# Patient Record
Sex: Male | Born: 1950
Health system: Southern US, Community
[De-identification: ages and names within clinical notes are randomized; demographics above are authoritative.]

## PROBLEM LIST (undated history)

## (undated) DIAGNOSIS — G4700 Insomnia, unspecified: Secondary | ICD-10-CM

## (undated) DIAGNOSIS — G4733 Obstructive sleep apnea (adult) (pediatric): Secondary | ICD-10-CM

## (undated) DIAGNOSIS — C61 Malignant neoplasm of prostate: Secondary | ICD-10-CM

## (undated) DIAGNOSIS — J418 Mixed simple and mucopurulent chronic bronchitis: Secondary | ICD-10-CM

## (undated) DIAGNOSIS — J449 Chronic obstructive pulmonary disease, unspecified: Secondary | ICD-10-CM

## (undated) DIAGNOSIS — G250 Essential tremor: Secondary | ICD-10-CM

## (undated) DIAGNOSIS — C801 Malignant (primary) neoplasm, unspecified: Secondary | ICD-10-CM

## (undated) DIAGNOSIS — N401 Enlarged prostate with lower urinary tract symptoms: Secondary | ICD-10-CM

## (undated) DIAGNOSIS — Z972 Presence of dental prosthetic device (complete) (partial): Secondary | ICD-10-CM

## (undated) DIAGNOSIS — J432 Centrilobular emphysema: Secondary | ICD-10-CM

## (undated) DIAGNOSIS — J189 Pneumonia, unspecified organism: Secondary | ICD-10-CM

## (undated) DIAGNOSIS — J3089 Other allergic rhinitis: Secondary | ICD-10-CM

## (undated) DIAGNOSIS — J4489 Other specified chronic obstructive pulmonary disease: Secondary | ICD-10-CM

## (undated) DIAGNOSIS — G5601 Carpal tunnel syndrome, right upper limb: Secondary | ICD-10-CM

## (undated) DIAGNOSIS — R0600 Dyspnea, unspecified: Secondary | ICD-10-CM

## (undated) DIAGNOSIS — I8390 Asymptomatic varicose veins of unspecified lower extremity: Secondary | ICD-10-CM

## (undated) DIAGNOSIS — K219 Gastro-esophageal reflux disease without esophagitis: Secondary | ICD-10-CM

## (undated) DIAGNOSIS — R0609 Other forms of dyspnea: Secondary | ICD-10-CM

## (undated) DIAGNOSIS — H269 Unspecified cataract: Secondary | ICD-10-CM

## (undated) DIAGNOSIS — J069 Acute upper respiratory infection, unspecified: Secondary | ICD-10-CM

## (undated) DIAGNOSIS — M1990 Unspecified osteoarthritis, unspecified site: Secondary | ICD-10-CM

## (undated) DIAGNOSIS — F419 Anxiety disorder, unspecified: Secondary | ICD-10-CM

## (undated) DIAGNOSIS — R918 Other nonspecific abnormal finding of lung field: Secondary | ICD-10-CM

## (undated) DIAGNOSIS — N138 Other obstructive and reflux uropathy: Secondary | ICD-10-CM

## (undated) DIAGNOSIS — N529 Male erectile dysfunction, unspecified: Secondary | ICD-10-CM

## (undated) DIAGNOSIS — F32A Depression, unspecified: Secondary | ICD-10-CM

## (undated) DIAGNOSIS — J45909 Unspecified asthma, uncomplicated: Secondary | ICD-10-CM

## (undated) HISTORY — DX: Acute upper respiratory infection, unspecified: J06.9

## (undated) HISTORY — DX: Carpal tunnel syndrome, right upper limb: G56.01

## (undated) HISTORY — DX: Gastro-esophageal reflux disease without esophagitis: K21.9

## (undated) HISTORY — PX: CARDIAC CATHETERIZATION: SHX172

## (undated) HISTORY — DX: Essential tremor: G25.0

## (undated) HISTORY — DX: Obstructive sleep apnea (adult) (pediatric): G47.33

## (undated) HISTORY — DX: Male erectile dysfunction, unspecified: N52.9

## (undated) HISTORY — DX: Insomnia, unspecified: G47.00

## (undated) HISTORY — PX: ANKLE SURGERY: SHX546

## (undated) HISTORY — DX: Unspecified asthma, uncomplicated: J45.909

## (undated) HISTORY — PX: ADENOIDECTOMY: SUR15

## (undated) HISTORY — DX: Chronic obstructive pulmonary disease, unspecified: J44.9

## (undated) HISTORY — PX: TONSILLECTOMY: SUR1361

## (undated) HISTORY — PX: COLONOSCOPY: SHX174

## (undated) HISTORY — PX: APPENDECTOMY: SHX54

## (undated) HISTORY — PX: TONSILLECTOMY AND ADENOIDECTOMY: SUR1326

## (undated) HISTORY — DX: Unspecified cataract: H26.9

---

## 2004-01-16 HISTORY — PX: VEIN SURGERY: SHX48

## 2005-01-15 HISTORY — PX: INGUINAL HERNIA REPAIR: SUR1180

## 2005-01-15 HISTORY — PX: HERNIA REPAIR: SHX51

## 2007-01-16 HISTORY — PX: VEIN SURGERY: SHX48

## 2012-01-16 HISTORY — PX: ROTATOR CUFF REPAIR: SHX139

## 2013-01-15 HISTORY — PX: APPENDECTOMY: SHX54

## 2014-01-15 HISTORY — PX: TOTAL SHOULDER REPLACEMENT: SUR1217

## 2014-01-15 HISTORY — PX: DEEP BRAIN STIMULATOR PLACEMENT: SHX608

## 2014-06-30 DIAGNOSIS — K5190 Ulcerative colitis, unspecified, without complications: Secondary | ICD-10-CM | POA: Insufficient documentation

## 2014-12-16 DIAGNOSIS — Z9689 Presence of other specified functional implants: Secondary | ICD-10-CM

## 2014-12-16 HISTORY — DX: Presence of other specified functional implants: Z96.89

## 2017-01-15 HISTORY — PX: CARPAL TUNNEL RELEASE: SHX101

## 2018-01-15 HISTORY — PX: CATARACT EXTRACTION: SUR2

## 2018-01-15 HISTORY — PX: CATARACT EXTRACTION W/ INTRAOCULAR LENS IMPLANT: SHX1309

## 2018-01-15 HISTORY — PX: ANKLE SURGERY: SHX546

## 2018-01-22 DIAGNOSIS — G4733 Obstructive sleep apnea (adult) (pediatric): Secondary | ICD-10-CM | POA: Diagnosis not present

## 2018-01-22 DIAGNOSIS — E669 Obesity, unspecified: Secondary | ICD-10-CM | POA: Diagnosis not present

## 2018-01-22 DIAGNOSIS — J45909 Unspecified asthma, uncomplicated: Secondary | ICD-10-CM | POA: Diagnosis not present

## 2018-01-22 DIAGNOSIS — J189 Pneumonia, unspecified organism: Secondary | ICD-10-CM | POA: Diagnosis not present

## 2018-01-28 DIAGNOSIS — S93491D Sprain of other ligament of right ankle, subsequent encounter: Secondary | ICD-10-CM | POA: Diagnosis not present

## 2018-02-07 DIAGNOSIS — G4733 Obstructive sleep apnea (adult) (pediatric): Secondary | ICD-10-CM | POA: Diagnosis not present

## 2018-02-07 DIAGNOSIS — G250 Essential tremor: Secondary | ICD-10-CM | POA: Diagnosis not present

## 2018-02-07 DIAGNOSIS — J45909 Unspecified asthma, uncomplicated: Secondary | ICD-10-CM | POA: Diagnosis not present

## 2018-02-07 DIAGNOSIS — N529 Male erectile dysfunction, unspecified: Secondary | ICD-10-CM | POA: Diagnosis not present

## 2018-02-07 DIAGNOSIS — J449 Chronic obstructive pulmonary disease, unspecified: Secondary | ICD-10-CM | POA: Diagnosis not present

## 2018-02-26 DIAGNOSIS — Z9889 Other specified postprocedural states: Secondary | ICD-10-CM | POA: Diagnosis not present

## 2018-02-26 DIAGNOSIS — M19011 Primary osteoarthritis, right shoulder: Secondary | ICD-10-CM | POA: Diagnosis not present

## 2018-03-04 DIAGNOSIS — G4733 Obstructive sleep apnea (adult) (pediatric): Secondary | ICD-10-CM | POA: Diagnosis not present

## 2018-03-10 DIAGNOSIS — G4733 Obstructive sleep apnea (adult) (pediatric): Secondary | ICD-10-CM | POA: Diagnosis not present

## 2018-03-11 DIAGNOSIS — Z9889 Other specified postprocedural states: Secondary | ICD-10-CM | POA: Diagnosis not present

## 2018-03-11 DIAGNOSIS — M19011 Primary osteoarthritis, right shoulder: Secondary | ICD-10-CM | POA: Diagnosis not present

## 2018-03-11 DIAGNOSIS — M75121 Complete rotator cuff tear or rupture of right shoulder, not specified as traumatic: Secondary | ICD-10-CM | POA: Diagnosis not present

## 2018-03-11 DIAGNOSIS — M25511 Pain in right shoulder: Secondary | ICD-10-CM | POA: Diagnosis not present

## 2018-03-14 DIAGNOSIS — H35363 Drusen (degenerative) of macula, bilateral: Secondary | ICD-10-CM | POA: Diagnosis not present

## 2018-03-14 DIAGNOSIS — H43393 Other vitreous opacities, bilateral: Secondary | ICD-10-CM | POA: Diagnosis not present

## 2018-03-14 DIAGNOSIS — H04123 Dry eye syndrome of bilateral lacrimal glands: Secondary | ICD-10-CM | POA: Diagnosis not present

## 2018-03-14 DIAGNOSIS — H2513 Age-related nuclear cataract, bilateral: Secondary | ICD-10-CM | POA: Diagnosis not present

## 2018-03-15 DIAGNOSIS — Z0100 Encounter for examination of eyes and vision without abnormal findings: Secondary | ICD-10-CM | POA: Diagnosis not present

## 2018-04-08 DIAGNOSIS — G4733 Obstructive sleep apnea (adult) (pediatric): Secondary | ICD-10-CM | POA: Diagnosis not present

## 2018-04-18 DIAGNOSIS — Z9689 Presence of other specified functional implants: Secondary | ICD-10-CM | POA: Diagnosis not present

## 2018-04-18 DIAGNOSIS — G250 Essential tremor: Secondary | ICD-10-CM | POA: Diagnosis not present

## 2018-05-08 DIAGNOSIS — G4733 Obstructive sleep apnea (adult) (pediatric): Secondary | ICD-10-CM | POA: Diagnosis not present

## 2018-05-09 DIAGNOSIS — G4733 Obstructive sleep apnea (adult) (pediatric): Secondary | ICD-10-CM | POA: Diagnosis not present

## 2018-05-13 DIAGNOSIS — G4733 Obstructive sleep apnea (adult) (pediatric): Secondary | ICD-10-CM | POA: Diagnosis not present

## 2018-05-13 DIAGNOSIS — J45909 Unspecified asthma, uncomplicated: Secondary | ICD-10-CM | POA: Diagnosis not present

## 2018-05-13 DIAGNOSIS — J189 Pneumonia, unspecified organism: Secondary | ICD-10-CM | POA: Diagnosis not present

## 2018-05-13 DIAGNOSIS — E669 Obesity, unspecified: Secondary | ICD-10-CM | POA: Diagnosis not present

## 2018-05-14 DIAGNOSIS — K648 Other hemorrhoids: Secondary | ICD-10-CM | POA: Diagnosis not present

## 2018-05-14 DIAGNOSIS — K625 Hemorrhage of anus and rectum: Secondary | ICD-10-CM | POA: Diagnosis not present

## 2018-05-14 DIAGNOSIS — Z8489 Family history of other specified conditions: Secondary | ICD-10-CM | POA: Diagnosis not present

## 2018-05-14 DIAGNOSIS — R69 Illness, unspecified: Secondary | ICD-10-CM | POA: Diagnosis not present

## 2018-05-14 DIAGNOSIS — K5100 Ulcerative (chronic) pancolitis without complications: Secondary | ICD-10-CM | POA: Diagnosis not present

## 2018-06-02 DIAGNOSIS — G4733 Obstructive sleep apnea (adult) (pediatric): Secondary | ICD-10-CM | POA: Diagnosis not present

## 2018-06-04 DIAGNOSIS — R490 Dysphonia: Secondary | ICD-10-CM | POA: Diagnosis not present

## 2018-06-04 DIAGNOSIS — K219 Gastro-esophageal reflux disease without esophagitis: Secondary | ICD-10-CM | POA: Diagnosis not present

## 2018-06-08 DIAGNOSIS — G4733 Obstructive sleep apnea (adult) (pediatric): Secondary | ICD-10-CM | POA: Diagnosis not present

## 2018-06-11 DIAGNOSIS — J310 Chronic rhinitis: Secondary | ICD-10-CM | POA: Diagnosis not present

## 2018-06-11 DIAGNOSIS — J45909 Unspecified asthma, uncomplicated: Secondary | ICD-10-CM | POA: Diagnosis not present

## 2018-06-11 DIAGNOSIS — R498 Other voice and resonance disorders: Secondary | ICD-10-CM | POA: Diagnosis not present

## 2018-06-11 DIAGNOSIS — J189 Pneumonia, unspecified organism: Secondary | ICD-10-CM | POA: Diagnosis not present

## 2018-06-11 DIAGNOSIS — G4733 Obstructive sleep apnea (adult) (pediatric): Secondary | ICD-10-CM | POA: Diagnosis not present

## 2018-06-11 DIAGNOSIS — R070 Pain in throat: Secondary | ICD-10-CM | POA: Diagnosis not present

## 2018-06-11 DIAGNOSIS — E669 Obesity, unspecified: Secondary | ICD-10-CM | POA: Diagnosis not present

## 2018-06-12 ENCOUNTER — Encounter: Payer: Self-pay | Admitting: Internal Medicine

## 2018-06-12 DIAGNOSIS — E785 Hyperlipidemia, unspecified: Secondary | ICD-10-CM | POA: Diagnosis not present

## 2018-06-12 DIAGNOSIS — Z01818 Encounter for other preprocedural examination: Secondary | ICD-10-CM | POA: Diagnosis not present

## 2018-06-12 DIAGNOSIS — R5381 Other malaise: Secondary | ICD-10-CM | POA: Diagnosis not present

## 2018-06-12 DIAGNOSIS — Z7901 Long term (current) use of anticoagulants: Secondary | ICD-10-CM | POA: Diagnosis not present

## 2018-06-12 DIAGNOSIS — R5382 Chronic fatigue, unspecified: Secondary | ICD-10-CM | POA: Diagnosis not present

## 2018-06-17 DIAGNOSIS — R490 Dysphonia: Secondary | ICD-10-CM | POA: Diagnosis not present

## 2018-06-17 DIAGNOSIS — M5030 Other cervical disc degeneration, unspecified cervical region: Secondary | ICD-10-CM | POA: Diagnosis not present

## 2018-06-19 DIAGNOSIS — R490 Dysphonia: Secondary | ICD-10-CM | POA: Diagnosis not present

## 2018-06-19 DIAGNOSIS — L922 Granuloma faciale [eosinophilic granuloma of skin]: Secondary | ICD-10-CM | POA: Diagnosis not present

## 2018-06-30 DIAGNOSIS — R490 Dysphonia: Secondary | ICD-10-CM | POA: Diagnosis not present

## 2018-06-30 DIAGNOSIS — J383 Other diseases of vocal cords: Secondary | ICD-10-CM | POA: Diagnosis not present

## 2018-06-30 DIAGNOSIS — K219 Gastro-esophageal reflux disease without esophagitis: Secondary | ICD-10-CM | POA: Diagnosis not present

## 2018-07-02 DIAGNOSIS — G4733 Obstructive sleep apnea (adult) (pediatric): Secondary | ICD-10-CM | POA: Diagnosis not present

## 2018-07-02 DIAGNOSIS — G4731 Primary central sleep apnea: Secondary | ICD-10-CM | POA: Diagnosis not present

## 2018-07-11 DIAGNOSIS — K219 Gastro-esophageal reflux disease without esophagitis: Secondary | ICD-10-CM | POA: Diagnosis not present

## 2018-07-11 DIAGNOSIS — J449 Chronic obstructive pulmonary disease, unspecified: Secondary | ICD-10-CM | POA: Diagnosis not present

## 2018-07-11 DIAGNOSIS — J45909 Unspecified asthma, uncomplicated: Secondary | ICD-10-CM | POA: Diagnosis not present

## 2018-07-11 DIAGNOSIS — R490 Dysphonia: Secondary | ICD-10-CM | POA: Diagnosis not present

## 2018-07-11 DIAGNOSIS — G250 Essential tremor: Secondary | ICD-10-CM | POA: Diagnosis not present

## 2018-07-11 DIAGNOSIS — G4733 Obstructive sleep apnea (adult) (pediatric): Secondary | ICD-10-CM | POA: Diagnosis not present

## 2018-07-22 DIAGNOSIS — Z9689 Presence of other specified functional implants: Secondary | ICD-10-CM | POA: Diagnosis not present

## 2018-07-22 DIAGNOSIS — G250 Essential tremor: Secondary | ICD-10-CM | POA: Diagnosis not present

## 2018-07-25 DIAGNOSIS — H9209 Otalgia, unspecified ear: Secondary | ICD-10-CM | POA: Diagnosis not present

## 2018-07-25 DIAGNOSIS — G250 Essential tremor: Secondary | ICD-10-CM | POA: Diagnosis not present

## 2018-07-28 DIAGNOSIS — J383 Other diseases of vocal cords: Secondary | ICD-10-CM | POA: Diagnosis not present

## 2018-07-28 DIAGNOSIS — K219 Gastro-esophageal reflux disease without esophagitis: Secondary | ICD-10-CM | POA: Diagnosis not present

## 2018-07-28 DIAGNOSIS — M26602 Left temporomandibular joint disorder, unspecified: Secondary | ICD-10-CM | POA: Diagnosis not present

## 2018-07-28 DIAGNOSIS — H9202 Otalgia, left ear: Secondary | ICD-10-CM | POA: Diagnosis not present

## 2018-07-28 DIAGNOSIS — R490 Dysphonia: Secondary | ICD-10-CM | POA: Diagnosis not present

## 2018-08-01 DIAGNOSIS — G4733 Obstructive sleep apnea (adult) (pediatric): Secondary | ICD-10-CM | POA: Diagnosis not present

## 2018-08-01 DIAGNOSIS — G4731 Primary central sleep apnea: Secondary | ICD-10-CM | POA: Diagnosis not present

## 2018-08-13 DIAGNOSIS — G4733 Obstructive sleep apnea (adult) (pediatric): Secondary | ICD-10-CM | POA: Diagnosis not present

## 2018-08-27 DIAGNOSIS — M65341 Trigger finger, right ring finger: Secondary | ICD-10-CM | POA: Diagnosis not present

## 2018-08-27 DIAGNOSIS — M1811 Unilateral primary osteoarthritis of first carpometacarpal joint, right hand: Secondary | ICD-10-CM | POA: Diagnosis not present

## 2018-08-27 DIAGNOSIS — M79641 Pain in right hand: Secondary | ICD-10-CM | POA: Diagnosis not present

## 2018-08-27 DIAGNOSIS — M65331 Trigger finger, right middle finger: Secondary | ICD-10-CM | POA: Diagnosis not present

## 2018-08-27 DIAGNOSIS — G5601 Carpal tunnel syndrome, right upper limb: Secondary | ICD-10-CM | POA: Diagnosis not present

## 2018-08-28 ENCOUNTER — Other Ambulatory Visit: Payer: Self-pay

## 2018-08-28 ENCOUNTER — Encounter: Payer: Self-pay | Admitting: Podiatry

## 2018-08-28 ENCOUNTER — Other Ambulatory Visit: Payer: Self-pay | Admitting: Podiatry

## 2018-08-28 ENCOUNTER — Ambulatory Visit: Payer: Medicare HMO | Admitting: Podiatry

## 2018-08-28 ENCOUNTER — Ambulatory Visit (INDEPENDENT_AMBULATORY_CARE_PROVIDER_SITE_OTHER): Payer: Medicare HMO

## 2018-08-28 VITALS — BP 112/56 | Temp 98.0°F

## 2018-08-28 DIAGNOSIS — M7671 Peroneal tendinitis, right leg: Secondary | ICD-10-CM | POA: Diagnosis not present

## 2018-08-28 DIAGNOSIS — B351 Tinea unguium: Secondary | ICD-10-CM | POA: Diagnosis not present

## 2018-08-28 DIAGNOSIS — M79675 Pain in left toe(s): Secondary | ICD-10-CM

## 2018-08-28 DIAGNOSIS — M25571 Pain in right ankle and joints of right foot: Secondary | ICD-10-CM

## 2018-08-28 DIAGNOSIS — M79674 Pain in right toe(s): Secondary | ICD-10-CM

## 2018-08-28 NOTE — Progress Notes (Signed)
Subjective:   Patient ID: Allen [name], male   DOB: 68 y.o.   MRN: [id number]   HPI Patient states he turned his right foot around 8 months ago and it is been bothering him since.  He did have an x-ray done at the time it was not fractured but it remains sore for the last 8 months and hard to walk and he also has thickened nailbeds 1-5 both feet that he cannot cut himself and they become difficult to take care of and manage.  Patient does not smoke likes to be active   Review of Systems  All other systems reviewed and are negative.       Objective:  Physical Exam Vitals signs and nursing note reviewed.  Constitutional:      Appearance: He is well-developed.  Pulmonary:     Effort: Pulmonary effort is normal.  Musculoskeletal: Normal range of motion.  Skin:    General: Skin is warm.  Neurological:     Mental Status: He is alert.     Neurovascular status found to be intact muscle strength was noted to be within normal limits with range of motion adequate.  Patient is noted to have inflammation pain of the peroneal tendon right at its insertion into the base of the fifth metatarsal with no indication of muscle dysfunction and has nail disease 1-5 both feet that are thick yellow and brittle     Assessment:  Peroneal tendinitis with slight possibility of a longitudinal split tear with mycotic nail infection 1-5 both feet     Plan:  H&P x-ray reviewed discussed both condition at today careful sheath injection administered right 3 mg Dexasone Kenalog 5 mg Xylocaine and went ahead and applied fascial brace to lift up the lateral side of the foot with instructions on usage and ice therapy and support therapy.  Debrided nailbeds 1-5 both feet and patient will be seen back by me 2 weeks or earlier and may require MRI immobilization if symptoms persist  X-rays were negative for signs of fracture of the fifth metatarsal right

## 2018-09-01 DIAGNOSIS — G4731 Primary central sleep apnea: Secondary | ICD-10-CM | POA: Diagnosis not present

## 2018-09-01 DIAGNOSIS — G4733 Obstructive sleep apnea (adult) (pediatric): Secondary | ICD-10-CM | POA: Diagnosis not present

## 2018-09-04 DIAGNOSIS — H2513 Age-related nuclear cataract, bilateral: Secondary | ICD-10-CM | POA: Diagnosis not present

## 2018-09-04 DIAGNOSIS — H04123 Dry eye syndrome of bilateral lacrimal glands: Secondary | ICD-10-CM | POA: Diagnosis not present

## 2018-09-04 DIAGNOSIS — H353132 Nonexudative age-related macular degeneration, bilateral, intermediate dry stage: Secondary | ICD-10-CM | POA: Diagnosis not present

## 2018-09-11 ENCOUNTER — Encounter: Payer: Self-pay | Admitting: Podiatry

## 2018-09-11 ENCOUNTER — Ambulatory Visit: Payer: Medicare HMO | Admitting: Podiatry

## 2018-09-11 ENCOUNTER — Other Ambulatory Visit: Payer: Self-pay

## 2018-09-11 DIAGNOSIS — M7671 Peroneal tendinitis, right leg: Secondary | ICD-10-CM | POA: Diagnosis not present

## 2018-09-11 DIAGNOSIS — M25571 Pain in right ankle and joints of right foot: Secondary | ICD-10-CM

## 2018-09-12 NOTE — Progress Notes (Signed)
Subjective:   Patient ID: Allen [name], male   DOB: 68 y.o.   MRN: [id number]   HPI Patient states that he still having discomfort and feels like the pain is radiating into his leg secondary to gait change.  States that he had mild relief for short period of time but is still quite sore making it difficult for him to be ambulatory   ROS      Objective:  Physical Exam  Neurovascular status intact with inflammation of the peroneal tendon in a more proximal fashion with the distal inflammation moderately improved but a lot of pain within the ankle and also into the medial crest of the tibia with no swelling associated with     Assessment:  What appears to be continued acute peroneal tendinitis right along with compensatory pain that is making ambulation difficult     Plan:  H&P reviewed condition and at this point I did recommend complete immobilization to allow for rest and I dispensed air fracture walker with all instructions on usage and he will begin wearing this ASAP.  I did do a careful injection of the lateral peroneal complex and advised on ice therapy and will be seen back to recheck

## 2018-09-13 DIAGNOSIS — G4733 Obstructive sleep apnea (adult) (pediatric): Secondary | ICD-10-CM | POA: Diagnosis not present

## 2018-09-15 DIAGNOSIS — H353132 Nonexudative age-related macular degeneration, bilateral, intermediate dry stage: Secondary | ICD-10-CM | POA: Diagnosis not present

## 2018-09-15 DIAGNOSIS — H2513 Age-related nuclear cataract, bilateral: Secondary | ICD-10-CM | POA: Diagnosis not present

## 2018-09-18 ENCOUNTER — Ambulatory Visit (INDEPENDENT_AMBULATORY_CARE_PROVIDER_SITE_OTHER): Payer: Medicare HMO | Admitting: Internal Medicine

## 2018-09-18 ENCOUNTER — Encounter: Payer: Self-pay | Admitting: Internal Medicine

## 2018-09-18 ENCOUNTER — Other Ambulatory Visit: Payer: Self-pay

## 2018-09-18 ENCOUNTER — Encounter: Payer: Self-pay | Admitting: Neurology

## 2018-09-18 DIAGNOSIS — G250 Essential tremor: Secondary | ICD-10-CM | POA: Diagnosis not present

## 2018-09-18 DIAGNOSIS — J449 Chronic obstructive pulmonary disease, unspecified: Secondary | ICD-10-CM

## 2018-09-18 DIAGNOSIS — Z23 Encounter for immunization: Secondary | ICD-10-CM

## 2018-09-18 DIAGNOSIS — G5601 Carpal tunnel syndrome, right upper limb: Secondary | ICD-10-CM | POA: Diagnosis not present

## 2018-09-18 DIAGNOSIS — G4733 Obstructive sleep apnea (adult) (pediatric): Secondary | ICD-10-CM

## 2018-09-18 DIAGNOSIS — N529 Male erectile dysfunction, unspecified: Secondary | ICD-10-CM

## 2018-09-18 DIAGNOSIS — K219 Gastro-esophageal reflux disease without esophagitis: Secondary | ICD-10-CM | POA: Diagnosis not present

## 2018-09-18 DIAGNOSIS — H269 Unspecified cataract: Secondary | ICD-10-CM

## 2018-09-18 DIAGNOSIS — G4700 Insomnia, unspecified: Secondary | ICD-10-CM | POA: Insufficient documentation

## 2018-09-18 MED ORDER — FAMOTIDINE 40 MG PO TABS: 40.0000 mg | ORAL_TABLET | Freq: Every day | ORAL | 1 refills | Status: DC

## 2018-09-18 MED ORDER — OMEPRAZOLE 40 MG PO CPDR: 40.0000 mg | DELAYED_RELEASE_CAPSULE | Freq: Every day | ORAL | 1 refills | Status: DC

## 2018-09-18 MED ORDER — ZOLPIDEM TARTRATE 1.75 MG SL SUBL: 1.7500 mg | SUBLINGUAL_TABLET | Freq: Every day | SUBLINGUAL | 1 refills | Status: DC

## 2018-09-18 MED ORDER — TRAZODONE HCL 100 MG PO TABS: 100.0000 mg | ORAL_TABLET | Freq: Every day | ORAL | 1 refills | Status: DC

## 2018-09-18 MED ORDER — TADALAFIL 10 MG PO TABS: 10.0000 mg | ORAL_TABLET | Freq: Every day | ORAL | 1 refills | Status: DC | PRN

## 2018-09-18 MED ORDER — MONTELUKAST SODIUM 10 MG PO TABS: 10.0000 mg | ORAL_TABLET | Freq: Every day | ORAL | 1 refills | Status: DC

## 2018-09-18 NOTE — Patient Instructions (Signed)
-  Nice seeing you today!!  -Schedule follow up appointment in 6 months.

## 2018-09-18 NOTE — Progress Notes (Signed)
New Patient Office Visit     CC/Reason for Visit: Establish care, discuss chronic conditions, medication refills Previous PCP: In [place] Last Visit: May 2020  HPI: Allen [name] is a 68 y.o. male who is coming in today for the above mentioned reasons.  He has just moved to [place] from [place] with his wife.  They need a new physician in the area.  His past medical history significant for:  1.  Essential tremor with a brain stimulator that he believes is not functioning.  He is requesting a neurology referral and mentions Dr. Doristine Devoid name as someone who was highly recommended to him.  2.  COPD/asthma on Singulair, no inhalers.  Requesting pulmonary referral.  3.  Obstructive sleep apnea on BiPAP, used to see a sleep physician who has him on trazodone and sublingual zolpidem.  4.  Right carpal tunnel syndrome has been seeing a local orthopedist and recently had several steroid injections in his hand which seems to help.  5.  Bilateral cataracts, is undergoing surgery by Dr. Katy Fitch the next week.  6.  GERD causing vocal cord dysfunction.  He has been taking famotidine in the morning and omeprazole in the evening.  He used to be a heavy smoker of 3 packs a day but quit in 1990 after an episode of double pneumonia at age 34.  He drinks 2 glasses of wine at night.  His family history significant for sister with rectal cancer and both parents with COPD.  He lost all of his medications in his mood and is requesting refills of everything today.   Past Medical/Surgical History: Past Medical History:  Diagnosis Date  . Bilateral cataracts   . COPD (chronic obstructive pulmonary disease) ([place])   . Essential tremor    with nerve stimulator  . GERD (gastroesophageal reflux disease)   . Insomnia   . OSA treated with BiPAP   . Right carpal tunnel syndrome     No past surgical history on file.  Social History:  reports that he has quit smoking. His smoking use  included cigarettes. He has a 60.00 pack-year smoking history. He has quit using smokeless tobacco. He reports current alcohol use of about 14.0 standard drinks of alcohol per week. No history on file for drug.  Allergies: No Known Allergies  Family History:  Family History  Problem Relation Age of Onset  . COPD Mother   . COPD Father   . Rectal cancer Sister      Current Outpatient Medications:  .  celecoxib (CELEBREX) 100 MG capsule, , Disp: , Rfl:  .  famotidine (PEPCID) 40 MG tablet, Take 1 tablet (40 mg total) by mouth daily., Disp: 90 tablet, Rfl: 1 .  fluticasone furoate-vilanterol (BREO ELLIPTA) 200-25 MCG/INH AEPB, Inhale 1 puff into the lungs daily., Disp: , Rfl:  .  montelukast (SINGULAIR) 10 MG tablet, Take 1 tablet (10 mg total) by mouth at bedtime., Disp: 90 tablet, Rfl: 1 .  nitroGLYCERIN (NITRO-DUR) 0.1 mg/hr patch, Place 0.1 mg onto the skin daily., Disp: , Rfl:  .  omeprazole (PRILOSEC) 40 MG capsule, Take 1 capsule (40 mg total) by mouth daily., Disp: 90 capsule, Rfl: 1 .  tadalafil (CIALIS) 10 MG tablet, Take 1 tablet (10 mg total) by mouth daily as needed for erectile dysfunction., Disp: 90 tablet, Rfl: 1 .  traZODone (DESYREL) 100 MG tablet, Take 1 tablet (100 mg total) by mouth at bedtime., Disp: 90 tablet, Rfl: 1 .  Zolpidem  Tartrate 1.75 MG SUBL, Place 1.75 mg under the tongue at bedtime., Disp: 90 tablet, Rfl: 1  Review of Systems:  Constitutional: Denies fever, chills, diaphoresis, appetite change and fatigue.  HEENT: Denies photophobia, eye pain, redness, hearing loss, ear pain, congestion, sore throat, rhinorrhea, sneezing, mouth sores, trouble swallowing, neck pain, neck stiffness and tinnitus.   Respiratory: Denies SOB, DOE, cough, chest tightness,  and wheezing.   Cardiovascular: Denies chest pain, palpitations and leg swelling.  Gastrointestinal: Denies nausea, vomiting, abdominal pain, diarrhea, constipation, blood in stool and abdominal distention.   Genitourinary: Denies dysuria, urgency, frequency, hematuria, flank pain and difficulty urinating.  Endocrine: Denies: hot or cold intolerance, sweats, changes in hair or nails, polyuria, polydipsia. Musculoskeletal: Denies myalgias, back pain, joint swelling, arthralgias and gait problem.  Skin: Denies pallor, rash and wound.  Neurological: Denies dizziness, seizures, syncope, weakness, light-headedness, numbness and headaches.  Hematological: Denies adenopathy. Easy bruising, personal or family bleeding history  Psychiatric/Behavioral: Denies suicidal ideation, mood changes, confusion, nervousness, sleep disturbance and agitation    Physical Exam: Vitals:   09/18/18 0948  BP: 120/78  Pulse: 83  Temp: 98.8 F (37.1 C)  TempSrc: Temporal  SpO2: 96%  Weight: 232 lb (105.2 kg)  Height: 6' (1.829 m)   Body mass index is 31.46 kg/m.  Constitutional: NAD, calm, comfortable Eyes: PERRL, lids and conjunctivae normal, wears corrective lenses ENMT: Mucous membranes are moist. Respiratory: clear to auscultation bilaterally, no wheezing, no crackles. Normal respiratory effort. No accessory muscle use.  Cardiovascular: Regular rate and rhythm, no murmurs / rubs / gallops. No extremity edema. 2+ pedal pulses. No carotid bruits.  Abdomen: no tenderness, no masses palpated. No hepatosplenomegaly. Bowel sounds positive.  Musculoskeletal: no clubbing / cyanosis. No joint deformity upper and lower extremities. Good ROM, no contractures. Normal muscle tone.  Neurologic: Grossly intact and nonfocal  psychiatric: Normal judgment and insight. Alert and oriented x 3. Normal mood.    Impression and Plan:  Gastroesophageal reflux disease without esophagitis -Will try taking omeprazole only and take off famotidine.  Chronic obstructive pulmonary disease, unspecified COPD type ([place])  - Plan: Ambulatory referral to Pulmonology -He does not appear to be on any inhalers.  OSA treated with BiPAP  -  Plan: Ambulatory referral to Pulmonology  Right carpal tunnel syndrome -Seems well controlled with steroid injections and wrist splinting at night.  Cataract of both eyes, unspecified cataract type -Undergoing surgery soon  Essential tremor  - Plan: Ambulatory referral to Neurology  Erectile dysfunction -Will refill Cialis.    Patient Instructions  -Nice seeing you today!!  -Schedule follow up appointment in 6 months.     Lelon Frohlich, MD [facility]

## 2018-09-26 DIAGNOSIS — H25811 Combined forms of age-related cataract, right eye: Secondary | ICD-10-CM | POA: Diagnosis not present

## 2018-09-30 ENCOUNTER — Encounter: Payer: Self-pay | Admitting: Internal Medicine

## 2018-10-02 ENCOUNTER — Encounter: Payer: Self-pay | Admitting: Podiatry

## 2018-10-02 ENCOUNTER — Ambulatory Visit: Payer: Medicare HMO | Admitting: Podiatry

## 2018-10-02 ENCOUNTER — Other Ambulatory Visit: Payer: Self-pay

## 2018-10-02 DIAGNOSIS — G4733 Obstructive sleep apnea (adult) (pediatric): Secondary | ICD-10-CM | POA: Diagnosis not present

## 2018-10-02 DIAGNOSIS — M7671 Peroneal tendinitis, right leg: Secondary | ICD-10-CM

## 2018-10-02 DIAGNOSIS — G4731 Primary central sleep apnea: Secondary | ICD-10-CM | POA: Diagnosis not present

## 2018-10-02 DIAGNOSIS — T148XXA Other injury of unspecified body region, initial encounter: Secondary | ICD-10-CM

## 2018-10-06 NOTE — Progress Notes (Signed)
Subjective:   Patient ID: Allen [name], male   DOB: 68 y.o.   MRN: [id number]   HPI Patient states that the boot made the foot hurt more and it only seemed to help for short period of time the brace seems to be making some difference and is also due to go to a neurologist and get a possible MRI if we can get permission as he does have a implant from the history of Parkinson's   ROS      Objective:  Physical Exam  Neurovascular status intact with patient having continued discomfort in the lateral side of the right foot around the peroneal tendon complex as it inserts into the base of the fifth metatarsal with continued inflammation moderate fluid buildup     Assessment:  Tendinitis right with possibility for torn tendon or other pathology     Plan:  Patient is going to see the neurologist first and we may be able to get an MRI depending on what they are recommendations are [place] to start physical therapy first to see if we can make a difference for this patient

## 2018-10-07 DIAGNOSIS — R69 Illness, unspecified: Secondary | ICD-10-CM | POA: Diagnosis not present

## 2018-10-08 DIAGNOSIS — G5601 Carpal tunnel syndrome, right upper limb: Secondary | ICD-10-CM | POA: Diagnosis not present

## 2018-10-08 DIAGNOSIS — M65341 Trigger finger, right ring finger: Secondary | ICD-10-CM | POA: Diagnosis not present

## 2018-10-08 DIAGNOSIS — M65331 Trigger finger, right middle finger: Secondary | ICD-10-CM | POA: Diagnosis not present

## 2018-10-08 DIAGNOSIS — M1811 Unilateral primary osteoarthritis of first carpometacarpal joint, right hand: Secondary | ICD-10-CM | POA: Diagnosis not present

## 2018-10-09 DIAGNOSIS — M79671 Pain in right foot: Secondary | ICD-10-CM | POA: Diagnosis not present

## 2018-10-09 DIAGNOSIS — M25571 Pain in right ankle and joints of right foot: Secondary | ICD-10-CM | POA: Diagnosis not present

## 2018-10-09 DIAGNOSIS — R262 Difficulty in walking, not elsewhere classified: Secondary | ICD-10-CM | POA: Diagnosis not present

## 2018-10-09 DIAGNOSIS — M25671 Stiffness of right ankle, not elsewhere classified: Secondary | ICD-10-CM | POA: Diagnosis not present

## 2018-10-09 DIAGNOSIS — M7671 Peroneal tendinitis, right leg: Secondary | ICD-10-CM | POA: Diagnosis not present

## 2018-10-09 DIAGNOSIS — M6281 Muscle weakness (generalized): Secondary | ICD-10-CM | POA: Diagnosis not present

## 2018-10-10 DIAGNOSIS — H25812 Combined forms of age-related cataract, left eye: Secondary | ICD-10-CM | POA: Diagnosis not present

## 2018-10-14 DIAGNOSIS — G4733 Obstructive sleep apnea (adult) (pediatric): Secondary | ICD-10-CM | POA: Diagnosis not present

## 2018-10-16 ENCOUNTER — Ambulatory Visit (INDEPENDENT_AMBULATORY_CARE_PROVIDER_SITE_OTHER): Payer: Medicare HMO | Admitting: Pulmonary Disease

## 2018-10-16 ENCOUNTER — Encounter: Payer: Self-pay | Admitting: Pulmonary Disease

## 2018-10-16 ENCOUNTER — Other Ambulatory Visit: Payer: Self-pay

## 2018-10-16 VITALS — BP 136/72 | HR 74 | Temp 97.6°F | Ht 70.0 in | Wt 233.2 lb

## 2018-10-16 DIAGNOSIS — G4733 Obstructive sleep apnea (adult) (pediatric): Secondary | ICD-10-CM

## 2018-10-16 DIAGNOSIS — J4489 Other specified chronic obstructive pulmonary disease: Secondary | ICD-10-CM

## 2018-10-16 DIAGNOSIS — J449 Chronic obstructive pulmonary disease, unspecified: Secondary | ICD-10-CM

## 2018-10-16 NOTE — Progress Notes (Signed)
[facility]  Chief Complaint  Patient presents with   Sleep Consult    Pt referred by Dr. Deniece Ree for COPD and OSA. Pt is currently on Bipap. Pt moved from [place]. Pt states the headgear is bothersome. Pt states he started out with a CPAP for 2 years and then changed to Bipap and has been on it for 4 months. Pt states his COPD causes DOE but denies cough. Pt states he occassional wheezing. Pt denies CP/tightness and f/c/s.     Constitutional:  BP 136/72 (BP Location: Right Arm, Cuff Size: Normal)    Pulse 74    Temp 97.6 F (36.4 C) (Skin)    Ht 5\' 10"  (1.778 m)    Wt 233 lb 3.2 oz (105.8 kg)    SpO2 98%    BMI 33.46 kg/m   Past Medical History:  Carpal tunnel, GERD, Tremor, ED, Cataracts  Brief Summary:  Allen [name] is a 68 y.o. male former smoker with COPD and obstructive sleep apnea.  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  After retirement he decided to move to [place], and did this about 2 months ago.  He was followed in [place] by pulmonary for 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and obstructive sleep apnea.  He was previously using anoro.  His pulmonologist felt he was still having wheezing and worried that his childhood asthma had returned.  He was changed to breo and eventually had does increased to 200/25.  He has also been using singulair.  He isn't sure how much this has helped, but hasn't been getting wheezing.  He isn't having cough, sputum or chest pain.  He does get winded with walking, but recovers after resting.  He had pneumonia in December 2019 after going on a cruise.  He quit smoking years ago.  He had a sleep study years ago.  He thinks his sleep apnea number was in the 60's.  He was tried on CPAP.  He needed very high pressure settings and tried different masks.  He wasn't able to be adequately controlled with CPAP.  He was switched to Bipap and has been doing better.  His main  issue with Bipap relates to his mask.  He has tremor and had deep brain stimulator inserted.  His mask straps ride over the top of the insertion site for the device and cause discomfort.  As a result he has to take the strap off after wearing for a few hours.  He was told that he had to wear a full face mask because he is a mouth breather.  He doesn't other have any trouble breathing through his nose.  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  Since he retired he tries going to bed at 10 pm.  He puts Bipap mask on, and watches TV.  He sleeps for about 4 to 5 hours, and then wakes up.  He then has trouble falling back to sleep.  He has low energy during the day, but doesn't need to take naps.  He has been using trazodone 100 to 150 mg nightly and this helps him fall asleep.  He has tried sublingual ambien if he has trouble falling back to sleep after waking up.   Physical Exam:   Appearance - well kempt   ENMT - clear nasal  mucosa, midline nasal  septum, no oral exudates, no LAN, trachea midline  Respiratory - normal chest wall, normal respiratory effort, no accessory muscle use, no wheeze/rales  CV - s1s2 regular rate and rhythm, no murmurs, no peripheral edema, radial pulses symmetric  GI - soft, non tender, no masses  Lymph - no adenopathy noted in neck and axillary areas  MSK - normal gait  Ext - no cyanosis, clubbing, or joint inflammation noted  Skin - no rashes, lesions, or ulcers  Neuro - normal strength, oriented x 3  Psych - normal mood and affect   Assessment/Plan:   COPD with asthma. - continue breo, singulair, and prn albuterol - will get copies of his imaging studies, lab tests and PFT from [place] and then determine if he should need adjustments to his regimen; specifically will need to determine if he should remain on ICS or switch to LABA/LAMA combination  Obstructive sleep apnea. - he reports compliance  with Bipap and benefit from therapy - he was previously tried on CPAP, but was not able to tolerate CPAP; he was tried on different pressure settings and mask types  Difficulty with full face CPAP mask. - he was told he had to use a full face mask because he is a "mouth breather"; I advised him that this is not accurate and he could try other mask types - main issue with current mask is that the straps put pressure on his deep brain stimulator insertion site - advised him to look up mask options on line - will arrange for DME company in [place] - will get copy of his sleep studies from [place]  Insomnia with sleep apnea. - continue trazodone with prn sublingual ambien for now - advised he could use trazodone 150 mg nightly if this higher dose works better and he isn't having side effects - discussed sleep hygiene and sleep restriction  Tremor. - he has appointment with Dr. Wells Guiles Tat with neurology scheduled   Patient Instructions  Will get copy of your medical records from [place] and arrange for home care company to set up Bipap supplies  Follow up in 3 weeks    Chesley Mires, MD [facility] Pager: [phone] 10/16/2018, 11:09 AM  Flow Sheet     Pulmonary tests:    Sleep tests:    Cardiac tests:     Review of Systems:  Constitutional: Negative for fever and unexpected weight change.  HENT: Negative for congestion, dental problem, ear pain, nosebleeds, postnasal drip, rhinorrhea, sinus pressure, sneezing, sore throat and trouble swallowing.   Eyes: Negative for redness and itching.  Respiratory: Positive for shortness of breath. Negative for cough, chest tightness and wheezing.   Cardiovascular: Negative for palpitations and leg swelling.  Gastrointestinal: Negative for nausea and vomiting.  Genitourinary: Negative for dysuria.  Musculoskeletal: Negative for joint swelling.  Skin: Negative for rash.  Neurological: Negative for  headaches.  Hematological: Does not bruise/bleed easily.  Psychiatric/Behavioral: Negative for dysphoric mood. The patient is not nervous/anxious.    Medications:   Allergies as of 10/16/2018   No Known Allergies     Medication List       Accurate as of October 16, 2018 11:09 AM. If you have any questions, ask your nurse or doctor.        Breo Ellipta 200-25 MCG/INH Aepb Generic drug: fluticasone furoate-vilanterol Inhale 1 puff into the lungs daily.   celecoxib 100 MG capsule Commonly known as: CELEBREX   montelukast 10 MG tablet  Commonly known as: SINGULAIR Take 1 tablet (10 mg total) by mouth at bedtime. What changed: when to take this   Nitro-Dur 0.1 mg/hr patch Generic drug: nitroGLYCERIN Place 0.1 mg onto the skin daily.   omeprazole 40 MG capsule Commonly known as: PRILOSEC Take 1 capsule (40 mg total) by mouth daily.   tadalafil 10 MG tablet Commonly known as: Cialis Take 1 tablet (10 mg total) by mouth daily as needed for erectile dysfunction.   traZODone 100 MG tablet Commonly known as: DESYREL Take 1 tablet (100 mg total) by mouth at bedtime.   Zolpidem Tartrate 1.75 MG Subl Place 1.75 mg under the tongue at bedtime.       Past Surgical History:  He  has a past surgical history that includes Hernia repair (Right, 2007); Cataract extraction (Bilateral, 2020); Vein Surgery (Bilateral, 2009); Rotator cuff repair (Right, 2014); Appendectomy (2015); Total shoulder replacement (Left, 2016); Deep brain stimulator placement (2016); and Carpal tunnel release (Left, 2019).  Family History:  His family history includes COPD in his father and mother; Rectal cancer in his sister.  Social History:  He  reports that he quit smoking about 29 years ago. His smoking use included cigarettes. He has a 60.00 pack-year smoking history. He has quit using smokeless tobacco. He reports current alcohol use of about 14.0 standard drinks of alcohol per week.

## 2018-10-16 NOTE — Patient Instructions (Signed)
Will get copy of your medical records from [place] and arrange for home care company to set up Bipap supplies  Follow up in 3 weeks

## 2018-10-16 NOTE — Progress Notes (Signed)
   Subjective:    Patient ID: Allen [name], male    DOB: Oct 13, 1950, 68 y.o.   MRN: [id number]  HPI    Review of Systems  Constitutional: Negative for fever and unexpected weight change.  HENT: Negative for congestion, dental problem, ear pain, nosebleeds, postnasal drip, rhinorrhea, sinus pressure, sneezing, sore throat and trouble swallowing.   Eyes: Negative for redness and itching.  Respiratory: Positive for shortness of breath. Negative for cough, chest tightness and wheezing.   Cardiovascular: Negative for palpitations and leg swelling.  Gastrointestinal: Negative for nausea and vomiting.  Genitourinary: Negative for dysuria.  Musculoskeletal: Negative for joint swelling.  Skin: Negative for rash.  Neurological: Negative for headaches.  Hematological: Does not bruise/bleed easily.  Psychiatric/Behavioral: Negative for dysphoric mood. The patient is not nervous/anxious.        Objective:   Physical Exam        Assessment & Plan:

## 2018-10-17 DIAGNOSIS — M6281 Muscle weakness (generalized): Secondary | ICD-10-CM | POA: Diagnosis not present

## 2018-10-17 DIAGNOSIS — M7671 Peroneal tendinitis, right leg: Secondary | ICD-10-CM | POA: Diagnosis not present

## 2018-10-17 DIAGNOSIS — M79671 Pain in right foot: Secondary | ICD-10-CM | POA: Diagnosis not present

## 2018-10-17 DIAGNOSIS — M25571 Pain in right ankle and joints of right foot: Secondary | ICD-10-CM | POA: Diagnosis not present

## 2018-10-17 DIAGNOSIS — M25671 Stiffness of right ankle, not elsewhere classified: Secondary | ICD-10-CM | POA: Diagnosis not present

## 2018-10-17 DIAGNOSIS — R262 Difficulty in walking, not elsewhere classified: Secondary | ICD-10-CM | POA: Diagnosis not present

## 2018-10-22 ENCOUNTER — Encounter: Payer: Self-pay | Admitting: General Surgery

## 2018-10-27 ENCOUNTER — Other Ambulatory Visit: Payer: Self-pay

## 2018-10-27 ENCOUNTER — Encounter: Payer: Self-pay | Admitting: Podiatry

## 2018-10-27 ENCOUNTER — Telehealth: Payer: Self-pay | Admitting: Neurology

## 2018-10-27 ENCOUNTER — Ambulatory Visit: Payer: Medicare HMO | Admitting: Podiatry

## 2018-10-27 ENCOUNTER — Encounter: Payer: Self-pay | Admitting: Internal Medicine

## 2018-10-27 DIAGNOSIS — R972 Elevated prostate specific antigen [PSA]: Secondary | ICD-10-CM

## 2018-10-27 DIAGNOSIS — M7671 Peroneal tendinitis, right leg: Secondary | ICD-10-CM | POA: Diagnosis not present

## 2018-10-27 DIAGNOSIS — L989 Disorder of the skin and subcutaneous tissue, unspecified: Secondary | ICD-10-CM

## 2018-10-27 DIAGNOSIS — N529 Male erectile dysfunction, unspecified: Secondary | ICD-10-CM

## 2018-10-27 DIAGNOSIS — G5601 Carpal tunnel syndrome, right upper limb: Secondary | ICD-10-CM | POA: Diagnosis not present

## 2018-10-27 DIAGNOSIS — T148XXA Other injury of unspecified body region, initial encounter: Secondary | ICD-10-CM

## 2018-10-27 NOTE — Progress Notes (Signed)
Subjective:   Patient ID: Allen [name], male   DOB: 68 y.o.   MRN: [id number]   HPI Patient presents stating the pain in my foot is getting worse and physical therapy has not been helpful   ROS      Objective:  Physical Exam  Neurovascular status intact with swelling along the peroneal tendon which is extending more proximal now after patient had head injury 10 months ago     Assessment:  Strong possibility for interstitial tear of the peroneal tendon right     Plan:  Reviewed his condition and my concerns for the possibility for interstitial tear or other pathology and we are either can to get CT scan or MRI depending on what his neurologist says on Thursday with his implant.  At this point boot usage ice therapy and I educated him on the possibility for surgery  X-rays were negative for signs of any bone changes appears to be all soft tissue

## 2018-10-27 NOTE — Telephone Encounter (Signed)
Pt has a NP appt with me later this week.  Looks like he has a DBS in place.  We have no records on him regarding tremor/DBS.  Could you call patient and find out the manufacturer of his DBS ([employer] vs [employer] vs [employer]).  Also, year implanted and if he has a rechargeable device?    Really need records before appt

## 2018-10-27 NOTE — Progress Notes (Addendum)
Subjective:   [name] was seen in consultation in the movement disorder clinic at the request of Allen [name], Est[place]*.  The evaluation is for tremor, s/p DBS implantation in [place] in 12/2014.  He has never had the battery changed on the device but states that the device is not working well.  It worked well for the first 2 years but seemed to lose efficacy.  He leaves the device off most of the time because he actually thinks tremor is worse with it on.  He only has a L DBS implantation.      Tremor started in childhood.    Tremor is most noticeable when eating soup, grabbing things.   There is a  family hx of tremor in his grandmother.    Affected by caffeine:  No. Affected by alcohol:  No. Affected by stress:  Yes.   Affected by fatigue:  No. Spills soup if on spoon:  Yes.   Spills glass of liquid if full:  No. Affects ADL's (tying shoes, brushing teeth, etc):  No.  Current/Previously tried tremor medications: sounds like he was previously on beta blocker but told not to take due to copd; ? primidone.  Asks me about zonisamide today.  Current medications that may exacerbate tremor:  n/a  Outside reports reviewed: historical medical records, lab reports, office notes and referral letter/letters.  No Known Allergies  Current Outpatient Medications  Medication Instructions  . celecoxib (CELEBREX) 100 MG capsule No dose, route, or frequency recorded.  . fluticasone furoate-vilanterol (BREO ELLIPTA) 200-25 MCG/INH AEPB 1 puff, Inhalation, Daily  . montelukast (SINGULAIR) 10 mg, Oral, Daily at bedtime  . nitroGLYCERIN (NITRO-DUR) 0.1 mg, Transdermal, Daily  . omeprazole (PRILOSEC) 40 mg, Oral, Daily  . tadalafil (CIALIS) 10 mg, Oral, Daily PRN  . traZODone (DESYREL) 100 mg, Oral, Daily at bedtime  . Zolpidem Tartrate 1.75 mg, Sublingual, Daily at bedtime    Past Medical History:  Diagnosis Date  . Bilateral cataracts   . COPD (chronic obstructive pulmonary disease) ([place])    . ED (erectile dysfunction)   . Essential tremor    with nerve stimulator  . GERD (gastroesophageal reflux disease)   . Insomnia   . OSA treated with BiPAP   . Right carpal tunnel syndrome     Past Surgical History:  Procedure Laterality Date  . APPENDECTOMY  2015  . CARPAL TUNNEL RELEASE Left 2019  . CATARACT EXTRACTION Bilateral 2020  . DEEP BRAIN STIMULATOR PLACEMENT  2016  . HERNIA REPAIR Right 2007  . ROTATOR CUFF REPAIR Right 2014  . TOTAL SHOULDER REPLACEMENT Left 2016  . VEIN SURGERY Bilateral 2009    Social History   Socioeconomic History  . Marital status: Married    Spouse name: Not on file  . Number of children: 3  . Years of education: Not on file  . Highest education level: Associate degree: occupational, Hotel manager, or vocational program  Occupational History  . Not on file  Social Needs  . Financial resource strain: Not on file  . Food insecurity    Worry: Not on file    Inability: Not on file  . Transportation needs    Medical: Not on file    Non-medical: Not on file  Tobacco Use  . Smoking status: Former Smoker    Packs/day: 3.00    Years: 20.00    Pack years: 60.00    Types: Cigarettes    Quit date: 01/15/1989    Years since quitting: 29.8  .  Smokeless tobacco: Former Network engineer and Sexual Activity  . Alcohol use: Yes    Alcohol/week: 14.0 standard drinks    Types: 14 Glasses of wine per week  . Drug use: Not on file  . Sexual activity: Yes  Lifestyle  . Physical activity    Days per week: Not on file    Minutes per session: Not on file  . Stress: Not on file  Relationships  . Social Herbalist on phone: Not on file    Gets together: Not on file    Attends religious service: Not on file    Active member of club or organization: Not on file    Attends meetings of clubs or organizations: Not on file    Relationship status: Not on file  . Intimate partner violence    Fear of current or ex partner: Not on file     Emotionally abused: Not on file    Physically abused: Not on file    Forced sexual activity: Not on file  Other Topics Concern  . Not on file  Social History Narrative  . Not on file    Family Status  Relation Name Status  . Mother  (Not Specified)  . Father  (Not Specified)  . Sister  (Not Specified)  . Child x3 Alive    Review of Systems Review of Systems  Constitutional: Negative.   HENT: Negative.   Eyes: Negative.   Respiratory: Positive for shortness of breath (with exertion).   Cardiovascular: Negative.   Gastrointestinal: Negative.   Genitourinary: Negative.   Musculoskeletal: Positive for back pain.  Skin: Negative.   Endo/Heme/Allergies: Negative.      Objective:   VITALS:   Vitals:   10/30/18 0818  BP: 131/86  Pulse: 78  SpO2: 99%  Weight: 244 lb 6.4 oz (110.9 kg)  Height: 5\' 8"  (1.727 m)   Gen:  Appears stated age and in NAD. HEENT:  Normocephalic, atraumatic. The mucous membranes are moist. The superficial temporal arteries are without ropiness or tenderness. Cardiovascular: Regular rate and rhythm. Lungs: Clear to auscultation bilaterally. Neck: There are no carotid bruits noted bilaterally.  NEUROLOGICAL:  Orientation:  The patient is alert and oriented x 3.  Recent and remote memory are intact.  Attention span and concentration are normal.  Able to name objects and repeat without trouble.  Fund of knowledge is appropriate Cranial nerves: There is good facial symmetry.  Extraocular muscles are intact and visual fields are full to confrontational testing. Speech is fluent and clear. Soft palate rises symmetrically and there is no tongue deviation. Hearing is intact to conversational tone. Tone: Tone is good throughout. Sensation: Sensation is intact to light touch and pinprick throughout (facial, trunk, extremities). Vibration is intact at the bilateral big toe. There is no extinction with double simultaneous stimulation. There is no sensory  dermatomal level identified. Coordination:  The patient has no dysdiadichokinesia or dysmetria. Motor: Strength is 5/5 in the bilateral upper and lower extremities.  Shoulder shrug is equal bilaterally.  There is no pronator drift.  There are no fasciculations noted. Gait and Station: The patient is wide-based with ambulation.  MOVEMENT EXAM: Tremor: Prior to programming today, the patient had at least moderate tremor in the right upper extremity.  It is worse when given a weight.  He brings in a spoon today, and has very much difficulty prior to programming with holding the spoon.  Following programming, the patient had no postural tremor.  He had no tremor with the weight.  He had very minimal to no tremor when he was holding his spoon.     Assessment/Plan:   1.  Essential Tremor  -Patient is status post DBS surgery on [place], [place] in 2016.  Patient thought he had good efficacy for 2 years, and then thought the device did not work anymore, so he basically has had the device off.  His device was reprogrammed today and he felt he did much better.  He did not have much tremor when he left today, but I did leave his old setting in the device, in case he wanted to go back to that.  -I have tried a few times to get records from his old physician, without success.  I will continue to try.  Addendum: I have reviewed notes from patient's prior physician.  Very few notes were received.  Last note was received and was dated July 22, 2018.  It appears that the patient has only been tried in monopolar setting, but the patient did tell me that this was not the first physician that he saw, nor was it the physician who implanted him.  Patient had previously tried primidone, propranolol, gabapentin.  Medical records indicate that he was told that he had worsening tremor because he was having "habituation as the patient does not turn stimulation off at night."  -It does sound like the patient was on a  beta-blocker and perhaps primidone.  He is not a beta-blocker candidate because of COPD.  -Patient asked me about Zonegran today.  I really do not think he would get much efficacy off Zonegran.  In addition, he really did seem to get efficacy off of reprogramming his device.  While he has some mild tremor in the left upper extremity (no DBS on the right), this really does not bother him.   2. Ankle pain  -Patient asked me if his device was MRI compatible.  Following his visit, I did call the DBS rep and they looked him up.  He is full body MRI compatible.  We will call him back and let him know.  He will need to go to [facility] for the MRI.  -I did tell the patient that he would need to turn off his DBS for the surgery if cautery was going to be used.  3.   I will plan on seeing the patient back in the next 6 months, sooner should new neurologic issues arise.  CC:  Allen [name], Rayford Halsted, MD

## 2018-10-27 NOTE — Telephone Encounter (Signed)
[facility] faxed request for medical records to Ronna Polio, MD of [place].  10/27/18  KLM

## 2018-10-28 ENCOUNTER — Other Ambulatory Visit: Payer: Self-pay | Admitting: Internal Medicine

## 2018-10-28 NOTE — Telephone Encounter (Signed)
Spoke with patient device manufacturer is [employer], devices placed back in 2016, devices is rechargeable. device place in chest. Pt was seen by Dr. Ronna Polio in [place], [place] phone fax# [phone] called to request records. Release is needed sent patient Medical record release to sign will fax to [place] office once received

## 2018-10-30 ENCOUNTER — Encounter: Payer: Self-pay | Admitting: Neurology

## 2018-10-30 ENCOUNTER — Telehealth: Payer: Self-pay | Admitting: Neurology

## 2018-10-30 ENCOUNTER — Ambulatory Visit (INDEPENDENT_AMBULATORY_CARE_PROVIDER_SITE_OTHER): Payer: Medicare HMO | Admitting: Neurology

## 2018-10-30 ENCOUNTER — Other Ambulatory Visit: Payer: Self-pay

## 2018-10-30 VITALS — BP 131/86 | HR 78 | Ht 68.0 in | Wt 244.4 lb

## 2018-10-30 DIAGNOSIS — M25571 Pain in right ankle and joints of right foot: Secondary | ICD-10-CM | POA: Diagnosis not present

## 2018-10-30 DIAGNOSIS — J449 Chronic obstructive pulmonary disease, unspecified: Secondary | ICD-10-CM

## 2018-10-30 DIAGNOSIS — G250 Essential tremor: Secondary | ICD-10-CM

## 2018-10-30 NOTE — Procedures (Signed)
DBS Programming was performed.    Manufacturer of DBS device: medtronic  Total time spent programming was 45 minutes.  Device was confirmed to be on.  Soft start was confirmed to be on.  Impedences were checked and were within normal limits.  Battery was checked and was determined to be functioning normally (2.89) and not near the end of life.  Final settings were as follows:  Group B (active when leaving)  Left brain electrode:     1-2+           ; Amplitude  3.3   V   ; Pulse width 90 microseconds;   Frequency   160   Hz.  Right brain electrode:     N/a  Group A:  Left brain electrode:     2-C+           ; Amplitude  2.1   V   ; Pulse width 60 microseconds;   Frequency   150   Hz.  Right brain electrode:    n/a

## 2018-10-30 NOTE — Telephone Encounter (Signed)
Let pt know that I talked with DBS rep.  His device is MRI compatible for the ankle but he would need to go to [facility] for the MRI where they have a special coil for it.  DBS will need to be off for the MRI and generally the DBS reps are present for those MRI's

## 2018-10-30 NOTE — Telephone Encounter (Signed)
Spoke with patient he was made aware of provider response.

## 2018-10-30 NOTE — Telephone Encounter (Signed)
[facility] received 6 pages of medical records from [place] faxed to [place] from [facility]. Sending to be scanned into Epic 10/30/18  [facility]

## 2018-10-31 ENCOUNTER — Telehealth: Payer: Self-pay | Admitting: *Deleted

## 2018-10-31 ENCOUNTER — Encounter: Payer: Self-pay | Admitting: Podiatry

## 2018-10-31 DIAGNOSIS — M25532 Pain in left wrist: Secondary | ICD-10-CM | POA: Diagnosis not present

## 2018-10-31 DIAGNOSIS — M65331 Trigger finger, right middle finger: Secondary | ICD-10-CM | POA: Diagnosis not present

## 2018-10-31 DIAGNOSIS — G5601 Carpal tunnel syndrome, right upper limb: Secondary | ICD-10-CM | POA: Diagnosis not present

## 2018-10-31 DIAGNOSIS — M79641 Pain in right hand: Secondary | ICD-10-CM | POA: Diagnosis not present

## 2018-10-31 DIAGNOSIS — M65341 Trigger finger, right ring finger: Secondary | ICD-10-CM | POA: Diagnosis not present

## 2018-10-31 DIAGNOSIS — M1811 Unilateral primary osteoarthritis of first carpometacarpal joint, right hand: Secondary | ICD-10-CM | POA: Diagnosis not present

## 2018-10-31 NOTE — Telephone Encounter (Signed)
Male caller states she is calling for pt, that Dr. Paulla Dolly had wanted an okay from pt's neurologist for MRI, and Dr. Ron Parker had okayed the MRI.

## 2018-10-31 NOTE — Telephone Encounter (Signed)
Left message informing pt that we had received a message from that phone number stating neurologist had okay the MRI, but for our records a written medical clearance and I would inform Dr. Paulla Dolly.

## 2018-11-01 DIAGNOSIS — G4733 Obstructive sleep apnea (adult) (pediatric): Secondary | ICD-10-CM | POA: Diagnosis not present

## 2018-11-01 DIAGNOSIS — G4731 Primary central sleep apnea: Secondary | ICD-10-CM | POA: Diagnosis not present

## 2018-11-05 ENCOUNTER — Telehealth: Payer: Self-pay | Admitting: General Surgery

## 2018-11-05 DIAGNOSIS — D485 Neoplasm of uncertain behavior of skin: Secondary | ICD-10-CM | POA: Diagnosis not present

## 2018-11-05 DIAGNOSIS — I781 Nevus, non-neoplastic: Secondary | ICD-10-CM | POA: Diagnosis not present

## 2018-11-05 NOTE — Telephone Encounter (Signed)
Called patient and asked if he can bring the SD card from his BIPAP machine based on my conversation with [facility], who did not have the sleep study in their records, when the referral stated confirmation was received they had it in hand in addition to fax confirmation as well. Patient will bring the machine with him to the appointment as he is not sure where the SD card is located in the machine itself.  Nothing further needed.

## 2018-11-06 ENCOUNTER — Telehealth: Payer: Self-pay | Admitting: Podiatry

## 2018-11-06 ENCOUNTER — Encounter: Payer: Self-pay | Admitting: Pulmonary Disease

## 2018-11-06 ENCOUNTER — Ambulatory Visit: Payer: Medicare HMO | Admitting: Pulmonary Disease

## 2018-11-06 ENCOUNTER — Telehealth: Payer: Self-pay | Admitting: Neurology

## 2018-11-06 ENCOUNTER — Other Ambulatory Visit: Payer: Self-pay

## 2018-11-06 VITALS — BP 128/74 | HR 77 | Temp 97.3°F | Ht 68.0 in | Wt 245.8 lb

## 2018-11-06 DIAGNOSIS — J4540 Moderate persistent asthma, uncomplicated: Secondary | ICD-10-CM | POA: Diagnosis not present

## 2018-11-06 DIAGNOSIS — G4733 Obstructive sleep apnea (adult) (pediatric): Secondary | ICD-10-CM | POA: Diagnosis not present

## 2018-11-06 DIAGNOSIS — D2339 Other benign neoplasm of skin of other parts of face: Secondary | ICD-10-CM | POA: Diagnosis not present

## 2018-11-06 MED ORDER — BREO ELLIPTA 200-25 MCG/INH IN AEPB: 1.0000 | INHALATION_SPRAY | Freq: Every day | RESPIRATORY_TRACT | 3 refills | Status: DC

## 2018-11-06 NOTE — Telephone Encounter (Signed)
I sent his doctor a staff message, but there is no such thing as "clearance" for surgery for essential tremor.  I've already told his doctor and the patient to turn off the dbs for the surgery.  Pt has a remote to do that.  I don't give medical clearance.  That will need to come from his medical doctor

## 2018-11-06 NOTE — Telephone Encounter (Signed)
Valerie from [facility] called and is needing to get Medical Clearance for this Patient to have an MRI. Her Direct Fax number is [phone]. Thank you

## 2018-11-06 NOTE — Telephone Encounter (Signed)
I told pt the fax number I gave was my direct fax and I had not received a medical clearance for him. I requested Dr. Doristine Devoid phone number. Dr. Doristine Devoid [phone].

## 2018-11-06 NOTE — Telephone Encounter (Signed)
Brandy can You call [facility] back and see note from Dr. Carles Collet

## 2018-11-06 NOTE — Progress Notes (Signed)
[facility]  Chief Complaint  Patient presents with  . OSA treated with BIPAP    Nasal mask is working better.    Constitutional:  BP 128/74 (BP Location: Left Arm, Patient Position: Sitting, Cuff Size: Normal)   Pulse 77   Temp (!) 97.3 F (36.3 C)   Ht 5\' 8"  (1.727 m)   Wt 245 lb 12.8 oz (111.5 kg)   SpO2 96% Comment: on room air  BMI 37.37 kg/m   Past Medical History:  Carpal tunnel, GERD, Tremor, ED, Cataracts  Brief Summary:  Allen [name] is a 68 y.o. male former smoker with asthma and obstructive sleep apnea.  Reviewed his medical records from [place].  PFT showed bronchodilator response, mild restriction and mild diffusion defect.  Not consistent with COPD.  Home sleep study showed severe OSA.  Breathing okay.  Gets winded when he walks up steps and hills.  Not having cough, wheeze, or sputum.  Feels like breo helps.  Activity limited due to joint issues.  Bipap helps.  Has been using nasal mask.  Has some leak.  Had to buy mask on his own until he was eligible for new mask from DME through insurance.  He is getting supplies from [employer].  Physical Exam:   Appearance - well kempt   ENMT - no sinus tenderness, no nasal discharge, no oral exudate  Neck - no masses, trachea midline, no thyromegaly, no elevation in JVP  Respiratory - normal appearance of chest wall, normal respiratory effort w/o accessory muscle use, no dullness on percussion, no wheezing or rales  CV - s1s2 regular rate and rhythm, no murmurs, no peripheral edema, radial pulses symmetric  GI - soft, non tender  Lymph - no adenopathy noted in neck and axillary areas  MSK - normal gait  Ext - no cyanosis, clubbing, or joint inflammation noted  Skin - no rashes, lesions, or ulcers  Neuro - normal strength, oriented x 3  Psych - normal mood and affect    Assessment/Plan:   Asthma. - his PFT was not consistent with COPD and in his medical records  from [place] it was mentioned he has asthma and no mention of COPD - continue breo, prn albuterol - will try to see how he does off of singulair  Obstructive sleep apnea. - he reports compliance with Bipap and benefit from therapy - he was previously tried on CPAP, but was not able to tolerate CPAP; he was tried on different pressure settings and mask types - continue auto Bipap  Insomnia with sleep apnea. - continue trazodone with prn sublingual ambien   Tremor. - followed by Dr. Carles Collet with neurology   Patient Instructions  Can try stopping singulair  Continue breo one puff daily  Call if you have trouble getting new CPAP mask from [facility]  Follow up in 6 months    Chesley Mires, MD [place] Pager: [phone] 11/06/2018, 9:28 AM  Flow Sheet     Pulmonary tests:  PFT 12/21/16 >> FEV1 2.40 (69%), FEV1% 74, TLC 4.95 (73%), DLCO 77%, +BD  Sleep tests:  HST 04/20/15 >> AHI 40 Auto Bipap 08/08/18 to 11/05/18 >> used on 59 of 90 nights with average 5 hrs 17 min.  Average AHI 3 with median Bipap 14/10 and 95 th Bipap 17/13 cm H2O  Medications:   Allergies as of 11/06/2018   No Known Allergies     Medication List       Accurate as  of November 06, 2018  9:28 AM. If you have any questions, ask your nurse or doctor.        STOP taking these medications   montelukast 10 MG tablet Commonly known as: SINGULAIR Stopped by: Chesley Mires, MD   Nitro-Dur 0.1 mg/hr patch Generic drug: nitroGLYCERIN Stopped by: Chesley Mires, MD     TAKE these medications   Breo Ellipta 200-25 MCG/INH Aepb Generic drug: fluticasone furoate-vilanterol Inhale 1 puff into the lungs daily. What changed: when to take this Changed by: Chesley Mires, MD   celecoxib 100 MG capsule Commonly known as: CELEBREX   omeprazole 40 MG capsule Commonly known as: PRILOSEC Take 1 capsule (40 mg total) by mouth daily.   tadalafil 10 MG tablet Commonly known as: Cialis Take 1  tablet (10 mg total) by mouth daily as needed for erectile dysfunction.   traZODone 100 MG tablet Commonly known as: DESYREL Take 1 tablet (100 mg total) by mouth at bedtime.   Zolpidem Tartrate 1.75 MG Subl Place 1.75 mg under the tongue at bedtime.       Past Surgical History:  He  has a past surgical history that includes Hernia repair (Right, 2007); Cataract extraction (Bilateral, 2020); Vein Surgery (Bilateral, 2009); Rotator cuff repair (Right, 2014); Appendectomy (2015); Total shoulder replacement (Left, 2016); Deep brain stimulator placement (Left, 2016); and Carpal tunnel release (Left, 2019).  Family History:  His family history includes Asthma in his father; COPD in his mother; Healthy in his child; Rectal cancer in his sister.  Social History:  He  reports that he quit smoking about 29 years ago. His smoking use included cigarettes. He has a 60.00 pack-year smoking history. He has quit using smokeless tobacco. He reports current alcohol use of about 14.0 standard drinks of alcohol per week.

## 2018-11-06 NOTE — Telephone Encounter (Signed)
I spoke with [facility] - Theadora Rama and informed that pt was expecting a medical clearance and gave my [phone] fax.

## 2018-11-06 NOTE — Patient Instructions (Signed)
Can try stopping singulair  Continue breo one puff daily  Call if you have trouble getting new CPAP mask from [employer]  Follow up in 6 months

## 2018-11-06 NOTE — Telephone Encounter (Signed)
Yes Ma'am I will call [facility]. Thank you

## 2018-11-06 NOTE — Telephone Encounter (Signed)
Dr. Carles Collet I seen you make a email, is there a note of clearance anywhere for this patient?

## 2018-11-06 NOTE — Telephone Encounter (Signed)
Pt was previously seen in the office and was supposed to be scheduled for an MRI but was waiting on clearance from his Neurologist.  Pt is stating that his neurologist sent the clearance over but he has not heard anything about the MRI being scheduled. Pt calling to follow up.

## 2018-11-07 ENCOUNTER — Telehealth: Payer: Self-pay | Admitting: *Deleted

## 2018-11-07 DIAGNOSIS — M25571 Pain in right ankle and joints of right foot: Secondary | ICD-10-CM

## 2018-11-07 DIAGNOSIS — Z01812 Encounter for preprocedural laboratory examination: Secondary | ICD-10-CM

## 2018-11-07 DIAGNOSIS — T148XXA Other injury of unspecified body region, initial encounter: Secondary | ICD-10-CM

## 2018-11-07 DIAGNOSIS — M7671 Peroneal tendinitis, right leg: Secondary | ICD-10-CM

## 2018-11-07 NOTE — Telephone Encounter (Signed)
[facility] - Brandy states Dr. Carles Collet had sent a message stating pt could turn off the Deep Brain Stimulator during surgical procedure, but she could not give medical clearance. I told Theadora Rama I did need instructions for MRI. Theadora Rama states she will call after discussing with the nurse or Dr. Carles Collet.

## 2018-11-07 NOTE — Telephone Encounter (Signed)
Sounds good. Thank you

## 2018-11-07 NOTE — Telephone Encounter (Signed)
Spoke with patient and he is going to call his PCP.Verbally understood

## 2018-11-07 NOTE — Telephone Encounter (Signed)
I have personally sent a staff message to their physician the day after his appt with me.  His DBS is MRI compatible but MRI has to be done at [facility] and DBS rep will need to be with him.  DBS rep is Mason Jim, phone [phone]

## 2018-11-07 NOTE — Telephone Encounter (Signed)
[facility] - Brandy states DBS is compatible for MRI but must be performed at [facility] with representative - Mason Jim [phone]. Brandy spoke with Dr. Carles Collet and CT with Contrast would be fine.

## 2018-11-07 NOTE — Telephone Encounter (Signed)
Could try ct with contrast 1st

## 2018-11-07 NOTE — Telephone Encounter (Signed)
I informed pt of Dr. Paulla Dolly orders for the CT with Contrast, and it would be pre-cert prior to [facility] scheduling. Pt states he is having hand surgery 11/13/2018 so would need to schedule after that. I told pt that would be fine. Orders to Gretta Arab, RN for pre-cert and faxed to [facility].

## 2018-11-07 NOTE — Telephone Encounter (Signed)
Pt's wife, Larita Fife they received a call from Dr. Doristine Devoid office and pt will have to have the MRI performed at [facility] with the [place] representative present.

## 2018-11-07 NOTE — Telephone Encounter (Signed)
Please advise 

## 2018-11-07 NOTE — Telephone Encounter (Signed)
I called [facility] this morning and spoke with Mateo Flow and I told her what Dr. Carles Collet said. She is still asking if he can have his MRI with his DBS?  Please advise. Thank you

## 2018-11-10 ENCOUNTER — Telehealth: Payer: Self-pay | Admitting: Neurology

## 2018-11-10 NOTE — Telephone Encounter (Signed)
Rec'd from [place] forwarded 6 pages to  [place]

## 2018-11-11 DIAGNOSIS — G4733 Obstructive sleep apnea (adult) (pediatric): Secondary | ICD-10-CM | POA: Diagnosis not present

## 2018-11-13 ENCOUNTER — Ambulatory Visit: Payer: Medicare HMO | Admitting: Podiatry

## 2018-11-13 DIAGNOSIS — M65341 Trigger finger, right ring finger: Secondary | ICD-10-CM | POA: Diagnosis not present

## 2018-11-13 DIAGNOSIS — G5601 Carpal tunnel syndrome, right upper limb: Secondary | ICD-10-CM | POA: Diagnosis not present

## 2018-11-13 DIAGNOSIS — M1811 Unilateral primary osteoarthritis of first carpometacarpal joint, right hand: Secondary | ICD-10-CM | POA: Diagnosis not present

## 2018-11-20 ENCOUNTER — Ambulatory Visit
Admission: RE | Admit: 2018-11-20 | Discharge: 2018-11-20 | Disposition: A | Discharge status: Home / Self Care | Payer: Medicare HMO | Source: Ambulatory Visit | Attending: Podiatry | Admitting: Podiatry

## 2018-11-20 DIAGNOSIS — M7671 Peroneal tendinitis, right leg: Secondary | ICD-10-CM

## 2018-11-20 DIAGNOSIS — M25571 Pain in right ankle and joints of right foot: Secondary | ICD-10-CM

## 2018-11-20 DIAGNOSIS — S86311A Strain of muscle(s) and tendon(s) of peroneal muscle group at lower leg level, right leg, initial encounter: Secondary | ICD-10-CM | POA: Diagnosis not present

## 2018-11-20 DIAGNOSIS — T148XXA Other injury of unspecified body region, initial encounter: Secondary | ICD-10-CM

## 2018-11-21 ENCOUNTER — Ambulatory Visit: Payer: Medicare HMO | Admitting: Podiatry

## 2018-11-25 ENCOUNTER — Other Ambulatory Visit: Payer: Medicare HMO

## 2018-11-25 DIAGNOSIS — M1811 Unilateral primary osteoarthritis of first carpometacarpal joint, right hand: Secondary | ICD-10-CM | POA: Diagnosis not present

## 2018-11-25 DIAGNOSIS — G5601 Carpal tunnel syndrome, right upper limb: Secondary | ICD-10-CM | POA: Diagnosis not present

## 2018-11-25 DIAGNOSIS — M65331 Trigger finger, right middle finger: Secondary | ICD-10-CM | POA: Diagnosis not present

## 2018-11-25 DIAGNOSIS — Z4789 Encounter for other orthopedic aftercare: Secondary | ICD-10-CM | POA: Diagnosis not present

## 2018-11-25 DIAGNOSIS — M79641 Pain in right hand: Secondary | ICD-10-CM | POA: Diagnosis not present

## 2018-11-25 DIAGNOSIS — M65341 Trigger finger, right ring finger: Secondary | ICD-10-CM | POA: Diagnosis not present

## 2018-11-25 DIAGNOSIS — M25532 Pain in left wrist: Secondary | ICD-10-CM | POA: Diagnosis not present

## 2018-11-26 DIAGNOSIS — L905 Scar conditions and fibrosis of skin: Secondary | ICD-10-CM | POA: Diagnosis not present

## 2018-11-26 DIAGNOSIS — D485 Neoplasm of uncertain behavior of skin: Secondary | ICD-10-CM | POA: Diagnosis not present

## 2018-11-27 ENCOUNTER — Encounter: Payer: Self-pay | Admitting: Podiatry

## 2018-11-27 ENCOUNTER — Ambulatory Visit: Payer: Medicare HMO | Admitting: Podiatry

## 2018-11-27 ENCOUNTER — Other Ambulatory Visit: Payer: Self-pay

## 2018-11-27 DIAGNOSIS — M7671 Peroneal tendinitis, right leg: Secondary | ICD-10-CM

## 2018-11-27 DIAGNOSIS — T148XXA Other injury of unspecified body region, initial encounter: Secondary | ICD-10-CM

## 2018-11-27 NOTE — Progress Notes (Signed)
Subjective:   Patient ID: Allen [name], male   DOB: 68 y.o.   MRN: [id number]   HPI Patient states he is here to review his CT scan and he states he still having a lot of pain in his ankle right and just had hand surgery.  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  I am referring him to Dr. March Rummage for surgical consideration and he wants surgery done the first week of December and will be scheduled for this procedure.  I educated him on it today and went over the fact there will be a period of nonweightbearing postoperatively

## 2018-12-01 DIAGNOSIS — M79641 Pain in right hand: Secondary | ICD-10-CM | POA: Diagnosis not present

## 2018-12-02 DIAGNOSIS — G4733 Obstructive sleep apnea (adult) (pediatric): Secondary | ICD-10-CM | POA: Diagnosis not present

## 2018-12-02 DIAGNOSIS — G4731 Primary central sleep apnea: Secondary | ICD-10-CM | POA: Diagnosis not present

## 2018-12-03 DIAGNOSIS — L0100 Impetigo, unspecified: Secondary | ICD-10-CM | POA: Diagnosis not present

## 2018-12-04 DIAGNOSIS — Z125 Encounter for screening for malignant neoplasm of prostate: Secondary | ICD-10-CM | POA: Diagnosis not present

## 2018-12-04 DIAGNOSIS — N5201 Erectile dysfunction due to arterial insufficiency: Secondary | ICD-10-CM | POA: Diagnosis not present

## 2018-12-05 ENCOUNTER — Ambulatory Visit: Payer: Medicare HMO | Admitting: Podiatry

## 2018-12-05 ENCOUNTER — Other Ambulatory Visit: Payer: Self-pay

## 2018-12-05 DIAGNOSIS — G8929 Other chronic pain: Secondary | ICD-10-CM | POA: Diagnosis not present

## 2018-12-05 DIAGNOSIS — M7671 Peroneal tendinitis, right leg: Secondary | ICD-10-CM

## 2018-12-05 DIAGNOSIS — T148XXA Other injury of unspecified body region, initial encounter: Secondary | ICD-10-CM

## 2018-12-05 DIAGNOSIS — M25571 Pain in right ankle and joints of right foot: Secondary | ICD-10-CM | POA: Diagnosis not present

## 2018-12-05 NOTE — Patient Instructions (Signed)
Pre-Operative Instructions  Congratulations, you have decided to take an important step towards improving your quality of life.  You can be assured that the doctors and staff at [facility] will be with you every step of the way.  Here are some important things you should know:  1. Plan to be at the surgery center/hospital at least 1 (one) hour prior to your scheduled time, unless otherwise directed by the surgical center/hospital staff.  You must have a responsible adult accompany you, remain during the surgery and drive you home.  Make sure you have directions to the surgical center/hospital to ensure you arrive on time. 2. If you are having surgery at [facility] or [facility], you will need a copy of your medical history and physical form from your family physician within one month prior to the date of surgery. We will give you a form for your primary physician to complete.  3. We make every effort to accommodate the date you request for surgery.  However, there are times where surgery dates or times have to be moved.  We will contact you as soon as possible if a change in schedule is required.   4. No aspirin/ibuprofen for one week before surgery.  If you are on aspirin, any non-steroidal anti-inflammatory medications (Mobic, Aleve, Ibuprofen) should not be taken seven (7) days prior to your surgery.  You make take Tylenol for pain prior to surgery.  5. Medications - If you are taking daily heart and blood pressure medications, seizure, reflux, allergy, asthma, anxiety, pain or diabetes medications, make sure you notify the surgery center/hospital before the day of surgery so they can tell you which medications you should take or avoid the day of surgery. 6. No food or drink after midnight the night before surgery unless directed otherwise by surgical center/hospital staff. 7. No alcoholic beverages 24-hours prior to surgery.  No smoking 24-hours prior or 24-hours after  surgery. 8. Wear loose pants or shorts. They should be loose enough to fit over bandages, boots, and casts. 9. Don't wear slip-on shoes. Sneakers are preferred. 10. Bring your boot with you to the surgery center/hospital.  Also bring crutches or a walker if your physician has prescribed it for you.  If you do not have this equipment, it will be provided for you after surgery. 11. If you have not been contacted by the surgery center/hospital by the day before your surgery, call to confirm the date and time of your surgery. 12. Leave-time from work may vary depending on the type of surgery you have.  Appropriate arrangements should be made prior to surgery with your employer. 13. Prescriptions will be provided immediately following surgery by your doctor.  Fill these as soon as possible after surgery and take the medication as directed. Pain medications will not be refilled on weekends and must be approved by the doctor. 14. Remove nail polish on the operative foot and avoid getting pedicures prior to surgery. 15. Wash the night before surgery.  The night before surgery wash the foot and leg well with water and the antibacterial soap provided. Be sure to pay special attention to beneath the toenails and in between the toes.  Wash for at least three (3) minutes. Rinse thoroughly with water and dry well with a towel.  Perform this wash unless told not to do so by your physician.  Enclosed: 1 Ice pack (please put in freezer the night before surgery)   1 Hibiclens skin cleaner     Pre-op instructions  If you have any questions regarding the instructions, please do not hesitate to call our office.  [place]: [address], [place], [place] [address] -- [phone]  [place]: [address] [place], [place] [address] -- [phone]  [place]: [address]  [place],  [address] -- [phone]   Website: [email or web address] 

## 2018-12-08 ENCOUNTER — Other Ambulatory Visit: Payer: Self-pay | Admitting: *Deleted

## 2018-12-08 DIAGNOSIS — S86311A Strain of muscle(s) and tendon(s) of peroneal muscle group at lower leg level, right leg, initial encounter: Secondary | ICD-10-CM

## 2018-12-08 DIAGNOSIS — S86311D Strain of muscle(s) and tendon(s) of peroneal muscle group at lower leg level, right leg, subsequent encounter: Secondary | ICD-10-CM

## 2018-12-08 DIAGNOSIS — T148XXA Other injury of unspecified body region, initial encounter: Secondary | ICD-10-CM

## 2018-12-08 NOTE — Progress Notes (Signed)
Per Dr. March Rummage, I put in orders for a manual wheel chair, bedside commode, and for home health care.  I contacted Jolee Ewing and Patsy [place] / Jiles Crocker for assistance in getting the patient these items and service.

## 2018-12-09 DIAGNOSIS — M79641 Pain in right hand: Secondary | ICD-10-CM | POA: Diagnosis not present

## 2018-12-10 ENCOUNTER — Telehealth: Payer: Self-pay | Admitting: *Deleted

## 2018-12-10 NOTE — Telephone Encounter (Signed)
DOS 12/24/2018 REPAIR OF PERONEAL TENDON RIGHT FOOT - 28086  [employer]: Eligibility Date -    Co-Payment - Surgical  In [place] INCLUDED IN OOP  $200.00 Visit   Co-Insurance - Surgical  In [place]  SPU Surgery  0 %   Deductible - Health Benefit Plan Coverage  Network Not Applicable Individual  Plan / Coverage Date Jan 15, 2018 - Jan 15, 2019  Benefit Date Jan 15, 2018 - Jan 15, 2019 $0.00 Calendar Year  - $0.00  Year to Date $0.00   Out of Pocket (Stop Loss) - Health Benefit Plan Coverage  In Network Individual  Plan / Coverage Date Jan 15, 2018 - Jan 15, 2019  Benefit Date Jan 15, 2018 - Jan 15, 2019 $4,200.00   In Network Individual  [phone]  Remaining

## 2018-12-12 DIAGNOSIS — G4733 Obstructive sleep apnea (adult) (pediatric): Secondary | ICD-10-CM | POA: Diagnosis not present

## 2018-12-17 DIAGNOSIS — D485 Neoplasm of uncertain behavior of skin: Secondary | ICD-10-CM | POA: Diagnosis not present

## 2018-12-19 ENCOUNTER — Other Ambulatory Visit: Payer: Self-pay | Admitting: Podiatry

## 2018-12-19 NOTE — Addendum Note (Signed)
Addended by: Lolita Rieger on: 12/19/2018 12:01 PM   Modules accepted: Orders

## 2018-12-22 DIAGNOSIS — M79641 Pain in right hand: Secondary | ICD-10-CM | POA: Diagnosis not present

## 2018-12-22 DIAGNOSIS — D485 Neoplasm of uncertain behavior of skin: Secondary | ICD-10-CM | POA: Diagnosis not present

## 2018-12-22 NOTE — Progress Notes (Signed)
Subjective:  Patient ID: Allen [name], male    DOB: [birth year],  MRN: [id number]  No chief complaint on file.   68 y.o. male presents with the above complaint. Here for surgical consult for tendon tear to the right ankle.   Review of Systems: Negative except as noted in the HPI. Denies N/V/F/Ch.  Past Medical History:  Diagnosis Date  . Asthma   . Bilateral cataracts   . ED (erectile dysfunction)   . Essential tremor    with nerve stimulator  . GERD (gastroesophageal reflux disease)   . Insomnia   . OSA treated with BiPAP   . Right carpal tunnel syndrome     Current Outpatient Medications:  .  fluticasone furoate-vilanterol (BREO ELLIPTA) 200-25 MCG/INH AEPB, Inhale 1 puff into the lungs daily., Disp: 90 each, Rfl: 3 .  tadalafil (CIALIS) 10 MG tablet, Take 1 tablet (10 mg total) by mouth daily as needed for erectile dysfunction., Disp: 90 tablet, Rfl: 1 .  traZODone (DESYREL) 100 MG tablet, Take 1 tablet (100 mg total) by mouth at bedtime., Disp: 90 tablet, Rfl: 1 .  Zolpidem Tartrate 1.75 MG SUBL, Place 1.75 mg under the tongue at bedtime., Disp: 90 tablet, Rfl: 1  Social History   Tobacco Use  Smoking Status Former Smoker  . Packs/day: 3.00  . Years: 20.00  . Pack years: 60.00  . Types: Cigarettes  . Quit date: 01/15/1989  . Years since quitting: 29.9  Smokeless Tobacco Former User    No Known Allergies Objective:  There were no vitals filed for this visit. There is no height or weight on file to calculate BMI. Constitutional Well developed. Well nourished.  Vascular Dorsalis pedis pulses palpable bilaterally. Posterior tibial pulses palpable bilaterally. Capillary refill normal to all digits.  No cyanosis or clubbing noted. Pedal hair growth normal.  Neurologic Normal speech. Oriented to person, place, and time. Epicritic sensation to light touch grossly present bilaterally.  Dermatologic Nails well groomed and normal in appearance. No open wounds. No  skin lesions.  Orthopedic: POP right peroneal tendons, POP lateral ankle at peroneal tubercle, pain with inv/ev.   Radiographs: none  CT Reviewed: Longitudinal split tear of the peroneus longus tendon as described with tenosynovitis of the peroneal tendons.  Prominent peroneal tubercle and prominent posterolateral aspect of the lateral malleolus, both of which could contribute to the peroneal tendon abnormalities. Assessment:   1. Peroneal tendinitis of right lower extremity   2. Torn tendon   3. Chronic pain of right ankle    Plan:  Patient was evaluated and treated and all questions answered.  Right Pearoneal Tendon Tear -Discussed surgical plan with patient -Patient has failed all conservative therapy and wishes to proceed with surgical intervention. All risks, benefits, and alternatives discussed with patient. No guarantees given. Consent reviewed and signed by patient. -Planned procedures: right peroneal tendon repair. -Order bedside commode. Patient will need hte beside commode to being non-weightbearing from foot surgery.  -Order wheelchair. Patient suffers from torn peroneal tendon which impairs their ability to perform daily activities like ADLs in the home. Patient has mobility limitations which cannot be resolved with a cane, crutch, or walker. A standard manual wheelchair will allow the patient to safely perform daily activities. The patient can safely propel the standard manual wheelchair in the home or has a caregiver who can assist at all times. Length of need four to six weeks. Accessories: elevating leg rests (ELRs), wheel locks, extensions and anti-tippers. -Order HHC for post-op care.  Return if symptoms worsen or fail to improve.

## 2018-12-23 DIAGNOSIS — M65331 Trigger finger, right middle finger: Secondary | ICD-10-CM | POA: Diagnosis not present

## 2018-12-23 DIAGNOSIS — G4733 Obstructive sleep apnea (adult) (pediatric): Secondary | ICD-10-CM | POA: Diagnosis not present

## 2018-12-23 DIAGNOSIS — M79641 Pain in right hand: Secondary | ICD-10-CM | POA: Diagnosis not present

## 2018-12-23 DIAGNOSIS — G5601 Carpal tunnel syndrome, right upper limb: Secondary | ICD-10-CM | POA: Diagnosis not present

## 2018-12-23 DIAGNOSIS — M25532 Pain in left wrist: Secondary | ICD-10-CM | POA: Diagnosis not present

## 2018-12-23 DIAGNOSIS — M1811 Unilateral primary osteoarthritis of first carpometacarpal joint, right hand: Secondary | ICD-10-CM | POA: Diagnosis not present

## 2018-12-23 DIAGNOSIS — T148XXA Other injury of unspecified body region, initial encounter: Secondary | ICD-10-CM | POA: Diagnosis not present

## 2018-12-23 DIAGNOSIS — S86311A Strain of muscle(s) and tendon(s) of peroneal muscle group at lower leg level, right leg, initial encounter: Secondary | ICD-10-CM | POA: Diagnosis not present

## 2018-12-23 DIAGNOSIS — M65341 Trigger finger, right ring finger: Secondary | ICD-10-CM | POA: Diagnosis not present

## 2018-12-23 DIAGNOSIS — Z4789 Encounter for other orthopedic aftercare: Secondary | ICD-10-CM | POA: Diagnosis not present

## 2018-12-23 DIAGNOSIS — S86311D Strain of muscle(s) and tendon(s) of peroneal muscle group at lower leg level, right leg, subsequent encounter: Secondary | ICD-10-CM | POA: Diagnosis not present

## 2018-12-24 ENCOUNTER — Encounter: Payer: Self-pay | Admitting: Podiatry

## 2018-12-24 ENCOUNTER — Other Ambulatory Visit: Payer: Self-pay | Admitting: Podiatry

## 2018-12-24 DIAGNOSIS — M7671 Peroneal tendinitis, right leg: Secondary | ICD-10-CM | POA: Diagnosis not present

## 2018-12-24 DIAGNOSIS — M66871 Spontaneous rupture of other tendons, right ankle and foot: Secondary | ICD-10-CM | POA: Diagnosis not present

## 2018-12-24 DIAGNOSIS — K219 Gastro-esophageal reflux disease without esophagitis: Secondary | ICD-10-CM | POA: Diagnosis not present

## 2018-12-24 DIAGNOSIS — M25571 Pain in right ankle and joints of right foot: Secondary | ICD-10-CM | POA: Diagnosis not present

## 2018-12-24 MED ORDER — OXYCODONE-ACETAMINOPHEN 10-325 MG PO TABS: 1.0000 | ORAL_TABLET | ORAL | 0 refills | Status: DC | PRN

## 2018-12-24 MED ORDER — CLINDAMYCIN HCL 150 MG PO CAPS: 150.0000 mg | ORAL_CAPSULE | Freq: Two times a day (BID) | ORAL | 0 refills | Status: DC

## 2018-12-24 MED ORDER — ONDANSETRON HCL 4 MG PO TABS: 4.0000 mg | ORAL_TABLET | Freq: Three times a day (TID) | ORAL | 0 refills | Status: DC | PRN

## 2018-12-24 NOTE — Progress Notes (Signed)
Rx sent to pharmacy for outpatient surgery. °

## 2018-12-25 ENCOUNTER — Telehealth: Payer: Self-pay

## 2018-12-25 NOTE — Telephone Encounter (Signed)
POST OP CALL-    1) General condition stated by the patient:   2) Is the pt having pain? Still numb  3) Pain score:   4) Has the pt taken Rx'd pain medication, regularly or PRN?   5) Is the pain medication giving relief?   6) Any fever, chills, nausea, or vomiting, shortness of breath or tightness in calf? None  7) Is the bandage clean, dry and intact? Yes  8) Is there excessive tightness, bleeding or drainage coming through the bandage? None  9) Did you understand all of the post op instruction sheet given?  10) Any questions or concerns regarding post op care/recovery? Pt states a nurse at the surgical center advised him that he could remove his boot at night so long as he had pillows around his foot. I advised the Pt not to do this but instead to keep his boot on at all times to ensure proper healing.    Confirmed POV appointment with patient

## 2018-12-26 DIAGNOSIS — K219 Gastro-esophageal reflux disease without esophagitis: Secondary | ICD-10-CM | POA: Diagnosis not present

## 2018-12-26 DIAGNOSIS — G4733 Obstructive sleep apnea (adult) (pediatric): Secondary | ICD-10-CM | POA: Diagnosis not present

## 2018-12-26 DIAGNOSIS — Z9682 Presence of neurostimulator: Secondary | ICD-10-CM | POA: Diagnosis not present

## 2018-12-26 DIAGNOSIS — S96811D Strain of other specified muscles and tendons at ankle and foot level, right foot, subsequent encounter: Secondary | ICD-10-CM | POA: Diagnosis not present

## 2018-12-26 DIAGNOSIS — G250 Essential tremor: Secondary | ICD-10-CM | POA: Diagnosis not present

## 2018-12-26 DIAGNOSIS — Z9181 History of falling: Secondary | ICD-10-CM | POA: Diagnosis not present

## 2018-12-26 DIAGNOSIS — G4700 Insomnia, unspecified: Secondary | ICD-10-CM | POA: Diagnosis not present

## 2018-12-26 DIAGNOSIS — J449 Chronic obstructive pulmonary disease, unspecified: Secondary | ICD-10-CM | POA: Diagnosis not present

## 2018-12-29 ENCOUNTER — Telehealth: Payer: Self-pay | Admitting: *Deleted

## 2018-12-29 DIAGNOSIS — Z9682 Presence of neurostimulator: Secondary | ICD-10-CM | POA: Diagnosis not present

## 2018-12-29 DIAGNOSIS — Z9181 History of falling: Secondary | ICD-10-CM | POA: Diagnosis not present

## 2018-12-29 DIAGNOSIS — G4700 Insomnia, unspecified: Secondary | ICD-10-CM | POA: Diagnosis not present

## 2018-12-29 DIAGNOSIS — G250 Essential tremor: Secondary | ICD-10-CM | POA: Diagnosis not present

## 2018-12-29 DIAGNOSIS — K219 Gastro-esophageal reflux disease without esophagitis: Secondary | ICD-10-CM | POA: Diagnosis not present

## 2018-12-29 DIAGNOSIS — G4733 Obstructive sleep apnea (adult) (pediatric): Secondary | ICD-10-CM | POA: Diagnosis not present

## 2018-12-29 DIAGNOSIS — J449 Chronic obstructive pulmonary disease, unspecified: Secondary | ICD-10-CM | POA: Diagnosis not present

## 2018-12-29 DIAGNOSIS — S96811D Strain of other specified muscles and tendons at ankle and foot level, right foot, subsequent encounter: Secondary | ICD-10-CM | POA: Diagnosis not present

## 2018-12-29 NOTE — Telephone Encounter (Signed)
[place] states she is needing verbal orders for continue care 1 time a week for 3 weeks and clarification of ankle wound care, pt is in a boot and they are covering with a dry gauze and would like to know how often to change the dressing and due to surgery they would like OT orders do to his hand surgery.

## 2018-12-30 DIAGNOSIS — K219 Gastro-esophageal reflux disease without esophagitis: Secondary | ICD-10-CM | POA: Diagnosis not present

## 2018-12-30 DIAGNOSIS — G4700 Insomnia, unspecified: Secondary | ICD-10-CM | POA: Diagnosis not present

## 2018-12-30 DIAGNOSIS — Z9181 History of falling: Secondary | ICD-10-CM | POA: Diagnosis not present

## 2018-12-30 DIAGNOSIS — Z9682 Presence of neurostimulator: Secondary | ICD-10-CM | POA: Diagnosis not present

## 2018-12-30 DIAGNOSIS — J449 Chronic obstructive pulmonary disease, unspecified: Secondary | ICD-10-CM | POA: Diagnosis not present

## 2018-12-30 DIAGNOSIS — G4733 Obstructive sleep apnea (adult) (pediatric): Secondary | ICD-10-CM | POA: Diagnosis not present

## 2018-12-30 DIAGNOSIS — S96811D Strain of other specified muscles and tendons at ankle and foot level, right foot, subsequent encounter: Secondary | ICD-10-CM | POA: Diagnosis not present

## 2018-12-30 DIAGNOSIS — G250 Essential tremor: Secondary | ICD-10-CM | POA: Diagnosis not present

## 2018-12-30 NOTE — Telephone Encounter (Signed)
Ok for verbal 

## 2018-12-31 NOTE — Telephone Encounter (Signed)
I informed [facility] Dr. March Rummage had okayed verbal orders and qod dry dressing change to surgical site.

## 2019-01-01 DIAGNOSIS — G250 Essential tremor: Secondary | ICD-10-CM | POA: Diagnosis not present

## 2019-01-01 DIAGNOSIS — K219 Gastro-esophageal reflux disease without esophagitis: Secondary | ICD-10-CM | POA: Diagnosis not present

## 2019-01-01 DIAGNOSIS — J449 Chronic obstructive pulmonary disease, unspecified: Secondary | ICD-10-CM | POA: Diagnosis not present

## 2019-01-01 DIAGNOSIS — Z9181 History of falling: Secondary | ICD-10-CM | POA: Diagnosis not present

## 2019-01-01 DIAGNOSIS — G4731 Primary central sleep apnea: Secondary | ICD-10-CM | POA: Diagnosis not present

## 2019-01-01 DIAGNOSIS — G4733 Obstructive sleep apnea (adult) (pediatric): Secondary | ICD-10-CM | POA: Diagnosis not present

## 2019-01-01 DIAGNOSIS — S96811D Strain of other specified muscles and tendons at ankle and foot level, right foot, subsequent encounter: Secondary | ICD-10-CM | POA: Diagnosis not present

## 2019-01-01 DIAGNOSIS — Z9682 Presence of neurostimulator: Secondary | ICD-10-CM | POA: Diagnosis not present

## 2019-01-01 DIAGNOSIS — G4700 Insomnia, unspecified: Secondary | ICD-10-CM | POA: Diagnosis not present

## 2019-01-02 ENCOUNTER — Ambulatory Visit (INDEPENDENT_AMBULATORY_CARE_PROVIDER_SITE_OTHER): Payer: Medicare HMO | Admitting: Podiatry

## 2019-01-02 ENCOUNTER — Other Ambulatory Visit: Payer: Self-pay

## 2019-01-02 ENCOUNTER — Telehealth: Payer: Self-pay | Admitting: *Deleted

## 2019-01-02 DIAGNOSIS — Z9889 Other specified postprocedural states: Secondary | ICD-10-CM

## 2019-01-02 DIAGNOSIS — S86311D Strain of muscle(s) and tendon(s) of peroneal muscle group at lower leg level, right leg, subsequent encounter: Secondary | ICD-10-CM | POA: Diagnosis not present

## 2019-01-02 NOTE — Telephone Encounter (Signed)
[place] request VO for 2x week for 3 weeks.

## 2019-01-02 NOTE — Progress Notes (Signed)
  Subjective:  Patient ID: Allen [name], male    DOB: 10/03/1950,  MRN: [id number]  Chief Complaint  Patient presents with  . Routine Post Op    POV#1 DOS 12/24/2018 REPAIR PERONEAL TENDON RT. Pt states healing well, denies fever/chills/nausea/vomiting. Pt states he is having painful occasional cramping at the surgical site. No other concerns.    DOS: 12/24/2018 Procedure: Repair of peroneal tendon x2  68 y.o. male returns for post-op check. Doing well hx as above.  Review of Systems: Negative except as noted in the HPI. Denies N/V/F/Ch.  Past Medical History:  Diagnosis Date  . Asthma   . Bilateral cataracts   . ED (erectile dysfunction)   . Essential tremor    with nerve stimulator  . GERD (gastroesophageal reflux disease)   . Insomnia   . OSA treated with BiPAP   . Right carpal tunnel syndrome     Current Outpatient Medications:  .  azithromycin (ZITHROMAX) 250 MG tablet, Take by mouth as directed., Disp: , Rfl:  .  clindamycin (CLEOCIN) 150 MG capsule, Take 1 capsule (150 mg total) by mouth 2 (two) times daily., Disp: 14 capsule, Rfl: 0 .  fluticasone furoate-vilanterol (BREO ELLIPTA) 200-25 MCG/INH AEPB, Inhale 1 puff into the lungs daily., Disp: 90 each, Rfl: 3 .  mupirocin ointment (BACTROBAN) 2 %, , Disp: , Rfl:  .  ondansetron (ZOFRAN) 4 MG tablet, Take 1 tablet (4 mg total) by mouth every 8 (eight) hours as needed for nausea or vomiting., Disp: 20 tablet, Rfl: 0 .  oxyCODONE-acetaminophen (PERCOCET) 10-325 MG tablet, Take 1 tablet by mouth every 4 (four) hours as needed for pain., Disp: 20 tablet, Rfl: 0 .  tadalafil (CIALIS) 10 MG tablet, Take 1 tablet (10 mg total) by mouth daily as needed for erectile dysfunction., Disp: 90 tablet, Rfl: 1 .  traZODone (DESYREL) 100 MG tablet, Take 1 tablet (100 mg total) by mouth at bedtime., Disp: 90 tablet, Rfl: 1 .  Zolpidem Tartrate 1.75 MG SUBL, Place 1.75 mg under the tongue at bedtime., Disp: 90 tablet, Rfl: 1  Social  History   Tobacco Use  Smoking Status Former Smoker  . Packs/day: 3.00  . Years: 20.00  . Pack years: 60.00  . Types: Cigarettes  . Quit date: 01/15/1989  . Years since quitting: 29.9  Smokeless Tobacco Former User    No Known Allergies Objective:  There were no vitals filed for this visit. There is no height or weight on file to calculate BMI. Constitutional Well developed. Well nourished.  Vascular Foot warm and well perfused. Capillary refill normal to all digits.   Neurologic Normal speech. Oriented to person, place, and time. Epicritic sensation to light touch grossly present bilaterally.  Dermatologic Skin healing well without signs of infection. Skin edges well coapted without signs of infection.  Orthopedic: Tenderness to palpation noted about the surgical site.   Radiographs: None Assessment:   1. Tear of peroneal tendon, right, subsequent encounter   2. Post-operative state    Plan:  Patient was evaluated and treated and all questions answered.  S/p foot surgery right -Progressing as expected post-operatively. -XR: None -WB Status: NWB in wheelchair -Sutures: staples intact. -Medications: none refilled. -Short leg cast applied with 4 rolls of fiberglass cast material. -Foot redressed.  No follow-ups on file.

## 2019-01-05 DIAGNOSIS — K219 Gastro-esophageal reflux disease without esophagitis: Secondary | ICD-10-CM | POA: Diagnosis not present

## 2019-01-05 DIAGNOSIS — S96811D Strain of other specified muscles and tendons at ankle and foot level, right foot, subsequent encounter: Secondary | ICD-10-CM | POA: Diagnosis not present

## 2019-01-05 DIAGNOSIS — G4733 Obstructive sleep apnea (adult) (pediatric): Secondary | ICD-10-CM | POA: Diagnosis not present

## 2019-01-05 DIAGNOSIS — G4700 Insomnia, unspecified: Secondary | ICD-10-CM | POA: Diagnosis not present

## 2019-01-05 DIAGNOSIS — Z9682 Presence of neurostimulator: Secondary | ICD-10-CM | POA: Diagnosis not present

## 2019-01-05 DIAGNOSIS — J449 Chronic obstructive pulmonary disease, unspecified: Secondary | ICD-10-CM | POA: Diagnosis not present

## 2019-01-05 DIAGNOSIS — Z9181 History of falling: Secondary | ICD-10-CM | POA: Diagnosis not present

## 2019-01-05 DIAGNOSIS — G250 Essential tremor: Secondary | ICD-10-CM | POA: Diagnosis not present

## 2019-01-06 NOTE — Telephone Encounter (Signed)
Ok to give verbal 

## 2019-01-06 NOTE — Telephone Encounter (Signed)
Left message [place], OT of Dr. Eleanora Neighbor okay to begin OT recommended 01/02/2019.

## 2019-01-07 ENCOUNTER — Encounter: Payer: Medicare HMO | Admitting: Podiatry

## 2019-01-11 DIAGNOSIS — G4733 Obstructive sleep apnea (adult) (pediatric): Secondary | ICD-10-CM | POA: Diagnosis not present

## 2019-01-15 ENCOUNTER — Other Ambulatory Visit: Payer: Self-pay

## 2019-01-15 ENCOUNTER — Ambulatory Visit (INDEPENDENT_AMBULATORY_CARE_PROVIDER_SITE_OTHER): Payer: Medicare HMO | Admitting: Podiatry

## 2019-01-15 DIAGNOSIS — S86311D Strain of muscle(s) and tendon(s) of peroneal muscle group at lower leg level, right leg, subsequent encounter: Secondary | ICD-10-CM

## 2019-01-15 DIAGNOSIS — Z9889 Other specified postprocedural states: Secondary | ICD-10-CM

## 2019-01-15 MED ORDER — HYDROXYZINE HCL 10 MG PO TABS: 10.0000 mg | ORAL_TABLET | Freq: Three times a day (TID) | ORAL | 0 refills | Status: DC | PRN

## 2019-01-15 NOTE — Progress Notes (Signed)
Subjective:  Patient ID: Allen [name], male    DOB: Dec 12, 1950,  MRN: [id number]  Chief Complaint  Patient presents with  . Routine Post Op    POV#1 DOS 12/24/2018 REPAIR PERONEAL TENDON RT. Pt states that the right foot is rubbing up against the foot as well    DOS: 12/24/2018 Procedure: Repair of peroneal tendon x2  68 y.o. male returns for post-op check. Doing well hx as above.   Review of Systems: Negative except as noted in the HPI. Denies N/V/F/Ch.  Past Medical History:  Diagnosis Date  . Asthma   . Bilateral cataracts   . ED (erectile dysfunction)   . Essential tremor    with nerve stimulator  . GERD (gastroesophageal reflux disease)   . Insomnia   . OSA treated with BiPAP   . Right carpal tunnel syndrome     Current Outpatient Medications:  .  azithromycin (ZITHROMAX) 250 MG tablet, Take by mouth as directed., Disp: , Rfl:  .  clindamycin (CLEOCIN) 150 MG capsule, Take 1 capsule (150 mg total) by mouth 2 (two) times daily., Disp: 14 capsule, Rfl: 0 .  fluticasone furoate-vilanterol (BREO ELLIPTA) 200-25 MCG/INH AEPB, Inhale 1 puff into the lungs daily., Disp: 90 each, Rfl: 3 .  hydrOXYzine (ATARAX/VISTARIL) 10 MG tablet, Take 1 tablet (10 mg total) by mouth 3 (three) times daily as needed., Disp: 30 tablet, Rfl: 0 .  mupirocin ointment (BACTROBAN) 2 %, , Disp: , Rfl:  .  ondansetron (ZOFRAN) 4 MG tablet, Take 1 tablet (4 mg total) by mouth every 8 (eight) hours as needed for nausea or vomiting., Disp: 20 tablet, Rfl: 0 .  oxyCODONE-acetaminophen (PERCOCET) 10-325 MG tablet, Take 1 tablet by mouth every 4 (four) hours as needed for pain., Disp: 20 tablet, Rfl: 0 .  tadalafil (CIALIS) 10 MG tablet, Take 1 tablet (10 mg total) by mouth daily as needed for erectile dysfunction., Disp: 90 tablet, Rfl: 1 .  traZODone (DESYREL) 100 MG tablet, Take 1 tablet (100 mg total) by mouth at bedtime., Disp: 90 tablet, Rfl: 1 .  Zolpidem Tartrate 1.75 MG SUBL, Place 1.75 mg under the  tongue at bedtime., Disp: 90 tablet, Rfl: 1  Social History   Tobacco Use  Smoking Status Former Smoker  . Packs/day: 3.00  . Years: 20.00  . Pack years: 60.00  . Types: Cigarettes  . Quit date: 01/15/1989  . Years since quitting: 30.0  Smokeless Tobacco Former User    No Known Allergies Objective:  There were no vitals filed for this visit. There is no height or weight on file to calculate BMI. Constitutional Well developed. Well nourished.  Vascular Foot warm and well perfused. Capillary refill normal to all digits.   Neurologic Normal speech. Oriented to person, place, and time. Epicritic sensation to light touch grossly present bilaterally.  Dermatologic Skin healing well without signs of infection. Skin edges well coapted without signs of infection.  Orthopedic: Tenderness to palpation noted about the surgical site.   Radiographs: None Assessment:   1. Tear of peroneal tendon, right, subsequent encounter   2. Post-operative state    Plan:  Patient was evaluated and treated and all questions answered.  S/p foot surgery right -Progressing as expected post-operatively. -XR: None -WB Status: NWB in wheelchair -Sutures: staples intact. Sutures removed. -At this point we can transition back to the cam boot -We will place order for home health PT  Return in about 2 weeks (around 01/29/2019) for Post-Op (No XRs).

## 2019-01-16 DIAGNOSIS — M7671 Peroneal tendinitis, right leg: Secondary | ICD-10-CM | POA: Diagnosis not present

## 2019-01-16 DIAGNOSIS — S86311D Strain of muscle(s) and tendon(s) of peroneal muscle group at lower leg level, right leg, subsequent encounter: Secondary | ICD-10-CM | POA: Diagnosis not present

## 2019-01-16 DIAGNOSIS — M25571 Pain in right ankle and joints of right foot: Secondary | ICD-10-CM | POA: Diagnosis not present

## 2019-01-19 ENCOUNTER — Telehealth: Payer: Self-pay | Admitting: *Deleted

## 2019-01-19 ENCOUNTER — Telehealth: Payer: Self-pay | Admitting: Podiatry

## 2019-01-19 DIAGNOSIS — S86311D Strain of muscle(s) and tendon(s) of peroneal muscle group at lower leg level, right leg, subsequent encounter: Secondary | ICD-10-CM

## 2019-01-19 DIAGNOSIS — Z9889 Other specified postprocedural states: Secondary | ICD-10-CM

## 2019-01-19 DIAGNOSIS — G8929 Other chronic pain: Secondary | ICD-10-CM

## 2019-01-19 DIAGNOSIS — T148XXA Other injury of unspecified body region, initial encounter: Secondary | ICD-10-CM

## 2019-01-19 DIAGNOSIS — M25571 Pain in right ankle and joints of right foot: Secondary | ICD-10-CM

## 2019-01-19 NOTE — Telephone Encounter (Signed)
-----   Message from Evelina Bucy, DPM sent at 01/15/2019  2:40 PM EST ----- Resume HHC services including home PT.PT Protocol-Work on PROM x1 week. No excessive ankle inversion only to 15 degrees inv, THEN -Work on AROM x1 week

## 2019-01-19 NOTE — Telephone Encounter (Signed)
[facility] called to say that the way the request is written it says passive range of motion does not require physical therapy please call Duwayne Heck [phone] ext [phone]

## 2019-01-19 NOTE — Telephone Encounter (Signed)
Faxed copy of Dr. Eleanora Neighbor 01/15/2019 2:40pm orders to [facility].

## 2019-01-20 NOTE — Addendum Note (Signed)
Addended by: Harriett Sine D on: 01/20/2019 11:34 AM   Modules accepted: Orders

## 2019-01-20 NOTE — Telephone Encounter (Signed)
I spoke with [place] states they need documentation of necessity for PT Passive ROM and Active ROM. Faxed PT referral orders to [facility] - Attn:  Duwayne Heck.

## 2019-01-22 ENCOUNTER — Encounter: Payer: Medicare HMO | Admitting: Podiatry

## 2019-01-22 DIAGNOSIS — S86311D Strain of muscle(s) and tendon(s) of peroneal muscle group at lower leg level, right leg, subsequent encounter: Secondary | ICD-10-CM | POA: Diagnosis not present

## 2019-01-22 DIAGNOSIS — N529 Male erectile dysfunction, unspecified: Secondary | ICD-10-CM | POA: Diagnosis not present

## 2019-01-22 DIAGNOSIS — G5601 Carpal tunnel syndrome, right upper limb: Secondary | ICD-10-CM | POA: Diagnosis not present

## 2019-01-22 DIAGNOSIS — Z87891 Personal history of nicotine dependence: Secondary | ICD-10-CM | POA: Diagnosis not present

## 2019-01-22 DIAGNOSIS — Z9682 Presence of neurostimulator: Secondary | ICD-10-CM | POA: Diagnosis not present

## 2019-01-22 DIAGNOSIS — H269 Unspecified cataract: Secondary | ICD-10-CM | POA: Diagnosis not present

## 2019-01-22 DIAGNOSIS — J45909 Unspecified asthma, uncomplicated: Secondary | ICD-10-CM | POA: Diagnosis not present

## 2019-01-22 DIAGNOSIS — G4700 Insomnia, unspecified: Secondary | ICD-10-CM | POA: Diagnosis not present

## 2019-01-22 DIAGNOSIS — K219 Gastro-esophageal reflux disease without esophagitis: Secondary | ICD-10-CM | POA: Diagnosis not present

## 2019-01-22 DIAGNOSIS — G250 Essential tremor: Secondary | ICD-10-CM | POA: Diagnosis not present

## 2019-01-23 DIAGNOSIS — G250 Essential tremor: Secondary | ICD-10-CM | POA: Diagnosis not present

## 2019-01-23 DIAGNOSIS — H269 Unspecified cataract: Secondary | ICD-10-CM | POA: Diagnosis not present

## 2019-01-23 DIAGNOSIS — S86311A Strain of muscle(s) and tendon(s) of peroneal muscle group at lower leg level, right leg, initial encounter: Secondary | ICD-10-CM | POA: Diagnosis not present

## 2019-01-23 DIAGNOSIS — G4733 Obstructive sleep apnea (adult) (pediatric): Secondary | ICD-10-CM | POA: Diagnosis not present

## 2019-01-23 DIAGNOSIS — K219 Gastro-esophageal reflux disease without esophagitis: Secondary | ICD-10-CM | POA: Diagnosis not present

## 2019-01-23 DIAGNOSIS — S86311D Strain of muscle(s) and tendon(s) of peroneal muscle group at lower leg level, right leg, subsequent encounter: Secondary | ICD-10-CM | POA: Diagnosis not present

## 2019-01-23 DIAGNOSIS — T148XXA Other injury of unspecified body region, initial encounter: Secondary | ICD-10-CM | POA: Diagnosis not present

## 2019-01-23 DIAGNOSIS — Z87891 Personal history of nicotine dependence: Secondary | ICD-10-CM | POA: Diagnosis not present

## 2019-01-23 DIAGNOSIS — N529 Male erectile dysfunction, unspecified: Secondary | ICD-10-CM | POA: Diagnosis not present

## 2019-01-23 DIAGNOSIS — J45909 Unspecified asthma, uncomplicated: Secondary | ICD-10-CM | POA: Diagnosis not present

## 2019-01-23 DIAGNOSIS — Z9682 Presence of neurostimulator: Secondary | ICD-10-CM | POA: Diagnosis not present

## 2019-01-23 DIAGNOSIS — G5601 Carpal tunnel syndrome, right upper limb: Secondary | ICD-10-CM | POA: Diagnosis not present

## 2019-01-23 DIAGNOSIS — G4700 Insomnia, unspecified: Secondary | ICD-10-CM | POA: Diagnosis not present

## 2019-01-28 DIAGNOSIS — Z87891 Personal history of nicotine dependence: Secondary | ICD-10-CM | POA: Diagnosis not present

## 2019-01-28 DIAGNOSIS — G250 Essential tremor: Secondary | ICD-10-CM | POA: Diagnosis not present

## 2019-01-28 DIAGNOSIS — Z9682 Presence of neurostimulator: Secondary | ICD-10-CM | POA: Diagnosis not present

## 2019-01-28 DIAGNOSIS — G5601 Carpal tunnel syndrome, right upper limb: Secondary | ICD-10-CM | POA: Diagnosis not present

## 2019-01-28 DIAGNOSIS — G4700 Insomnia, unspecified: Secondary | ICD-10-CM | POA: Diagnosis not present

## 2019-01-28 DIAGNOSIS — K219 Gastro-esophageal reflux disease without esophagitis: Secondary | ICD-10-CM | POA: Diagnosis not present

## 2019-01-28 DIAGNOSIS — H269 Unspecified cataract: Secondary | ICD-10-CM | POA: Diagnosis not present

## 2019-01-28 DIAGNOSIS — N529 Male erectile dysfunction, unspecified: Secondary | ICD-10-CM | POA: Diagnosis not present

## 2019-01-28 DIAGNOSIS — S86311D Strain of muscle(s) and tendon(s) of peroneal muscle group at lower leg level, right leg, subsequent encounter: Secondary | ICD-10-CM | POA: Diagnosis not present

## 2019-01-28 DIAGNOSIS — J45909 Unspecified asthma, uncomplicated: Secondary | ICD-10-CM | POA: Diagnosis not present

## 2019-01-29 DIAGNOSIS — G4700 Insomnia, unspecified: Secondary | ICD-10-CM | POA: Diagnosis not present

## 2019-01-29 DIAGNOSIS — G250 Essential tremor: Secondary | ICD-10-CM | POA: Diagnosis not present

## 2019-01-29 DIAGNOSIS — Z87891 Personal history of nicotine dependence: Secondary | ICD-10-CM | POA: Diagnosis not present

## 2019-01-29 DIAGNOSIS — J45909 Unspecified asthma, uncomplicated: Secondary | ICD-10-CM | POA: Diagnosis not present

## 2019-01-29 DIAGNOSIS — N529 Male erectile dysfunction, unspecified: Secondary | ICD-10-CM | POA: Diagnosis not present

## 2019-01-29 DIAGNOSIS — S86311D Strain of muscle(s) and tendon(s) of peroneal muscle group at lower leg level, right leg, subsequent encounter: Secondary | ICD-10-CM | POA: Diagnosis not present

## 2019-01-29 DIAGNOSIS — Z9682 Presence of neurostimulator: Secondary | ICD-10-CM | POA: Diagnosis not present

## 2019-01-29 DIAGNOSIS — K219 Gastro-esophageal reflux disease without esophagitis: Secondary | ICD-10-CM | POA: Diagnosis not present

## 2019-01-29 DIAGNOSIS — H269 Unspecified cataract: Secondary | ICD-10-CM | POA: Diagnosis not present

## 2019-01-29 DIAGNOSIS — G5601 Carpal tunnel syndrome, right upper limb: Secondary | ICD-10-CM | POA: Diagnosis not present

## 2019-01-30 ENCOUNTER — Ambulatory Visit (INDEPENDENT_AMBULATORY_CARE_PROVIDER_SITE_OTHER): Payer: Medicare HMO | Admitting: Podiatry

## 2019-01-30 ENCOUNTER — Telehealth: Payer: Self-pay | Admitting: *Deleted

## 2019-01-30 ENCOUNTER — Other Ambulatory Visit: Payer: Self-pay

## 2019-01-30 DIAGNOSIS — N529 Male erectile dysfunction, unspecified: Secondary | ICD-10-CM | POA: Diagnosis not present

## 2019-01-30 DIAGNOSIS — G4700 Insomnia, unspecified: Secondary | ICD-10-CM | POA: Diagnosis not present

## 2019-01-30 DIAGNOSIS — Z87891 Personal history of nicotine dependence: Secondary | ICD-10-CM | POA: Diagnosis not present

## 2019-01-30 DIAGNOSIS — Z9889 Other specified postprocedural states: Secondary | ICD-10-CM

## 2019-01-30 DIAGNOSIS — S86311D Strain of muscle(s) and tendon(s) of peroneal muscle group at lower leg level, right leg, subsequent encounter: Secondary | ICD-10-CM

## 2019-01-30 DIAGNOSIS — G250 Essential tremor: Secondary | ICD-10-CM | POA: Diagnosis not present

## 2019-01-30 DIAGNOSIS — G5601 Carpal tunnel syndrome, right upper limb: Secondary | ICD-10-CM | POA: Diagnosis not present

## 2019-01-30 DIAGNOSIS — Z9682 Presence of neurostimulator: Secondary | ICD-10-CM | POA: Diagnosis not present

## 2019-01-30 DIAGNOSIS — J45909 Unspecified asthma, uncomplicated: Secondary | ICD-10-CM | POA: Diagnosis not present

## 2019-01-30 DIAGNOSIS — K219 Gastro-esophageal reflux disease without esophagitis: Secondary | ICD-10-CM | POA: Diagnosis not present

## 2019-01-30 DIAGNOSIS — H269 Unspecified cataract: Secondary | ICD-10-CM | POA: Diagnosis not present

## 2019-01-30 NOTE — Progress Notes (Signed)
  Subjective:  Patient ID: Allen [name], male    DOB: [birth year],  MRN: [id number]  Chief Complaint  Patient presents with  . Routine Post Op    POV#3 DOS 12.09.2020 REPAIR PERONEAL TENDON RT. Pt states he has no pain and is healing well. Denies fever/chills/nausea/vomiting. Pt states no concerns.    DOS: 12/24/2018 Procedure: Right Peroneal tendon repair  69 y.o. male presents with the above complaint. History confirmed with patient. Doing very well, working with PT.  Objective:  Physical Exam: no tenderness at the surgical site, no edema noted and calf supple, nontender. Incision: healing well, no significant drainage, no dehiscence  Assessment:   1. Tear of peroneal tendon, right, subsequent encounter   2. Post-operative state     Plan:  Patient was evaluated and treated and all questions answered.  Post-operative State -Staples removed -Steri-strips applied to the incision -Ok to start showering at this time. Advised they cannot soak. -WBAT in CAM boot -Work with PT. New orders placed. -Transition to ankle brace next visit.  Return in about 2 weeks (around 02/13/2019) for Post-Op (No XRs).

## 2019-01-30 NOTE — Telephone Encounter (Signed)
FAXED [facility] - JENNIFER ANDERSON.

## 2019-01-30 NOTE — Telephone Encounter (Signed)
-----   Message from Evelina Bucy, DPM sent at 01/30/2019  9:05 AM EST ----- Can you send updated PT orders for [facility] to start WB in the CAM boot with walker assist.

## 2019-02-01 DIAGNOSIS — G4731 Primary central sleep apnea: Secondary | ICD-10-CM | POA: Diagnosis not present

## 2019-02-01 DIAGNOSIS — G4733 Obstructive sleep apnea (adult) (pediatric): Secondary | ICD-10-CM | POA: Diagnosis not present

## 2019-02-02 DIAGNOSIS — G5601 Carpal tunnel syndrome, right upper limb: Secondary | ICD-10-CM | POA: Diagnosis not present

## 2019-02-02 DIAGNOSIS — K219 Gastro-esophageal reflux disease without esophagitis: Secondary | ICD-10-CM | POA: Diagnosis not present

## 2019-02-02 DIAGNOSIS — Z87891 Personal history of nicotine dependence: Secondary | ICD-10-CM | POA: Diagnosis not present

## 2019-02-02 DIAGNOSIS — G4700 Insomnia, unspecified: Secondary | ICD-10-CM | POA: Diagnosis not present

## 2019-02-02 DIAGNOSIS — J45909 Unspecified asthma, uncomplicated: Secondary | ICD-10-CM | POA: Diagnosis not present

## 2019-02-02 DIAGNOSIS — S86311D Strain of muscle(s) and tendon(s) of peroneal muscle group at lower leg level, right leg, subsequent encounter: Secondary | ICD-10-CM | POA: Diagnosis not present

## 2019-02-02 DIAGNOSIS — H269 Unspecified cataract: Secondary | ICD-10-CM | POA: Diagnosis not present

## 2019-02-02 DIAGNOSIS — N529 Male erectile dysfunction, unspecified: Secondary | ICD-10-CM | POA: Diagnosis not present

## 2019-02-02 DIAGNOSIS — Z9682 Presence of neurostimulator: Secondary | ICD-10-CM | POA: Diagnosis not present

## 2019-02-02 DIAGNOSIS — G250 Essential tremor: Secondary | ICD-10-CM | POA: Diagnosis not present

## 2019-02-04 DIAGNOSIS — N529 Male erectile dysfunction, unspecified: Secondary | ICD-10-CM | POA: Diagnosis not present

## 2019-02-04 DIAGNOSIS — K219 Gastro-esophageal reflux disease without esophagitis: Secondary | ICD-10-CM | POA: Diagnosis not present

## 2019-02-04 DIAGNOSIS — Z87891 Personal history of nicotine dependence: Secondary | ICD-10-CM | POA: Diagnosis not present

## 2019-02-04 DIAGNOSIS — Z9682 Presence of neurostimulator: Secondary | ICD-10-CM | POA: Diagnosis not present

## 2019-02-04 DIAGNOSIS — S86311D Strain of muscle(s) and tendon(s) of peroneal muscle group at lower leg level, right leg, subsequent encounter: Secondary | ICD-10-CM | POA: Diagnosis not present

## 2019-02-04 DIAGNOSIS — J45909 Unspecified asthma, uncomplicated: Secondary | ICD-10-CM | POA: Diagnosis not present

## 2019-02-04 DIAGNOSIS — G5601 Carpal tunnel syndrome, right upper limb: Secondary | ICD-10-CM | POA: Diagnosis not present

## 2019-02-04 DIAGNOSIS — G250 Essential tremor: Secondary | ICD-10-CM | POA: Diagnosis not present

## 2019-02-04 DIAGNOSIS — H269 Unspecified cataract: Secondary | ICD-10-CM | POA: Diagnosis not present

## 2019-02-04 DIAGNOSIS — G4700 Insomnia, unspecified: Secondary | ICD-10-CM | POA: Diagnosis not present

## 2019-02-06 DIAGNOSIS — G5601 Carpal tunnel syndrome, right upper limb: Secondary | ICD-10-CM | POA: Diagnosis not present

## 2019-02-06 DIAGNOSIS — J45909 Unspecified asthma, uncomplicated: Secondary | ICD-10-CM | POA: Diagnosis not present

## 2019-02-06 DIAGNOSIS — G250 Essential tremor: Secondary | ICD-10-CM | POA: Diagnosis not present

## 2019-02-06 DIAGNOSIS — K219 Gastro-esophageal reflux disease without esophagitis: Secondary | ICD-10-CM | POA: Diagnosis not present

## 2019-02-06 DIAGNOSIS — Z9682 Presence of neurostimulator: Secondary | ICD-10-CM | POA: Diagnosis not present

## 2019-02-06 DIAGNOSIS — G4700 Insomnia, unspecified: Secondary | ICD-10-CM | POA: Diagnosis not present

## 2019-02-06 DIAGNOSIS — S86311D Strain of muscle(s) and tendon(s) of peroneal muscle group at lower leg level, right leg, subsequent encounter: Secondary | ICD-10-CM | POA: Diagnosis not present

## 2019-02-06 DIAGNOSIS — Z87891 Personal history of nicotine dependence: Secondary | ICD-10-CM | POA: Diagnosis not present

## 2019-02-06 DIAGNOSIS — H269 Unspecified cataract: Secondary | ICD-10-CM | POA: Diagnosis not present

## 2019-02-06 DIAGNOSIS — N529 Male erectile dysfunction, unspecified: Secondary | ICD-10-CM | POA: Diagnosis not present

## 2019-02-10 DIAGNOSIS — G4733 Obstructive sleep apnea (adult) (pediatric): Secondary | ICD-10-CM | POA: Diagnosis not present

## 2019-02-12 ENCOUNTER — Encounter: Payer: Self-pay | Admitting: Internal Medicine

## 2019-02-12 ENCOUNTER — Ambulatory Visit: Payer: Medicare HMO

## 2019-02-13 ENCOUNTER — Other Ambulatory Visit: Payer: Self-pay

## 2019-02-13 ENCOUNTER — Ambulatory Visit (INDEPENDENT_AMBULATORY_CARE_PROVIDER_SITE_OTHER): Payer: Medicare HMO | Admitting: Podiatry

## 2019-02-13 DIAGNOSIS — S86311D Strain of muscle(s) and tendon(s) of peroneal muscle group at lower leg level, right leg, subsequent encounter: Secondary | ICD-10-CM

## 2019-02-13 DIAGNOSIS — Z9889 Other specified postprocedural states: Secondary | ICD-10-CM

## 2019-02-13 NOTE — Progress Notes (Signed)
  Subjective:  Patient ID: Allen [name], male    DOB: [birth year],  MRN: [id number]  Chief Complaint  Patient presents with  . Routine Post Op    DOS 12.09.2020 REPAIR PERONEAL TENDON RT. Pt states healing well and has no pain or concerns.   DOS: 12/24/2018 Procedure: Right Peroneal tendon repair  69 y.o. male presents with the above complaint. History confirmed with patient. States that he is walking well with the boot. Has completed PT. No pain at all.  Objective:  Physical Exam: no tenderness at the surgical site, no edema noted and calf supple, nontender. Good peroneal strength Incision: healed.   Assessment:   1. Tear of peroneal tendon, right, subsequent encounter   2. Post-operative state     Plan:  Patient was evaluated and treated and all questions answered.  Post-operative State -Well healed. -WBAT in [place] brace and sneaker for 2 weeks. Transition out in 2 weeks. Discussed after several days he can weightbear in the sneaker only without the brace while at home only. -Can return to activity such as extended walking and treadmill only with brace at this time.   Return in about 1 month (around 03/15/2019) for Post-Op (No XRs).

## 2019-02-17 DIAGNOSIS — M65321 Trigger finger, right index finger: Secondary | ICD-10-CM | POA: Diagnosis not present

## 2019-02-17 DIAGNOSIS — M65331 Trigger finger, right middle finger: Secondary | ICD-10-CM | POA: Diagnosis not present

## 2019-02-17 DIAGNOSIS — G5601 Carpal tunnel syndrome, right upper limb: Secondary | ICD-10-CM | POA: Diagnosis not present

## 2019-02-17 DIAGNOSIS — M1811 Unilateral primary osteoarthritis of first carpometacarpal joint, right hand: Secondary | ICD-10-CM | POA: Diagnosis not present

## 2019-02-17 DIAGNOSIS — M65341 Trigger finger, right ring finger: Secondary | ICD-10-CM | POA: Diagnosis not present

## 2019-02-17 DIAGNOSIS — M25532 Pain in left wrist: Secondary | ICD-10-CM | POA: Diagnosis not present

## 2019-02-20 ENCOUNTER — Ambulatory Visit: Payer: Medicare HMO | Attending: Internal Medicine

## 2019-02-20 DIAGNOSIS — Z23 Encounter for immunization: Secondary | ICD-10-CM | POA: Insufficient documentation

## 2019-02-20 DIAGNOSIS — R69 Illness, unspecified: Secondary | ICD-10-CM | POA: Diagnosis not present

## 2019-02-20 NOTE — Progress Notes (Signed)
   Covid-19 Vaccination Clinic  Name:  Allen [name]    MRN: [id number] DOB: 20-Dec-1950  02/20/2019  [name] was observed post Covid-19 immunization for 15 minutes without incidence. He was provided with Vaccine Information Sheet and instruction to access the V-Safe system.   [name] was instructed to call 911 with any severe reactions post vaccine: [name] Kitchen Difficulty breathing  . Swelling of your face and throat  . A fast heartbeat  . A bad rash all over your body  . Dizziness and weakness    Immunizations Administered    Name Date Dose VIS Date Route   Pfizer COVID-19 Vaccine 02/20/2019  5:24 PM 0.3 mL 12/26/2018 Intramuscular   Manufacturer: [place]   Lot: [id number]   [place]: [id number]

## 2019-02-24 ENCOUNTER — Encounter: Payer: Self-pay | Admitting: Internal Medicine

## 2019-02-24 DIAGNOSIS — R972 Elevated prostate specific antigen [PSA]: Secondary | ICD-10-CM

## 2019-02-25 ENCOUNTER — Other Ambulatory Visit: Payer: Self-pay

## 2019-02-25 DIAGNOSIS — G4733 Obstructive sleep apnea (adult) (pediatric): Secondary | ICD-10-CM | POA: Diagnosis not present

## 2019-02-26 ENCOUNTER — Telehealth: Payer: Self-pay | Admitting: Internal Medicine

## 2019-02-26 ENCOUNTER — Other Ambulatory Visit (INDEPENDENT_AMBULATORY_CARE_PROVIDER_SITE_OTHER): Payer: Medicare HMO

## 2019-02-26 DIAGNOSIS — H04123 Dry eye syndrome of bilateral lacrimal glands: Secondary | ICD-10-CM | POA: Diagnosis not present

## 2019-02-26 DIAGNOSIS — R972 Elevated prostate specific antigen [PSA]: Secondary | ICD-10-CM | POA: Diagnosis not present

## 2019-02-26 DIAGNOSIS — H26491 Other secondary cataract, right eye: Secondary | ICD-10-CM | POA: Diagnosis not present

## 2019-02-26 LAB — PSA: PSA: 3.99 ng/mL (ref 0.10–4.00)

## 2019-02-26 NOTE — Telephone Encounter (Signed)
Pt needs his Blood results faxed to the Urology that he was referred to by [facility]. Pt would also like a copy faxed to him at [phone]  If needed pt can be reached at [phone]

## 2019-02-26 NOTE — Telephone Encounter (Signed)
Pt would like to have his lab results go to his Urologist and stated that the doctor and CMA has the information.

## 2019-02-27 NOTE — Telephone Encounter (Signed)
Result faxed and confirmed

## 2019-02-27 NOTE — Telephone Encounter (Signed)
Result faxed and confirmed.

## 2019-03-02 DIAGNOSIS — N5201 Erectile dysfunction due to arterial insufficiency: Secondary | ICD-10-CM | POA: Diagnosis not present

## 2019-03-02 DIAGNOSIS — R972 Elevated prostate specific antigen [PSA]: Secondary | ICD-10-CM | POA: Diagnosis not present

## 2019-03-04 DIAGNOSIS — R69 Illness, unspecified: Secondary | ICD-10-CM | POA: Diagnosis not present

## 2019-03-04 DIAGNOSIS — G4733 Obstructive sleep apnea (adult) (pediatric): Secondary | ICD-10-CM | POA: Diagnosis not present

## 2019-03-04 DIAGNOSIS — G4731 Primary central sleep apnea: Secondary | ICD-10-CM | POA: Diagnosis not present

## 2019-03-05 ENCOUNTER — Ambulatory Visit: Payer: Medicare HMO

## 2019-03-06 ENCOUNTER — Ambulatory Visit: Payer: Medicare HMO

## 2019-03-13 ENCOUNTER — Ambulatory Visit (INDEPENDENT_AMBULATORY_CARE_PROVIDER_SITE_OTHER): Payer: Medicare HMO | Admitting: Podiatry

## 2019-03-13 ENCOUNTER — Other Ambulatory Visit: Payer: Self-pay

## 2019-03-13 DIAGNOSIS — G8929 Other chronic pain: Secondary | ICD-10-CM

## 2019-03-13 DIAGNOSIS — T148XXA Other injury of unspecified body region, initial encounter: Secondary | ICD-10-CM

## 2019-03-13 DIAGNOSIS — S86311D Strain of muscle(s) and tendon(s) of peroneal muscle group at lower leg level, right leg, subsequent encounter: Secondary | ICD-10-CM

## 2019-03-13 DIAGNOSIS — G4733 Obstructive sleep apnea (adult) (pediatric): Secondary | ICD-10-CM | POA: Diagnosis not present

## 2019-03-13 DIAGNOSIS — M25571 Pain in right ankle and joints of right foot: Secondary | ICD-10-CM

## 2019-03-13 DIAGNOSIS — Z0100 Encounter for examination of eyes and vision without abnormal findings: Secondary | ICD-10-CM | POA: Diagnosis not present

## 2019-03-13 NOTE — Progress Notes (Signed)
  Subjective:  Patient ID: Allen [name], male    DOB: 1950/07/26,  MRN: [id number]  Chief Complaint  Patient presents with  . Routine Post Op    DOS 12.09.2020 Repair peroneal tendon rt. "I feel 100% and doing great" Pt states no concerns.   DOS: 12/24/2018 Procedure: Right Peroneal tendon repair  69 y.o. male presents with the above complaint. History confirmed with patient.  Objective:  Physical Exam: no tenderness at the surgical site, no edema noted and calf supple, nontender. Good peroneal strength Incision: healed.   Assessment:   1. Tear of peroneal tendon, right, subsequent encounter   2. Torn tendon   3. Chronic pain of right ankle    Plan:  Patient was evaluated and treated and all questions answered.  Post-operative State -Well healed no pain. -At this point we can d/c the ankle brace. Continue normal shoegear -F/u as needed should any issues persist.  Return if symptoms worsen or fail to improve.

## 2019-03-17 ENCOUNTER — Ambulatory Visit: Payer: Medicare HMO

## 2019-03-17 ENCOUNTER — Ambulatory Visit: Payer: Medicare HMO | Attending: Internal Medicine

## 2019-03-17 DIAGNOSIS — Z23 Encounter for immunization: Secondary | ICD-10-CM

## 2019-03-17 NOTE — Progress Notes (Signed)
   Covid-19 Vaccination Clinic  Name:  Allen [name]    MRN: [id number] DOB: April 29, 1950  03/17/2019  [name] was observed post Covid-19 immunization for 15 minutes without incident. He was provided with Vaccine Information Sheet and instruction to access the V-Safe system.   [name] was instructed to call 911 with any severe reactions post vaccine: [name] Kitchen Difficulty breathing  . Swelling of face and throat  . A fast heartbeat  . A bad rash all over body  . Dizziness and weakness   Immunizations Administered    Name Date Dose VIS Date Route   Pfizer COVID-19 Vaccine 03/17/2019 10:15 AM 0.3 mL 12/26/2018 Intramuscular   Manufacturer: [place]   Lot: [id number]   [place]: [id number]

## 2019-03-18 ENCOUNTER — Ambulatory Visit: Payer: Medicare HMO | Admitting: Internal Medicine

## 2019-03-25 DIAGNOSIS — G4733 Obstructive sleep apnea (adult) (pediatric): Secondary | ICD-10-CM | POA: Diagnosis not present

## 2019-03-27 ENCOUNTER — Ambulatory Visit: Payer: Medicare HMO | Admitting: Pulmonary Disease

## 2019-03-27 ENCOUNTER — Other Ambulatory Visit: Payer: Self-pay

## 2019-03-27 ENCOUNTER — Encounter: Payer: Self-pay | Admitting: Pulmonary Disease

## 2019-03-27 VITALS — BP 140/84 | HR 84 | Temp 98.1°F | Ht 68.0 in | Wt 243.8 lb

## 2019-03-27 DIAGNOSIS — G4733 Obstructive sleep apnea (adult) (pediatric): Secondary | ICD-10-CM

## 2019-03-27 DIAGNOSIS — Z789 Other specified health status: Secondary | ICD-10-CM | POA: Diagnosis not present

## 2019-03-27 DIAGNOSIS — J4540 Moderate persistent asthma, uncomplicated: Secondary | ICD-10-CM | POA: Diagnosis not present

## 2019-03-27 MED ORDER — MONTELUKAST SODIUM 10 MG PO TABS: 10.0000 mg | ORAL_TABLET | Freq: Every day | ORAL | 3 refills | Status: DC

## 2019-03-27 NOTE — Progress Notes (Signed)
[name]  Chief Complaint  Patient presents with  . Follow-up    OSA treated with BIPAP, trouble breathing feels like it he is trying to catch his breath.     Constitutional:  BP 140/84 (BP Location: Right Arm, Patient Position: Sitting, Cuff Size: Normal)   Pulse 84   Temp 98.1 F (36.7 C)   Ht 5\' 8"  (1.727 m)   Wt 243 lb 12.8 oz (110.6 kg)   SpO2 96% Comment: on room air  BMI 37.07 kg/m   Past Medical History:  Carpal tunnel, GERD, Tremor, ED, Cataracts  Brief Summary:  Allen [name] is a 69 y.o. male former smoker with asthma and obstructive sleep apnea.  He got his second [place] shot about 2 weeks ago.  He has noticed feeling more short of breath.  Mostly in the evening and at night.  Feels discomfort in chest also.  Hasn't been on singulair since last visit.  Albuterol might help some.  Not having fever, sputum, wheeze, leg swelling.  Did have problems with reflux previously, but hasn't been on PPI for a while.  Has trouble with mask leak.  Makes it difficult to use Bipap.  AHI in normal range when he can use Bipap.  Physical Exam:   Appearance - well kempt   ENMT - no sinus tenderness, no nasal discharge, no oral exudate  Neck - no masses, trachea midline, no thyromegaly, no elevation in JVP  Respiratory - normal appearance of chest wall, normal respiratory effort w/o accessory muscle use, no dullness on percussion, no wheezing or rales  CV - s1s2 regular rate and rhythm, no murmurs, no peripheral edema, varicose veins  GI - soft, non tender  Lymph - no adenopathy noted in neck and axillary areas  Ext - no cyanosis, clubbing, or joint inflammation noted  Skin - no rashes, lesions, or ulcers  Psych - normal mood and affect   Assessment/Plan:   Allergic asthma. - his current symptoms might be related to allergies triggering his asthma - don't think his current symptoms are side effect from [place] vaccine - restart singulair - continue breo and prn albuterol - if symptoms no better after a week, then needs further assessment  Obstructive sleep apnea. - has lots of trouble with mask leak - will send him to sleep lab to get mask refit - will change auto Bipap to minimal EPAP 5, maximal IPAP 12, and PS 4 cm H2O  Insomnia with sleep apnea. - continue trazodone with prn sublingual ambien   Tremor. - followed by Dr. Carles Collet with neurology   Patient Instructions  Montelukast (singulair) 10 mg pill nightly  Will change your auto Bipap setting to lower pressure range  Will arrange for referral to sleep lab to work on mask refitting  Send a MyChart message if you aren't improving after a week on singulair  Follow up in 6 months   Time spent 32 minutes  Chesley Mires, MD [place] Pager: [phone] 03/27/2019, 10:35 AM  Flow Sheet     Pulmonary tests:  PFT 12/21/16 >> FEV1 2.40 (69%), FEV1% 74, TLC 4.95 (73%), DLCO 77%, +BD  Sleep tests:  HST 04/20/15 >> AHI 40 Auto Bipap 08/08/18 to 11/05/18 >> used on 59 of 90 nights with average 5 hrs 17 min.  Average AHI 3 with median Bipap 14/10 and 95 th Bipap 17/13 cm H2O  Medications:   Allergies as of 03/27/2019   No Known  Allergies     Medication List       Accurate as of March 27, 2019 10:35 AM. If you have any questions, ask your nurse or doctor.        STOP taking these medications   azithromycin 250 MG tablet Commonly known as: ZITHROMAX Stopped by: Chesley Mires, MD   clindamycin 150 MG capsule Commonly known as: Cleocin Stopped by: Chesley Mires, MD   levofloxacin 750 MG tablet Commonly known as: LEVAQUIN Stopped by: Chesley Mires, MD     TAKE these medications   Breo Ellipta 200-25 MCG/INH Aepb Generic drug: fluticasone furoate-vilanterol Inhale 1 puff into the lungs daily.   hydrOXYzine 10 MG tablet Commonly known as: ATARAX/VISTARIL Take 1 tablet (10 mg total) by mouth 3  (three) times daily as needed.   montelukast 10 MG tablet Commonly known as: SINGULAIR Take 1 tablet (10 mg total) by mouth at bedtime. Started by: Chesley Mires, MD   mupirocin ointment 2 % Commonly known as: BACTROBAN   ondansetron 4 MG tablet Commonly known as: Zofran Take 1 tablet (4 mg total) by mouth every 8 (eight) hours as needed for nausea or vomiting.   oxyCODONE-acetaminophen 10-325 MG tablet Commonly known as: PERCOCET Take 1 tablet by mouth every 4 (four) hours as needed for pain.   tadalafil 10 MG tablet Commonly known as: Cialis Take 1 tablet (10 mg total) by mouth daily as needed for erectile dysfunction.   traZODone 100 MG tablet Commonly known as: DESYREL Take 1 tablet (100 mg total) by mouth at bedtime.   Zolpidem Tartrate 1.75 MG Subl Place 1.75 mg under the tongue at bedtime.       Past Surgical History:  He  has a past surgical history that includes Hernia repair (Right, 2007); Cataract extraction (Bilateral, 2020); Vein Surgery (Bilateral, 2009); Rotator cuff repair (Right, 2014); Appendectomy (2015); Total shoulder replacement (Left, 2016); Deep brain stimulator placement (Left, 2016); and Carpal tunnel release (Left, 2019).  Family History:  His family history includes Asthma in his father; COPD in his mother; Healthy in his child; Rectal cancer in his sister.  Social History:  He  reports that he quit smoking about 30 years ago. His smoking use included cigarettes. He has a 60.00 pack-year smoking history. He has quit using smokeless tobacco. He reports current alcohol use of about 14.0 standard drinks of alcohol per week.

## 2019-03-27 NOTE — Patient Instructions (Signed)
Montelukast (singulair) 10 mg pill nightly  Will change your auto Bipap setting to lower pressure range  Will arrange for referral to sleep lab to work on mask refitting  Send a MyChart message if you aren't improving after a week on singulair  Follow up in 6 months

## 2019-03-30 ENCOUNTER — Encounter (HOSPITAL_BASED_OUTPATIENT_CLINIC_OR_DEPARTMENT_OTHER): Payer: Self-pay

## 2019-03-30 DIAGNOSIS — R69 Illness, unspecified: Secondary | ICD-10-CM | POA: Diagnosis not present

## 2019-03-30 MED ORDER — ALBUTEROL SULFATE (2.5 MG/3ML) 0.083% IN NEBU: 2.5000 mg | INHALATION_SOLUTION | Freq: Four times a day (QID) | RESPIRATORY_TRACT | 3 refills | Status: DC

## 2019-04-01 DIAGNOSIS — G4731 Primary central sleep apnea: Secondary | ICD-10-CM | POA: Diagnosis not present

## 2019-04-01 DIAGNOSIS — G4733 Obstructive sleep apnea (adult) (pediatric): Secondary | ICD-10-CM | POA: Diagnosis not present

## 2019-04-02 DIAGNOSIS — R972 Elevated prostate specific antigen [PSA]: Secondary | ICD-10-CM | POA: Diagnosis not present

## 2019-04-02 DIAGNOSIS — N4232 Atypical small acinar proliferation of prostate: Secondary | ICD-10-CM | POA: Diagnosis not present

## 2019-04-04 ENCOUNTER — Other Ambulatory Visit (HOSPITAL_COMMUNITY)
Admission: RE | Admit: 2019-04-04 | Discharge: 2019-04-04 | Disposition: A | Discharge status: Home / Self Care | Payer: Medicare HMO | Source: Ambulatory Visit | Attending: Pulmonary Disease | Admitting: Pulmonary Disease

## 2019-04-04 DIAGNOSIS — Z20822 Contact with and (suspected) exposure to covid-19: Secondary | ICD-10-CM | POA: Diagnosis not present

## 2019-04-04 DIAGNOSIS — Z01812 Encounter for preprocedural laboratory examination: Secondary | ICD-10-CM | POA: Diagnosis not present

## 2019-04-04 LAB — SARS CORONAVIRUS 2 (TAT 6-24 HRS): SARS Coronavirus 2: NEGATIVE

## 2019-04-07 ENCOUNTER — Other Ambulatory Visit: Payer: Self-pay

## 2019-04-07 ENCOUNTER — Ambulatory Visit (HOSPITAL_BASED_OUTPATIENT_CLINIC_OR_DEPARTMENT_OTHER): Payer: Medicare HMO | Attending: Pulmonary Disease | Admitting: Pulmonary Disease

## 2019-04-07 DIAGNOSIS — Z789 Other specified health status: Secondary | ICD-10-CM

## 2019-04-09 DIAGNOSIS — N5201 Erectile dysfunction due to arterial insufficiency: Secondary | ICD-10-CM | POA: Diagnosis not present

## 2019-04-09 DIAGNOSIS — R972 Elevated prostate specific antigen [PSA]: Secondary | ICD-10-CM | POA: Diagnosis not present

## 2019-04-09 DIAGNOSIS — N401 Enlarged prostate with lower urinary tract symptoms: Secondary | ICD-10-CM | POA: Diagnosis not present

## 2019-04-09 DIAGNOSIS — R3912 Poor urinary stream: Secondary | ICD-10-CM | POA: Diagnosis not present

## 2019-04-09 NOTE — Progress Notes (Signed)
@Patient  ID: Allen [name], male    DOB: 03-01-1950, 69 y.o.   MRN: [id number]  Chief Complaint  Patient presents with  . Follow-up    2 wk for chest tightness/pressure. Was prescribed Singulair on 3/12, has not noticed a difference. Denied any SOB.     Referring provider: Isaac Bliss, [place]*  HPI:  69 year old male former smoker followed in our office for asthma and obstructive sleep apnea  PMH: GERD, tremor, insomnia Smoker/ Smoking History: Former smoker.  Quit 1991.  60-pack-year smoking history. Maintenance: Breo Ellipta 200 Pt of: Dr. Halford Chessman  04/10/2019  - Visit   69 year old male former smoker followed in our office for asthma and obstructive sleep apnea.  This is a 2-week follow-up.  He continues to have chest tightness and pressure.  Was prescribed Singulair on 3/12 with Dr. Halford Chessman.  He has not noticed a difference.  Patient and spouse recently relocated from [place].  This is the first spring season the patient has had in [place].  He reports that he had the symptoms for about 4 weeks.  It comes and goes but has definitely worsened over the last week.  Patient also reporting bandlike chest pressure.  We have not obtained recent chest x-ray imaging.  Last pulmonary function testing on file from when patient lived in [place] did show restriction.  Patient does report increased nasal congestion in the morning.  He is not currently taking a daily antihistamine.  He does not use nasal saline rinses.  Patient tolerated walk today in office fine without any oxygen desaturations.  Patient does have a BiPAP at home.  BiPAP compliance report shows improved compliance over the past couple of days.  We will have to further evaluate this at next office visit.  Questionaires / Pulmonary Flowsheets:   MMRC: mMRC Dyspnea Scale mMRC Score  04/10/2019 0    Epworth:  Results of the Epworth flowsheet 10/16/2018  Sitting and reading 1  Watching TV 1  Sitting, inactive in a  public place (e.g. a theatre or a meeting) 0  As a passenger in a car for an hour without a break 0  Lying down to rest in the afternoon when circumstances permit 1  Sitting and talking to someone 0  Sitting quietly after a lunch without alcohol 0  In a car, while stopped for a few minutes in traffic 0  Total score 3    Tests:   Pulmonary tests:  PFT 12/21/16 >> FEV1 2.40 (69%), FEV1% 74, TLC 4.95 (73%), DLCO 77%, +BD  Sleep tests:  HST 04/20/15 >> AHI 40 Auto Bipap 08/08/18 to 11/05/18 >> used on 59 of 90 nights with average 5 hrs 17 min.  Average AHI 3 with median Bipap 14/10 and 95 th Bipap 17/13 cm H2O  FENO:  No results found for: NITRICOXIDE  PFT: No flowsheet data found.  WALK:  SIX MIN WALK 04/10/2019  Supplimental Oxygen during Test? (L/min) No    Imaging: DG Chest 2 View  Result Date: 04/10/2019 CLINICAL DATA:  Shortness of breath. EXAM: CHEST - 2 VIEW COMPARISON:  None. FINDINGS: The heart size and mediastinal contours are within normal limits. Both lungs are clear. No pneumothorax or pleural effusion is noted. The visualized skeletal structures are unremarkable. IMPRESSION: No active cardiopulmonary disease. Electronically Signed   By: Marijo Conception M.D.   On: 04/10/2019 10:23    Lab Results:  CBC No results found for: WBC, RBC, HGB, HCT, PLT, MCV, MCH, MCHC, RDW, LYMPHSABS,  MONOABS, EOSABS, BASOSABS  BMET No results found for: NA, K, CL, CO2, GLUCOSE, BUN, CREATININE, CALCIUM, GFRNONAA, GFRAA  BNP No results found for: BNP  ProBNP No results found for: PROBNP  Specialty Problems      Pulmonary Problems   COPD (chronic obstructive pulmonary disease) (HCC)   OSA treated with BiPAP   Allergic rhinitis   Moderate persistent asthma without complication   Shortness of breath      No Known Allergies  Immunization History  Administered Date(s) Administered  . Fluad Quad(high Dose 65+) 09/18/2018  . PFIZER SARS-COV-2 Vaccination 02/20/2019,  03/17/2019  . Pneumococcal Polysaccharide-23 01/08/2015  . Pneumococcal-Unspecified 12/15/2017  . Tdap 09/17/2016    Past Medical History:  Diagnosis Date  . Asthma   . Bilateral cataracts   . ED (erectile dysfunction)   . Essential tremor    with nerve stimulator  . GERD (gastroesophageal reflux disease)   . Insomnia   . OSA treated with BiPAP   . Right carpal tunnel syndrome     Tobacco History: Social History   Tobacco Use  Smoking Status Former Smoker  . Packs/day: 3.00  . Years: 20.00  . Pack years: 60.00  . Types: Cigarettes  . Quit date: 01/15/1989  . 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 albuterol (PROVENTIL) (2.5 MG/3ML) 0.083% nebulizer solution Take 3 mLs (2.5 mg total) by nebulization every 6 (six) hours.  . fluticasone furoate-vilanterol (BREO ELLIPTA) 200-25 MCG/INH AEPB Inhale 1 puff into the lungs daily.  . hydrOXYzine (ATARAX/VISTARIL) 10 MG tablet Take 1 tablet (10 mg total) by mouth 3 (three) times daily as needed.  . montelukast (SINGULAIR) 10 MG tablet Take 1 tablet (10 mg total) by mouth at bedtime.  . mupirocin ointment (BACTROBAN) 2 %   . tadalafil (CIALIS) 10 MG tablet Take 1 tablet (10 mg total) by mouth daily as needed for erectile dysfunction.  . traZODone (DESYREL) 100 MG tablet Take 1 tablet (100 mg total) by mouth at bedtime.  . Zolpidem Tartrate 1.75 MG SUBL Place 1.75 mg under the tongue at bedtime.  . [DISCONTINUED] ondansetron (ZOFRAN) 4 MG tablet Take 1 tablet (4 mg total) by mouth every 8 (eight) hours as needed for nausea or vomiting.  . [DISCONTINUED] oxyCODONE-acetaminophen (PERCOCET) 10-325 MG tablet Take 1 tablet by mouth every 4 (four) hours as needed for pain.   No facility-administered encounter medications on file as of 04/10/2019.     Review of Systems  Review of Systems    Constitutional: Positive for fatigue. Negative for activity change, chills, fever and unexpected weight change.  HENT: Positive for congestion. Negative for postnasal drip, rhinorrhea, sinus pressure, sinus pain and sore throat.   Eyes: Negative.   Respiratory: Positive for cough (dry cough ) and shortness of breath. Negative for wheezing.   Cardiovascular: Positive for leg swelling. Negative for chest pain and palpitations.  Gastrointestinal: Negative for constipation, diarrhea, nausea and vomiting.  Endocrine: Negative.   Genitourinary: Negative.   Musculoskeletal: Negative.   Skin: Negative.   Neurological: Negative for dizziness and headaches.  Psychiatric/Behavioral: Negative.  Negative for dysphoric mood. The patient is not nervous/anxious.   All other systems reviewed and are negative.    Physical Exam  BP 118/72 (BP Location: Left Arm, Patient Position: Sitting, Cuff Size: Normal)   Pulse 79   Temp 98.4 F (36.9 C) (  Temporal)   Ht 5\' 8"  (1.727 m)   Wt 246 lb 3.2 oz (111.7 kg)   SpO2 96% Comment: on RA  BMI 37.43 kg/m   Wt Readings from Last 5 Encounters:  04/10/19 246 lb 3.2 oz (111.7 kg)  03/27/19 243 lb 12.8 oz (110.6 kg)  11/06/18 245 lb 12.8 oz (111.5 kg)  10/30/18 244 lb 6.4 oz (110.9 kg)  10/16/18 233 lb 3.2 oz (105.8 kg)    BMI Readings from Last 5 Encounters:  04/10/19 37.43 kg/m  03/27/19 37.07 kg/m  11/06/18 37.37 kg/m  10/30/18 37.16 kg/m  10/16/18 33.46 kg/m     Physical Exam Vitals and nursing note reviewed.  Constitutional:      General: He is not in acute distress.    Appearance: Normal appearance. He is obese.  HENT:     Head: Normocephalic and atraumatic.     Right Ear: Hearing, tympanic membrane, ear canal and external ear normal.     Left Ear: Hearing, tympanic membrane, ear canal and external ear normal.     Nose: Congestion and rhinorrhea present. No mucosal edema.     Right Turbinates: Not enlarged.     Left Turbinates: Not  enlarged.     Mouth/Throat:     Mouth: Mucous membranes are dry.     Pharynx: Oropharynx is clear. No oropharyngeal exudate.     Comments: PND Eyes:     Pupils: Pupils are equal, round, and reactive to light.  Cardiovascular:     Rate and Rhythm: Normal rate and regular rhythm.     Pulses: Normal pulses.     Heart sounds: Normal heart sounds. No murmur.  Pulmonary:     Effort: Pulmonary effort is normal.     Breath sounds: Normal breath sounds. No decreased breath sounds, wheezing or rales.  Abdominal:     General: Bowel sounds are normal. There is no distension.     Palpations: Abdomen is soft.     Tenderness: There is no abdominal tenderness.     Comments: Truncal obesity  Musculoskeletal:     Cervical back: Normal range of motion.     Right lower leg: Edema (Trace) present.     Left lower leg: No edema (Trace).  Lymphadenopathy:     Cervical: No cervical adenopathy.  Skin:    General: Skin is warm and dry.     Capillary Refill: Capillary refill takes less than 2 seconds.     Findings: No erythema or rash.  Neurological:     General: No focal deficit present.     Mental Status: He is alert and oriented to person, place, and time.     Motor: No weakness.     Coordination: Coordination normal.     Gait: Gait is intact. Gait normal.  Psychiatric:        Mood and Affect: Mood normal.        Behavior: Behavior normal. Behavior is cooperative.        Thought Content: Thought content normal.        Judgment: Judgment normal.       Assessment & Plan:   Allergic rhinitis Plan: Start daily and histamine Start nasal saline rinses Continue Singulair Lab work today  Moderate persistent asthma without complication Likely more asthma COPD overlap syndrome given patient's pack-year history  Plan: Continue Breo Ellipta 200 Continue Singulair Walk today in office stable without any oxygen desaturations Chest x-ray today Lab work today May need to consider repeat  pulmonary  function testing if patient symptoms persist.  OSA treated with BiPAP Somewhat improved compliance over the last couple of days Did not further discuss due to patient's acute shortness of breath  Plan: We will discuss that further office visits Continue BiPAP  Shortness of breath Suspect patient shortness of breath is likely multifactorial.  Patient has known restrictive lung disease, asthma, just had a stressful work over the past 4 weeks with a prostate biopsy, noncancerous, baseline anxiety.  Walk today in office was stable without any oxygen desaturations.  Vital signs are stable today.  Plan: Chest x-ray today Baseline lab work today May need to consider repeat pulmonary function testing We will see patient back in about 2 weeks    Return in about 2 weeks (around 04/24/2019), or if symptoms worsen or fail to improve, for Follow up with Wyn Quaker FNP-C.   [name], NP 04/10/2019   This appointment required 34 minutes of patient care (this includes precharting, chart review, review of results, face-to-face care, etc.).

## 2019-04-10 ENCOUNTER — Encounter: Payer: Self-pay | Admitting: Pulmonary Disease

## 2019-04-10 ENCOUNTER — Ambulatory Visit (INDEPENDENT_AMBULATORY_CARE_PROVIDER_SITE_OTHER): Payer: Medicare HMO

## 2019-04-10 ENCOUNTER — Other Ambulatory Visit: Payer: Self-pay

## 2019-04-10 ENCOUNTER — Ambulatory Visit: Payer: Medicare HMO | Admitting: Pulmonary Disease

## 2019-04-10 VITALS — BP 118/72 | HR 79 | Temp 98.4°F | Ht 68.0 in | Wt 246.2 lb

## 2019-04-10 DIAGNOSIS — J309 Allergic rhinitis, unspecified: Secondary | ICD-10-CM | POA: Diagnosis not present

## 2019-04-10 DIAGNOSIS — R0602 Shortness of breath: Secondary | ICD-10-CM

## 2019-04-10 DIAGNOSIS — J4540 Moderate persistent asthma, uncomplicated: Secondary | ICD-10-CM

## 2019-04-10 DIAGNOSIS — G4733 Obstructive sleep apnea (adult) (pediatric): Secondary | ICD-10-CM

## 2019-04-10 LAB — CBC WITH DIFFERENTIAL/PLATELET
Basophils Absolute: 0.1 10*3/uL (ref 0.0–0.1)
Basophils Relative: 0.8 % (ref 0.0–3.0)
Eosinophils Absolute: 0.2 10*3/uL (ref 0.0–0.7)
Eosinophils Relative: 2.4 % (ref 0.0–5.0)
HCT: 39.5 % (ref 39.0–52.0)
Hemoglobin: 13 g/dL (ref 13.0–17.0)
Lymphocytes Relative: 17.8 % (ref 12.0–46.0)
Lymphs Abs: 1.3 10*3/uL (ref 0.7–4.0)
MCHC: 32.8 g/dL (ref 30.0–36.0)
MCV: 95.8 fl (ref 78.0–100.0)
Monocytes Absolute: 0.6 10*3/uL (ref 0.1–1.0)
Monocytes Relative: 8.3 % (ref 3.0–12.0)
Neutro Abs: 5.1 10*3/uL (ref 1.4–7.7)
Neutrophils Relative %: 70.7 % (ref 43.0–77.0)
Platelets: 267 10*3/uL (ref 150.0–400.0)
RBC: 4.13 Mil/uL — ABNORMAL LOW (ref 4.22–5.81)
RDW: 15 % (ref 11.5–15.5)
WBC: 7.2 10*3/uL (ref 4.0–10.5)

## 2019-04-10 LAB — BRAIN NATRIURETIC PEPTIDE: Pro B Natriuretic peptide (BNP): 33 pg/mL (ref 0.0–100.0)

## 2019-04-10 LAB — COMPREHENSIVE METABOLIC PANEL
ALT: 22 U/L (ref 0–53)
AST: 18 U/L (ref 0–37)
Albumin: 4.2 g/dL (ref 3.5–5.2)
Alkaline Phosphatase: 55 U/L (ref 39–117)
BUN: 21 mg/dL (ref 6–23)
CO2: 26 mEq/L (ref 19–32)
Calcium: 9.2 mg/dL (ref 8.4–10.5)
Chloride: 106 mEq/L (ref 96–112)
Creatinine, Ser: 1.01 mg/dL (ref 0.40–1.50)
GFR: 73.3 mL/min (ref >60.00)
Glucose, Bld: 108 mg/dL — ABNORMAL HIGH (ref 70–99)
Potassium: 4.3 mEq/L (ref 3.5–5.1)
Sodium: 138 mEq/L (ref 135–145)
Total Bilirubin: 0.3 mg/dL (ref 0.2–1.2)
Total Protein: 6.6 g/dL (ref 6.0–8.3)

## 2019-04-10 NOTE — Assessment & Plan Note (Signed)
Plan: Start daily and histamine Start nasal saline rinses Continue Singulair Lab work today

## 2019-04-10 NOTE — Assessment & Plan Note (Signed)
Suspect patient shortness of breath is likely multifactorial.  Patient has known restrictive lung disease, asthma, just had a stressful work over the past 4 weeks with a prostate biopsy, noncancerous, baseline anxiety.  Walk today in office was stable without any oxygen desaturations.  Vital signs are stable today.  Plan: Chest x-ray today Baseline lab work today May need to consider repeat pulmonary function testing We will see patient back in about 2 weeks

## 2019-04-10 NOTE — Patient Instructions (Addendum)
You were seen today by [name], NP  for:   It was nice meeting you today in office.  Thank you for coming in.  We will get a chest x-ray and we will also do a walk.  To further evaluate this chest tightness/shortness of breath.  Continue your inhalers at this time.  We will also obtain baseline lab work today.  We may need to consider repeating pulmonary function test based off of your lab work/x-ray/symptoms.  We can see you back in 2 weeks to ensure symptoms are improving, or sooner if needed.  Take care and stay safe,  Allen [name]  1. Shortness of breath  Walk today in office Chest x-ray today in office  Labwork   We may need to consider repeating pulmonary function testing if your symptoms continue to persist  2. Moderate persistent asthma without complication  Breo Ellipta 200 >>> Take 1 puff daily in the morning right when you wake up >>>Rinse your mouth out after use >>>This is a daily maintenance inhaler, NOT a rescue inhaler >>>Contact our office if you are having difficulties affording or obtaining this medication >>>It is important for you to be able to take this daily and not miss any doses    Only use your albuterol as a rescue medication to be used if you can't catch your breath by resting or doing a relaxed purse lip breathing pattern.  - The less you use it, the better it will work when you need it. - Ok to use up to 2 puffs  every 4 hours if you must but call for immediate appointment if use goes up over your usual need - Don't leave home without it !!  (think of it like the spare tire for your car)    3. OSA treated with BiPAP  We recommend that you continue using your CPAP daily >>>Keep up the hard work using your device >>> Goal should be wearing this for the entire night that you are sleeping, at least 4 to 6 hours  Remember:  . Do not drive or operate heavy machinery if tired or drowsy.  . Please notify the supply company and office if you are unable to  use your device regularly due to missing supplies or machine being broken.  . Work on maintaining a healthy weight and following your recommended nutrition plan  . Maintain proper daily exercise and movement  . Maintaining proper use of your device can also help improve management of other chronic illnesses such as: Blood pressure, blood sugars, and weight management.   BiPAP/ CPAP Cleaning:  >>>Clean weekly, with Dawn soap, and bottle brush.  Set up to air dry. >>> Wipe mask out daily with wet wipe or towelette    4. Allergic rhinitis, unspecified seasonality, unspecified trigger  Continue Singulair  Please start taking a daily antihistamine:  >>>choose one of: zyrtec, claritin, allegra, or xyzal  >>>these are over the counter medications  >>>can choose generic option  >>>take daily  >>>this medication helps with allergies, post nasal drip, and cough   I would recommend that you start nasal saline rinses 1-2 times daily Use distilled water Get water lukewarm like a baby bottle Shake well Sample provided today   Follow Up:    Return in about 2 weeks (around 04/24/2019), or if symptoms worsen or fail to improve, for Follow up with Allen Quaker FNP-C.   Please do your part to reduce the spread of COVID-19:  Reduce your risk of any infection  and COVID19 by using the similar precautions used for avoiding the common cold or flu:  [name] Kitchen Wash your hands often with soap and warm water for at least 20 seconds.  If soap and water are not readily available, use an alcohol-based hand sanitizer with at least 60% alcohol.  . If coughing or sneezing, cover your mouth and nose by coughing or sneezing into the elbow areas of your shirt or coat, into a tissue or into your sleeve (not your hands). Langley Gauss A MASK when in public  . Avoid shaking hands with others and consider head nods or verbal greetings only. . Avoid touching your eyes, nose, or mouth with unwashed hands.  . Avoid close  contact with people who are sick. . Avoid places or events with large numbers of people in one location, like concerts or sporting events. . If you have some symptoms but not all symptoms, continue to monitor at home and seek medical attention if your symptoms worsen. . If you are having a medical emergency, call 911.   [place] / e-Visit: [email or web address]         [facility]: [facility]: [id number]                   [facility]: [id number]     It is flu season:   >>> Best ways to protect herself from the flu: Receive the yearly flu vaccine, practice good hand hygiene washing with soap and also using hand sanitizer when available, eat a nutritious meals, get adequate rest, hydrate appropriately   Please contact the office if your symptoms worsen or you have concerns that you are not improving.   Thank you for choosing [facility] for your healthcare, and for allowing [place] to partner with you on your healthcare journey. I am thankful to be able to provide care to you today.   Allen Quaker FNP-C

## 2019-04-10 NOTE — Assessment & Plan Note (Signed)
Somewhat improved compliance over the last couple of days Did not further discuss due to patient's acute shortness of breath  Plan: We will discuss that further office visits Continue BiPAP

## 2019-04-10 NOTE — Progress Notes (Signed)
Reviewed and agree with assessment/plan.   Lakenya Riendeau, MD [facility] 01/11/2016, 12:24 PM Pager:  [phone]  

## 2019-04-10 NOTE — Assessment & Plan Note (Signed)
Likely more asthma COPD overlap syndrome given patient's pack-year history  Plan: Continue Breo Ellipta 200 Continue Singulair Walk today in office stable without any oxygen desaturations Chest x-ray today Lab work today May need to consider repeat pulmonary function testing if patient symptoms persist.

## 2019-04-13 LAB — IGE: IgE (Immunoglobulin E), Serum: 66 kU/L (ref <=114)

## 2019-04-14 DIAGNOSIS — R0602 Shortness of breath: Secondary | ICD-10-CM

## 2019-04-14 NOTE — Telephone Encounter (Signed)
PFT has been scheduled for 4/5 at noon. With OV on 4/12.

## 2019-04-14 NOTE — Telephone Encounter (Signed)
Allen [name], Received a message from patient asking for his PFT to be scheduled asap. Looks like in the notes from his visit with you the other day it says May need to consider repeat pulmonary function testing.  Do you want him to have this done or wait?

## 2019-04-14 NOTE — Telephone Encounter (Signed)
Result note states to order the pulmonary function test and get him scheduled.  Yes please schedule him as soon as possible to evaluate his shortness of breath.Wyn Quaker, FNP

## 2019-04-15 DIAGNOSIS — R350 Frequency of micturition: Secondary | ICD-10-CM | POA: Diagnosis not present

## 2019-04-15 DIAGNOSIS — N401 Enlarged prostate with lower urinary tract symptoms: Secondary | ICD-10-CM | POA: Diagnosis not present

## 2019-04-15 DIAGNOSIS — R310 Gross hematuria: Secondary | ICD-10-CM | POA: Diagnosis not present

## 2019-04-17 ENCOUNTER — Other Ambulatory Visit (HOSPITAL_COMMUNITY)
Admission: RE | Admit: 2019-04-17 | Discharge: 2019-04-17 | Disposition: A | Discharge status: Home / Self Care | Payer: Medicare HMO | Source: Ambulatory Visit | Attending: Pulmonary Disease | Admitting: Pulmonary Disease

## 2019-04-17 DIAGNOSIS — Z20822 Contact with and (suspected) exposure to covid-19: Secondary | ICD-10-CM | POA: Diagnosis not present

## 2019-04-17 DIAGNOSIS — Z01812 Encounter for preprocedural laboratory examination: Secondary | ICD-10-CM | POA: Insufficient documentation

## 2019-04-17 LAB — SARS CORONAVIRUS 2 (TAT 6-24 HRS): SARS Coronavirus 2: NEGATIVE

## 2019-04-20 ENCOUNTER — Ambulatory Visit (INDEPENDENT_AMBULATORY_CARE_PROVIDER_SITE_OTHER): Payer: Medicare HMO | Admitting: Pulmonary Disease

## 2019-04-20 ENCOUNTER — Other Ambulatory Visit: Payer: Self-pay

## 2019-04-20 DIAGNOSIS — R0602 Shortness of breath: Secondary | ICD-10-CM | POA: Diagnosis not present

## 2019-04-20 LAB — PULMONARY FUNCTION TEST
DL/VA % pred: 144 %
DL/VA: 5.9 ml/min/mmHg/L
DLCO cor % pred: 98 %
DLCO cor: 25.89 ml/min/mmHg
DLCO unc % pred: 93 %
DLCO unc: 24.64 ml/min/mmHg
FEF 25-75 Post: 1.15 L/sec
FEF 25-75 Pre: 1.51 L/sec
FEF2575-%Change-Post: -23 %
FEF2575-%Pred-Post: 44 %
FEF2575-%Pred-Pre: 58 %
FEV1-%Change-Post: -3 %
FEV1-%Pred-Post: 60 %
FEV1-%Pred-Pre: 62 %
FEV1-Post: 2.01 L
FEV1-Pre: 2.09 L
FEV1FVC-%Change-Post: 2 %
FEV1FVC-%Pred-Pre: 100 %
FEV6-%Change-Post: -6 %
FEV6-%Pred-Post: 61 %
FEV6-%Pred-Pre: 65 %
FEV6-Post: 2.62 L
FEV6-Pre: 2.79 L
FEV6FVC-%Change-Post: 0 %
FEV6FVC-%Pred-Post: 104 %
FEV6FVC-%Pred-Pre: 104 %
FVC-%Change-Post: -5 %
FVC-%Pred-Post: 59 %
FVC-%Pred-Pre: 62 %
FVC-Post: 2.66 L
FVC-Pre: 2.82 L
Post FEV1/FVC ratio: 76 %
Post FEV6/FVC ratio: 99 %
Pre FEV1/FVC ratio: 74 %
Pre FEV6/FVC Ratio: 99 %
RV % pred: 94 %
RV: 2.28 L
TLC % pred: 75 %
TLC: 5.33 L

## 2019-04-20 NOTE — Progress Notes (Signed)
PFT done today. 

## 2019-04-25 DIAGNOSIS — G4733 Obstructive sleep apnea (adult) (pediatric): Secondary | ICD-10-CM | POA: Diagnosis not present

## 2019-04-27 ENCOUNTER — Telehealth: Payer: Self-pay | Admitting: Pulmonary Disease

## 2019-04-27 ENCOUNTER — Encounter: Payer: Self-pay | Admitting: Pulmonary Disease

## 2019-04-27 ENCOUNTER — Other Ambulatory Visit: Payer: Self-pay

## 2019-04-27 ENCOUNTER — Ambulatory Visit: Payer: Medicare HMO | Admitting: Pulmonary Disease

## 2019-04-27 VITALS — BP 130/80 | HR 80 | Temp 97.3°F | Ht 70.0 in | Wt 249.0 lb

## 2019-04-27 DIAGNOSIS — R0602 Shortness of breath: Secondary | ICD-10-CM | POA: Diagnosis not present

## 2019-04-27 DIAGNOSIS — K219 Gastro-esophageal reflux disease without esophagitis: Secondary | ICD-10-CM

## 2019-04-27 DIAGNOSIS — J449 Chronic obstructive pulmonary disease, unspecified: Secondary | ICD-10-CM | POA: Diagnosis not present

## 2019-04-27 DIAGNOSIS — G4733 Obstructive sleep apnea (adult) (pediatric): Secondary | ICD-10-CM

## 2019-04-27 MED ORDER — OMEPRAZOLE 20 MG PO CPDR: 20.0000 mg | DELAYED_RELEASE_CAPSULE | Freq: Every day | ORAL | 4 refills | Status: DC

## 2019-04-27 MED ORDER — TRELEGY ELLIPTA 100-62.5-25 MCG/INH IN AEPB: 1.0000 | INHALATION_SPRAY | Freq: Every day | RESPIRATORY_TRACT | 3 refills | Status: DC

## 2019-04-27 MED ORDER — TRELEGY ELLIPTA 100-62.5-25 MCG/INH IN AEPB: 1.0000 | INHALATION_SPRAY | Freq: Every day | RESPIRATORY_TRACT | 0 refills | Status: DC

## 2019-04-27 MED ORDER — PREDNISONE 10 MG PO TABS: ORAL_TABLET | ORAL | 0 refills | Status: DC

## 2019-04-27 MED ORDER — AZITHROMYCIN 250 MG PO TABS: ORAL_TABLET | ORAL | 0 refills | Status: DC

## 2019-04-27 NOTE — Telephone Encounter (Signed)
Spoke with pt and he states [facility] didn't get the rx for Trelegy. It looks like Mandi sent this into the pharmacy but pt states they are faxing something to [place]. Mandi please look out for this from the fax. Thanks

## 2019-04-27 NOTE — Progress Notes (Signed)
Reviewed and agree with assessment/plan.   Tiffannie Sloss, MD [facility] 01/11/2016, 12:24 PM Pager:  [phone]  

## 2019-04-27 NOTE — Assessment & Plan Note (Signed)
Poor compliance directly related to coughing at night  Plan: Continue BiPAP

## 2019-04-27 NOTE — Procedures (Signed)
    The patient was desensitized to BILEVEL of 17/13cm H20 with the use of a [employer] nasal/oral mask.  The patient tolerated the trial well with no noted complications. The trial mask was given to the patient for home use.

## 2019-04-27 NOTE — Progress Notes (Signed)
@Patient  ID: Allen [name], male    DOB: Jul 30, 1950, 69 y.o.   MRN: [id number]  Chief Complaint  Patient presents with  . Follow-up    Patient did PFT on 4/5 and states that it made his breathing much worse for the rest of the week and just feels like he has a heaviness in chest. Patient states that even when he was sitting and resting he had a lingering dry cough.     Referring provider: Isaac Bliss, [place]*  HPI:  69 year old male former smoker followed in our office for asthma and obstructive sleep apnea  PMH: GERD, tremor, insomnia Smoker/ Smoking History: Former smoker.  Quit 1991.  60-pack-year smoking history. Maintenance: Breo Ellipta 200 Pt of: Dr. Halford Chessman  04/27/2019  - Visit   65 -year-old male former smoker followed in our office for asthma, dyspnea on exertion and obstructive sleep apnea.  Patient completing a 2-week follow-up from his last office visit which was on 320 06/2019.  At that time patient was evaluated for shortness of breath on exertion.  And a pulmonary function test was scheduled.  Patient was suspected restrictive lung disease based off of previous documentation from PFTs done in [place].  An x-ray was also performed as well as baseline lab work.  Those results are listed below:  04/10/2019-CBC with differential-hemoglobin 13, eosinophils relative 2.4, eosinophils absolute 0.2 04/10/2019-IgE-66 04/10/2019-BNP-33 04/10/2019-chest x-ray-no active cardiopulmonary disease 04/20/2019-pulmonary function test-FVC 2.82 (62% predicted), postbronchodilator ratio 76, FEV1 2.0 (60% predicted), no bronchodilator response, DLCO 24.64 (93% predicted) Patient did use Breo Ellipta prior to exam. Spirometry is suggestive of combined obstructive and restrictive defect, mild restriction confirmed with lung volume testing, normal diffusion capacity.  Patient presenting to office today reporting that he continues to have significant shortness of breath with physical exertion.   He reports this with walking even just by going upstairs.  He reports that he occasionally has shortness of breath even just sitting and talking.  He continues to also have a dry cough.  This dry cough is worse at night.  He does have a history of esophageal reflux.  He is not currently on any sort of maintenance therapies.  He reports that this was stopped a year ago.  He does not feel that he is having breakthrough reflux symptoms.  Patient was also evaluated by cardiology in [place] for the patient.  He reports that he had a negative cath as well as a negative stress test for evaluation of his dyspnea.  Questionaires / Pulmonary Flowsheets:   MMRC: mMRC Dyspnea Scale mMRC Score  04/27/2019 3  04/10/2019 0    Epworth:  Results of the Epworth flowsheet 10/16/2018  Sitting and reading 1  Watching TV 1  Sitting, inactive in a public place (e.g. a theatre or a meeting) 0  As a passenger in a car for an hour without a break 0  Lying down to rest in the afternoon when circumstances permit 1  Sitting and talking to someone 0  Sitting quietly after a lunch without alcohol 0  In a car, while stopped for a few minutes in traffic 0  Total score 3    Tests:   Pulmonary tests:  PFT 12/21/16 >> FEV1 2.40 (69%), FEV1% 74, TLC 4.95 (73%), DLCO 77%, +BD  Sleep tests:  HST 04/20/15 >> AHI 40 Auto Bipap 08/08/18 to 11/05/18 >> used on 59 of 90 nights with average 5 hrs 17 min.  Average AHI 3 with median Bipap 14/10 and  95 th Bipap 17/13 cm H2O  04/10/2019-CBC with differential-hemoglobin 13, eosinophils relative 2.4, eosinophils absolute 0.2 04/10/2019-IgE-66 04/10/2019-BNP-33 04/10/2019-chest x-ray-no active cardiopulmonary disease 04/20/2019-pulmonary function test-FVC 2.82 (62% predicted), postbronchodilator ratio 76, FEV1 2.0 (60% predicted), no bronchodilator response, DLCO 24.64 (93% predicted) Patient did use Breo Ellipta prior to exam. Spirometry is suggestive of combined obstructive and  restrictive defect, mild restriction confirmed with lung volume testing, normal diffusion capacity.   FENO:  No results found for: NITRICOXIDE  PFT: PFT Results Latest Ref Rng & Units 04/20/2019  FVC-Pre L 2.82  FVC-Predicted Pre % 62  FVC-Post L 2.66  FVC-Predicted Post % 59  Pre FEV1/FVC % % 74  Post FEV1/FCV % % 76  FEV1-Pre L 2.09  FEV1-Predicted Pre % 62  FEV1-Post L 2.01  DLCO UNC% % 93  DLCO COR %Predicted % 144  TLC L 5.33  TLC % Predicted % 75  RV % Predicted % 94    WALK:  SIX MIN WALK 04/10/2019  Supplimental Oxygen during Test? (L/min) No    Imaging: DG Chest 2 View  Result Date: 04/10/2019 CLINICAL DATA:  Shortness of breath. EXAM: CHEST - 2 VIEW COMPARISON:  None. FINDINGS: The heart size and mediastinal contours are within normal limits. Both lungs are clear. No pneumothorax or pleural effusion is noted. The visualized skeletal structures are unremarkable. IMPRESSION: No active cardiopulmonary disease. Electronically Signed   By: Marijo Conception M.D.   On: 04/10/2019 10:23    Lab Results:  CBC    Component Value Date/Time   WBC 7.2 04/10/2019 0941   RBC 4.13 (L) 04/10/2019 0941   HGB 13.0 04/10/2019 0941   HCT 39.5 04/10/2019 0941   PLT 267.0 04/10/2019 0941   MCV 95.8 04/10/2019 0941   MCHC 32.8 04/10/2019 0941   RDW 15.0 04/10/2019 0941   LYMPHSABS 1.3 04/10/2019 0941   MONOABS 0.6 04/10/2019 0941   EOSABS 0.2 04/10/2019 0941   BASOSABS 0.1 04/10/2019 0941    BMET    Component Value Date/Time   NA 138 04/10/2019 0941   K 4.3 04/10/2019 0941   CL 106 04/10/2019 0941   CO2 26 04/10/2019 0941   GLUCOSE 108 (H) 04/10/2019 0941   BUN 21 04/10/2019 0941   CREATININE 1.01 04/10/2019 0941   CALCIUM 9.2 04/10/2019 0941    BNP No results found for: BNP  ProBNP    Component Value Date/Time   PROBNP 33.0 04/10/2019 0941    Specialty Problems      Pulmonary Problems   COPD (chronic obstructive pulmonary disease) (HCC)   OSA treated with  BiPAP   Allergic rhinitis   Moderate persistent asthma without complication   Shortness of breath      No Known Allergies  Immunization History  Administered Date(s) Administered  . Fluad Quad(high Dose 65+) 09/18/2018  . PFIZER SARS-COV-2 Vaccination 02/20/2019, 03/17/2019  . Pneumococcal Polysaccharide-23 01/08/2015  . Pneumococcal-Unspecified 12/15/2017  . Tdap 09/17/2016    Past Medical History:  Diagnosis Date  . Asthma   . Bilateral cataracts   . ED (erectile dysfunction)   . Essential tremor    with nerve stimulator  . GERD (gastroesophageal reflux disease)   . Insomnia   . OSA treated with BiPAP   . Right carpal tunnel syndrome     Tobacco History: Social History   Tobacco Use  Smoking Status Former Smoker  . Packs/day: 3.00  . Years: 20.00  . Pack years: 60.00  . Types: Cigarettes  . Quit date:  01/15/1989  . 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 albuterol (PROVENTIL) (2.5 MG/3ML) 0.083% nebulizer solution Take 3 mLs (2.5 mg total) by nebulization every 6 (six) hours.  . fluticasone furoate-vilanterol (BREO ELLIPTA) 200-25 MCG/INH AEPB Inhale 1 puff into the lungs daily.  . hydrOXYzine (ATARAX/VISTARIL) 10 MG tablet Take 1 tablet (10 mg total) by mouth 3 (three) times daily as needed.  . montelukast (SINGULAIR) 10 MG tablet Take 1 tablet (10 mg total) by mouth at bedtime.  . mupirocin ointment (BACTROBAN) 2 %   . tadalafil (CIALIS) 10 MG tablet Take 1 tablet (10 mg total) by mouth daily as needed for erectile dysfunction.  . traZODone (DESYREL) 100 MG tablet Take 1 tablet (100 mg total) by mouth at bedtime.  . Zolpidem Tartrate 1.75 MG SUBL Place 1.75 mg under the tongue at bedtime.  [name] Kitchen azithromycin (ZITHROMAX) 250 MG tablet 500mg  (two tablets) today, then 250mg  (1 tablet) for the next 4 days  .  Fluticasone-Umeclidin-Vilant (TRELEGY ELLIPTA) 100-62.5-25 MCG/INH AEPB Inhale 1 puff into the lungs daily.  . Fluticasone-Umeclidin-Vilant (TRELEGY ELLIPTA) 100-62.5-25 MCG/INH AEPB Inhale 1 puff into the lungs daily.  [name] Kitchen omeprazole (PRILOSEC) 20 MG capsule Take 1 capsule (20 mg total) by mouth daily.  . predniSONE (DELTASONE) 10 MG tablet 4 tabs for 2 days, then 3 tabs for 2 days, 2 tabs for 2 days, then 1 tab for 2 days, then stop   No facility-administered encounter medications on file as of 04/27/2019.     Review of Systems  Review of Systems  Constitutional: Positive for fatigue. Negative for activity change, chills, fever and unexpected weight change.  HENT: Negative for postnasal drip, rhinorrhea, sinus pressure, sinus pain and sore throat.   Eyes: Negative.   Respiratory: Positive for cough (dry cough - more at night ) and shortness of breath. Negative for wheezing.   Cardiovascular: Negative for chest pain and palpitations.  Gastrointestinal: Negative for constipation, diarrhea, nausea and vomiting.  Endocrine: Negative.   Genitourinary: Negative.   Musculoskeletal: Negative.   Skin: Negative.   Neurological: Negative for dizziness and headaches.  Psychiatric/Behavioral: Negative.  Negative for dysphoric mood. The patient is not nervous/anxious.   All other systems reviewed and are negative.    Physical Exam  BP 130/80 (BP Location: Left Arm, Patient Position: Sitting, Cuff Size: Large)   Pulse 80   Temp (!) 97.3 F (36.3 C) (Temporal)   Ht 5\' 10"  (1.778 m)   Wt 249 lb (112.9 kg)   SpO2 97%   BMI 35.73 kg/m   Wt Readings from Last 5 Encounters:  04/27/19 249 lb (112.9 kg)  04/10/19 246 lb 3.2 oz (111.7 kg)  03/27/19 243 lb 12.8 oz (110.6 kg)  11/06/18 245 lb 12.8 oz (111.5 kg)  10/30/18 244 lb 6.4 oz (110.9 kg)    BMI Readings from Last 5 Encounters:  04/27/19 35.73 kg/m  04/10/19 37.43 kg/m  03/27/19 37.07 kg/m  11/06/18 37.37 kg/m  10/30/18 37.16  kg/m     Physical Exam Vitals and nursing note reviewed.  Constitutional:      General: He is not in acute distress.    Appearance: Normal appearance. He is obese.  HENT:     Head: Normocephalic and atraumatic.     Right Ear: Hearing, tympanic membrane, ear canal and external ear normal.     Left Ear:  Hearing, tympanic membrane, ear canal and external ear normal.     Nose: Nose normal. No mucosal edema or rhinorrhea.     Right Turbinates: Not enlarged.     Left Turbinates: Not enlarged.     Mouth/Throat:     Mouth: Mucous membranes are dry.     Pharynx: Oropharynx is clear. No oropharyngeal exudate.  Eyes:     Pupils: Pupils are equal, round, and reactive to light.  Cardiovascular:     Rate and Rhythm: Normal rate and regular rhythm.     Pulses: Normal pulses.     Heart sounds: Normal heart sounds. No murmur.  Pulmonary:     Effort: Pulmonary effort is normal.     Breath sounds: Normal breath sounds. No decreased breath sounds, wheezing or rales.  Abdominal:     General: Bowel sounds are normal. There is no distension.     Palpations: Abdomen is soft.     Tenderness: There is no abdominal tenderness.     Comments: Obese  Musculoskeletal:     Cervical back: Normal range of motion.     Right lower leg: No edema.     Left lower leg: No edema.  Lymphadenopathy:     Cervical: No cervical adenopathy.  Skin:    General: Skin is warm and dry.     Capillary Refill: Capillary refill takes less than 2 seconds.     Findings: No erythema or rash.  Neurological:     General: No focal deficit present.     Mental Status: He is alert and oriented to person, place, and time.     Motor: No weakness.     Coordination: Coordination normal.     Gait: Gait is intact. Gait normal.  Psychiatric:        Mood and Affect: Mood normal.        Behavior: Behavior normal. Behavior is cooperative.        Thought Content: Thought content normal.        Judgment: Judgment normal.        Assessment & Plan:   COPD (chronic obstructive pulmonary disease) ([place]) Probably more asthma COPD overlap syndrome Mild eosinophilia as well as mildly elevated IgE on lab work 60-pack-year smoking history Pulmonary function testing shows mixed obstructive and restrictive lung disease, no diffusion defect  Plan: Stop [employer] Start Trelegy Ellipta Start omeprazole We will discuss your symptoms with Dr. Halford Chessman to see if we should proceed forward with CT imaging  OSA treated with BiPAP Poor compliance directly related to coughing at night  Plan: Continue BiPAP  GERD (gastroesophageal reflux disease) Worsened dry cough Dry cough worse at night History of GERD  Plan: Start omeprazole Follow GERD lifestyle recommendations   Shortness of breath Per patient he has had evaluation by cardiology in [place] with a negative stress test as well as negative cath BNP at last office visit was normal CBC with differential and IgE mildly elevated Pulmonary function testing shows mixed restrictive and obstructive lung disease History of smoking 60 pack years  Patient continues to have significant shortness of breath with physical exertion. No recent CT imaging on the patient, patient likely has emphysema, no diffusion defect on PFTs  Plan: Stop Breo Ellipta 200 Start Trelegy Ellipta May need to consider CT imaging, I will discuss this with Dr. Stood Increase physical activity the best you can to work to reduce BMI which is contributing to patient's known restriction on PFTs    Return in  about 2 months (around 06/27/2019), or if symptoms worsen or fail to improve, for Follow up with Dr. Halford Chessman, Follow up with Wyn Quaker FNP-C.   [name], NP 04/27/2019   This appointment required 32 minutes of patient care (this includes precharting, chart review, review of results, face-to-face care, etc.).

## 2019-04-27 NOTE — Assessment & Plan Note (Signed)
Probably more asthma COPD overlap syndrome Mild eosinophilia as well as mildly elevated IgE on lab work 60-pack-year smoking history Pulmonary function testing shows mixed obstructive and restrictive lung disease, no diffusion defect  Plan: Stop [employer] Start Trelegy Ellipta Start omeprazole We will discuss your symptoms with Dr. Halford Chessman to see if we should proceed forward with CT imaging

## 2019-04-27 NOTE — Progress Notes (Signed)
Assessment/Plan:    1.  Essential Tremor  -Status post DBS surgery in [place] in 2016.  Pt doesn't think that surgery helped  -DBS reprogrammed today.  It is clear that the DBS is helping some.  -Patient wants to retry medication.  We discussed various medications and ultimately decided to restart primidone, half tablet at bedtime for 1 week and then increase to 1 tablet nightly.  R/B/SE were discussed.  The opportunity to ask questions was given and they were answered to the best of my ability.  The patient expressed understanding and willingness to follow the outlined treatment protocols.  Subjective:   Allen [name] was seen today in follow up for essential tremor, s/p DBS in [place].  My previous records were reviewed prior to todays visit.  Last visit, I turned back on this patient's stimulator (he had it off as he felt the stimulator did not help) and reprogrammed his device.  He reports today that "it wasn't helpful.  Its just not working out for me."  Has trouble stirring coffee.  Pt denies falls.  Pt denies lightheadedness, near syncope.  No hallucinations.    Prior old records were reviewed.  Very few notes are received.  Last note was received that was dated July 22, 2018.  That was not the physician who did his DBS.  Since our last visit, the patient did have ankle surgery.  Those notes are reviewed.  I talked to his podiatrist as well prior to the surgery.  He had a repair of the tear of the peroneal tendon on the right.  He recovered well.  He follows with pulmonary for his sleep apnea and asthma.  Current prescribed movement disorder medications: n/a   PREVIOUS MEDICATIONS:  primidone, propranolol, gabapentin; not beta-blocker candidate now because of asthma  ALLERGIES:  No Known Allergies  CURRENT MEDICATIONS:  Outpatient Encounter Medications as of 04/30/2019  Medication Sig  . albuterol (PROVENTIL) (2.5 MG/3ML) 0.083% nebulizer solution Take 3 mLs (2.5 mg total) by  nebulization every 6 (six) hours.  [name] Kitchen azithromycin (ZITHROMAX) 250 MG tablet 500mg  (two tablets) today, then 250mg  (1 tablet) for the next 4 days  . fluticasone furoate-vilanterol (BREO ELLIPTA) 200-25 MCG/INH AEPB Inhale 1 puff into the lungs daily.  . Fluticasone-Umeclidin-Vilant (TRELEGY ELLIPTA) 100-62.5-25 MCG/INH AEPB Inhale 1 puff into the lungs daily.  . Fluticasone-Umeclidin-Vilant (TRELEGY ELLIPTA) 100-62.5-25 MCG/INH AEPB Inhale 1 puff into the lungs daily.  . hydrOXYzine (ATARAX/VISTARIL) 10 MG tablet Take 1 tablet (10 mg total) by mouth 3 (three) times daily as needed.  . montelukast (SINGULAIR) 10 MG tablet Take 1 tablet (10 mg total) by mouth at bedtime.  . mupirocin ointment (BACTROBAN) 2 %   . omeprazole (PRILOSEC) 20 MG capsule Take 1 capsule (20 mg total) by mouth daily.  . predniSONE (DELTASONE) 10 MG tablet 4 tabs for 2 days, then 3 tabs for 2 days, 2 tabs for 2 days, then 1 tab for 2 days, then stop  . tadalafil (CIALIS) 10 MG tablet Take 1 tablet (10 mg total) by mouth daily as needed for erectile dysfunction.  . traZODone (DESYREL) 100 MG tablet Take 1 tablet (100 mg total) by mouth at bedtime.  . Zolpidem Tartrate 1.75 MG SUBL Place 1.75 mg under the tongue at bedtime.   No facility-administered encounter medications on file as of 04/30/2019.     Objective:    PHYSICAL EXAMINATION:    VITALS:  There were no vitals filed for this visit.  GEN:  The patient appears stated age and is in NAD. HEENT:  Normocephalic, atraumatic.  The mucous membranes are moist. The superficial temporal arteries are without ropiness or tenderness. CV:  RRR Lungs:  CTAB Neck/HEME:  There are no carotid bruits bilaterally.  Neurological examination:  Orientation: The patient is alert and oriented x3. Cranial nerves: There is good facial symmetry. The speech is fluent and clear. Soft palate rises symmetrically and there is no tongue deviation. Hearing is intact to conversational  tone. Sensation: Sensation is intact to light touch throughout Motor: Strength is at least antigravity x4.  Movement examination: Tone: There is normal tone in the UE/LE Abnormal movements: Prior to programming, there is no rest tremor.  There is no postural tremor.  There really was no tremor when he had a light weight.  When he had a medium weight, I really did not see any tremor.  He did have moderate tremor when he was given a heavier weight.  I did spend a significant amount of time reprogramming his device today.  He felt better about it when we left, but still felt that he had some degree of tremor and wanted to try medication. Gait and Station: The patient has no difficulty arising out of a deep-seated chair without the use of the hands. The patient's stride length is good  I have reviewed and interpreted the following labs independently   Chemistry      Component Value Date/Time   NA 138 04/10/2019 0941   K 4.3 04/10/2019 0941   CL 106 04/10/2019 0941   CO2 26 04/10/2019 0941   BUN 21 04/10/2019 0941   CREATININE 1.01 04/10/2019 0941      Component Value Date/Time   CALCIUM 9.2 04/10/2019 0941   ALKPHOS 55 04/10/2019 0941   AST 18 04/10/2019 0941   ALT 22 04/10/2019 0941   BILITOT 0.3 04/10/2019 0941     Lab Results  Component Value Date   WBC 7.2 04/10/2019   HGB 13.0 04/10/2019   HCT 39.5 04/10/2019   MCV 95.8 04/10/2019   PLT 267.0 04/10/2019     Total time spent on today's visit was 20 minutes, including both face-to-face time and nonface-to-face time.  Time included that spent on review of records (prior notes available to me/labs/imaging if pertinent), discussing treatment and goals, answering patient's questions and coordinating care.  This did not include the amount of time spent in DBS programming, which is described in a separate programming visit.  Cc:  Isaac Bliss, Rayford Halsted, MD

## 2019-04-27 NOTE — Assessment & Plan Note (Signed)
Per patient he has had evaluation by cardiology in [place] with a negative stress test as well as negative cath BNP at last office visit was normal CBC with differential and IgE mildly elevated Pulmonary function testing shows mixed restrictive and obstructive lung disease History of smoking 60 pack years  Patient continues to have significant shortness of breath with physical exertion. No recent CT imaging on the patient, patient likely has emphysema, no diffusion defect on PFTs  Plan: Stop Breo Ellipta 200 Start Trelegy Ellipta May need to consider CT imaging, I will discuss this with Dr. Stood Increase physical activity the best you can to work to reduce BMI which is contributing to patient's known restriction on PFTs

## 2019-04-27 NOTE — Patient Instructions (Addendum)
You were seen today by [name], NP  for:   1. Shortness of breath  Unsure what to make of the shortness of breath.  I suspect it is likely a mix of both restriction which is difficulty taking deep breath in as well as sedentary lifestyle mixed with obstructive lung disease.  I believe your pulmonary function test does favor more COPD.  Especially with your 60-pack-year smoking history.  I will discuss this with Dr. Halford Chessman.  We may need to proceed forward with a CT of your chest to further evaluate given the severity of your symptoms as well as your reported negative work-up from cardiology  Consider pulmonary rehab when you return   2. Chronic obstructive pulmonary disease, unspecified COPD type (HCC)  Trial of Trelegy Ellipta  >>> 1 puff daily in the morning >>>rinse mouth out after use  >>> This inhaler contains 3 medications that help manage her respiratory status, contact our office if you cannot afford this medication or unable to remain on this medication  STOP BREO ELLIPTA   I have sent in a course of prednisone as well as a Z-Pak for when you travel to [place] later on this month.  If you have to use these please contact her office that way we can schedule follow-up with you.  Note your daily symptoms > remember "red flags" for COPD:   >>>Increase in cough >>>increase in sputum production >>>increase in shortness of breath or activity  intolerance.   If you notice these symptoms, please call the office to be seen.    3. Gastroesophageal reflux disease, unspecified whether esophagitis present  - omeprazole (PRILOSEC) 20 MG capsule; Take 1 capsule (20 mg total) by mouth daily.  Dispense: 30 capsule; Refill: 4  Omeprazole 20 mg tablet  >>>Please take 1 tablet daily 15 minutes to 30 minutes before your first meal of the day as well as before your other medications >>>Try to take at the same time each day >>>take this medication daily  GERD management: >>>Avoid laying  flat until 2 hours after meals >>>Elevate head of the bed including entire chest >>>Reduce size of meals and amount of fat, acid, spices, caffeine and sweets >>>If you are smoking, Please stop! >>>Decrease alcohol consumption >>>Work on maintaining a healthy weight with normal BMI    4. OSA treated with BiPAP  Resume CPAP Use   We recommend that you continue using your CPAP daily >>>Keep up the hard work using your device >>> Goal should be wearing this for the entire night that you are sleeping, at least 4 to 6 hours  Remember:  . Do not drive or operate heavy machinery if tired or drowsy.  . Please notify the supply company and office if you are unable to use your device regularly due to missing supplies or machine being broken.  . Work on maintaining a healthy weight and following your recommended nutrition plan  . Maintain proper daily exercise and movement  . Maintaining proper use of your device can also help improve management of other chronic illnesses such as: Blood pressure, blood sugars, and weight management.   BiPAP/ CPAP Cleaning:  >>>Clean weekly, with Dawn soap, and bottle brush.  Set up to air dry. >>> Wipe mask out daily with wet wipe or towelette    We recommend today:   Meds ordered this encounter  Medications  . omeprazole (PRILOSEC) 20 MG capsule    Sig: Take 1 capsule (20 mg total) by mouth daily.  Dispense:  30 capsule    Refill:  4  . azithromycin (ZITHROMAX) 250 MG tablet    Sig: 500mg  (two tablets) today, then 250mg  (1 tablet) for the next 4 days    Dispense:  6 tablet    Refill:  0  . predniSONE (DELTASONE) 10 MG tablet    Sig: 4 tabs for 2 days, then 3 tabs for 2 days, 2 tabs for 2 days, then 1 tab for 2 days, then stop    Dispense:  20 tablet    Refill:  0    Follow Up:    Return in about 2 months (around 06/27/2019), or if symptoms worsen or fail to improve, for Follow up with Dr. Halford Chessman, Follow up with Wyn Quaker FNP-C.   Please do  your part to reduce the spread of COVID-19:      Reduce your risk of any infection  and COVID19 by using the similar precautions used for avoiding the common cold or flu:  [name] Kitchen Wash your hands often with soap and warm water for at least 20 seconds.  If soap and water are not readily available, use an alcohol-based hand sanitizer with at least 60% alcohol.  . If coughing or sneezing, cover your mouth and nose by coughing or sneezing into the elbow areas of your shirt or coat, into a tissue or into your sleeve (not your hands). Langley Gauss A MASK when in public  . Avoid shaking hands with others and consider head nods or verbal greetings only. . Avoid touching your eyes, nose, or mouth with unwashed hands.  . Avoid close contact with people who are sick. . Avoid places or events with large numbers of people in one location, like concerts or sporting events. . If you have some symptoms but not all symptoms, continue to monitor at home and seek medical attention if your symptoms worsen. . If you are having a medical emergency, call 911.   [place] / e-Visit: [email or web address]         [facility]: [facility]: [id number]                   [facility]: [id number]     It is flu season:   >>> Best ways to protect herself from the flu: Receive the yearly flu vaccine, practice good hand hygiene washing with soap and also using hand sanitizer when available, eat a nutritious meals, get adequate rest, hydrate appropriately   Please contact the office if your symptoms worsen or you have concerns that you are not improving.   Thank you for choosing [facility] for your healthcare, and for allowing [place] to partner with you on your healthcare journey. I am thankful to be able to provide care to you today.   Wyn Quaker FNP-C

## 2019-04-27 NOTE — Assessment & Plan Note (Signed)
Worsened dry cough Dry cough worse at night History of GERD  Plan: Start omeprazole Follow GERD lifestyle recommendations

## 2019-04-28 ENCOUNTER — Telehealth: Payer: Self-pay | Admitting: Pulmonary Disease

## 2019-04-28 DIAGNOSIS — R0602 Shortness of breath: Secondary | ICD-10-CM

## 2019-04-28 DIAGNOSIS — R310 Gross hematuria: Secondary | ICD-10-CM | POA: Diagnosis not present

## 2019-04-28 DIAGNOSIS — R972 Elevated prostate specific antigen [PSA]: Secondary | ICD-10-CM | POA: Diagnosis not present

## 2019-04-28 NOTE — Telephone Encounter (Signed)
04/28/2019  Can we please follow-up with the patient and let him know I have spoken with Dr. Halford Chessman.  We would like to receive the records from his cardiologist evaluation in [place].  If this evaluation has been greater than 1 to 2 years ago.  It would be our recommendation that he also be evaluated by a cardiologist locally. Ok to refer. Please request old records as well.   Dr. Halford Chessman is also recommending a [facility] allergy panel just to further evaluate any known environmental allergens that could be contributing to some of the symptoms.  I have placed this lab order.   We believe we can hold off on a CT of his chest at this time.  Wyn Quaker FNP

## 2019-04-29 DIAGNOSIS — R05 Cough: Secondary | ICD-10-CM

## 2019-04-29 DIAGNOSIS — R059 Cough, unspecified: Secondary | ICD-10-CM

## 2019-04-29 DIAGNOSIS — R0602 Shortness of breath: Secondary | ICD-10-CM

## 2019-04-29 NOTE — Telephone Encounter (Signed)
04/29/2019  Please see message from 04/28/2019:  04/28/2019  Can we please follow-up with the patient and let him know I have spoken with Dr. Halford Chessman.  We would like to receive the records from his cardiologist evaluation in [place].  If this evaluation has been greater than 1 to 2 years ago.  It would be our recommendation that he also be evaluated by a cardiologist locally. Ok to refer. Please request old records as well.   Dr. Halford Chessman is also recommending a [facility] allergy panel just to further evaluate any known environmental allergens that could be contributing to some of the symptoms.  I have placed this lab order.   We believe we can hold off on a CT of his chest at this time.  Wyn Quaker FNP    Please have the patient come in to complete the lab work.  Can place an order for CT chest to further evaluate patient's ongoing symptoms.  Please further investigate when patient was last seen by cardiology team.  If it has been greater than 1 year then I would recommend referral to a [facility] cardiologist.  Wyn Quaker, FNP

## 2019-04-29 NOTE — Telephone Encounter (Signed)
Allen [name], please see pt's mychart message as he states he has been having more problems with his cough and wants to know if it is okay to move forward with the CT. I saw you stated in last visit to have Dr. Halford Chessman review pt's symptoms and then decide on timing for CT. I am also going to route this to Dr. Halford Chessman as well.

## 2019-04-30 ENCOUNTER — Other Ambulatory Visit: Payer: Self-pay

## 2019-04-30 ENCOUNTER — Encounter: Payer: Self-pay | Admitting: Neurology

## 2019-04-30 ENCOUNTER — Ambulatory Visit: Payer: Medicare HMO | Admitting: Neurology

## 2019-04-30 DIAGNOSIS — G250 Essential tremor: Secondary | ICD-10-CM | POA: Diagnosis not present

## 2019-04-30 MED ORDER — PRIMIDONE 50 MG PO TABS: 50.0000 mg | ORAL_TABLET | Freq: Every day | ORAL | 1 refills | Status: DC

## 2019-04-30 NOTE — Patient Instructions (Signed)
Start primidone 50 mg - 1/2 tablet at bedtime for 1 week and then increase to 1 tablet at bedtime thereafter.  Call me in a month if we need to increase it further

## 2019-04-30 NOTE — Telephone Encounter (Signed)
See MyChart encounter from 04/30/19.

## 2019-04-30 NOTE — Procedures (Signed)
DBS Programming was performed.    Manufacturer of DBS device: [employer]onic  Total time spent programming was 40 minutes.  Device was confirmed to be on.  Soft start was confirmed to be on.  Impedences were checked and were within normal limits.  Battery was checked and was determined to be functioning normally and not near the end of life.  Final settings were as follows:   Active Contacts Amplitude (V) PW (ms) Frequency (hz) Battery symptoms  Left Brain        Group A 2-C+ 2.1 60 150    Group B (active) 2-0+ 2.9 90 170 2.80   Others tried         2-1+ 5.0 90 170  Tremor w/heavy weight   1-2+ 4.5 90 160  Tremor w/heavy weight only                          Right Brain

## 2019-05-01 MED ORDER — TRELEGY ELLIPTA 100-62.5-25 MCG/INH IN AEPB: 1.0000 | INHALATION_SPRAY | Freq: Every day | RESPIRATORY_TRACT | 3 refills | Status: DC

## 2019-05-01 NOTE — Telephone Encounter (Signed)
I have not.   Wyn Quaker FNP

## 2019-05-01 NOTE — Telephone Encounter (Signed)
Can we not just send the prescription in again?Wyn Quaker, FNP

## 2019-05-01 NOTE — Telephone Encounter (Signed)
RX has been resent to [facility]. Pt is aware. Nothing further was needed.

## 2019-05-03 NOTE — Telephone Encounter (Signed)
[facility] encounter has not been read yet. Can we resend to pt if not read by 05/05/19?  Wyn Quaker FNP

## 2019-05-04 ENCOUNTER — Ambulatory Visit (INDEPENDENT_AMBULATORY_CARE_PROVIDER_SITE_OTHER)
Admission: RE | Admit: 2019-05-04 | Discharge: 2019-05-04 | Disposition: A | Discharge status: Home / Self Care | Payer: Medicare HMO | Source: Ambulatory Visit | Attending: Pulmonary Disease | Admitting: Pulmonary Disease

## 2019-05-04 ENCOUNTER — Other Ambulatory Visit: Payer: Self-pay

## 2019-05-04 DIAGNOSIS — R0602 Shortness of breath: Secondary | ICD-10-CM

## 2019-05-04 DIAGNOSIS — I517 Cardiomegaly: Secondary | ICD-10-CM

## 2019-05-05 ENCOUNTER — Other Ambulatory Visit: Payer: Self-pay

## 2019-05-06 ENCOUNTER — Encounter: Payer: Self-pay | Admitting: Internal Medicine

## 2019-05-06 ENCOUNTER — Ambulatory Visit (INDEPENDENT_AMBULATORY_CARE_PROVIDER_SITE_OTHER): Payer: Medicare HMO | Admitting: Internal Medicine

## 2019-05-06 VITALS — BP 130/90 | HR 76 | Temp 98.0°F | Ht 69.75 in | Wt 253.6 lb

## 2019-05-06 DIAGNOSIS — R0602 Shortness of breath: Secondary | ICD-10-CM

## 2019-05-06 DIAGNOSIS — Z0000 Encounter for general adult medical examination without abnormal findings: Secondary | ICD-10-CM | POA: Diagnosis not present

## 2019-05-06 DIAGNOSIS — K219 Gastro-esophageal reflux disease without esophagitis: Secondary | ICD-10-CM | POA: Diagnosis not present

## 2019-05-06 DIAGNOSIS — G250 Essential tremor: Secondary | ICD-10-CM

## 2019-05-06 DIAGNOSIS — J449 Chronic obstructive pulmonary disease, unspecified: Secondary | ICD-10-CM | POA: Diagnosis not present

## 2019-05-06 DIAGNOSIS — G4733 Obstructive sleep apnea (adult) (pediatric): Secondary | ICD-10-CM

## 2019-05-06 LAB — COMPREHENSIVE METABOLIC PANEL
ALT: 18 U/L (ref 0–53)
AST: 19 U/L (ref 0–37)
Albumin: 4.4 g/dL (ref 3.5–5.2)
Alkaline Phosphatase: 50 U/L (ref 39–117)
BUN: 20 mg/dL (ref 6–23)
CO2: 28 mEq/L (ref 19–32)
Calcium: 9.1 mg/dL (ref 8.4–10.5)
Chloride: 104 mEq/L (ref 96–112)
Creatinine, Ser: 0.98 mg/dL (ref 0.40–1.50)
GFR: 75.88 mL/min (ref >60.00)
Glucose, Bld: 100 mg/dL — ABNORMAL HIGH (ref 70–99)
Potassium: 4.5 mEq/L (ref 3.5–5.1)
Sodium: 138 mEq/L (ref 135–145)
Total Bilirubin: 0.5 mg/dL (ref 0.2–1.2)
Total Protein: 6.5 g/dL (ref 6.0–8.3)

## 2019-05-06 LAB — LIPID PANEL
Cholesterol: 171 mg/dL (ref 0–200)
HDL: 62.9 mg/dL (ref >39.00)
LDL Cholesterol: 86 mg/dL (ref 0–99)
NonHDL: 107.68
Total CHOL/HDL Ratio: 3
Triglycerides: 107 mg/dL (ref 0.0–149.0)
VLDL: 21.4 mg/dL (ref 0.0–40.0)

## 2019-05-06 LAB — VITAMIN B12: Vitamin B-12: 622 pg/mL (ref 211–911)

## 2019-05-06 LAB — HEMOGLOBIN A1C: Hgb A1c MFr Bld: 5.6 % (ref 4.6–6.5)

## 2019-05-06 LAB — TSH: TSH: 1.54 u[IU]/mL (ref 0.35–4.50)

## 2019-05-06 LAB — VITAMIN D 25 HYDROXY (VIT D DEFICIENCY, FRACTURES): VITD: 29.09 ng/mL — ABNORMAL LOW (ref 30.00–100.00)

## 2019-05-06 MED ORDER — TRAZODONE HCL 100 MG PO TABS: ORAL_TABLET | ORAL | 1 refills | Status: DC

## 2019-05-06 MED ORDER — MONTELUKAST SODIUM 10 MG PO TABS: 10.0000 mg | ORAL_TABLET | Freq: Every day | ORAL | 1 refills | Status: DC

## 2019-05-06 MED ORDER — TADALAFIL 20 MG PO TABS: 20.0000 mg | ORAL_TABLET | Freq: Every day | ORAL | 3 refills | Status: DC | PRN

## 2019-05-06 MED ORDER — BENZONATATE 100 MG PO CAPS: 100.0000 mg | ORAL_CAPSULE | Freq: Two times a day (BID) | ORAL | 0 refills | Status: DC | PRN

## 2019-05-06 MED ORDER — TAMSULOSIN HCL 0.4 MG PO CAPS: 0.4000 mg | ORAL_CAPSULE | Freq: Every day | ORAL | 1 refills | Status: DC

## 2019-05-06 NOTE — Progress Notes (Signed)
Established Patient Office Visit     This visit occurred during the SARS-CoV-2 public health emergency.  Safety protocols were in place, including screening questions prior to the visit, additional usage of staff PPE, and extensive cleaning of exam room while observing appropriate contact time as indicated for disinfecting solutions.    CC/Reason for Visit: Annual preventive exam and subsequent Medicare wellness visit, discuss some acute concerns  HPI: Allen [name] is a 69 y.o. male who is coming in today for the above mentioned reasons. Past Medical History is significant for:   1.  Essential tremor with a brain stimulator followed by Dr. Alda Lea.  2.  COPD/asthma on Singulair, Trelegy and as needed albuterol, followed by pulmonary.  3.  Obstructive sleep apnea on BiPAP, used to see a sleep physician who has him on trazodone and sublingual zolpidem.  4.  Right carpal tunnel syndrome has been seeing a local orthopedist and recently had several steroid injections in his hand which seems to help.  5.  Bilateral cataracts  6.  GERD causing vocal cord dysfunction.  He has been taking famotidine in the morning and omeprazole in the evening.  Since I last saw him he has had several important events: He had bilateral cataract surgery that went well, he also had surgery to his right wrist, and surgery to his right ankle as well.  He had been having issues walking after an injury and was wheelchair-bound for 5 months.  He states that this surgery has made him 100% better and his mobility is back to 100%.  He also was noted to have an elevated PSA and had a prostate biopsy recently that was negative however there were some atypical cells and he is to have a repeat biopsy in 6 months with a repeat PSA in 3 months.  He is being followed by urology.  He tells me that he had a pretty significant postprocedural UTI that required antibiotic therapy.  His main recent complaint has been coughing  and mild shortness of breath.  He saw pulmonary who started him on Singulair and switched him from [facility] to Trelegy, he has noted some improvement but cough is still significant.  This is his first year in [place], pulmonary is requesting referral to allergy.  Pulmonary also did a CT scan that showed "an enlarged heart".  He does not have chest pain only shortness of breath.  He tells me he had a cardiac catheterization in [place] 4 years ago without obstructive coronary artery disease.   Past Medical/Surgical History: Past Medical History:  Diagnosis Date  . Asthma   . Bilateral cataracts   . ED (erectile dysfunction)   . Essential tremor    with nerve stimulator  . GERD (gastroesophageal reflux disease)   . Insomnia   . OSA treated with BiPAP   . Right carpal tunnel syndrome     Past Surgical History:  Procedure Laterality Date  . APPENDECTOMY  2015  . CARPAL TUNNEL RELEASE Left 2019  . CATARACT EXTRACTION Bilateral 2020  . DEEP BRAIN STIMULATOR PLACEMENT Left 2016  . HERNIA REPAIR Right 2007  . ROTATOR CUFF REPAIR Right 2014  . TOTAL SHOULDER REPLACEMENT Left 2016  . VEIN SURGERY Bilateral 2009    Social History:  reports that he quit smoking about 30 years ago. His smoking use included cigarettes. He has a 60.00 pack-year smoking history. He has quit using smokeless tobacco. He reports current alcohol use of about 14.0 standard drinks of  alcohol per week. He reports that he does not use drugs.  Allergies: No Known Allergies  Family History:  Family History  Problem Relation Age of Onset  . COPD Mother   . Asthma Father   . Rectal cancer Sister   . Healthy Child      Current Outpatient Medications:  .  albuterol (PROVENTIL) (2.5 MG/3ML) 0.083% nebulizer solution, Take 3 mLs (2.5 mg total) by nebulization every 6 (six) hours., Disp: 150 mL, Rfl: 3 .  Fluticasone-Umeclidin-Vilant (TRELEGY ELLIPTA) 100-62.5-25 MCG/INH AEPB, Inhale 1 puff into the lungs daily.,  Disp: 120 each, Rfl: 3 .  montelukast (SINGULAIR) 10 MG tablet, Take 1 tablet (10 mg total) by mouth at bedtime., Disp: 90 tablet, Rfl: 3 .  primidone (MYSOLINE) 50 MG tablet, Take 1 tablet (50 mg total) by mouth at bedtime., Disp: 90 tablet, Rfl: 1 .  tadalafil (CIALIS) 20 MG tablet, Take 20 mg by mouth daily as needed for erectile dysfunction., Disp: , Rfl:  .  tamsulosin (FLOMAX) 0.4 MG CAPS capsule, Take 0.4 mg by mouth at bedtime., Disp: , Rfl:  .  traZODone (DESYREL) 100 MG tablet, 2 tabs at bedtime, Disp: 180 tablet, Rfl: 1  Review of Systems:  Constitutional: Denies fever, chills, diaphoresis, appetite change and fatigue.  HEENT: Denies photophobia, eye pain, redness, hearing loss, ear pain, congestion, sore throat, rhinorrhea, sneezing, mouth sores, trouble swallowing, neck pain, neck stiffness and tinnitus.   Respiratory: Denies SOB, DOE, cough, chest tightness,  and wheezing.   Cardiovascular: Denies chest pain, palpitations and leg swelling.  Gastrointestinal: Denies nausea, vomiting, abdominal pain, diarrhea, constipation, blood in stool and abdominal distention.  Genitourinary: Denies dysuria, urgency, frequency, hematuria, flank pain and difficulty urinating.  Endocrine: Denies: hot or cold intolerance, sweats, changes in hair or nails, polyuria, polydipsia. Musculoskeletal: Denies myalgias, back pain, joint swelling, arthralgias and gait problem.  Skin: Denies pallor, rash and wound.  Neurological: Denies dizziness, seizures, syncope, weakness, light-headedness, numbness and headaches.  Hematological: Denies adenopathy. Easy bruising, personal or family bleeding history  Psychiatric/Behavioral: Denies suicidal ideation, mood changes, confusion, nervousness, sleep disturbance and agitation    Physical Exam: Vitals:   05/06/19 0959  BP: 130/90  Pulse: 76  Temp: 98 F (36.7 C)  TempSrc: Temporal  SpO2: 97%  Weight: 253 lb 9.6 oz (115 kg)  Height: 5' 9.75" (1.772 m)     Body mass index is 36.65 kg/m.   Constitutional: NAD, calm, comfortable Eyes: PERRL, lids and conjunctivae normal, wears corrective lenses ENMT: Mucous membranes are moist.  Tympanic membrane is pearly white, no erythema or bulging. Neck: normal, supple, no masses, no thyromegaly Respiratory: clear to auscultation bilaterally, no wheezing, no crackles. Normal respiratory effort. No accessory muscle use.  Cardiovascular: Regular rate and rhythm, no murmurs / rubs / gallops. No extremity edema.  Abdomen: no tenderness, no masses palpated. No hepatosplenomegaly. Bowel sounds positive.  Musculoskeletal: no clubbing / cyanosis. No joint deformity upper and lower extremities. Good ROM, no contractures. Normal muscle tone.  Skin: no rashes, lesions, ulcers. No induration Neurologic: CN 2-12 grossly intact. Sensation intact, DTR normal. Strength 5/5 in all 4.  Psychiatric: Normal judgment and insight. Alert and oriented x 3. Normal mood.    Subsequent Medicare wellness visit   1. Risk factors, based on past  M,S,F -cardiovascular disease risk factors include age, gender   2.  Physical activities: He is quite physically active after his ankle repair   3.  Depression/mood:  Stable, not depressed   4.  Hearing:  No perceived issues   5.  ADL's: Independent in all ADLs   6.  Fall risk:  Low fall risk   7.  Home safety: No problems identified   8.  Height weight, and visual acuity: Height and weight as above, visual acuity is 20/20 both eyes with correction   9.  Counseling:  None today   10. Lab orders based on risk factors: Laboratory update will be reviewed   11. Referral :  Allergy   12. Care plan:  Follow-up with me in 6 months   13. Cognitive assessment:  No cognitive impairment   14. Screening: Patient provided with a written and personalized 5-10 year screening schedule in the AVS.   yes   15. Provider List Update:   PCP, urologist (Dr. Claudia Desanctis), ophthalmologist (Dr.  Katy Fitch), Podiatry, hand surgery  16. Advance Directives: Full code     Office Visit from 05/06/2019 in [place] at [facility]  PHQ-9 Total Score  6      Fall Risk  05/06/2019 04/30/2019 10/30/2018 09/18/2018  Falls in the past year? 0 0 0 0  Number falls in past yr: 0 - 0 0  Injury with Fall? 0 - 0 0     Impression and Plan:  Encounter for preventive health examination  -He has routine eye and dental care. -He has received both Covid vaccines, only vaccine that he is due for a shingles series but we will wait 6 weeks after his last Covid vaccine to administer. -Screening labs today. -Healthy lifestyle discussed in detail. -He is actively being followed by urology due to elevated PSA. -He had a colonoscopy in 2019 and is a 3-year callback due to history of polyps.  Shortness of breath -Currently being worked up by pulmonary with medication change. -I have referred him for an echocardiogram given the enlarged heart seen on CT scan, however believe coronary artery disease is unlikely with a completely clean cath per patient report 4 years ago.  He has signed a release to request records today. -I think it is likely that he has allergies to pollen which may be causing postnasal drip and coughing as this is his first spring season in [place]. -Have advised daily use of an antihistamine and allergist referral per recommendation of pulmonary. -I have prescribed Tessalon Perles for his cough.  Essential tremor -Followed by neurology, has a deep brain stimulator.  Gastroesophageal reflux disease without esophagitis  -Well-controlled, not currently on PPI therapy.  OSA treated with BiPAP -Typically uses his BiPAP but has not been able to the past few weeks due to his coughing.  Chronic obstructive pulmonary disease, unspecified COPD type (HCC) -Trelegy, Singulair, rescue inhaler. -Followed by pulmonology.  Morbid obesity ([place]) -Discussed healthy lifestyle, including  increased physical activity and better food choices to promote weight loss.     Lelon Frohlich, MD [facility]

## 2019-05-07 ENCOUNTER — Other Ambulatory Visit: Payer: Self-pay | Admitting: Internal Medicine

## 2019-05-07 ENCOUNTER — Encounter: Payer: Self-pay | Admitting: Internal Medicine

## 2019-05-07 ENCOUNTER — Encounter (HOSPITAL_COMMUNITY): Payer: Self-pay | Admitting: *Deleted

## 2019-05-07 DIAGNOSIS — H43392 Other vitreous opacities, left eye: Secondary | ICD-10-CM | POA: Diagnosis not present

## 2019-05-07 DIAGNOSIS — H0288B Meibomian gland dysfunction left eye, upper and lower eyelids: Secondary | ICD-10-CM | POA: Diagnosis not present

## 2019-05-07 DIAGNOSIS — Z961 Presence of intraocular lens: Secondary | ICD-10-CM | POA: Diagnosis not present

## 2019-05-07 DIAGNOSIS — H26492 Other secondary cataract, left eye: Secondary | ICD-10-CM | POA: Diagnosis not present

## 2019-05-07 DIAGNOSIS — H0288A Meibomian gland dysfunction right eye, upper and lower eyelids: Secondary | ICD-10-CM | POA: Diagnosis not present

## 2019-05-07 DIAGNOSIS — E559 Vitamin D deficiency, unspecified: Secondary | ICD-10-CM | POA: Insufficient documentation

## 2019-05-07 DIAGNOSIS — J449 Chronic obstructive pulmonary disease, unspecified: Secondary | ICD-10-CM

## 2019-05-07 DIAGNOSIS — H04123 Dry eye syndrome of bilateral lacrimal glands: Secondary | ICD-10-CM | POA: Diagnosis not present

## 2019-05-07 DIAGNOSIS — H353131 Nonexudative age-related macular degeneration, bilateral, early dry stage: Secondary | ICD-10-CM | POA: Diagnosis not present

## 2019-05-07 LAB — CBC WITH DIFFERENTIAL/PLATELET
Basophils Absolute: 0.1 10*3/uL (ref 0.0–0.1)
Basophils Relative: 0.9 % (ref 0.0–3.0)
Eosinophils Absolute: 0.2 10*3/uL (ref 0.0–0.7)
Eosinophils Relative: 2.8 % (ref 0.0–5.0)
HCT: 39.9 % (ref 39.0–52.0)
Hemoglobin: 13.2 g/dL (ref 13.0–17.0)
Lymphocytes Relative: 19.9 % (ref 12.0–46.0)
Lymphs Abs: 1.3 10*3/uL (ref 0.7–4.0)
MCHC: 33 g/dL (ref 30.0–36.0)
MCV: 95 fl (ref 78.0–100.0)
Monocytes Absolute: 0.5 10*3/uL (ref 0.1–1.0)
Monocytes Relative: 7 % (ref 3.0–12.0)
Neutro Abs: 4.5 10*3/uL (ref 1.4–7.7)
Neutrophils Relative %: 69.4 % (ref 43.0–77.0)
Platelets: 304 10*3/uL (ref 150.0–400.0)
RBC: 4.2 Mil/uL — ABNORMAL LOW (ref 4.22–5.81)
RDW: 15.1 % (ref 11.5–15.5)
WBC: 6.5 10*3/uL (ref 4.0–10.5)

## 2019-05-07 MED ORDER — VITAMIN D (ERGOCALCIFEROL) 1.25 MG (50000 UNIT) PO CAPS: 50000.0000 [IU] | ORAL_CAPSULE | ORAL | 0 refills | Status: DC

## 2019-05-07 NOTE — Telephone Encounter (Signed)
Sure. [facility] rehab referral under Dr. Halford Chessman - Emphysema / Resp services.   Wyn Quaker FNP

## 2019-05-07 NOTE — Telephone Encounter (Signed)
Received the following message from patient:   "Dear Allen [name], I saw my primary yesterday, and she is referring me for an echo before I go to Cardiology. She is also sending me to Allergy. I discussed Pulmonary Rehab with her and she felt that it would be a good referral for me. I am motivated to get started. Could you facilitate the referral for me?"  Allen [name], please advise. Thanks!

## 2019-05-07 NOTE — Progress Notes (Signed)
Received referral from Dr. Halford Chessman for this pt to participate in pulmonary rehab with the the diagnosis of Emphysema. Clinical review of pt follow up appt on 4/12 with Wyn Quaker NP with Dr. Halford Chessman Pulmonary and 4/21 with his PCP and 4/15 with neurology office note.  Pt with Covid Risk Score - 4. Pt appropriate for scheduling for Pulmonary rehab.  Will forward to support staff for verification of insurance eligibility/benefits and pulmonary rehab staff for scheduling with pt consent. Cherre Huger, BSN Cardiac and Training and development officer

## 2019-05-08 ENCOUNTER — Telehealth (HOSPITAL_COMMUNITY): Payer: Self-pay

## 2019-05-08 NOTE — Telephone Encounter (Signed)
Pt insurance is active and benefits verified through [employer] $30, DED 0/0 met, out of pocket $5,000/$659.78 met, co-insurance 0%. no pre-authorization required. [place]/[employer] 05/08/2019'@8'$ :40am, REF# [id number]

## 2019-05-11 ENCOUNTER — Encounter: Payer: Self-pay | Admitting: Internal Medicine

## 2019-05-11 DIAGNOSIS — G4733 Obstructive sleep apnea (adult) (pediatric): Secondary | ICD-10-CM | POA: Diagnosis not present

## 2019-05-13 ENCOUNTER — Telehealth (HOSPITAL_COMMUNITY): Payer: Self-pay | Admitting: *Deleted

## 2019-05-13 ENCOUNTER — Other Ambulatory Visit: Payer: Self-pay | Admitting: Internal Medicine

## 2019-05-13 DIAGNOSIS — E559 Vitamin D deficiency, unspecified: Secondary | ICD-10-CM

## 2019-05-25 ENCOUNTER — Telehealth (HOSPITAL_COMMUNITY): Payer: Self-pay | Admitting: Internal Medicine

## 2019-05-26 ENCOUNTER — Other Ambulatory Visit: Payer: Self-pay

## 2019-05-26 ENCOUNTER — Ambulatory Visit: Payer: Medicare HMO | Admitting: Cardiology

## 2019-05-26 ENCOUNTER — Encounter: Payer: Self-pay | Admitting: Cardiology

## 2019-05-26 VITALS — BP 126/76 | HR 78 | Ht 70.0 in | Wt 257.2 lb

## 2019-05-26 DIAGNOSIS — R0602 Shortness of breath: Secondary | ICD-10-CM | POA: Diagnosis not present

## 2019-05-26 DIAGNOSIS — R600 Localized edema: Secondary | ICD-10-CM | POA: Diagnosis not present

## 2019-05-26 DIAGNOSIS — I517 Cardiomegaly: Secondary | ICD-10-CM

## 2019-05-26 DIAGNOSIS — Z7189 Other specified counseling: Secondary | ICD-10-CM | POA: Diagnosis not present

## 2019-05-26 DIAGNOSIS — G4733 Obstructive sleep apnea (adult) (pediatric): Secondary | ICD-10-CM | POA: Diagnosis not present

## 2019-05-26 NOTE — Progress Notes (Signed)
Cardiology Office Note:    Date:  05/26/2019   ID:  Allen [name], DOB 06-19-1950, MRN [id number]  PCP:  Isaac Bliss, Rayford Halsted, MD  Cardiologist:  Buford Dresser, MD  Referring MD: [name], NP   CC: new patient evaluation for shortness of breath and enlarged heart on CT scan  History of Present Illness:    Allen [name] is a 69 y.o. male with a hx of shortness of breath, LE edema who is seen as a new consult at the request of [name], NP for the evaluation and management of shortness of breath, enlarged heart on CT scan.  Note from Dr. Isaac Bliss from 05/06/19 reviewed. Noted to have shortness of breath, has been followed by pulmonology with discussion of referral to allergy. Recent CT scan noted enlarged heart. Echo is ordered but has not yet been completed.  Cardiovascular risk factors: Prior clinical ASCVD: none Comorbid conditions: Denies hypertension, hyperlipidemia, diabetes, chronic kidney disease Metabolic syndrome/Obesity: BMI 36. Highest adult weight just above his current weight. Before the pandemic was 205 lbs. Slow weight gain. Chronic inflammatory conditions: none Tobacco use history: former, quit >30 years ago Family history: father died of myocarditis at age 38, heart was good prior. No other heart issues. Many with lung issues. Mother died of COPD. Father and grandfather had bad asthma.  Prior cardiac testing and/or incidental findings on other testing: had cath in [place] several years ago, told his vessels were clear. Cath done due to shortness of breath and leg swelling. Noted enlarged heart on recent CT. Exercise level: walks a lot, several miles at a time Current diet: trying to eat healthy, eats a lot of salad. Eggs for breakfast.  Recently drove to [place], bilateral calf and ankle swelling. Has happened in the past as well. Slowly starting to improve, but still somewhat swollen.  Has COPD and a chronic cough. Has OSA, uses  CPAP.  Has chronic bilateral LE edema. Has had three prior vein surgeries, the earliest of which was in his 54s.  Denies chest pain, PND, orthopnea, or unexpected weight gain. No syncope or palpitations.  Past Medical History:  Diagnosis Date  . Asthma   . Bilateral cataracts   . ED (erectile dysfunction)   . Essential tremor    with nerve stimulator  . GERD (gastroesophageal reflux disease)   . Insomnia   . OSA treated with BiPAP   . Right carpal tunnel syndrome     Past Surgical History:  Procedure Laterality Date  . APPENDECTOMY  2015  . CARPAL TUNNEL RELEASE Left 2019  . CATARACT EXTRACTION Bilateral 2020  . DEEP BRAIN STIMULATOR PLACEMENT Left 2016  . HERNIA REPAIR Right 2007  . ROTATOR CUFF REPAIR Right 2014  . TOTAL SHOULDER REPLACEMENT Left 2016  . VEIN SURGERY Bilateral 2009    Current Medications: Current Outpatient Medications on File Prior to Visit  Medication Sig  . albuterol (PROVENTIL) (2.5 MG/3ML) 0.083% nebulizer solution Take 3 mLs (2.5 mg total) by nebulization every 6 (six) hours.  . benzonatate (TESSALON) 100 MG capsule Take 1 capsule (100 mg total) by mouth 2 (two) times daily as needed for cough.  . Fluticasone-Umeclidin-Vilant (TRELEGY ELLIPTA) 100-62.5-25 MCG/INH AEPB Inhale 1 puff into the lungs daily.  . montelukast (SINGULAIR) 10 MG tablet Take 1 tablet (10 mg total) by mouth at bedtime.  . primidone (MYSOLINE) 50 MG tablet Take 1 tablet (50 mg total) by mouth at bedtime.  . tadalafil (CIALIS) 20 MG  tablet Take 1 tablet (20 mg total) by mouth daily as needed for erectile dysfunction.  . tamsulosin (FLOMAX) 0.4 MG CAPS capsule Take 1 capsule (0.4 mg total) by mouth at bedtime.  . traZODone (DESYREL) 100 MG tablet 2 tabs at bedtime  . Vitamin D, Ergocalciferol, (DRISDOL) 1.25 MG (50000 UNIT) CAPS capsule Take 1 capsule (50,000 Units total) by mouth every 7 (seven) days for 12 doses.   No current facility-administered medications on file prior to  visit.     Allergies:   Patient has no known allergies.   Social History   Tobacco Use  . Smoking status: Former Smoker    Packs/day: 3.00    Years: 20.00    Pack years: 60.00    Types: Cigarettes    Quit date: 01/15/1989    Years since quitting: 30.3  . Smokeless tobacco: Former Network engineer Use Topics  . Alcohol use: Yes    Alcohol/week: 14.0 standard drinks    Types: 14 Glasses of wine per week    Comment: 1 glass wine per day  . Drug use: Never    Family History: family history includes Asthma in his father; COPD in his mother; Healthy in his child; Rectal cancer in his sister.  ROS:   Please see the history of present illness.  Additional pertinent ROS: Constitutional: Negative for chills, fever, night sweats, unintentional weight loss  HENT: Negative for ear pain and hearing loss.   Eyes: Negative for loss of vision and eye pain.  Respiratory: Positive for chronic cough, denies sputum, occasional wheezing.   Cardiovascular: See HPI. Gastrointestinal: Negative for abdominal pain, melena, and hematochezia. Has had colitis in the past, under control currently. Genitourinary: Negative for dysuria and hematuria chronically, but did recently have prostate biopsy and hematuria after. Musculoskeletal: Negative for falls and myalgias.  Skin: Negative for itching and rash.  Neurological: Negative for focal weakness, focal sensory changes and loss of consciousness.  Endo/Heme/Allergies: Does not bruise/bleed easily.     EKGs/Labs/Other Studies Reviewed:    The following studies were reviewed today: CT chest 05/04/19 Cardiovascular: Heart is enlarged.  No pericardial effusion  EKG:  EKG is personally reviewed.  The ekg ordered today demonstrates NSR at 72 bpm with poor R wave progression  Recent Labs: 04/10/2019: Pro B Natriuretic peptide (BNP) 33.0 05/06/2019: ALT 18; BUN 20; Creatinine, Ser 0.98; Hemoglobin 13.2; Platelets 304.0; Potassium 4.5; Sodium 138; TSH 1.54    Recent Lipid Panel    Component Value Date/Time   CHOL 171 05/06/2019 1047   TRIG 107.0 05/06/2019 1047   HDL 62.90 05/06/2019 1047   CHOLHDL 3 05/06/2019 1047   VLDL 21.4 05/06/2019 1047   LDLCALC 86 05/06/2019 1047    Physical Exam:    VS:  BP 126/76   Pulse 78   Ht 5\' 10"  (1.778 m)   Wt 257 lb 3.2 oz (116.7 kg)   SpO2 98%   BMI 36.90 kg/m     Wt Readings from Last 3 Encounters:  05/26/19 257 lb 3.2 oz (116.7 kg)  05/06/19 253 lb 9.6 oz (115 kg)  04/30/19 246 lb (111.6 kg)    GEN: Well nourished, well developed in no acute distress HEENT: Normal, moist mucous membranes NECK: No JVD CARDIAC: regular rhythm, normal S1 and S2, no rubs or gallops. No murmurs. VASCULAR: Radial and DP pulses 2+ bilaterally. No carotid bruits RESPIRATORY:  Clear to auscultation without rales, wheezing or rhonchi  ABDOMEN: Soft, non-tender, non-distended MUSCULOSKELETAL:  Ambulates independently SKIN:  Warm and dry, bilateral trace LE edema NEUROLOGIC:  Alert and oriented x 3. No focal neuro deficits noted. PSYCHIATRIC:  Normal affect    ASSESSMENT:    1. Shortness of breath   2. Lower extremity edema   3. Enlarged heart   4. Cardiac risk counseling   5. Counseling on health promotion and disease prevention    PLAN:    Shortness of breath Lower extremity edema Enlarged heart size on CT -already has echo scheduled for later this week, will follow these results -if echo with normal EF and without significant diastolic dysfunction, would consider other multifactorial causes (OSA, COPD, venous insufficiency) -if echo abnormal, with ASCVD risk, would pursue ischemic evaluation  Cardiac risk counseling and prevention recommendations: -recommend heart healthy/Mediterranean diet, with whole grains, fruits, vegetable, fish, lean meats, nuts, and olive oil. Limit salt. -recommend moderate walking, 3-5 times/week for 30-50 minutes each session. Aim for at least 150 minutes.week. Goal should  be pace of 3 miles/hours, or walking 1.5 miles in 30 minutes -recommend avoidance of tobacco products. Avoid excess alcohol. -ASCVD risk score: The 10-year ASCVD risk score Mikey Bussing DC Brooke Bonito., et al., 2013) is: 12.6%   Values used to calculate the score:     Age: 63 years     Sex: Male     Is Non-Hispanic African American: No     Diabetic: No     Tobacco smoker: No     Systolic Blood Pressure: 123XX123 mmHg     Is BP treated: No     HDL Cholesterol: 62.9 mg/dL     Total Cholesterol: 171 mg/dL    Plan for follow up: If testing normal, follow up in 1 year or sooner as needed  Buford Dresser, MD, PhD [place]  [facility]    Medication Adjustments/Labs and Tests Ordered: Current medicines are reviewed at length with the patient today.  Concerns regarding medicines are outlined above.  Orders Placed This Encounter  Procedures  . EKG 12-Lead   No orders of the defined types were placed in this encounter.   Patient Instructions  Medication Instructions:  Your Physician recommend you continue on your current medication as directed.    *If you need a refill on your cardiac medications before your next appointment, please call your pharmacy*   Lab Work: None   Testing/Procedures: None   Follow-Up: At [facility], you and your health needs are our priority.  As part of our continuing mission to provide you with exceptional heart care, we have created designated Provider Care Teams.  These Care Teams include your primary Cardiologist (physician) and Advanced Practice Providers (APPs -  Physician Assistants and Nurse Practitioners) who all work together to provide you with the care you need, when you need it.  We recommend signing up for the patient portal called "MyChart".  Sign up information is provided on this After Visit Summary.  MyChart is used to connect with patients for Virtual Visits (Telemedicine).  Patients are able to view lab/test results, encounter notes,  upcoming appointments, etc.  Non-urgent messages can be sent to your provider as well.   To learn more about what you can do with MyChart, go to [email or web address].    Your next appointment:   1 year(s)  The format for your next appointment:   In Person  Provider:   Buford Dresser, MD      Signed, Buford Dresser, MD PhD 05/26/2019    [place]

## 2019-05-26 NOTE — Patient Instructions (Signed)

## 2019-05-27 ENCOUNTER — Telehealth (HOSPITAL_COMMUNITY): Payer: Self-pay | Admitting: *Deleted

## 2019-05-28 ENCOUNTER — Other Ambulatory Visit (HOSPITAL_COMMUNITY): Payer: Medicare HMO

## 2019-05-29 ENCOUNTER — Ambulatory Visit (HOSPITAL_COMMUNITY): Payer: Medicare HMO | Attending: Cardiovascular Disease

## 2019-05-29 ENCOUNTER — Other Ambulatory Visit: Payer: Self-pay

## 2019-05-29 DIAGNOSIS — R0602 Shortness of breath: Secondary | ICD-10-CM | POA: Diagnosis not present

## 2019-05-29 DIAGNOSIS — K219 Gastro-esophageal reflux disease without esophagitis: Secondary | ICD-10-CM | POA: Insufficient documentation

## 2019-05-29 DIAGNOSIS — J449 Chronic obstructive pulmonary disease, unspecified: Secondary | ICD-10-CM

## 2019-05-29 DIAGNOSIS — G4733 Obstructive sleep apnea (adult) (pediatric): Secondary | ICD-10-CM | POA: Diagnosis not present

## 2019-06-01 ENCOUNTER — Encounter: Payer: Self-pay | Admitting: Allergy and Immunology

## 2019-06-01 ENCOUNTER — Other Ambulatory Visit: Payer: Self-pay

## 2019-06-01 ENCOUNTER — Ambulatory Visit: Payer: Medicare HMO | Admitting: Allergy and Immunology

## 2019-06-01 VITALS — BP 132/80 | HR 74 | Temp 98.3°F | Resp 18 | Ht 69.5 in | Wt 256.0 lb

## 2019-06-01 DIAGNOSIS — R053 Chronic cough: Secondary | ICD-10-CM

## 2019-06-01 DIAGNOSIS — J3089 Other allergic rhinitis: Secondary | ICD-10-CM | POA: Diagnosis not present

## 2019-06-01 DIAGNOSIS — R05 Cough: Secondary | ICD-10-CM | POA: Diagnosis not present

## 2019-06-01 DIAGNOSIS — K219 Gastro-esophageal reflux disease without esophagitis: Secondary | ICD-10-CM

## 2019-06-01 DIAGNOSIS — J449 Chronic obstructive pulmonary disease, unspecified: Secondary | ICD-10-CM

## 2019-06-01 MED ORDER — AZELASTINE-FLUTICASONE 137-50 MCG/ACT NA SUSP: 1.0000 | Freq: Two times a day (BID) | NASAL | 5 refills | Status: DC | PRN

## 2019-06-01 MED ORDER — OMEPRAZOLE 20 MG PO CPDR
20.0000 mg | DELAYED_RELEASE_CAPSULE | Freq: Every day | ORAL | 5 refills | Status: DC
Start: 2019-06-01 — End: 2019-11-26

## 2019-06-01 MED ORDER — BREZTRI AEROSPHERE 160-9-4.8 MCG/ACT IN AERO: 2.0000 | INHALATION_SPRAY | Freq: Two times a day (BID) | RESPIRATORY_TRACT | 5 refills | Status: DC

## 2019-06-01 NOTE — Assessment & Plan Note (Signed)
   Appropriate reflux lifestyle modifications have been provided.  A prescription has been provided for omeprazole 20 mg daily, 30 minutes prior to breakfast.

## 2019-06-01 NOTE — Progress Notes (Signed)
New Patient Note  RE: Allen [name] MRN: [id number] DOB: [birth year] Date of Office Visit: 06/01/2019  Referring provider: Isaac Bliss, [place]* Primary care provider: Isaac Bliss, Rayford Halsted, MD  Chief Complaint: Shortness of Breath and Cough  History of present illness: Allen [name] is a 69 y.o. male seen today in consultation requested by Leotis Shames, MD.  He complains of a persistent cough which started in early March of this year.  That cough is accompanied by some shortness of breath and chest tightness.  The cough is described as nonproductive/dry, and seems to originate in the upper chest and base of the throat.  The cough is worse at nighttime and worse with exertion.  He reports that approximately 3 years ago he was diagnosed with GERD while being evaluated for recurrent hoarseness.  He was evaluated with laryngoscopy and EGD.  He was started on a proton pump inhibitor with benefit.  However, he has not taken proton pump inhibitor over the past couple years. He experiences nasal congestion, rhinorrhea, and watery eyes, and needing to blow his nose "constantly" during the morning time.  No significant seasonal symptom variation has been noted nor have specific environmental triggers been identified.  Assessment and plan: COPD (chronic obstructive pulmonary disease) (HCC) Given the predominance of the cough, we will switch from a dry powder inhaler to [facility].  A sample and prescription have been provided for BrezTri 160 mcg, 2 inhalations twice daily.  To maximize pulmonary deposition, a spacer has been provided along with instructions for its proper administration with an HFA inhaler.  Continue montelukast 10 mg daily at bedtime and albuterol HFA, 1 to 2 inhalations every 4-6 hours if needed.  Subjective and objective measures of pulmonary function will be followed and the treatment plan will be adjusted accordingly.  Perennial allergic rhinitis probable  nonallergic component  Aeroallergen avoidance measures have been discussed and provided in written form.  A prescription has been provided for azelastine/fluticasone nasal spray, 1 spray per nostril twice daily as needed. Proper nasal spray technique has been discussed and demonstrated.  Nasal saline lavage (NeilMed) has been recommended as needed and prior to medicated nasal sprays along with instructions for proper administration.  For thick post nasal drainage, add guaifenesin 1200 mg (Mucinex Maximum Strength)  twice daily as needed with adequate hydration as discussed.  GERD (gastroesophageal reflux disease)  Appropriate reflux lifestyle modifications have been provided.  A prescription has been provided for omeprazole 20 mg daily, 30 minutes prior to breakfast.  Cough, persistent The most common causes of chronic cough include the following: upper airway cough syndrome (UACS) from thick postnasal drainage which is caused by variety of rhinosinus conditions; asthma; and/or gastroesophageal reflux disease (GERD) which may present with or without heartburn. We will address these issues at this time.   Prednisone has been provided, 40 mg x3 days, 20 mg x1 day, 10 mg x1 day, then stop.  Treatment plan as outlined above.     Meds ordered this encounter  Medications  . Budeson-Glycopyrrol-Formoterol (BREZTRI AEROSPHERE) 160-9-4.8 MCG/ACT AERO    Sig: Inhale 2 puffs into the lungs in the morning and at bedtime.    Dispense:  10.7 g    Refill:  5  . Azelastine-Fluticasone 137-50 MCG/ACT SUSP    Sig: Place 1 spray into both nostrils 2 (two) times daily as needed.    Dispense:  23 g    Refill:  5  . omeprazole (PRILOSEC) 20 MG capsule  Sig: Take 1 capsule (20 mg total) by mouth daily.    Dispense:  30 capsule    Refill:  5    Diagnostics: Spirometry: FVC was 2.28 L (51% predicted) and FEV1 was 1.92 L (57% predicted) without postbronchodilator improvement.  Moderate restriction  without reversibility.  This study was performed while the patient was asymptomatic.  Please see scanned spirometry results for details. Epicutaneous testing: Negative despite a positive histamine control. Intradermal testing: Positive to molds and dog epithelia.   Physical examination: Blood pressure 132/80, pulse 74, temperature 98.3 F (36.8 C), temperature source Temporal, resp. rate 18, height 5' 9.5" (1.765 m), weight 256 lb (116.1 kg), SpO2 96 %.  General: Alert, interactive, in no acute distress. HEENT: TMs pearly gray, turbinates moderately edematous with thick discharge, post-pharynx erythematous. Neck: Supple without lymphadenopathy. Lungs: Mildly decreased breath sounds bilaterally without wheezing, rhonchi or rales. CV: Normal S1, S2 without murmurs. Abdomen: Nondistended, nontender. Skin: Warm and dry, without lesions or rashes. Extremities:  No clubbing, cyanosis or edema. Neuro:   Grossly intact.  Review of systems:  Review of systems negative except as noted in HPI / PMHx or noted below: Review of Systems  Constitutional: Negative.   HENT: Negative.   Eyes: Negative.   Respiratory: Negative.   Cardiovascular: Negative.   Gastrointestinal: Negative.   Genitourinary: Negative.   Musculoskeletal: Negative.   Skin: Negative.   Neurological: Negative.   Endo/Heme/Allergies: Negative.   Psychiatric/Behavioral: Negative.     Past medical history:  Past Medical History:  Diagnosis Date  . Asthma   . Bilateral cataracts   . COPD (chronic obstructive pulmonary disease) ([place])   . ED (erectile dysfunction)   . Essential tremor    with nerve stimulator  . GERD (gastroesophageal reflux disease)   . Insomnia   . OSA treated with BiPAP   . Recurrent upper respiratory infection (URI)   . Right carpal tunnel syndrome     Past surgical history:  Past Surgical History:  Procedure Laterality Date  . ADENOIDECTOMY    . APPENDECTOMY  2015  . CARPAL TUNNEL RELEASE  Left 2019  . CATARACT EXTRACTION Bilateral 2020  . DEEP BRAIN STIMULATOR PLACEMENT Left 2016  . HERNIA REPAIR Right 2007  . ROTATOR CUFF REPAIR Right 2014  . TONSILLECTOMY    . TOTAL SHOULDER REPLACEMENT Left 2016  . VEIN SURGERY Bilateral 2009    Family history: Family History  Problem Relation Age of Onset  . COPD Mother   . Asthma Father   . Rectal cancer Sister   . Healthy Child   . Urticaria Neg Hx   . Immunodeficiency Neg Hx   . Eczema Neg Hx   . Atopy Neg Hx   . Angioedema Neg Hx   . Allergic rhinitis Neg Hx     Social history: Social History   Socioeconomic History  . Marital status: Married    Spouse name: Not on file  . Number of children: 3  . Years of education: Not on file  . Highest education level: Associate degree: occupational, Hotel manager, or vocational program  Occupational History  . Not on file  Tobacco Use  . Smoking status: Former Smoker    Packs/day: 3.00    Years: 20.00    Pack years: 60.00    Types: Cigarettes    Quit date: 01/15/1989    Years since quitting: 30.3  . Smokeless tobacco: Former Network engineer and Sexual Activity  . Alcohol use: Yes    Alcohol/week:  14.0 standard drinks    Types: 14 Glasses of wine per week    Comment: 1 glass wine per day  . Drug use: Never  . Sexual activity: Yes  Other Topics Concern  . Not on file  Social History Narrative  . Not on file   Social Determinants of Health   Financial Resource Strain:   . Difficulty of Paying Living Expenses:   Food Insecurity:   . Worried About Charity fundraiser in the Last Year:   . Arboriculturist in the Last Year:   Transportation Needs:   . Film/video editor (Medical):   [name] Kitchen Lack of Transportation (Non-Medical):   Physical Activity:   . Days of Exercise per Week:   . Minutes of Exercise per Session:   Stress:   . Feeling of Stress :   Social Connections:   . Frequency of Communication with Friends and Family:   . Frequency of Social Gatherings  with Friends and Family:   . Attends Religious Services:   . Active Member of Clubs or Organizations:   . Attends Archivist Meetings:   [name] Kitchen Marital Status:   Intimate Partner Violence:   . Fear of Current or Ex-Partner:   . Emotionally Abused:   [name] Kitchen Physically Abused:   . Sexually Abused:     Environmental History: The patient lives in a 61-1/2-year-old apartment with concrete floors throughout and central air/heat.  There is no known mold/water damage in the home.  There is a goldendoodle in the house which has access to his bedroom.  He is currently a non-smoker but has a 60 pack yr history.   Current Outpatient Medications  Medication Sig Dispense Refill  . albuterol (PROVENTIL) (2.5 MG/3ML) 0.083% nebulizer solution Take 3 mLs (2.5 mg total) by nebulization every 6 (six) hours. 150 mL 3  . benzonatate (TESSALON) 100 MG capsule Take 1 capsule (100 mg total) by mouth 2 (two) times daily as needed for cough. 30 capsule 0  . Fluticasone-Umeclidin-Vilant (TRELEGY ELLIPTA) 100-62.5-25 MCG/INH AEPB Inhale 1 puff into the lungs daily. 120 each 3  . montelukast (SINGULAIR) 10 MG tablet Take 1 tablet (10 mg total) by mouth at bedtime. 90 tablet 1  . traZODone (DESYREL) 100 MG tablet Take 200 mg by mouth at bedtime.    . Azelastine-Fluticasone 137-50 MCG/ACT SUSP Place 1 spray into both nostrils 2 (two) times daily as needed. 23 g 5  . Budeson-Glycopyrrol-Formoterol (BREZTRI AEROSPHERE) 160-9-4.8 MCG/ACT AERO Inhale 2 puffs into the lungs in the morning and at bedtime. 10.7 g 5  . omeprazole (PRILOSEC) 20 MG capsule Take 1 capsule (20 mg total) by mouth daily. 30 capsule 5   No current facility-administered medications for this visit.    Known medication allergies: No Known Allergies  I appreciate the opportunity to take part in Pepper's care. Please do not hesitate to contact me with questions.  Sincerely,   R. Edgar Frisk, MD

## 2019-06-01 NOTE — Patient Instructions (Addendum)
COPD (chronic obstructive pulmonary disease) (HCC) Given the predominance of the cough, we will switch from a dry powder inhaler to [facility].  A sample and prescription have been provided for BrezTri 160 mcg, 2 inhalations twice daily.  To maximize pulmonary deposition, a spacer has been provided along with instructions for its proper administration with an HFA inhaler.  Continue montelukast 10 mg daily at bedtime and albuterol HFA, 1 to 2 inhalations every 4-6 hours if needed.  Subjective and objective measures of pulmonary function will be followed and the treatment plan will be adjusted accordingly.  Perennial allergic rhinitis probable nonallergic component  Aeroallergen avoidance measures have been discussed and provided in written form.  A prescription has been provided for azelastine/fluticasone nasal spray, 1 spray per nostril twice daily as needed. Proper nasal spray technique has been discussed and demonstrated.  Nasal saline lavage (NeilMed) has been recommended as needed and prior to medicated nasal sprays along with instructions for proper administration.  For thick post nasal drainage, add guaifenesin 1200 mg (Mucinex Maximum Strength)  twice daily as needed with adequate hydration as discussed.  GERD (gastroesophageal reflux disease)  Appropriate reflux lifestyle modifications have been provided.  A prescription has been provided for omeprazole 20 mg daily, 30 minutes prior to breakfast.  Cough, persistent The most common causes of chronic cough include the following: upper airway cough syndrome (UACS) from thick postnasal drainage which is caused by variety of rhinosinus conditions; asthma; and/or gastroesophageal reflux disease (GERD) which may present with or without heartburn. We will address these issues at this time.   Prednisone has been provided, 40 mg x3 days, 20 mg x1 day, 10 mg x1 day, then stop.  Treatment plan as outlined above.     Return in about 2 months  (around 08/01/2019), or if symptoms worsen or fail to improve.  Control of Dog or Cat Allergen  Avoidance is the best way to manage a dog or cat allergy. If you have a dog or cat and are allergic to dog or cats, consider removing the dog or cat from the home. If you have a dog or cat but don't want to find it a new home, or if your family wants a pet even though someone in the household is allergic, here are some strategies that may help keep symptoms at bay:  1. Keep the pet out of your bedroom and restrict it to only a few rooms. Be advised that keeping the dog or cat in only one room will not limit the allergens to that room. 2. Don't pet, hug or kiss the dog or cat; if you do, wash your hands with soap and water. 3. High-efficiency particulate air (HEPA) cleaners run continuously in a bedroom or living room can reduce allergen levels over time. 4. Place electrostatic material sheet in the air inlet vent in the bedroom. 5. Regular use of a high-efficiency vacuum cleaner or a central vacuum can reduce allergen levels. 6. Giving your dog or cat a bath at least once a week can reduce airborne allergen.  Control of Mold Allergen  Mold and fungi can grow on a variety of surfaces provided certain temperature and moisture conditions exist.  Outdoor molds grow on plants, decaying vegetation and soil.  The major outdoor mold, Alternaria and Cladosporium, are found in very high numbers during hot and dry conditions.  Generally, a late Summer - Fall peak is seen for common outdoor fungal spores.  Rain will temporarily lower outdoor mold spore count, but  counts rise rapidly when the rainy period ends.  The most important indoor molds are Aspergillus and Penicillium.  Dark, humid and poorly ventilated basements are ideal sites for mold growth.  The next most common sites of mold growth are the bathroom and the kitchen.  Outdoor [employer] 1. Use air conditioning and keep windows closed 2. Avoid exposure  to decaying vegetation. 3. Avoid leaf raking. 4. Avoid grain handling. 5. Consider wearing a face mask if working in moldy areas.  Indoor Mold Control 1. Maintain humidity below 50%. 2. Clean washable surfaces with 5% bleach solution. 3. Remove sources e.g. Contaminated carpets.  Lifestyle Changes for Controlling GERD  When you have GERD, stomach acid feels as if it's backing up toward your mouth. Whether or not you take medication to control your GERD, your symptoms can often be improved with lifestyle changes.   Raise Your Head  Reflux is more likely to strike when you're lying down flat, because stomach fluid can  flow backward more easily. Raising the head of your bed 4-6 inches can help. To do this:  Slide blocks or books under the legs at the head of your bed. Or, place a wedge under  the mattress. Many foam stores can make a suitable wedge for you. The wedge  should run from your waist to the top of your head.  Don't just prop your head on several pillows. This increases pressure on your  stomach. It can make GERD worse.  Watch Your Eating Habits Certain foods may increase the acid in your stomach or relax the lower esophageal sphincter, making GERD more likely. It's best to avoid the following:  Coffee, tea, and carbonated drinks (with and without caffeine)  Fatty, fried, or spicy food  Mint, chocolate, onions, and tomatoes  Any other foods that seem to irritate your stomach or cause you pain  Relieve the Pressure  Eat smaller meals, even if you have to eat more often.  Don't lie down right after you eat. Wait a few hours for your stomach to empty.  Avoid tight belts and tight-fitting clothes.  Lose excess weight.  Tobacco and Alcohol  Avoid smoking tobacco and drinking alcohol. They can make GERD symptoms worse.

## 2019-06-01 NOTE — Assessment & Plan Note (Signed)
   Aeroallergen avoidance measures have been discussed and provided in written form.  A prescription has been provided for azelastine/fluticasone nasal spray, 1 spray per nostril twice daily as needed. Proper nasal spray technique has been discussed and demonstrated.  Nasal saline lavage (NeilMed) has been recommended as needed and prior to medicated nasal sprays along with instructions for proper administration.  For thick post nasal drainage, add guaifenesin 1200 mg (Mucinex Maximum Strength)  twice daily as needed with adequate hydration as discussed.

## 2019-06-01 NOTE — Assessment & Plan Note (Signed)
Given the predominance of the cough, we will switch from a dry powder inhaler to [facility].  A sample and prescription have been provided for BrezTri 160 mcg, 2 inhalations twice daily.  To maximize pulmonary deposition, a spacer has been provided along with instructions for its proper administration with an HFA inhaler.  Continue montelukast 10 mg daily at bedtime and albuterol HFA, 1 to 2 inhalations every 4-6 hours if needed.  Subjective and objective measures of pulmonary function will be followed and the treatment plan will be adjusted accordingly.

## 2019-06-01 NOTE — Assessment & Plan Note (Signed)
The most common causes of chronic cough include the following: upper airway cough syndrome (UACS) from thick postnasal drainage which is caused by variety of rhinosinus conditions; asthma; and/or gastroesophageal reflux disease (GERD) which may present with or without heartburn. We will address these issues at this time.   Prednisone has been provided, 40 mg x3 days, 20 mg x1 day, 10 mg x1 day, then stop.  Treatment plan as outlined above.

## 2019-06-02 ENCOUNTER — Telehealth: Payer: Self-pay | Admitting: *Deleted

## 2019-06-02 ENCOUNTER — Encounter: Payer: Self-pay | Admitting: Internal Medicine

## 2019-06-02 DIAGNOSIS — G4733 Obstructive sleep apnea (adult) (pediatric): Secondary | ICD-10-CM

## 2019-06-02 DIAGNOSIS — I5032 Chronic diastolic (congestive) heart failure: Secondary | ICD-10-CM | POA: Insufficient documentation

## 2019-06-02 MED ORDER — AZELASTINE HCL 0.1 % NA SOLN: 1.0000 | Freq: Two times a day (BID) | NASAL | 5 refills | Status: DC | PRN

## 2019-06-02 MED ORDER — FLUTICASONE PROPIONATE 50 MCG/ACT NA SUSP
2.0000 | Freq: Every day | NASAL | 5 refills | Status: DC | PRN
Start: 2019-06-02 — End: 2019-08-27

## 2019-06-02 NOTE — Telephone Encounter (Signed)
Per Aaron Edelman - send message to Dr. Halford Chessman.  Download has been given to Dr. Halford Chessman.

## 2019-06-02 NOTE — Telephone Encounter (Signed)
Azelastine 1-2 sp per nostril bid prn Fluticasone 2 sp per nostril daily prn Thanks.

## 2019-06-02 NOTE — Addendum Note (Signed)
Addended by: Chip Boer R on: 06/02/2019 12:29 PM   Modules accepted: Orders

## 2019-06-02 NOTE — Telephone Encounter (Signed)
New prescriptions have been sent in. Called the patient and advised of change in medication. Patient verbalized understanding.

## 2019-06-02 NOTE — Telephone Encounter (Signed)
Received a fax from pharmacy stated that there needed to be a [place] for [place]. I do not see any documentation for any other failed nasal sprays. If this is the case please advise on direction for splitting the 2 medications. Thank You.

## 2019-06-02 NOTE — Telephone Encounter (Signed)
Auto Bipap 05/21/19 to 06/01/19 >> used on 11 of 12 nights with average 6 hrs 50 min.  Average AHI 9.1 with median Bipap 11/7 and 95 th percentile Bipap 12/8 cm H2O.  Please send order to change auto Bipap to min EPAP 7 cm H2O, maximal IPAP 14 cm H2O, pressure support 4 cm H2O.

## 2019-06-03 ENCOUNTER — Telehealth (HOSPITAL_COMMUNITY): Payer: Self-pay | Admitting: *Deleted

## 2019-06-05 ENCOUNTER — Telehealth (INDEPENDENT_AMBULATORY_CARE_PROVIDER_SITE_OTHER): Payer: Medicare HMO | Admitting: Internal Medicine

## 2019-06-05 DIAGNOSIS — R05 Cough: Secondary | ICD-10-CM | POA: Diagnosis not present

## 2019-06-05 DIAGNOSIS — R059 Cough, unspecified: Secondary | ICD-10-CM

## 2019-06-05 MED ORDER — HYDROCOD POLST-CPM POLST ER 10-8 MG/5ML PO SUER: 5.0000 mL | Freq: Two times a day (BID) | ORAL | 0 refills | Status: DC | PRN

## 2019-06-05 NOTE — Progress Notes (Signed)
Virtual Visit via Video Note  I connected with Allen [name] on 06/05/19 at  3:30 PM EDT by a video enabled telemedicine application and verified that I am speaking with the correct person using two identifiers.  Location patient: home Location provider: work office Persons participating in the virtual visit: patient, provider  I discussed the limitations of evaluation and management by telemedicine and the availability of in person appointments. The patient expressed understanding and agreed to proceed.   HPI: Continues to have a significant cough that is much worse at nighttime.  He saw the allergist this week who put him on 2 nasal sprays, and inhaler, Singulair, omeprazole and gave him a 4-day course of prednisone which she completes today.  He does not feel any significant improvement.  His sleep is affected because of this.  At last visit we gave him a prescription for [facility] that was not effective. He is frustrated as he feels he has been getting conflicting information from the allergist and the pulmonologist.   ROS: Constitutional: Denies fever, chills, diaphoresis, appetite change and fatigue.  HEENT: Denies photophobia, eye pain, redness, hearing loss, ear pain, congestion, sore throat, rhinorrhea, sneezing, mouth sores, trouble swallowing, neck pain, neck stiffness and tinnitus.   Respiratory: Denies SOB, DOE,  chest tightness,  and wheezing.   Cardiovascular: Denies chest pain, palpitations and leg swelling.  Gastrointestinal: Denies nausea, vomiting, abdominal pain, diarrhea, constipation, blood in stool and abdominal distention.  Genitourinary: Denies dysuria, urgency, frequency, hematuria, flank pain and difficulty urinating.  Endocrine: Denies: hot or cold intolerance, sweats, changes in hair or nails, polyuria, polydipsia. Musculoskeletal: Denies myalgias, back pain, joint swelling, arthralgias and gait problem.  Skin: Denies pallor, rash and wound.    Neurological: Denies dizziness, seizures, syncope, weakness, light-headedness, numbness and headaches.  Hematological: Denies adenopathy. Easy bruising, personal or family bleeding history  Psychiatric/Behavioral: Denies suicidal ideation, mood changes, confusion, nervousness, sleep disturbance and agitation   Past Medical History:  Diagnosis Date  . Asthma   . Bilateral cataracts   . COPD (chronic obstructive pulmonary disease) ([place])   . ED (erectile dysfunction)   . Essential tremor    with nerve stimulator  . GERD (gastroesophageal reflux disease)   . Insomnia   . OSA treated with BiPAP   . Recurrent upper respiratory infection (URI)   . Right carpal tunnel syndrome     Past Surgical History:  Procedure Laterality Date  . ADENOIDECTOMY    . APPENDECTOMY  2015  . CARPAL TUNNEL RELEASE Left 2019  . CATARACT EXTRACTION Bilateral 2020  . DEEP BRAIN STIMULATOR PLACEMENT Left 2016  . HERNIA REPAIR Right 2007  . ROTATOR CUFF REPAIR Right 2014  . TONSILLECTOMY    . TOTAL SHOULDER REPLACEMENT Left 2016  . VEIN SURGERY Bilateral 2009    Family History  Problem Relation Age of Onset  . COPD Mother   . Asthma Father   . Rectal cancer Sister   . Healthy Child   . Urticaria Neg Hx   . Immunodeficiency Neg Hx   . Eczema Neg Hx   . Atopy Neg Hx   . Angioedema Neg Hx   . Allergic rhinitis Neg Hx     SOCIAL HX:   reports that he quit smoking about 30 years ago. His smoking use included cigarettes. He has a 60.00 pack-year smoking history. He has quit using smokeless tobacco. He reports current alcohol use of about 14.0 standard drinks of alcohol per week. He reports that  he does not use drugs.   Current Outpatient Medications:  .  albuterol (PROVENTIL) (2.5 MG/3ML) 0.083% nebulizer solution, Take 3 mLs (2.5 mg total) by nebulization every 6 (six) hours., Disp: 150 mL, Rfl: 3 .  azelastine (ASTELIN) 0.1 % nasal spray, Place 1-2 sprays into both nostrils 2 (two) times daily as  needed for rhinitis., Disp: 30 mL, Rfl: 5 .  Azelastine-Fluticasone 137-50 MCG/ACT SUSP, Place 1 spray into both nostrils 2 (two) times daily as needed., Disp: 23 g, Rfl: 5 .  benzonatate (TESSALON) 100 MG capsule, Take 1 capsule (100 mg total) by mouth 2 (two) times daily as needed for cough., Disp: 30 capsule, Rfl: 0 .  Budeson-Glycopyrrol-Formoterol (BREZTRI AEROSPHERE) 160-9-4.8 MCG/ACT AERO, Inhale 2 puffs into the lungs in the morning and at bedtime., Disp: 10.7 g, Rfl: 5 .  fluticasone (FLONASE) 50 MCG/ACT nasal spray, Place 2 sprays into both nostrils daily as needed for allergies or rhinitis., Disp: 16 g, Rfl: 5 .  Fluticasone-Umeclidin-Vilant (TRELEGY ELLIPTA) 100-62.5-25 MCG/INH AEPB, Inhale 1 puff into the lungs daily., Disp: 120 each, Rfl: 3 .  montelukast (SINGULAIR) 10 MG tablet, Take 1 tablet (10 mg total) by mouth at bedtime., Disp: 90 tablet, Rfl: 1 .  omeprazole (PRILOSEC) 20 MG capsule, Take 1 capsule (20 mg total) by mouth daily., Disp: 30 capsule, Rfl: 5 .  predniSONE (DELTASONE) 1 MG tablet, Take 1 mg by mouth daily with breakfast., Disp: , Rfl:  .  traZODone (DESYREL) 100 MG tablet, Take 200 mg by mouth at bedtime., Disp: , Rfl:  .  chlorpheniramine-HYDROcodone (TUSSIONEX PENNKINETIC ER) 10-8 MG/5ML SUER, Take 5 mLs by mouth every 12 (twelve) hours as needed for cough., Disp: 140 mL, Rfl: 0  EXAM:   VITALS per patient if applicable: none reported  GENERAL: alert, oriented, appears well and in no acute distress  HEENT: atraumatic, conjunttiva clear, no obvious abnormalities on inspection of external nose and ears, wears corrective lenses.  NECK: normal movements of the head and neck  LUNGS: on inspection no signs of respiratory distress, breathing rate appears normal, no obvious gross increased work of breathing, gasping or wheezing  CV: no obvious cyanosis  MS: moves all visible extremities without noticeable abnormality  PSYCH/NEURO: pleasant and cooperative, no  obvious depression or anxiety, speech and thought processing grossly intact  ASSESSMENT AND PLAN:   Cough  -Think it is too early to see maximum effect from meds just started this week. Advised he continue to take as prescribed. -Will try tussionex cough syrup at nighttime.    I discussed the assessment and treatment plan with the patient. The patient was provided an opportunity to ask questions and all were answered. The patient agreed with the plan and demonstrated an understanding of the instructions.   The patient was advised to call back or seek an in-person evaluation if the symptoms worsen or if the condition fails to improve as anticipated.    Lelon Frohlich, MD   Primary Care at [facility]

## 2019-06-08 DIAGNOSIS — R69 Illness, unspecified: Secondary | ICD-10-CM | POA: Diagnosis not present

## 2019-06-17 ENCOUNTER — Encounter: Payer: Self-pay | Admitting: Internal Medicine

## 2019-06-17 DIAGNOSIS — M542 Cervicalgia: Secondary | ICD-10-CM

## 2019-06-17 DIAGNOSIS — M549 Dorsalgia, unspecified: Secondary | ICD-10-CM

## 2019-06-19 ENCOUNTER — Encounter: Payer: Self-pay | Admitting: Internal Medicine

## 2019-06-19 DIAGNOSIS — M79641 Pain in right hand: Secondary | ICD-10-CM | POA: Diagnosis not present

## 2019-06-23 ENCOUNTER — Encounter: Payer: Self-pay | Admitting: Family Medicine

## 2019-06-23 ENCOUNTER — Other Ambulatory Visit: Payer: Self-pay

## 2019-06-23 ENCOUNTER — Telehealth: Payer: Self-pay | Admitting: Allergy and Immunology

## 2019-06-23 ENCOUNTER — Ambulatory Visit (INDEPENDENT_AMBULATORY_CARE_PROVIDER_SITE_OTHER): Payer: Medicare HMO | Admitting: Family Medicine

## 2019-06-23 VITALS — BP 130/60 | HR 82 | Temp 98.4°F | Wt 254.4 lb

## 2019-06-23 DIAGNOSIS — I8312 Varicose veins of left lower extremity with inflammation: Secondary | ICD-10-CM

## 2019-06-23 DIAGNOSIS — I8311 Varicose veins of right lower extremity with inflammation: Secondary | ICD-10-CM | POA: Diagnosis not present

## 2019-06-23 DIAGNOSIS — I8393 Asymptomatic varicose veins of bilateral lower extremities: Secondary | ICD-10-CM | POA: Insufficient documentation

## 2019-06-23 MED ORDER — FUROSEMIDE 20 MG PO TABS
20.0000 mg | ORAL_TABLET | Freq: Every day | ORAL | 0 refills | Status: DC | PRN
Start: 2019-06-23 — End: 2019-07-16

## 2019-06-23 NOTE — Progress Notes (Signed)
° °  Subjective:    Patient ID: Allen [name], male    DOB: 07-14-1950, 69 y.o.   MRN: [id number]  HPI Here for 3 months of  Intermittent swelling in both feet, the right being much worse than the left. He has a long hx of varicose veins, and in 2009 he had bilateral vein surgery while he was living in [place]. No SOB. He had labs in April showing normal renal function. He had a recent ECHO showing normal systolic function. The right foot sometimes swells to the point it is painful. It never gets red or warm.    Review of Systems  Constitutional: Negative.   Respiratory: Negative.   Cardiovascular: Positive for leg swelling. Negative for chest pain and palpitations.       Objective:   Physical Exam Constitutional:      Appearance: Normal appearance. He is not ill-appearing.  Cardiovascular:     Rate and Rhythm: Normal rate and regular rhythm.     Pulses: Normal pulses.     Heart sounds: Normal heart sounds.  Pulmonary:     Effort: Pulmonary effort is normal.     Breath sounds: Normal breath sounds.  Musculoskeletal:     Comments: Both legs have varicose veins, the right worse than the left. The right forefoot and ankle are mildly swollen and tender. No erythema or warmth. The right calf is slightly tender.   Neurological:     Mental Status: He is alert.           Assessment & Plan:  He has varicose veins and some venous insufficiency. He will try Lasix 20 mg daily as needed. He recently purchased some knee high compression stockings, and I encouraged him to wear these every day. Recheck as needed.  Alysia Penna, MD

## 2019-06-23 NOTE — Telephone Encounter (Signed)
Patient called and states that insurance does not cover Breztri and needs an alternative. Patient states he needs a replacement for his COPD and Asthma. He is about to run out of the sample that was given to him and needs to make sure he has an alternative before he runs out.   Please advise.

## 2019-06-23 NOTE — Telephone Encounter (Signed)
This is a Dr. [name] patient.  I will defer to him.  Allen Marvel, MD [facility] [employer] [facility] [place]

## 2019-06-23 NOTE — Telephone Encounter (Signed)
Spoke with the pt and informed him of the message below.  Appt scheduled for today with Dr Sarajane Jews at 3:30pm due to no openings available for PCP.

## 2019-06-23 NOTE — Telephone Encounter (Signed)
Patient called and states that insurance does not cover Breztri and needs an alternative. Patient states he needs a replacement for his COPD and Asthma. He is about to run out of the sample that was given to him and needs to make sure he has an alternative before he runs out.  Please advise.

## 2019-06-23 NOTE — Telephone Encounter (Signed)
Please send in a prescription for Symbicort (budesonide/formoterol) 160/4.5 g, 2 inhalations via spacer device twice a day. Thanks.

## 2019-06-25 ENCOUNTER — Telehealth: Payer: Self-pay

## 2019-06-25 MED ORDER — BUDESONIDE-FORMOTEROL FUMARATE 160-4.5 MCG/ACT IN AERO
INHALATION_SPRAY | RESPIRATORY_TRACT | 5 refills | Status: DC
Start: 2019-06-25 — End: 2019-11-10

## 2019-06-25 NOTE — Telephone Encounter (Signed)
Sent in Symbicort 160 2 puffs via spacer twice daily

## 2019-06-25 NOTE — Telephone Encounter (Signed)
Symbicort sent in.

## 2019-06-26 DIAGNOSIS — G4733 Obstructive sleep apnea (adult) (pediatric): Secondary | ICD-10-CM | POA: Diagnosis not present

## 2019-07-01 ENCOUNTER — Other Ambulatory Visit: Payer: Self-pay

## 2019-07-01 ENCOUNTER — Encounter: Payer: Self-pay | Admitting: Family Medicine

## 2019-07-01 ENCOUNTER — Ambulatory Visit: Payer: Medicare HMO | Admitting: Family Medicine

## 2019-07-01 ENCOUNTER — Ambulatory Visit (INDEPENDENT_AMBULATORY_CARE_PROVIDER_SITE_OTHER): Payer: Medicare HMO

## 2019-07-01 VITALS — BP 122/86 | HR 68 | Ht 69.5 in | Wt 253.0 lb

## 2019-07-01 DIAGNOSIS — M545 Low back pain, unspecified: Secondary | ICD-10-CM

## 2019-07-01 DIAGNOSIS — M999 Biomechanical lesion, unspecified: Secondary | ICD-10-CM | POA: Diagnosis not present

## 2019-07-01 DIAGNOSIS — M542 Cervicalgia: Secondary | ICD-10-CM | POA: Diagnosis not present

## 2019-07-01 DIAGNOSIS — M5136 Other intervertebral disc degeneration, lumbar region: Secondary | ICD-10-CM | POA: Diagnosis not present

## 2019-07-01 DIAGNOSIS — M5030 Other cervical disc degeneration, unspecified cervical region: Secondary | ICD-10-CM

## 2019-07-01 DIAGNOSIS — G8929 Other chronic pain: Secondary | ICD-10-CM | POA: Diagnosis not present

## 2019-07-01 DIAGNOSIS — M51369 Other intervertebral disc degeneration, lumbar region without mention of lumbar back pain or lower extremity pain: Secondary | ICD-10-CM

## 2019-07-01 MED ORDER — GABAPENTIN 100 MG PO CAPS
200.0000 mg | ORAL_CAPSULE | Freq: Every day | ORAL | 0 refills | Status: DC
Start: 2019-07-01 — End: 2019-09-22

## 2019-07-01 NOTE — Assessment & Plan Note (Signed)
Chronic with what appears to be more of a degenerative scoliosis as well.  Responded decently to osteopathic manipulation.  Once again discussed the gabapentin.  X-rays pending.  Discussed over-the-counter medications.  Follow-up again in 4 to 8 weeks

## 2019-07-01 NOTE — Assessment & Plan Note (Addendum)
   Decision today to treat with OMT was based on Physical Exam  After verbal consent patient was treated with ME, FPR techniques in cervical, thoracic,  lumbar and sacral areas, all areas are chronic held on HVLA secondary to not knowing until we have baseline x-rays  Patient tolerated the procedure well with improvement in symptoms  Patient given exercises, stretches and lifestyle modifications  See medications in patient instructions if given  Patient will follow up in 4-8 weeks

## 2019-07-01 NOTE — Assessment & Plan Note (Signed)
Severe arthritic changes as expected.  X-rays ordered today.  Attempted some osteopathic manipulation with mostly muscle energy.  Discussed which activities to do which wants to avoid.  Patient is to increase slowly some exercises.  Gabapentin prescribed at night that I think will be beneficial.  Follow-up again in 4 to 8 weeks

## 2019-07-01 NOTE — Progress Notes (Signed)
[place] [address] [place] Phone: [phone] Subjective:   I Allen [name] am serving as a Education administrator for Dr. Hulan Saas.  This visit occurred during the SARS-CoV-2 public health emergency.  Safety protocols were in place, including screening questions prior to the visit, additional usage of staff PPE, and extensive cleaning of exam room while observing appropriate contact time as indicated for disinfecting solutions.   I'm seeing this patient by the request  of:  Allen Hau, MD  CC: Low back pain neck pain  [id number]  Allen [name] is a 69 y.o. male coming in with complaint of neck and back pain. Patient states he has arthritis. States he has lower back weakness. Patient wakes up in pain. Neck ROM is limited. Patient states he used to have numbness and tingling in his right hand. Patient states that he feels stiff and would like some type of relief. 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  Onset- 30 years  Location - lower back and neck  Duration-  Character- sharp and achy  Aggravating factors- sitting, standing, stairs "everything" Reliving factors-  Therapies tried- Ice, heat, topical, oral, chiropractor  Severity-  8-9/10 at its worse      Past Medical History:  Diagnosis Date  . Asthma   . Bilateral cataracts   . COPD (chronic obstructive pulmonary disease) ([place])   . ED (erectile dysfunction)   . Essential tremor    with nerve stimulator  . GERD (gastroesophageal reflux disease)   . Insomnia   . OSA treated with BiPAP   . Recurrent upper respiratory infection (URI)   . Right carpal tunnel syndrome    Past Surgical History:  Procedure Laterality Date  . ADENOIDECTOMY    . APPENDECTOMY  2015  . CARPAL TUNNEL RELEASE Left 2019  . CATARACT EXTRACTION Bilateral 2020  . DEEP BRAIN STIMULATOR PLACEMENT Left 2016  . HERNIA REPAIR Right 2007  . ROTATOR  CUFF REPAIR Right 2014  . TONSILLECTOMY    . TOTAL SHOULDER REPLACEMENT Left 2016  . VEIN SURGERY Bilateral 2009   Social History   Socioeconomic History  . Marital status: Married    Spouse name: Not on file  . Number of children: 3  . Years of education: Not on file  . Highest education level: Associate degree: occupational, Hotel manager, or vocational program  Occupational History  . Not on file  Tobacco Use  . Smoking status: Former Smoker    Packs/day: 3.00    Years: 20.00    Pack years: 60.00    Types: Cigarettes    Quit date: 01/15/1989    Years since quitting: 30.4  . Smokeless tobacco: Former Network engineer  . Vaping Use: Never used  Substance and Sexual Activity  . Alcohol use: Yes    Alcohol/week: 14.0 standard drinks    Types: 14 Glasses of wine per week    Comment: 1 glass wine per day  . Drug use: Never  . Sexual activity: Yes  Other Topics Concern  . Not on file  Social History Narrative  . Not on file   Social Determinants of Health   Financial Resource Strain:   . Difficulty of Paying Living Expenses:   Food Insecurity:   . Worried About Charity fundraiser in the Last Year:   . Arboriculturist in the Last Year:   Transportation Needs:   .  Lack of Transportation (Medical):   [name] Kitchen Lack of Transportation (Non-Medical):   Physical Activity:   . Days of Exercise per Week:   . Minutes of Exercise per Session:   Stress:   . Feeling of Stress :   Social Connections:   . Frequency of Communication with Friends and Family:   . Frequency of Social Gatherings with Friends and Family:   . Attends Religious Services:   . Active Member of Clubs or Organizations:   . Attends Archivist Meetings:   [name] Kitchen Marital Status:    No Known Allergies Family History  Problem Relation Age of Onset  . COPD Mother   . Asthma Father   . Rectal cancer Sister   . Healthy Child   . Urticaria Neg Hx   . Immunodeficiency Neg Hx   . Eczema Neg Hx   . Atopy Neg Hx     . Angioedema Neg Hx   . Allergic rhinitis Neg Hx     Current Outpatient Medications (Endocrine & Metabolic):  .  predniSONE (DELTASONE) 1 MG tablet, Take 1 mg by mouth daily with breakfast.  Current Outpatient Medications (Cardiovascular):  .  furosemide (LASIX) 20 MG tablet, Take 1 tablet (20 mg total) by mouth daily as needed for fluid.  Current Outpatient Medications (Respiratory):  .  albuterol (PROVENTIL) (2.5 MG/3ML) 0.083% nebulizer solution, Take 3 mLs (2.5 mg total) by nebulization every 6 (six) hours. [name] Kitchen  azelastine (ASTELIN) 0.1 % nasal spray, Place 1-2 sprays into both nostrils 2 (two) times daily as needed for rhinitis. .  Azelastine-Fluticasone 137-50 MCG/ACT SUSP, Place 1 spray into both nostrils 2 (two) times daily as needed. .  benzonatate (TESSALON) 100 MG capsule, Take 1 capsule (100 mg total) by mouth 2 (two) times daily as needed for cough. .  Budeson-Glycopyrrol-Formoterol (BREZTRI AEROSPHERE) 160-9-4.8 MCG/ACT AERO, Inhale 2 puffs into the lungs in the morning and at bedtime. .  budesonide-formoterol (SYMBICORT) 160-4.5 MCG/ACT inhaler, 2 puffs via space twice daily .  chlorpheniramine-HYDROcodone (TUSSIONEX PENNKINETIC ER) 10-8 MG/5ML SUER, Take 5 mLs by mouth every 12 (twelve) hours as needed for cough. .  fluticasone (FLONASE) 50 MCG/ACT nasal spray, Place 2 sprays into both nostrils daily as needed for allergies or rhinitis. [name] Kitchen  montelukast (SINGULAIR) 10 MG tablet, Take 1 tablet (10 mg total) by mouth at bedtime.    Current Outpatient Medications (Other):  .  omeprazole (PRILOSEC) 20 MG capsule, Take 1 capsule (20 mg total) by mouth daily. .  traZODone (DESYREL) 100 MG tablet, Take 200 mg by mouth at bedtime. .  gabapentin (NEURONTIN) 100 MG capsule, Take 2 capsules (200 mg total) by mouth at bedtime.   Reviewed prior external information including notes and imaging from  primary care provider As well as notes that were available from care everywhere and  other healthcare systems.  Past medical history, social, surgical and family history all reviewed in electronic medical record.  No pertanent information unless stated regarding to the chief complaint.   Review of Systems:  No headache, visual changes, nausea, vomiting, diarrhea, constipation, dizziness, abdominal pain, skin rash, fevers, chills, night sweats, weight loss, swollen lymph nodes, body aches, joint swelling, chest pain, shortness of breath, mood changes. POSITIVE muscle aches  Objective  Blood pressure 122/86, pulse 68, height 5' 9.5" (1.765 m), weight 253 lb (114.8 kg), SpO2 96 %.   General: No apparent distress alert and oriented x3 mood and affect normal, dressed appropriately.  HEENT: Pupils equal, extraocular movements intact  Respiratory: Patient's speak in full sentences and does not appear short of breath  Cardiovascular: Trace lower extremity edema, non tender, no erythema  Neuro: Cranial nerves II through XII are intact, neurovascularly intact in all extremities with 2+ DTRs and 2+ pulses.  Gait normal with good balance and coordination.  MSK: Arthritic changes of multiple joints. Low back exam severe loss of lordosis.  Degenerative scoliosis noted.  Tenderness to palpation in the paraspinal musculature at L1-L2 on the right side and sacroiliac joint on the right side.  No spinous process tenderness.  Significant tightness with straight leg test bilaterally.  No true radicular symptoms.  Neck exam also has only about 5 to 10 degrees of extension.  Patient does have crepitus noted.  Negative straight leg test but patient does seem to have more discomfort with side bending and rotation to the right.  Osteopathic findings  C2 flexed rotated and side bent right T6 extended rotated and side bent left L1 flexed rotated and side bent right Sacrum right on right  [id number]; 15 additional minutes spent for Therapeutic exercises as stated in above notes.  This included exercises  focusing on stretching, strengthening, with significant focus on eccentric aspects.   Long term goals include an improvement in range of motion, strength, endurance as well as avoiding reinjury. Patient's frequency would include in 1-2 times a day, 3-5 times a week for a duration of 6-12 weeks. Low back exercises that included:  Pelvic tilt/bracing instruction to focus on control of the pelvic girdle and lower abdominal muscles  Glute strengthening exercises, focusing on proper firing of the glutes without engaging the low back muscles Proper stretching techniques for maximum relief for the hamstrings, hip flexors, low back and some rotation where tolerated    Proper technique shown and discussed handout in great detail with ATC.  All questions were discussed and answered.     Impression and Recommendations:     The above documentation has been reviewed and is accurate and complete Lyndal Pulley, DO       Note: This dictation was prepared with Dragon dictation along with smaller phrase technology. Any transcriptional errors that result from this process are unintentional.

## 2019-07-01 NOTE — Patient Instructions (Addendum)
Good to see you Xray today PT [address] Tart cherry 1200mg  at night Gabapentin 200mg  at bedtime Follow up in 4 weeks

## 2019-07-09 ENCOUNTER — Other Ambulatory Visit: Payer: Self-pay | Admitting: Internal Medicine

## 2019-07-09 ENCOUNTER — Other Ambulatory Visit: Payer: Self-pay | Admitting: Pulmonary Disease

## 2019-07-09 ENCOUNTER — Other Ambulatory Visit: Payer: Self-pay | Admitting: Podiatry

## 2019-07-09 DIAGNOSIS — J449 Chronic obstructive pulmonary disease, unspecified: Secondary | ICD-10-CM

## 2019-07-14 ENCOUNTER — Encounter: Payer: Self-pay | Admitting: Cardiology

## 2019-07-16 ENCOUNTER — Other Ambulatory Visit: Payer: Self-pay | Admitting: Family Medicine

## 2019-07-23 ENCOUNTER — Other Ambulatory Visit: Payer: Self-pay | Admitting: Internal Medicine

## 2019-07-23 DIAGNOSIS — E559 Vitamin D deficiency, unspecified: Secondary | ICD-10-CM

## 2019-07-26 DIAGNOSIS — G4733 Obstructive sleep apnea (adult) (pediatric): Secondary | ICD-10-CM | POA: Diagnosis not present

## 2019-07-27 ENCOUNTER — Ambulatory Visit: Payer: Medicare HMO | Admitting: Pulmonary Disease

## 2019-07-27 NOTE — Progress Notes (Deleted)
@Patient  ID: Allen [name], male    DOB: 09-Aug-1950, 69 y.o.   MRN: [id number]  No chief complaint on file.   Referring provider: Isaac Bliss, [place]*  HPI:  69 year old male former smoker followed in our office for asthma and obstructive sleep apnea  PMH: GERD, tremor, insomnia Smoker/ Smoking History: Former smoker.  Quit 1991.  60-pack-year smoking history. Maintenance: Breo Ellipta 200 Pt of: Dr. Halford Chessman  07/27/2019  - Visit     Questionaires / Pulmonary Flowsheets:   ACT:  No flowsheet data found.  MMRC: mMRC Dyspnea Scale mMRC Score  04/27/2019 3  04/10/2019 0    Epworth:  Results of the Epworth flowsheet 10/16/2018  Sitting and reading 1  Watching TV 1  Sitting, inactive in a public place (e.g. a theatre or a meeting) 0  As a passenger in a car for an hour without a break 0  Lying down to rest in the afternoon when circumstances permit 1  Sitting and talking to someone 0  Sitting quietly after a lunch without alcohol 0  In a car, while stopped for a few minutes in traffic 0  Total score 3    Tests:    Pulmonary tests:  PFT 12/21/16 >> FEV1 2.40 (69%), FEV1% 74, TLC 4.95 (73%), DLCO 77%, +BD  Sleep tests:  HST 04/20/15 >> AHI 40 Auto Bipap 08/08/18 to 11/05/18 >> used on 59 of 90 nights with average 5 hrs 17 min.  Average AHI 3 with median Bipap 14/10 and 95 th Bipap 17/13 cm H2O  04/10/2019-CBC with differential-hemoglobin 13, eosinophils relative 2.4, eosinophils absolute 0.2 04/10/2019-IgE-66 04/10/2019-BNP-33 04/10/2019-chest x-ray-no active cardiopulmonary disease 04/20/2019-pulmonary function test-FVC 2.82 (62% predicted), postbronchodilator ratio 76, FEV1 2.0 (60% predicted), no bronchodilator response, DLCO 24.64 (93% predicted) Patient did use Breo Ellipta prior to exam. Spirometry is suggestive of combined obstructive and restrictive defect, mild restriction confirmed with lung volume testing, normal diffusion capacity.     FENO:  No results  found for: NITRICOXIDE  PFT: PFT Results Latest Ref Rng & Units 04/20/2019  FVC-Pre L 2.82  FVC-Predicted Pre % 62  FVC-Post L 2.66  FVC-Predicted Post % 59  Pre FEV1/FVC % % 74  Post FEV1/FCV % % 76  FEV1-Pre L 2.09  FEV1-Predicted Pre % 62  FEV1-Post L 2.01  DLCO UNC% % 93  DLCO COR %Predicted % 144  TLC L 5.33  TLC % Predicted % 75  RV % Predicted % 94    WALK:  SIX MIN WALK 04/10/2019  Supplimental Oxygen during Test? (L/min) No    Imaging: DG Cervical Spine Complete  Result Date: 07/01/2019 CLINICAL DATA:  Chronic neck pain. EXAM: CERVICAL SPINE - COMPLETE 4+ VIEW COMPARISON:  None. FINDINGS: The lateral view is diagnostic to the C7-T1 level. There is no acute fracture or subluxation. Vertebral body heights are preserved. Straightening of the normal cervical lordosis. Trace retrolisthesis at C3-C4. Severe disc height loss at C3-C4, C5-C6, and C6-C7. Moderate disc height loss at C7-T1. Mild-to-moderate facet uncovertebral hypertrophy throughout the cervical spine. Severe right neuroforaminal stenosis at C3-C4. Normal prevertebral soft tissues. Partially visualized deep brain stimulator leads and generator pack. IMPRESSION: 1. Advanced multilevel cervical spondylosis as described above. 2. Severe right neuroforaminal stenosis at C3-C4. Electronically Signed   By: Titus Dubin M.D.   On: 07/01/2019 15:26   DG Lumbar Spine Complete  Result Date: 07/01/2019 CLINICAL DATA:  Chronic back pain. EXAM: LUMBAR SPINE - COMPLETE 4+ VIEW COMPARISON:  None. FINDINGS: Five lumbar type  vertebral bodies. No acute fracture or subluxation. Vertebral body heights are preserved. Trace stepwise retrolisthesis from L1-L2 through L3-L4. Moderate disc height loss throughout the lumbar spine. Moderate lower lumbar facet arthropathy. The sacroiliac joints are unremarkable. IMPRESSION: 1. Moderate multilevel degenerative changes throughout the lumbar spine. Electronically Signed   By: Titus Dubin M.D.    On: 07/01/2019 15:23    Lab Results:  CBC    Component Value Date/Time   WBC 6.5 05/06/2019 1047   RBC 4.20 (L) 05/06/2019 1047   HGB 13.2 05/06/2019 1047   HCT 39.9 05/06/2019 1047   PLT 304.0 05/06/2019 1047   MCV 95.0 05/06/2019 1047   MCHC 33.0 05/06/2019 1047   RDW 15.1 05/06/2019 1047   LYMPHSABS 1.3 05/06/2019 1047   MONOABS 0.5 05/06/2019 1047   EOSABS 0.2 05/06/2019 1047   BASOSABS 0.1 05/06/2019 1047    BMET    Component Value Date/Time   NA 138 05/06/2019 1047   K 4.5 05/06/2019 1047   CL 104 05/06/2019 1047   CO2 28 05/06/2019 1047   GLUCOSE 100 (H) 05/06/2019 1047   BUN 20 05/06/2019 1047   CREATININE 0.98 05/06/2019 1047   CALCIUM 9.1 05/06/2019 1047    BNP No results found for: BNP  ProBNP    Component Value Date/Time   PROBNP 33.0 04/10/2019 0941    Specialty Problems      Pulmonary Problems   COPD (chronic obstructive pulmonary disease) ([facility])   OSA treated with BiPAP   Allergic rhinitis   Moderate persistent asthma without complication   Shortness of breath   Cough, persistent   Perennial allergic rhinitis probable nonallergic component      No Known Allergies  Immunization History  Administered Date(s) Administered  . Fluad Quad(high Dose 65+) 09/18/2018  . PFIZER SARS-COV-2 Vaccination 02/20/2019, 03/17/2019  . Pneumococcal Polysaccharide-23 01/08/2015  . Pneumococcal-Unspecified 12/15/2017  . Tdap 09/17/2016  . Zoster Recombinat (Shingrix) 06/06/2019    Past Medical History:  Diagnosis Date  . Asthma   . Bilateral cataracts   . COPD (chronic obstructive pulmonary disease) ([place])   . ED (erectile dysfunction)   . Essential tremor    with nerve stimulator  . GERD (gastroesophageal reflux disease)   . Insomnia   . OSA treated with BiPAP   . Recurrent upper respiratory infection (URI)   . Right carpal tunnel syndrome     Tobacco History: Social History   Tobacco Use  Smoking Status Former Smoker  . Packs/day: 3.00   . Years: 20.00  . Pack years: 60.00  . Types: Cigarettes  . Quit date: 01/15/1989  . Years since quitting: 30.5  Smokeless Tobacco Former Air traffic controller given: Not Answered   Continue to not smoke  Outpatient Encounter Medications as of 07/27/2019  Medication Sig  . albuterol (PROVENTIL) (2.5 MG/3ML) 0.083% nebulizer solution Take 3 mLs (2.5 mg total) by nebulization every 6 (six) hours.  [name] Kitchen azelastine (ASTELIN) 0.1 % nasal spray Place 1-2 sprays into both nostrils 2 (two) times daily as needed for rhinitis.  . Azelastine-Fluticasone 137-50 MCG/ACT SUSP Place 1 spray into both nostrils 2 (two) times daily as needed.  . benzonatate (TESSALON) 100 MG capsule TAKE 1 CAPSULE (100 MG TOTAL) BY MOUTH 2 (TWO) TIMES DAILY AS NEEDED FOR COUGH.  . Budeson-Glycopyrrol-Formoterol (BREZTRI AEROSPHERE) 160-9-4.8 MCG/ACT AERO Inhale 2 puffs into the lungs in the morning and at bedtime.  . budesonide-formoterol (SYMBICORT) 160-4.5 MCG/ACT inhaler 2 puffs via space twice daily  . chlorpheniramine-HYDROcodone (  TUSSIONEX PENNKINETIC ER) 10-8 MG/5ML SUER Take 5 mLs by mouth every 12 (twelve) hours as needed for cough.  . fluticasone (FLONASE) 50 MCG/ACT nasal spray Place 2 sprays into both nostrils daily as needed for allergies or rhinitis.  . furosemide (LASIX) 20 MG tablet TAKE 1 TABLET (20 MG TOTAL) BY MOUTH DAILY AS NEEDED FOR FLUID.  [name] Kitchen gabapentin (NEURONTIN) 100 MG capsule Take 2 capsules (200 mg total) by mouth at bedtime.  . montelukast (SINGULAIR) 10 MG tablet Take 1 tablet (10 mg total) by mouth at bedtime.  [name] Kitchen omeprazole (PRILOSEC) 20 MG capsule Take 1 capsule (20 mg total) by mouth daily.  . predniSONE (DELTASONE) 1 MG tablet Take 1 mg by mouth daily with breakfast.  . traZODone (DESYREL) 100 MG tablet Take 200 mg by mouth at bedtime.   No facility-administered encounter medications on file as of 07/27/2019.     Review of Systems  Review of Systems   Physical Exam  There were no vitals  taken for this visit.  Wt Readings from Last 5 Encounters:  07/01/19 253 lb (114.8 kg)  06/23/19 254 lb 6.4 oz (115.4 kg)  06/01/19 256 lb (116.1 kg)  05/26/19 257 lb 3.2 oz (116.7 kg)  05/06/19 253 lb 9.6 oz (115 kg)    BMI Readings from Last 5 Encounters:  07/01/19 36.83 kg/m  06/23/19 37.03 kg/m  06/01/19 37.26 kg/m  05/26/19 36.90 kg/m  05/06/19 36.65 kg/m     Physical Exam    Assessment & Plan:   No problem-specific Assessment & Plan notes found for this encounter.    No follow-ups on file.   [name], NP 07/27/2019   This appointment required *** minutes of patient care (this includes precharting, chart review, review of results, face-to-face care, etc.).

## 2019-07-31 ENCOUNTER — Telehealth: Payer: Self-pay | Admitting: Internal Medicine

## 2019-07-31 DIAGNOSIS — R972 Elevated prostate specific antigen [PSA]: Secondary | ICD-10-CM

## 2019-07-31 NOTE — Telephone Encounter (Signed)
Left message on machine for patient to schedule a lab appointment. Order placed.

## 2019-07-31 NOTE — Telephone Encounter (Signed)
Pt is requesting a lab order for a psa. Pt says, he needs to come in for labs and get this done in our office.  Thanks

## 2019-07-31 NOTE — Addendum Note (Signed)
Addended by: Westley Hummer B on: 07/31/2019 02:22 PM   Modules accepted: Orders

## 2019-07-31 NOTE — Telephone Encounter (Signed)
Ok to order 

## 2019-08-03 ENCOUNTER — Other Ambulatory Visit: Payer: Self-pay

## 2019-08-03 NOTE — Addendum Note (Signed)
Addended by: Marrion Coy on: 08/03/2019 12:01 PM   Modules accepted: Orders

## 2019-08-04 ENCOUNTER — Other Ambulatory Visit: Payer: Medicare HMO

## 2019-08-04 DIAGNOSIS — E559 Vitamin D deficiency, unspecified: Secondary | ICD-10-CM | POA: Diagnosis not present

## 2019-08-04 DIAGNOSIS — R972 Elevated prostate specific antigen [PSA]: Secondary | ICD-10-CM | POA: Diagnosis not present

## 2019-08-05 LAB — VITAMIN D 25 HYDROXY (VIT D DEFICIENCY, FRACTURES): Vit D, 25-Hydroxy: 41 ng/mL (ref 30–100)

## 2019-08-05 LAB — PSA: PSA: 6.4 ng/mL — ABNORMAL HIGH (ref <=4.0)

## 2019-08-06 ENCOUNTER — Ambulatory Visit: Payer: Medicare HMO | Admitting: Physical Therapy

## 2019-08-10 ENCOUNTER — Ambulatory Visit: Payer: Medicare HMO | Admitting: Family Medicine

## 2019-08-10 NOTE — Progress Notes (Deleted)
[facility] [address] [place] Phone: [phone] Subjective:    I'm seeing this patient by the request  of:  Isaac Bliss, Rayford Halsted, MD  CC:   [id number]  Allen [name] is a 70 y.o. male coming in with complaint of ***  Onset-  Location Duration-  Character- Aggravating factors- Reliving factors-  Therapies tried-  Severity-     Past Medical History:  Diagnosis Date  . Asthma   . Bilateral cataracts   . COPD (chronic obstructive pulmonary disease) ([place])   . ED (erectile dysfunction)   . Essential tremor    with nerve stimulator  . GERD (gastroesophageal reflux disease)   . Insomnia   . OSA treated with BiPAP   . Recurrent upper respiratory infection (URI)   . Right carpal tunnel syndrome    Past Surgical History:  Procedure Laterality Date  . ADENOIDECTOMY    . APPENDECTOMY  2015  . CARPAL TUNNEL RELEASE Left 2019  . CATARACT EXTRACTION Bilateral 2020  . DEEP BRAIN STIMULATOR PLACEMENT Left 2016  . HERNIA REPAIR Right 2007  . ROTATOR CUFF REPAIR Right 2014  . TONSILLECTOMY    . TOTAL SHOULDER REPLACEMENT Left 2016  . VEIN SURGERY Bilateral 2009   Social History   Socioeconomic History  . Marital status: Married    Spouse name: Not on file  . Number of children: 3  . Years of education: Not on file  . Highest education level: Associate degree: occupational, Hotel manager, or vocational program  Occupational History  . Not on file  Tobacco Use  . Smoking status: Former Smoker    Packs/day: 3.00    Years: 20.00    Pack years: 60.00    Types: Cigarettes    Quit date: 01/15/1989    Years since quitting: 30.5  . Smokeless tobacco: Former Network engineer  . Vaping Use: Never used  Substance and Sexual Activity  . Alcohol use: Yes    Alcohol/week: 14.0 standard drinks    Types: 14 Glasses of wine per week    Comment: 1 glass wine per day  . Drug use: Never  . Sexual activity: Yes  Other  Topics Concern  . Not on file  Social History Narrative  . Not on file   Social Determinants of Health   Financial Resource Strain:   . Difficulty of Paying Living Expenses:   Food Insecurity:   . Worried About Charity fundraiser in the Last Year:   . Arboriculturist in the Last Year:   Transportation Needs:   . Film/video editor (Medical):   [name] Kitchen Lack of Transportation (Non-Medical):   Physical Activity:   . Days of Exercise per Week:   . Minutes of Exercise per Session:   Stress:   . Feeling of Stress :   Social Connections:   . Frequency of Communication with Friends and Family:   . Frequency of Social Gatherings with Friends and Family:   . Attends Religious Services:   . Active Member of Clubs or Organizations:   . Attends Archivist Meetings:   [name] Kitchen Marital Status:    No Known Allergies Family History  Problem Relation Age of Onset  . COPD Mother   . Asthma Father   . Rectal cancer Sister   . Healthy Child   . Urticaria Neg Hx   . Immunodeficiency Neg Hx   . Eczema Neg Hx   . Atopy Neg  Hx   . Angioedema Neg Hx   . Allergic rhinitis Neg Hx     Current Outpatient Medications (Endocrine & Metabolic):  .  predniSONE (DELTASONE) 1 MG tablet, Take 1 mg by mouth daily with breakfast.  Current Outpatient Medications (Cardiovascular):  .  furosemide (LASIX) 20 MG tablet, TAKE 1 TABLET (20 MG TOTAL) BY MOUTH DAILY AS NEEDED FOR FLUID.  Current Outpatient Medications (Respiratory):  .  albuterol (PROVENTIL) (2.5 MG/3ML) 0.083% nebulizer solution, Take 3 mLs (2.5 mg total) by nebulization every 6 (six) hours. [name] Kitchen  azelastine (ASTELIN) 0.1 % nasal spray, Place 1-2 sprays into both nostrils 2 (two) times daily as needed for rhinitis. .  Azelastine-Fluticasone 137-50 MCG/ACT SUSP, Place 1 spray into both nostrils 2 (two) times daily as needed. .  benzonatate (TESSALON) 100 MG capsule, TAKE 1 CAPSULE (100 MG TOTAL) BY MOUTH 2 (TWO) TIMES DAILY AS NEEDED FOR  COUGH. .  Budeson-Glycopyrrol-Formoterol (BREZTRI AEROSPHERE) 160-9-4.8 MCG/ACT AERO, Inhale 2 puffs into the lungs in the morning and at bedtime. .  budesonide-formoterol (SYMBICORT) 160-4.5 MCG/ACT inhaler, 2 puffs via space twice daily .  chlorpheniramine-HYDROcodone (TUSSIONEX PENNKINETIC ER) 10-8 MG/5ML SUER, Take 5 mLs by mouth every 12 (twelve) hours as needed for cough. .  fluticasone (FLONASE) 50 MCG/ACT nasal spray, Place 2 sprays into both nostrils daily as needed for allergies or rhinitis. [name] Kitchen  montelukast (SINGULAIR) 10 MG tablet, Take 1 tablet (10 mg total) by mouth at bedtime.    Current Outpatient Medications (Other):  .  gabapentin (NEURONTIN) 100 MG capsule, Take 2 capsules (200 mg total) by mouth at bedtime. [name] Kitchen  omeprazole (PRILOSEC) 20 MG capsule, Take 1 capsule (20 mg total) by mouth daily. .  traZODone (DESYREL) 100 MG tablet, Take 200 mg by mouth at bedtime.   Reviewed prior external information including notes and imaging from  primary care provider As well as notes that were available from care everywhere and other healthcare systems.  Past medical history, social, surgical and family history all reviewed in electronic medical record.  No pertanent information unless stated regarding to the chief complaint.   Review of Systems:  No headache, visual changes, nausea, vomiting, diarrhea, constipation, dizziness, abdominal pain, skin rash, fevers, chills, night sweats, weight loss, swollen lymph nodes, body aches, joint swelling, chest pain, shortness of breath, mood changes. POSITIVE muscle aches  Objective  There were no vitals taken for this visit.   General: No apparent distress alert and oriented x3 mood and affect normal, dressed appropriately.  HEENT: Pupils equal, extraocular movements intact  Respiratory: Patient's speak in full sentences and does not appear short of breath  Cardiovascular: No lower extremity edema, non tender, no erythema  Neuro: Cranial  nerves II through XII are intact, neurovascularly intact in all extremities with 2+ DTRs and 2+ pulses.  Gait normal with good balance and coordination.  MSK:  Non tender with full range of motion and good stability and symmetric strength and tone of shoulders, elbows, wrist, hip, knee and ankles bilaterally.     Impression and Recommendations:     The above documentation has been reviewed and is accurate and complete Lyndal Pulley, DO       Note: This dictation was prepared with Dragon dictation along with smaller phrase technology. Any transcriptional errors that result from this process are unintentional.

## 2019-08-11 ENCOUNTER — Ambulatory Visit: Payer: Medicare HMO | Admitting: Allergy & Immunology

## 2019-08-13 ENCOUNTER — Telehealth (HOSPITAL_COMMUNITY): Payer: Self-pay | Admitting: *Deleted

## 2019-08-20 DIAGNOSIS — R351 Nocturia: Secondary | ICD-10-CM | POA: Diagnosis not present

## 2019-08-20 DIAGNOSIS — R972 Elevated prostate specific antigen [PSA]: Secondary | ICD-10-CM | POA: Diagnosis not present

## 2019-08-20 DIAGNOSIS — N5201 Erectile dysfunction due to arterial insufficiency: Secondary | ICD-10-CM | POA: Diagnosis not present

## 2019-08-20 DIAGNOSIS — N401 Enlarged prostate with lower urinary tract symptoms: Secondary | ICD-10-CM | POA: Diagnosis not present

## 2019-08-20 DIAGNOSIS — R350 Frequency of micturition: Secondary | ICD-10-CM | POA: Diagnosis not present

## 2019-08-20 LAB — PSA: PSA: 5.96

## 2019-08-25 ENCOUNTER — Encounter: Payer: Self-pay | Admitting: Family Medicine

## 2019-08-25 ENCOUNTER — Other Ambulatory Visit: Payer: Self-pay

## 2019-08-25 ENCOUNTER — Ambulatory Visit: Payer: Medicare HMO | Admitting: Family Medicine

## 2019-08-25 VITALS — BP 146/80 | HR 82 | Ht 69.5 in | Wt 261.0 lb

## 2019-08-25 DIAGNOSIS — M5030 Other cervical disc degeneration, unspecified cervical region: Secondary | ICD-10-CM | POA: Diagnosis not present

## 2019-08-25 DIAGNOSIS — M5136 Other intervertebral disc degeneration, lumbar region: Secondary | ICD-10-CM

## 2019-08-25 DIAGNOSIS — M999 Biomechanical lesion, unspecified: Secondary | ICD-10-CM | POA: Diagnosis not present

## 2019-08-25 NOTE — Assessment & Plan Note (Signed)
Stable at the moment.  Patient is quite motivated at the moment and I am hoping that he will continue to make good improvement.  Discussed which activities to do which wants to avoid.  Increase activity slowly.  Follow-up again in 4 to 8 weeks.  Continue gabapentin for now

## 2019-08-25 NOTE — Progress Notes (Signed)
[facility] [address] [place] Phone: [phone] Subjective:   I [name] am serving as a Education administrator for Dr. Hulan Saas.  This visit occurred during the SARS-CoV-2 public health emergency.  Safety protocols were in place, including screening questions prior to the visit, additional usage of staff PPE, and extensive cleaning of exam room while observing appropriate contact time as indicated for disinfecting solutions.   I'm seeing this patient by the request  of:  Isaac Bliss, Rayford Halsted, MD  CC: Low back pain follow-up  [id number]  Allen [name] is a 69 y.o. male coming in with complaint of back pain. OMT 07/01/2019. Patient states he is doing well. Stated working with a Physiological scientist. Started a new diet as well.  Patient is doing some of the over-the-counter medications, trying to stay active.  Is responding well to the manipulation previously.     Past Medical History:  Diagnosis Date  . Asthma   . Bilateral cataracts   . COPD (chronic obstructive pulmonary disease) ([place])   . ED (erectile dysfunction)   . Essential tremor    with nerve stimulator  . GERD (gastroesophageal reflux disease)   . Insomnia   . OSA treated with BiPAP   . Recurrent upper respiratory infection (URI)   . Right carpal tunnel syndrome    Past Surgical History:  Procedure Laterality Date  . ADENOIDECTOMY    . APPENDECTOMY  2015  . CARPAL TUNNEL RELEASE Left 2019  . CATARACT EXTRACTION Bilateral 2020  . DEEP BRAIN STIMULATOR PLACEMENT Left 2016  . HERNIA REPAIR Right 2007  . ROTATOR CUFF REPAIR Right 2014  . TONSILLECTOMY    . TOTAL SHOULDER REPLACEMENT Left 2016  . VEIN SURGERY Bilateral 2009   Social History   Socioeconomic History  . Marital status: Married    Spouse name: Not on file  . Number of children: 3  . Years of education: Not on file  . Highest education level: Associate degree: occupational, Hotel manager, or  vocational program  Occupational History  . Not on file  Tobacco Use  . Smoking status: Former Smoker    Packs/day: 3.00    Years: 20.00    Pack years: 60.00    Types: Cigarettes    Quit date: 01/15/1989    Years since quitting: 30.6  . Smokeless tobacco: Former Network engineer  . Vaping Use: Never used  Substance and Sexual Activity  . Alcohol use: Yes    Alcohol/week: 14.0 standard drinks    Types: 14 Glasses of wine per week    Comment: 1 glass wine per day  . Drug use: Never  . Sexual activity: Yes  Other Topics Concern  . Not on file  Social History Narrative  . Not on file   Social Determinants of Health   Financial Resource Strain:   . Difficulty of Paying Living Expenses:   Food Insecurity:   . Worried About Charity fundraiser in the Last Year:   . Arboriculturist in the Last Year:   Transportation Needs:   . Film/video editor (Medical):   [name] Kitchen Lack of Transportation (Non-Medical):   Physical Activity:   . Days of Exercise per Week:   . Minutes of Exercise per Session:   Stress:   . Feeling of Stress :   Social Connections:   . Frequency of Communication with Friends and Family:   . Frequency of Social Gatherings with Friends and  Family:   . Attends Religious Services:   . Active Member of Clubs or Organizations:   . Attends Archivist Meetings:   [name] Kitchen Marital Status:    No Known Allergies Family History  Problem Relation Age of Onset  . COPD Mother   . Asthma Father   . Rectal cancer Sister   . Healthy Child   . Urticaria Neg Hx   . Immunodeficiency Neg Hx   . Eczema Neg Hx   . Atopy Neg Hx   . Angioedema Neg Hx   . Allergic rhinitis Neg Hx     Current Outpatient Medications (Endocrine & Metabolic):  .  predniSONE (DELTASONE) 1 MG tablet, Take 1 mg by mouth daily with breakfast.  Current Outpatient Medications (Cardiovascular):  .  furosemide (LASIX) 20 MG tablet, TAKE 1 TABLET (20 MG TOTAL) BY MOUTH DAILY AS NEEDED FOR  FLUID.  Current Outpatient Medications (Respiratory):  .  albuterol (PROVENTIL) (2.5 MG/3ML) 0.083% nebulizer solution, Take 3 mLs (2.5 mg total) by nebulization every 6 (six) hours. [name] Kitchen  azelastine (ASTELIN) 0.1 % nasal spray, Place 1-2 sprays into both nostrils 2 (two) times daily as needed for rhinitis. .  Azelastine-Fluticasone 137-50 MCG/ACT SUSP, Place 1 spray into both nostrils 2 (two) times daily as needed. .  benzonatate (TESSALON) 100 MG capsule, TAKE 1 CAPSULE (100 MG TOTAL) BY MOUTH 2 (TWO) TIMES DAILY AS NEEDED FOR COUGH. .  Budeson-Glycopyrrol-Formoterol (BREZTRI AEROSPHERE) 160-9-4.8 MCG/ACT AERO, Inhale 2 puffs into the lungs in the morning and at bedtime. .  budesonide-formoterol (SYMBICORT) 160-4.5 MCG/ACT inhaler, 2 puffs via space twice daily .  chlorpheniramine-HYDROcodone (TUSSIONEX PENNKINETIC ER) 10-8 MG/5ML SUER, Take 5 mLs by mouth every 12 (twelve) hours as needed for cough. .  fluticasone (FLONASE) 50 MCG/ACT nasal spray, Place 2 sprays into both nostrils daily as needed for allergies or rhinitis. [name] Kitchen  montelukast (SINGULAIR) 10 MG tablet, Take 1 tablet (10 mg total) by mouth at bedtime.    Current Outpatient Medications (Other):  .  gabapentin (NEURONTIN) 100 MG capsule, Take 2 capsules (200 mg total) by mouth at bedtime. [name] Kitchen  omeprazole (PRILOSEC) 20 MG capsule, Take 1 capsule (20 mg total) by mouth daily. .  traZODone (DESYREL) 100 MG tablet, Take 200 mg by mouth at bedtime.   Reviewed prior external information including notes and imaging from  primary care provider As well as notes that were available from care everywhere and other healthcare systems.  Past medical history, social, surgical and family history all reviewed in electronic medical record.  No pertanent information unless stated regarding to the chief complaint.   Review of Systems:  No headache, visual changes, nausea, vomiting, diarrhea, constipation, dizziness, abdominal pain, skin rash, fevers,  chills, night sweats, weight loss, swollen lymph nodes, body aches, joint swelling, chest pain, shortness of breath, mood changes. POSITIVE muscle aches  Objective  Blood pressure (!) 146/80, pulse 82, height 5' 9.5" (1.765 m), weight 261 lb (118.4 kg), SpO2 95 %.   General: No apparent distress alert and oriented x3 mood and affect normal, dressed appropriately.  HEENT: Pupils equal, extraocular movements intact  Respiratory: Patient's speak in full sentences and does not appear short of breath  Cardiovascular: No lower extremity edema, non tender, no erythema  Neuro: Cranial nerves II through XII are intact, neurovascularly intact in all extremities with 2+ DTRs and 2+ pulses.  Gait mild antalgic MSK: Arthritic changes of multiple joints Back exam shows loss of lordosis with some mild degenerative scoliosis.  Tightness noted with straight leg test.  Mild tightness with FABER test secondary to body habitus Neck exam does have some mild loss of lordosis but negative Spurling's.  Osteopathic findings C2 flexed rotated and side bent right C6 flexed rotated and side bent left T9 extended rotated and side bent left L2 flexed rotated and side bent left Sacrum right on right     Impression and Recommendations:     The above documentation has been reviewed and is accurate and complete Lyndal Pulley, DO       Note: This dictation was prepared with Dragon dictation along with smaller phrase technology. Any transcriptional errors that result from this process are unintentional.

## 2019-08-25 NOTE — Patient Instructions (Addendum)
Good to see you Doing great Keep working with trainer I like the diet See me again in 6 weeks

## 2019-08-25 NOTE — Assessment & Plan Note (Signed)
   Decision today to treat with OMT was based on Physical Exam  After verbal consent patient was treated with HVLA, ME, FPR techniques in cervical, thoracic,  lumbar and sacral areas, all areas are chronic   Patient tolerated the procedure well with improvement in symptoms  Patient given exercises, stretches and lifestyle modifications  See medications in patient instructions if given  Patient will follow up in 4-8 weeks 

## 2019-08-27 ENCOUNTER — Other Ambulatory Visit: Payer: Self-pay

## 2019-08-27 ENCOUNTER — Encounter: Payer: Self-pay | Admitting: Allergy & Immunology

## 2019-08-27 ENCOUNTER — Ambulatory Visit: Payer: Medicare HMO | Admitting: Allergy & Immunology

## 2019-08-27 VITALS — BP 120/70 | HR 74 | Temp 98.7°F | Resp 18 | Wt 260.2 lb

## 2019-08-27 DIAGNOSIS — J3089 Other allergic rhinitis: Secondary | ICD-10-CM | POA: Diagnosis not present

## 2019-08-27 DIAGNOSIS — J418 Mixed simple and mucopurulent chronic bronchitis: Secondary | ICD-10-CM

## 2019-08-27 DIAGNOSIS — K219 Gastro-esophageal reflux disease without esophagitis: Secondary | ICD-10-CM | POA: Diagnosis not present

## 2019-08-27 DIAGNOSIS — J449 Chronic obstructive pulmonary disease, unspecified: Secondary | ICD-10-CM | POA: Diagnosis not present

## 2019-08-27 MED ORDER — AZELASTINE HCL 0.1 % NA SOLN: 1.0000 | Freq: Two times a day (BID) | NASAL | 5 refills | Status: DC | PRN

## 2019-08-27 MED ORDER — BREZTRI AEROSPHERE 160-9-4.8 MCG/ACT IN AERO: 2.0000 | INHALATION_SPRAY | Freq: Two times a day (BID) | RESPIRATORY_TRACT | 5 refills | Status: DC

## 2019-08-27 MED ORDER — FLUTICASONE PROPIONATE 50 MCG/ACT NA SUSP: 2.0000 | Freq: Every day | NASAL | 5 refills | Status: DC | PRN

## 2019-08-27 NOTE — Progress Notes (Signed)
FOLLOW UP  Date of Service/Encounter:  08/27/19   Assessment:   Chronic obstructive pulmonary disease  Perennial allergic rhinitis probable nonallergic component (indoor molds, outdoor molds, dog)  Gastroesophageal reflux disease  Plan/Recommendations:   1. Chronic obstructive pulmonary disease - Lung testing was in the 50% range (around where it was after Dr. [name] gave you the albuterol treatment at your last visit). - I am hopeful that it will be even better once we are on Breztri for a longer duration. - We are going to fill out the PA for the [place], but in the meantime, we are going to give you some samples. - Daily controller medication(s): Breztri two puffs twice daily with spacer - Prior to physical activity: albuterol 2 puffs 10-15 minutes before physical activity. - Rescue medications: albuterol 4 puffs every 4-6 hours as needed - Asthma control goals:  * Full participation in all desired activities (may need albuterol before activity) * Albuterol use two time or less a week on average (not counting use with activity) * Cough interfering with sleep two time or less a month * Oral steroids no more than once a year * No hospitalizations  2. Perennial allergic rhinitis probable nonallergic component - Continue with Dymista one spray per nostril twice daily as needed. - Continue with nasal saline rinses as needed.  3. Gastroesophageal reflux disease - Continue with omeprazole daily.  4. Return in about 3 months (around 11/27/2019). This can be an in-person, a virtual Webex or a telephone follow up visit.  Subjective:   Allen [name] is a 69 y.o. male presenting today for follow up of  Chief Complaint  Patient presents with  . Allergic Rhinitis     nasal sprays are helping, taking as needed  . Cough    breztri has helped with cough, but had to switch to Symbicort due to insurance     Allen [name] has a history of the following: Patient Active Problem  List   Diagnosis Date Noted  . Degenerative disc disease, cervical 07/01/2019  . Degenerative disc disease, lumbar 07/01/2019  . Nonallopathic lesion of lumbar region 07/01/2019  . Varicose veins of both lower extremities 06/23/2019  . Chronic diastolic CHF (congestive heart failure) ([place]) 06/02/2019  . Perennial allergic rhinitis probable nonallergic component 06/01/2019  . Cough, persistent 06/01/2019  . Vitamin D deficiency 05/07/2019  . Moderate persistent asthma without complication 62/83/6629  . Allergic rhinitis 04/10/2019  . Shortness of breath 04/10/2019  . GERD (gastroesophageal reflux disease)   . COPD (chronic obstructive pulmonary disease) ([place])   . OSA treated with BiPAP   . Right carpal tunnel syndrome   . Essential tremor   . Insomnia   . ED (erectile dysfunction)   . Ulcerative colitis ([place]) 06/30/2014    History obtained from: chart review and patient.  Allen [name] is a 69 y.o. male presenting for a follow up visit.  He was last seen in May 2021 by Dr. [name].  At that time, he was started on Breztri 2 puffs twice daily for his COPD as well as albuterol as needed.  For his allergic rhinitis, he was started on  Dymista 1 spray per nostril twice daily. He had testing that was positive to mold mix #1, mold mix #2, and dog.   Since the last visit, he has done fairly well. He really liked the Breztri from the last visit, but he ran out of samples and his insurance would not cover it. Therefore he was changed to  Symbicort. He felt that the Symbicort did not provide as much protection against his symptoms compared to the [place].  At this point, he has tried and failed Anoro, [place], Trelegy, [place], and Advair.  Asthma/Respiratory Symptom History: On the Symbicort, he does endorse some shortness of breath is worse than when he was on the [place].  ACT score 17, indicating subpar asthma control.  He has not been using his rescue inhaler much at all.  He does report some shortness of  breath especially with physical activity.  He goes to the gym and has a trainer, but he notices that he just cannot do as much as he did before.  Allergic Rhinitis Symptom History: He remains on the [place].  He does not use an antihistamine.  He has not required antibiotics since last visit.  He does use nasal saline rinses occasionally.  He has left ear for approximately 1 year.  Prior to this, he lived in [place].  He does have a stepson who lives in the [place] area and he has enjoyed living here.  He was over the heat and the large crowds in [place].  Otherwise, there have been no changes to his past medical history, surgical history, family history, or social history.    Review of Systems  Constitutional: Negative for chills, fever, malaise/fatigue and weight loss.  HENT: Negative.  Negative for congestion, ear discharge and ear pain.   Eyes: Negative for pain, discharge and redness.  Respiratory: Positive for cough and shortness of breath. Negative for sputum production and wheezing.   Cardiovascular: Negative.  Negative for chest pain and palpitations.  Gastrointestinal: Negative for abdominal pain, constipation, diarrhea, heartburn, nausea and vomiting.  Skin: Negative.  Negative for itching and rash.  Neurological: Negative for dizziness and headaches.  Endo/Heme/Allergies: Negative for environmental allergies. Does not bruise/bleed easily.       Objective:   Blood pressure 120/70, pulse 74, temperature 98.7 F (37.1 C), temperature source Temporal, resp. rate 18, weight 260 lb 3.2 oz (118 kg), SpO2 95 %. Body mass index is 37.87 kg/m.   Physical Exam:  Physical Exam Constitutional:      Appearance: He is well-developed.     Comments: Very talkative male.  HENT:     Head: Normocephalic and atraumatic.     Right Ear: Tympanic membrane, ear canal and external ear normal.     Left Ear: Tympanic membrane, ear canal and external ear normal.     Nose: No nasal  deformity, septal deviation, mucosal edema or rhinorrhea.     Right Turbinates: Enlarged and swollen.     Left Turbinates: Enlarged and swollen.     Right Sinus: No maxillary sinus tenderness or frontal sinus tenderness.     Left Sinus: No maxillary sinus tenderness or frontal sinus tenderness.     Mouth/Throat:     Mouth: Mucous membranes are not pale and not dry.     Pharynx: Uvula midline.     Comments: Cobblestoning present in the posterior oropharynx. Eyes:     General:        Right eye: No discharge.        Left eye: No discharge.     Conjunctiva/sclera: Conjunctivae normal.     Right eye: Right conjunctiva is not injected. No chemosis.    Left eye: Left conjunctiva is not injected. No chemosis.    Pupils: Pupils are equal, round, and reactive to light.  Cardiovascular:     Rate and Rhythm: Normal rate  and regular rhythm.     Heart sounds: Normal heart sounds.  Pulmonary:     Effort: Pulmonary effort is normal. No tachypnea, accessory muscle usage or respiratory distress.     Breath sounds: Normal breath sounds. No wheezing, rhonchi or rales.  Chest:     Chest wall: No tenderness.  Lymphadenopathy:     Cervical: No cervical adenopathy.  Skin:    Coloration: Skin is not pale.     Findings: No abrasion, erythema, petechiae or rash. Rash is not papular, urticarial or vesicular.     Comments: No eczematous or urticarial lesions noted.  Neurological:     Mental Status: He is alert.  Psychiatric:        Behavior: Behavior is cooperative.      Diagnostic studies:    Spirometry: results abnormal (FEV1: 1.65/52%, FVC: 2.30/54%, FEV1/FVC: 72%).    Spirometry consistent with possible restrictive disease.   Allergy Studies: none       Salvatore Marvel, MD  [facility] [place]

## 2019-08-27 NOTE — Patient Instructions (Addendum)
1. Chronic obstructive pulmonary disease - Lung testing was in the 50% range (around where it was after Dr. [name] gave you the albuterol treatment at your last visit). - I am hopeful that it will be even better once we are on Breztri for a longer duration. - We are going to fill out the PA for the [place] [place], but in the meantime, we are going to give you some samples. - Daily controller medication(s): Breztri two puffs twice daily with spacer - Prior to physical activity: albuterol 2 puffs 10-15 minutes before physical activity. - Rescue medications: albuterol 4 puffs every 4-6 hours as needed - Asthma control goals:  * Full participation in all desired activities (may need albuterol before activity) * Albuterol use two time or less a week on average (not counting use with activity) * Cough interfering with sleep two time or less a month * Oral steroids no more than once a year * No hospitalizations  2. Perennial allergic rhinitis probable nonallergic component - Continue with Dymista one spray per nostril twice daily as needed. - Continue with nasal saline rinses as needed.  3. Gastroesophageal reflux disease - Continue with omeprazole daily.  4. Return in about 3 months (around 11/27/2019). This can be an in-person, a virtual Webex or a telephone follow up visit.   Please inform us of any Emergency Department visits, hospitalizations, or changes in symptoms. Call us before going to the ED for breathing or allergy symptoms since we might be able to fit you in for a sick visit. Feel free to contact us anytime with any questions, problems, or concerns.  It was a pleasure to meet you today!  Websites that have reliable patient information: 1. American Academy of Asthma, Allergy, and Immunology: www.aaaai.org 2. Food Allergy Research and Education (FARE): foodallergy.org 3. Mothers of Asthmatics: http://www.asthmacommunitynetwork.org 4. American College of Allergy, Asthma, and Immunology:  www.acaai.org   COVID-19 Vaccine Information can be found at: ShippingScam.co.uk For questions related to vaccine distribution or appointments, please email [email or web address] or call [phone].     "Like" [place] on Facebook and Instagram for our latest updates!        Make sure you are registered to vote! If you have moved or changed any of your contact information, you will need to get this updated before voting!  In some cases, you MAY be able to register to vote online: [email or web address]

## 2019-09-01 ENCOUNTER — Encounter (HOSPITAL_COMMUNITY): Payer: Self-pay

## 2019-09-01 ENCOUNTER — Encounter (HOSPITAL_COMMUNITY)
Admission: RE | Admit: 2019-09-01 | Discharge: 2019-09-01 | Disposition: A | Discharge status: Home / Self Care | Payer: Medicare HMO | Source: Ambulatory Visit | Attending: Pulmonary Disease | Admitting: Pulmonary Disease

## 2019-09-01 ENCOUNTER — Other Ambulatory Visit: Payer: Self-pay

## 2019-09-01 VITALS — BP 112/78 | HR 80 | Resp 18 | Ht 70.0 in | Wt 258.6 lb

## 2019-09-01 DIAGNOSIS — J438 Other emphysema: Secondary | ICD-10-CM | POA: Diagnosis present

## 2019-09-01 NOTE — Progress Notes (Signed)
Pulmonary Individual Treatment Plan  Patient Details  Name: Allen [name] MRN: [id number] Date of Birth: Jul 26, 1950 Referring Provider:    Initial Encounter Date:   Visit Diagnosis: Other emphysema ([place])  Patient's Home Medications on Admission:   Current Outpatient Medications:  .  albuterol (PROVENTIL) (2.5 MG/3ML) 0.083% nebulizer solution, Take 3 mLs (2.5 mg total) by nebulization every 6 (six) hours., Disp: 150 mL, Rfl: 3 .  azelastine (ASTELIN) 0.1 % nasal spray, Place 1-2 sprays into both nostrils 2 (two) times daily as needed for rhinitis., Disp: 30 mL, Rfl: 5 .  Budeson-Glycopyrrol-Formoterol (BREZTRI AEROSPHERE) 160-9-4.8 MCG/ACT AERO, Inhale 2 puffs into the lungs in the morning and at bedtime., Disp: 10.7 g, Rfl: 5 .  furosemide (LASIX) 20 MG tablet, TAKE 1 TABLET (20 MG TOTAL) BY MOUTH DAILY AS NEEDED FOR FLUID., Disp: 90 tablet, Rfl: 1 .  montelukast (SINGULAIR) 10 MG tablet, Take 1 tablet (10 mg total) by mouth at bedtime., Disp: 90 tablet, Rfl: 1 .  omeprazole (PRILOSEC) 20 MG capsule, Take 1 capsule (20 mg total) by mouth daily., Disp: 30 capsule, Rfl: 5 .  traZODone (DESYREL) 100 MG tablet, Take 200 mg by mouth at bedtime., Disp: , Rfl:  .  Azelastine-Fluticasone 137-50 MCG/ACT SUSP, Place 1 spray into both nostrils 2 (two) times daily as needed. (Patient not taking: Reported on 08/27/2019), Disp: 23 g, Rfl: 5 .  benzonatate (TESSALON) 100 MG capsule, TAKE 1 CAPSULE (100 MG TOTAL) BY MOUTH 2 (TWO) TIMES DAILY AS NEEDED FOR COUGH. (Patient not taking: Reported on 09/01/2019), Disp: 30 capsule, Rfl: 0 .  budesonide-formoterol (SYMBICORT) 160-4.5 MCG/ACT inhaler, 2 puffs via space twice daily (Patient not taking: Reported on 09/01/2019), Disp: 1 Inhaler, Rfl: 5 .  chlorpheniramine-HYDROcodone (TUSSIONEX PENNKINETIC ER) 10-8 MG/5ML SUER, Take 5 mLs by mouth every 12 (twelve) hours as needed for cough. (Patient not taking: Reported on 09/01/2019), Disp: 140 mL, Rfl: 0 .  fluticasone  (FLONASE) 50 MCG/ACT nasal spray, Place 2 sprays into both nostrils daily as needed for allergies or rhinitis. (Patient not taking: Reported on 09/01/2019), Disp: 16 g, Rfl: 5 .  gabapentin (NEURONTIN) 100 MG capsule, Take 2 capsules (200 mg total) by mouth at bedtime. (Patient not taking: Reported on 08/27/2019), Disp: 180 capsule, Rfl: 0 .  predniSONE (DELTASONE) 1 MG tablet, Take 1 mg by mouth daily with breakfast. (Patient not taking: Reported on 08/27/2019), Disp: , Rfl:   Past Medical History: Past Medical History:  Diagnosis Date  . Asthma   . Bilateral cataracts   . COPD (chronic obstructive pulmonary disease) ([place])   . ED (erectile dysfunction)   . Essential tremor    with nerve stimulator  . GERD (gastroesophageal reflux disease)   . Insomnia   . OSA treated with BiPAP   . Recurrent upper respiratory infection (URI)   . Right carpal tunnel syndrome     Tobacco Use: Social History   Tobacco Use  Smoking Status Former Smoker  . Packs/day: 3.00  . Years: 20.00  . Pack years: 60.00  . Types: Cigarettes  . Quit date: 01/15/1989  . 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 [employer] Cardiac and Pulmonary Rehab Latest Ref Rng & Units 05/06/2019   Cholestrol 0 - 200 mg/dL 171   LDLCALC 0 - 99 mg/dL 86   HDL >39.00 mg/dL 62.90   Trlycerides 0 - 149 mg/dL 107.0   Hemoglobin A1c 4.6 -  6.5 % 5.6      Capillary Blood Glucose: No results found for: GLUCAP   Pulmonary Assessment Scores:  Pulmonary Assessment Scores    Row Name 09/01/19 1500         ADL UCSD   ADL Phase Entry     SOB Score total 32       CAT Score   CAT Score 13       mMRC Score   mMRC Score 2           UCSD: Self-administered rating of dyspnea associated with activities of daily living (ADLs) 6-point scale (0 = "not at all" to 5 = "maximal or unable to do because of breathlessness")  Scoring Scores range from 0 to 120.  Minimally  important difference is 5 units  CAT: CAT can identify the health impairment of COPD patients and is better correlated with disease progression.  CAT has a scoring range of zero to 40. The CAT score is classified into four groups of low (less than 10), medium (10 - 20), high (21-30) and very high (31-40) based on the impact level of disease on health status. A CAT score over 10 suggests significant symptoms.  A worsening CAT score could be explained by an exacerbation, poor medication adherence, poor inhaler technique, or progression of COPD or comorbid conditions.  CAT MCID is 2 points  mMRC: mMRC (Modified Medical Research Council) Dyspnea Scale is used to assess the degree of baseline functional disability in patients of respiratory disease due to dyspnea. No minimal important difference is established. A decrease in score of 1 point or greater is considered a positive change.   Pulmonary Function Assessment:   Exercise Target Goals: Exercise Program Goal: Individual exercise prescription set using results from initial 6 min walk test and THRR while considering  patient's activity barriers and safety.   Exercise Prescription Goal: Initial exercise prescription builds to 30-45 minutes a day of aerobic activity, 2-3 days per week.  Home exercise guidelines will be given to patient during program as part of exercise prescription that the participant will acknowledge.  Activity Barriers & Risk Stratification:  Activity Barriers & Cardiac Risk Stratification - 09/01/19 0903      Activity Barriers & Cardiac Risk Stratification   Activity Barriers Arthritis;Back Problems;Neck/Spine Problems;Shortness of Breath    Cardiac Risk Stratification Low           6 Minute Walk:  6 Minute Walk    Row Name 09/01/19 1505         6 Minute Walk   Distance 1615 feet     Walk Time 6 minutes     # of Rest Breaks 0     MPH 3.05     METS 3.09     RPE 12     Perceived Dyspnea  1     VO2 Peak  10.84     Symptoms No     Resting HR 75 bpm     Resting BP 128/82     Resting Oxygen Saturation  95 %     Exercise Oxygen Saturation  during 6 min walk 93 %     Max Ex. HR 108 bpm     Max Ex. BP 140/76     2 Minute Post BP 126/80       Interval HR   1 Minute HR 108     2 Minute HR 108     3 Minute HR 105     4  Minute HR 105     5 Minute HR 107     6 Minute HR 105     2 Minute Post HR 81     Interval Heart Rate? Yes       Interval Oxygen   Interval Oxygen? Yes     Baseline Oxygen Saturation % 95 %     1 Minute Oxygen Saturation % 93 %     1 Minute Liters of Oxygen 0 L     2 Minute Oxygen Saturation % 93 %     2 Minute Liters of Oxygen 0 L     3 Minute Oxygen Saturation % 94 %     3 Minute Liters of Oxygen 0 L     4 Minute Oxygen Saturation % 94 %     4 Minute Liters of Oxygen 0 L     5 Minute Oxygen Saturation % 95 %     5 Minute Liters of Oxygen 0 L     6 Minute Oxygen Saturation % 95 %     6 Minute Liters of Oxygen 0 L     2 Minute Post Oxygen Saturation % 97 %     2 Minute Post Liters of Oxygen 0 L            Oxygen Initial Assessment:  Oxygen Initial Assessment - 09/01/19 1504      Home Oxygen   Home Oxygen Device None    Sleep Oxygen Prescription None    Home Exercise Oxygen Prescription None    Home at Rest Exercise Oxygen Prescription None    Compliance with Home Oxygen Use Yes      Initial 6 min Walk   Oxygen Used None      Program Oxygen Prescription   Program Oxygen Prescription None      Intervention   Short Term Goals To learn and exhibit compliance with exercise, home and travel O2 prescription;To learn and understand importance of monitoring SPO2 with pulse oximeter and demonstrate accurate use of the pulse oximeter.;To learn and understand importance of maintaining oxygen saturations>88%;To learn and demonstrate proper pursed lip breathing techniques or other breathing techniques.;To learn and demonstrate proper use of respiratory medications     Long  Term Goals Verbalizes importance of monitoring SPO2 with pulse oximeter and return demonstration;Exhibits compliance with exercise, home and travel O2 prescription;Maintenance of O2 saturations>88%;Exhibits proper breathing techniques, such as pursed lip breathing or other method taught during program session;Demonstrates proper use of MDI's;Compliance with respiratory medication           Oxygen Re-Evaluation:   Oxygen Discharge (Final Oxygen Re-Evaluation):   Initial Exercise Prescription:   Perform Capillary Blood Glucose checks as needed.  Exercise Prescription Changes:   Exercise Comments:   Exercise Goals and Review:   Exercise Goals Re-Evaluation :   Discharge Exercise Prescription (Final Exercise Prescription Changes):   Nutrition:  Target Goals: Understanding of nutrition guidelines, daily intake of sodium 1500mg , cholesterol 200mg , calories 30% from fat and 7% or less from saturated fats, daily to have 5 or more servings of fruits and vegetables.  Biometrics:  Pre Biometrics - 09/01/19 1503      Pre Biometrics   Grip Strength 36 kg            Nutrition Therapy Plan and Nutrition Goals:   Nutrition Assessments:   Nutrition Goals Re-Evaluation:   Nutrition Goals Discharge (Final Nutrition Goals Re-Evaluation):   Psychosocial: Target Goals: Acknowledge presence or absence of significant  depression and/or stress, maximize coping skills, provide positive support system. Participant is able to verbalize types and ability to use techniques and skills needed for reducing stress and depression.  Initial Review & Psychosocial Screening:  Initial Psych Review & Screening - 09/01/19 0903      Initial Review   Current issues with None Identified      Family Dynamics   Good Support System? Yes    Comments Pt recently married and moved to [place] for his retirement.  Pt wife works from home and she is a Landscape architect clients.   This has been very helpful for Shanon Brow = "she keeps me straight".      Barriers   Psychosocial barriers to participate in program The patient should benefit from training in stress management and relaxation.   every day stress     Screening Interventions   Interventions Encouraged to exercise           Quality of Life Scores:  Scores of 19 and below usually indicate a poorer quality of life in these areas.  A difference of  2-3 points is a clinically meaningful difference.  A difference of 2-3 points in the total score of the Quality of Life Index has been associated with significant improvement in overall quality of life, self-image, physical symptoms, and general health in studies assessing change in quality of life.  PHQ-9: Recent Review Flowsheet Data    Depression screen [facility] 2/9 09/01/2019 05/06/2019 09/18/2018   Decreased Interest 0 0 0   Down, Depressed, Hopeless 1 2  0   PHQ - 2 Score 1 2 0   Altered sleeping 3 2 3    Tired, decreased energy 1 2 0   Change in appetite 0 0 0   Feeling bad or failure about yourself  1 0 0   Trouble concentrating 0 0 0   Moving slowly or fidgety/restless 0 0 0   Suicidal thoughts 0 0 0   PHQ-9 Score 6 6 3    Difficult doing work/chores Not difficult at all Not difficult at all -     Interpretation of Total Score  Total Score Depression Severity:  1-4 = Minimal depression, 5-9 = Mild depression, 10-14 = Moderate depression, 15-19 = Moderately severe depression, 20-27 = Severe depression   Psychosocial Evaluation and Intervention:   Psychosocial Re-Evaluation:   Psychosocial Discharge (Final Psychosocial Re-Evaluation):   Education: Education Goals: Education classes will be provided on a weekly basis, covering required topics. Participant will state understanding/return demonstration of topics presented.  Learning Barriers/Preferences:  Learning Barriers/Preferences - 09/01/19 7416      Learning Barriers/Preferences   Learning  Barriers Sight    Learning Preferences Verbal Instruction;Written Material;Group Instruction;Individual Instruction           Education Topics: Risk Factor Reduction:  -Group instruction that is supported by a PowerPoint presentation. Instructor discusses the definition of a risk factor, different risk factors for pulmonary disease, and how the heart and lungs work together.     Nutrition for Pulmonary Patient:  -Group instruction provided by PowerPoint slides, verbal discussion, and written materials to support subject matter. The instructor gives an explanation and review of healthy diet recommendations, which includes a discussion on weight management, recommendations for fruit and vegetable consumption, as well as protein, fluid, caffeine, fiber, sodium, sugar, and alcohol. Tips for eating when patients are short of breath are discussed.   Pursed Lip Breathing:  -Group instruction that is supported by demonstration and informational handouts.  Instructor discusses the benefits of pursed lip and diaphragmatic breathing and detailed demonstration on how to preform both.     Oxygen Safety:  -Group instruction provided by PowerPoint, verbal discussion, and written material to support subject matter. There is an overview of "What is Oxygen" and "Why do we need it".  Instructor also reviews how to create a safe environment for oxygen use, the importance of using oxygen as prescribed, and the risks of noncompliance. There is a brief discussion on traveling with oxygen and resources the patient may utilize.   Oxygen Equipment:  -Group instruction provided by [facility] Staff utilizing handouts, written materials, and equipment demonstrations.   Signs and Symptoms:  -Group instruction provided by written material and verbal discussion to support subject matter. Warning signs and symptoms of infection, stroke, and heart attack are reviewed and when to call the physician/911 reinforced. Tips  for preventing the spread of infection discussed.   Advanced Directives:  -Group instruction provided by verbal instruction and written material to support subject matter. Instructor reviews Advanced Directive laws and proper instruction for filling out document.   Pulmonary Video:  -Group video education that reviews the importance of medication and oxygen compliance, exercise, good nutrition, pulmonary hygiene, and pursed lip and diaphragmatic breathing for the pulmonary patient.   Exercise for the Pulmonary Patient:  -Group instruction that is supported by a PowerPoint presentation. Instructor discusses benefits of exercise, core components of exercise, frequency, duration, and intensity of an exercise routine, importance of utilizing pulse oximetry during exercise, safety while exercising, and options of places to exercise outside of rehab.     Pulmonary Medications:  -Verbally interactive group education provided by instructor with focus on inhaled medications and proper administration.   Anatomy and Physiology of the Respiratory System and Intimacy:  -Group instruction provided by PowerPoint, verbal discussion, and written material to support subject matter. Instructor reviews respiratory cycle and anatomical components of the respiratory system and their functions. Instructor also reviews differences in obstructive and restrictive respiratory diseases with examples of each. Intimacy, Sex, and Sexuality differences are reviewed with a discussion on how relationships can change when diagnosed with pulmonary disease. Common sexual concerns are reviewed.   MD DAY -A group question and answer session with a medical doctor that allows participants to ask questions that relate to their pulmonary disease state.   OTHER EDUCATION -Group or individual verbal, written, or video instructions that support the educational goals of the pulmonary rehab program.   Holiday Eating Survival Tips:    -Group instruction provided by PowerPoint slides, verbal discussion, and written materials to support subject matter. The instructor gives patients tips, tricks, and techniques to help them not only survive but enjoy the holidays despite the onslaught of food that accompanies the holidays.   Knowledge Questionnaire Score:  Knowledge Questionnaire Score - 09/01/19 1459      Knowledge Questionnaire Score   Pre Score 15/18           Core Components/Risk Factors/Patient Goals at Admission:  Personal Goals and Risk Factors at Admission - 09/01/19 0908      Core Components/Risk Factors/Patient Goals on Admission   Improve shortness of breath with ADL's Yes    Intervention Provide education, individualized exercise plan and daily activity instruction to help decrease symptoms of SOB with activities of daily living.    Expected Outcomes Short Term: Improve cardiorespiratory fitness to achieve a reduction of symptoms when performing ADLs;Long Term: Be able to perform more ADLs without symptoms or  delay the onset of symptoms           Core Components/Risk Factors/Patient Goals Review:    Core Components/Risk Factors/Patient Goals at Discharge (Final Review):    ITP Comments:  ITP Comments    Row Name 09/01/19 0849           ITP Comments Dr. Rodman Pickle, Medical Director [facility]              Comments:

## 2019-09-01 NOTE — Progress Notes (Signed)
Allen [name] 69 y.o. male Pulmonary Rehab Orientation Note Allen [name] was referred to Pulmonary rehab by Dr. Halford Chessman with the diagnosis of Emphysema arrived today for Pulmonary Rehab. He arrived ambulatory with normal gait. Pt has chronic neck and back pain.  Pt is under the care of sports medicine.  He does not carry portable oxygen.  Color good, skin warm and dry. Patient is oriented to time and place. Patient's medical history, psychosocial health, and medications reviewed. Psychosocial assessment reveals pt lives with their spouse. Pt is currently retired. Pt hobbies include reading. Pt reports his  stress level is low - "normal everyday stress".  Pt  does not exhibit signs of depression.  PHQ2/9 score 1/6. Pt shows good  coping skills with positive outlook .  Will continue to monitor and evaluate progress toward psychosocial goal(s) of decreased shortness of breath. Physical assessment reveals heart rate is normal, breath sounds clear to auscultation, no wheezes, rales, or rhonchi, diminished. Grip strength equal, strong. Distal pulses palpable with trace swelling. Patient reports he does take medications as prescribed. Patient states he follows a Regular diet. The patient has been trying to lose weight through a healthy diet and exercise program. Pt has been successful with his weight following an Oat diet. Patient's weight will be monitored closely. Demonstration and practice of PLB using pulse oximeter. Patient able to return demonstration satisfactorily. Safety and hand hygiene in the exercise area reviewed with patient. Patient voices understanding of the information reviewed. Department expectations discussed with patient and achievable goals were set. The patient shows enthusiasm about attending the program and we look forward to working with this nice gentleman. The patient who is excited to will begin exercise on 8/24 at 9:45.  45 minutes was spent on a variety of activities such as assessment of the  patient, obtaining baseline data including height, weight, BMI, and grip strength, verifying medical history, allergies, and current medications, and teaching patient strategies for performing tasks with less respiratory effort with emphasis on pursed lip breathing. Cherre Huger, BSN Cardiac and Training and development officer

## 2019-09-07 ENCOUNTER — Telehealth: Payer: Self-pay | Admitting: Allergy & Immunology

## 2019-09-07 ENCOUNTER — Other Ambulatory Visit: Payer: Self-pay | Admitting: *Deleted

## 2019-09-07 DIAGNOSIS — R69 Illness, unspecified: Secondary | ICD-10-CM | POA: Diagnosis not present

## 2019-09-07 MED ORDER — OPTICHAMBER DIAMOND MISC: 0 refills | Status: DC

## 2019-09-07 NOTE — Telephone Encounter (Signed)
Patient is checking on the progress of a prior authorization for [facility] and he also needs a spacer, but was told by the pharmacy that he needs a prescription. [facility] [address].

## 2019-09-07 NOTE — Telephone Encounter (Signed)
PA has been submitted for Breztri through CoverMyMeds and is currently pending approval or denial. Called patient and advised that we will call him back with a response from the PA. Patient verbalized understanding and did confirm that he needed a spacer prescription sent to the pharmacy. A spacer prescription has been sent to the requested pharmacy. Patient verbalized understanding.

## 2019-09-07 NOTE — Telephone Encounter (Signed)
PA for Allen [name] has been approved. Approval form has been faxed to pharmacy, labeled, and placed in bulk scanning. Called patient and advised. Patient verbalized understanding.

## 2019-09-08 ENCOUNTER — Other Ambulatory Visit: Payer: Self-pay

## 2019-09-08 ENCOUNTER — Ambulatory Visit: Payer: Medicare HMO | Admitting: Podiatry

## 2019-09-08 ENCOUNTER — Encounter (HOSPITAL_COMMUNITY)
Admission: RE | Admit: 2019-09-08 | Discharge: 2019-09-08 | Disposition: A | Discharge status: Home / Self Care | Payer: Medicare HMO | Source: Ambulatory Visit | Attending: Pulmonary Disease | Admitting: Pulmonary Disease

## 2019-09-08 ENCOUNTER — Encounter: Payer: Self-pay | Admitting: Podiatry

## 2019-09-08 VITALS — Wt 259.7 lb

## 2019-09-08 DIAGNOSIS — B351 Tinea unguium: Secondary | ICD-10-CM | POA: Diagnosis not present

## 2019-09-08 DIAGNOSIS — J438 Other emphysema: Secondary | ICD-10-CM

## 2019-09-08 DIAGNOSIS — M79675 Pain in left toe(s): Secondary | ICD-10-CM

## 2019-09-08 DIAGNOSIS — M79674 Pain in right toe(s): Secondary | ICD-10-CM

## 2019-09-08 NOTE — Progress Notes (Signed)
Daily Session Note  Patient Details  Name: Allen [name] MRN: [id number] Date of Birth: [birth year] Referring Provider:     Pulmonary Rehab Walk Test from 09/01/2019 in [place]  Referring Provider Dr. Halford Chessman      Encounter Date: 09/08/2019  Check In:  Session Check In - 09/08/19 1108      Check-In   Supervising physician immediately available to respond to emergencies [facility] immediately available    Physician(s) Dr. Sherral Hammers    Location [facility]    Staff Present Maurice Small, RN, Bjorn Loser, MS, CEP, Exercise Physiologist;Lisa Jani Gravel, MS, ACSM-CEP, Exercise Physiologist    Virtual Visit No    Medication changes reported     No    Fall or balance concerns reported    No    Tobacco Cessation No Change    Warm-up and Cool-down Performed on first and last piece of equipment    Resistance Training Performed Yes    VAD Patient? No    PAD/SET Patient? No      Pain Assessment   Currently in Pain? No/denies    Pain Score 0-No pain    Multiple Pain Sites No           Capillary Blood Glucose: No results found for this or any previous visit (from the past 24 hour(s)).   Exercise Prescription Changes - 09/08/19 1100      Response to Exercise   Blood Pressure (Admit) 108/74    Blood Pressure (Exercise) 134/84    Blood Pressure (Exit) 138/80    Heart Rate (Admit) 84 bpm    Heart Rate (Exercise) 113 bpm    Heart Rate (Exit) 81 bpm    Oxygen Saturation (Admit) 96 %    Oxygen Saturation (Exercise) 96 %    Oxygen Saturation (Exit) 96 %    Rating of Perceived Exertion (Exercise) 11    Perceived Dyspnea (Exercise) 1    Duration Continue with 30 min of aerobic exercise without signs/symptoms of physical distress.    Intensity THRR unchanged      Progression   Progression Continue to progress workloads to maintain intensity without signs/symptoms of physical distress.      Resistance  Training   Training Prescription Yes    Weight blue bands    Reps 10-15    Time 10 Minutes      Treadmill   MPH 2    Grade 0    Minutes 15    METs 2.53      NuStep   Level 2    SPM 80    Minutes 15    METs 2.5           Social History   Tobacco Use  Smoking Status Former Smoker  . Packs/day: 3.00  . Years: 20.00  . Pack years: 60.00  . Types: Cigarettes  . Quit date: 01/15/1989  . Years since quitting: 30.6  Smokeless Tobacco Former Systems developer    Goals Met:  Independence with exercise equipment Exercise tolerated well No report of cardiac concerns or symptoms Strength training completed today  Goals Unmet:  Not Applicable  Comments: Service time is from 0945 to 1055    Dr. Fransico Him is Medical Director for Cardiac Rehab at [facility].

## 2019-09-08 NOTE — Progress Notes (Signed)
This patient returns to the office for evaluation and treatment of long thick painful nails .  This patient is unable to trim his own nails since the patient cannot reach his feet.  Patient says the nails are painful walking and wearing his shoes.  He returns for preventive foot care services. Patient had surgery by Dr.  March Rummage.  General Appearance  Alert, conversant and in no acute stress.  Vascular  Dorsalis pedis and posterior tibial  pulses are palpable  bilaterally.  Capillary return is within normal limits  bilaterally. Temperature is within normal limits  bilaterally.  Neurologic  Senn-Weinstein monofilament wire test within normal limits  bilaterally. Muscle power within normal limits bilaterally.  Nails Thick disfigured discolored nails with subungual debris  from hallux to fifth toes bilaterally. No evidence of bacterial infection or drainage bilaterally.  Orthopedic  No limitations of motion  feet .  No crepitus or effusions noted.  No bony pathology or digital deformities noted.  Skin  normotropic skin with no porokeratosis noted bilaterally.  No signs of infections or ulcers noted.     Onychomycosis  Pain in toes right foot  Pain in toes left foot  Debridement  of nails  1-5  B/L with a nail nipper.  Nails were then filed using a dremel tool with no incidents.    RTC 3 months    Gardiner Barefoot DPM

## 2019-09-10 ENCOUNTER — Encounter (HOSPITAL_COMMUNITY): Payer: Medicare HMO

## 2019-09-15 ENCOUNTER — Other Ambulatory Visit: Payer: Self-pay

## 2019-09-15 ENCOUNTER — Encounter (HOSPITAL_COMMUNITY)
Admission: RE | Admit: 2019-09-15 | Discharge: 2019-09-15 | Disposition: A | Discharge status: Home / Self Care | Payer: Medicare HMO | Source: Ambulatory Visit | Attending: Pulmonary Disease | Admitting: Pulmonary Disease

## 2019-09-15 VITALS — Wt 259.0 lb

## 2019-09-15 DIAGNOSIS — J438 Other emphysema: Secondary | ICD-10-CM | POA: Diagnosis not present

## 2019-09-15 NOTE — Progress Notes (Signed)
Daily Session Note  Patient Details  Name: Allen [name] MRN: [id number] Date of Birth: 11-26-1950 Referring Provider:     Pulmonary Rehab Walk Test from 09/01/2019 in [place]  Referring Provider Dr. Halford Chessman      Encounter Date: 09/15/2019  Check In:  Session Check In - 09/15/19 1037      Check-In   Supervising physician immediately available to respond to emergencies [facility] immediately available    Physician(s) Dr. Dessa Phi    Location [facility]    Staff Present Maurice Small, RN, Bjorn Loser, MS, CEP, Exercise Physiologist;Lisa Jani Gravel, MS, ACSM-CEP, Exercise Physiologist    Virtual Visit No    Medication changes reported     No    Fall or balance concerns reported    No    Tobacco Cessation No Change    Warm-up and Cool-down Performed on first and last piece of equipment    Resistance Training Performed Yes    VAD Patient? No    PAD/SET Patient? No      Pain Assessment   Currently in Pain? No/denies    Pain Score 0-No pain    Multiple Pain Sites No           Capillary Blood Glucose: No results found for this or any previous visit (from the past 24 hour(s)).    Social History   Tobacco Use  Smoking Status Former Smoker  . Packs/day: 3.00  . Years: 20.00  . Pack years: 60.00  . Types: Cigarettes  . Quit date: 01/15/1989  . Years since quitting: 30.6  Smokeless Tobacco Former Systems developer    Goals Met:  Proper associated with RPD/PD & O2 Sat Exercise tolerated well No report of cardiac concerns or symptoms Strength training completed today  Goals Unmet:  Not Applicable  Comments: Service time is from 0945 to 66    Dr. Fransico Him is Medical Director for Cardiac Rehab at [facility].

## 2019-09-15 NOTE — Progress Notes (Signed)
Allen [name] 69 y.o. male Nutrition Note   Visit Diagnosis: Other emphysema ([place])   Past Medical History:  Diagnosis Date   Asthma    Bilateral cataracts    COPD (chronic obstructive pulmonary disease) ([place])    ED (erectile dysfunction)    Essential tremor    with nerve stimulator   GERD (gastroesophageal reflux disease)    Insomnia    OSA treated with BiPAP    Recurrent upper respiratory infection (URI)    Right carpal tunnel syndrome      Medications reviewed.   Current Outpatient Medications:    albuterol (PROVENTIL) (2.5 MG/3ML) 0.083% nebulizer solution, Take 3 mLs (2.5 mg total) by nebulization every 6 (six) hours., Disp: 150 mL, Rfl: 3   azelastine (ASTELIN) 0.1 % nasal spray, Place 1-2 sprays into both nostrils 2 (two) times daily as needed for rhinitis., Disp: 30 mL, Rfl: 5   Azelastine-Fluticasone 137-50 MCG/ACT SUSP, Place 1 spray into both nostrils 2 (two) times daily as needed., Disp: 23 g, Rfl: 5   benzonatate (TESSALON) 100 MG capsule, TAKE 1 CAPSULE (100 MG TOTAL) BY MOUTH 2 (TWO) TIMES DAILY AS NEEDED FOR COUGH., Disp: 30 capsule, Rfl: 0   Budeson-Glycopyrrol-Formoterol (BREZTRI AEROSPHERE) 160-9-4.8 MCG/ACT AERO, Inhale 2 puffs into the lungs in the morning and at bedtime., Disp: 10.7 g, Rfl: 5   budesonide-formoterol (SYMBICORT) 160-4.5 MCG/ACT inhaler, 2 puffs via space twice daily, Disp: 1 Inhaler, Rfl: 5   chlorpheniramine-HYDROcodone (TUSSIONEX PENNKINETIC ER) 10-8 MG/5ML SUER, Take 5 mLs by mouth every 12 (twelve) hours as needed for cough., Disp: 140 mL, Rfl: 0   fluticasone (FLONASE) 50 MCG/ACT nasal spray, Place 2 sprays into both nostrils daily as needed for allergies or rhinitis., Disp: 16 g, Rfl: 5   furosemide (LASIX) 20 MG tablet, TAKE 1 TABLET (20 MG TOTAL) BY MOUTH DAILY AS NEEDED FOR FLUID., Disp: 90 tablet, Rfl: 1   gabapentin (NEURONTIN) 100 MG capsule, Take 2 capsules (200 mg total) by mouth at bedtime., Disp: 180  capsule, Rfl: 0   montelukast (SINGULAIR) 10 MG tablet, Take 1 tablet (10 mg total) by mouth at bedtime., Disp: 90 tablet, Rfl: 1   omeprazole (PRILOSEC) 20 MG capsule, Take 1 capsule (20 mg total) by mouth daily., Disp: 30 capsule, Rfl: 5   predniSONE (DELTASONE) 1 MG tablet, Take 1 mg by mouth daily with breakfast. , Disp: , Rfl:    primidone (MYSOLINE) 50 MG tablet, Take 50 mg by mouth daily., Disp: , Rfl:    Spacer/Aero-Holding Chambers ([place]) MISC, Please use as directed with Breztri inhaler., Disp: 1 each, Rfl: 0   traZODone (DESYREL) 100 MG tablet, Take 200 mg by mouth at bedtime., Disp: , Rfl:    Ht Readings from Last 1 Encounters:  09/01/19 5\' 10"  (1.778 m)     Wt Readings from Last 3 Encounters:  09/08/19 259 lb 11.2 oz (117.8 kg)  09/01/19 258 lb 9.6 oz (117.3 kg)  08/27/19 260 lb 3.2 oz (118 kg)     There is no height or weight on file to calculate BMI.   Social History   Tobacco Use  Smoking Status Former Smoker   Packs/day: 3.00   Years: 20.00   Pack years: 60.00   Types: Cigarettes   Quit date: 01/15/1989   Years since quitting: 30.6  Smokeless Tobacco Former User     Lab Results  Component Value Date   CHOL 171 05/06/2019   Lab Results  Component Value Date  HDL 62.90 05/06/2019   Lab Results  Component Value Date   LDLCALC 86 05/06/2019   Lab Results  Component Value Date   TRIG 107.0 05/06/2019     Lab Results  Component Value Date   HGBA1C 5.6 05/06/2019     CBG (last 3)  No results for input(s): GLUCAP in the last 72 hours.   Nutrition Note  Spoke with pt. Nutrition Plan and Nutrition Survey goals reviewed with pt. Pt is following a Heart Healthy diet. Pt reports making diet changes last week. Pt wants to lose wt. Pt has been trying to lose wt by eating smaller portions, choosing more veggies, eating fewer sandwiches and less potato/pasta salads (staples in his diet previously).  Diet history: nutrisystem,  jenny craig, weight watchers Weight History: weight cycling present since 69 years old. UBW 175 lbs. A year ago he was 205 lbs. In past year he has gained 50 lbs.   He also started working out with a Physiological scientist and going to the gym.    Per discussion, pt does not use canned/convenience foods often. Pt does not add salt to food. Pt does not eat out frequently.  He does read labels sometimes. He reports choosing low sodium foods when grocery shopping. Reviewed daily sodium recommendation.  Pt expressed understanding of the information reviewed.    Nutrition Diagnosis ? Obese  II = 35-39.9 related to excessive energy intake as evidenced by a BMI 37.16 kg/m2  Nutrition Intervention ? Pts individual nutrition plan reviewed with pt. ? Benefits of adopting Heart Healthy diet discussed when Medficts reviewed.   ? Continue client-centered nutrition education by RD, as part of interdisciplinary care.  Goal(s) ? Choose variety of foods to make diet pattern more sustainable ? Pt to identify food quantities necessary to achieve weight loss of 6-24 lb at graduation from pulmonary rehab.  ? Pt to stay active by incorporating 30 minutes of exercise 3-5 d/week  Plan:   Will provide client-centered nutrition education as part of interdisciplinary care  Monitor and evaluate progress toward nutrition goal with team.   Michaele Offer, MS, RDN, LDN

## 2019-09-17 ENCOUNTER — Encounter (HOSPITAL_COMMUNITY)
Admission: RE | Admit: 2019-09-17 | Discharge: 2019-09-17 | Disposition: A | Discharge status: Home / Self Care | Payer: Medicare HMO | Source: Ambulatory Visit | Attending: Pulmonary Disease | Admitting: Pulmonary Disease

## 2019-09-17 ENCOUNTER — Other Ambulatory Visit: Payer: Self-pay

## 2019-09-17 DIAGNOSIS — J438 Other emphysema: Secondary | ICD-10-CM | POA: Diagnosis not present

## 2019-09-17 NOTE — Progress Notes (Signed)
Daily Session Note  Patient Details  Name: Allen [name] MRN: [id number] Date of Birth: 1950/06/23 Referring Provider:     Pulmonary Rehab Walk Test from 09/01/2019 in [place]  Referring Provider Dr. Halford Chessman      Encounter Date: 09/17/2019  Check In:  Session Check In - 09/17/19 1252      Check-In   Supervising physician immediately available to respond to emergencies [facility] immediately available    Physician(s) Dr. Cathlean Sauer    [facility]    Staff Present Rodney Langton, RN;Dalton Kris Mouton, MS, CEP, Exercise Physiologist;Loys Makemson, MS, EP-C, CCRP    Virtual Visit No    Medication changes reported     No    Fall or balance concerns reported    No    Tobacco Cessation No Change    Warm-up and Cool-down Performed on first and last piece of equipment    Resistance Training Performed Yes    VAD Patient? No      Pain Assessment   Currently in Pain? No/denies    Multiple Pain Sites No           Capillary Blood Glucose: No results found for this or any previous visit (from the past 24 hour(s)).    Social History   Tobacco Use  Smoking Status Former Smoker   Packs/day: 3.00   Years: 20.00   Pack years: 60.00   Types: Cigarettes   Quit date: 01/15/1989   Years since quitting: 30.6  Smokeless Tobacco Former Systems developer    Goals Met:  Exercise tolerated well No report of cardiac concerns or symptoms Strength training completed today  Goals Unmet:  Not Applicable  Comments: Service time is from 0947 to 1045    Dr. Fransico Him is Medical Director for Cardiac Rehab at [facility].

## 2019-09-22 ENCOUNTER — Encounter (HOSPITAL_COMMUNITY)
Admission: RE | Admit: 2019-09-22 | Discharge: 2019-09-22 | Disposition: A | Discharge status: Home / Self Care | Payer: Medicare HMO | Source: Ambulatory Visit | Attending: Pulmonary Disease | Admitting: Pulmonary Disease

## 2019-09-22 ENCOUNTER — Other Ambulatory Visit: Payer: Self-pay | Admitting: Internal Medicine

## 2019-09-22 ENCOUNTER — Other Ambulatory Visit: Payer: Self-pay | Admitting: Family Medicine

## 2019-09-22 ENCOUNTER — Other Ambulatory Visit: Payer: Self-pay

## 2019-09-22 VITALS — Wt 259.9 lb

## 2019-09-22 DIAGNOSIS — E559 Vitamin D deficiency, unspecified: Secondary | ICD-10-CM

## 2019-09-22 DIAGNOSIS — R69 Illness, unspecified: Secondary | ICD-10-CM | POA: Diagnosis not present

## 2019-09-22 DIAGNOSIS — J438 Other emphysema: Secondary | ICD-10-CM

## 2019-09-22 NOTE — Progress Notes (Signed)
Daily Session Note  Patient Details  Name: Allen [name] MRN: [id number] Date of Birth: 10/15/1950 Referring Provider:     Pulmonary Rehab Walk Test from 09/01/2019 in [facility]  Referring Provider Dr. Sood      Encounter Date: 09/22/2019  Check In:  Session Check In - 09/22/19 1110      Check-In   Supervising physician immediately available to respond to emergencies [facility] immediately available    Physician(s) Dr. M Arrien    [facility]    Staff Present Carlette Carlton, RN, BSN;Lisa Hughes, RN;Naol Makemson, MS, EP-C, CCRP; , MS, ACSM-CEP, Exercise Physiologist    Virtual Visit No    Medication changes reported     No    Fall or balance concerns reported    No    Tobacco Cessation No Change    Warm-up and Cool-down Performed on first and last piece of equipment    Resistance Training Performed Yes    VAD Patient? No    PAD/SET Patient? No      Pain Assessment   Currently in Pain? No/denies    Pain Score 0-No pain    Multiple Pain Sites No           Capillary Blood Glucose: No results found for this or any previous visit (from the past 24 hour(s)).   Exercise Prescription Changes - 09/22/19 1400      Response to Exercise   Blood Pressure (Admit) 130/80    Blood Pressure (Exercise) 128/78    Blood Pressure (Exit) 128/80    Heart Rate (Admit) 86 bpm    Heart Rate (Exercise) 104 bpm    Heart Rate (Exit) 83 bpm    Oxygen Saturation (Admit) 95 %    Oxygen Saturation (Exercise) 95 %    Oxygen Saturation (Exit) 95 %    Rating of Perceived Exertion (Exercise) 11    Perceived Dyspnea (Exercise) 1    Duration Continue with 30 min of aerobic exercise without signs/symptoms of physical distress.    Intensity THRR unchanged      Progression   Progression Continue to progress workloads to maintain intensity without signs/symptoms of physical distress.    Average METs 2      Resistance  Training   Training Prescription Yes    Weight blue bands    Reps 10-15    Time 10 Minutes      Treadmill   MPH 2    Grade 0    Minutes 15    METs 2.5      NuStep   Level 2    SPM 80    Minutes 15    METs 2.3           Social History   Tobacco Use  Smoking Status Former Smoker  . Packs/day: 3.00  . Years: 20.00  . Pack years: 60.00  . Types: Cigarettes  . Quit date: 01/15/1989  . Years since quitting: 30.7  Smokeless Tobacco Former User    Goals Met:  Proper associated with RPD/PD & O2 Sat Exercise tolerated well No report of cardiac concerns or symptoms Strength training completed today  Goals Unmet:  Not Applicable  Comments: Service time is from 0945 to 1036    Dr. Traci Turner is Medical Director for Cardiac Rehab at [facility]. 

## 2019-09-23 ENCOUNTER — Encounter (HOSPITAL_COMMUNITY): Payer: Self-pay

## 2019-09-24 ENCOUNTER — Encounter (HOSPITAL_COMMUNITY)
Admission: RE | Admit: 2019-09-24 | Discharge: 2019-09-24 | Disposition: A | Discharge status: Home / Self Care | Payer: Medicare HMO | Source: Ambulatory Visit | Attending: Pulmonary Disease | Admitting: Pulmonary Disease

## 2019-09-24 ENCOUNTER — Other Ambulatory Visit: Payer: Self-pay

## 2019-09-24 DIAGNOSIS — J438 Other emphysema: Secondary | ICD-10-CM | POA: Diagnosis not present

## 2019-09-24 NOTE — Progress Notes (Signed)
Pulmonary Individual Treatment Plan  Patient Details  Name: Zared Knoth MRN: [id number] Date of Birth: [birth year] Referring Provider:     Pulmonary Rehab Walk Test from 09/01/2019 in [facility]  Referring Provider Dr. Halford Chessman      Initial Encounter Date:    Pulmonary Rehab Walk Test from 09/01/2019 in [facility]  Date 09/01/19      Visit Diagnosis: Other emphysema ([place])  Patient's Home Medications on Admission:   Current Outpatient Medications:  .  albuterol (PROVENTIL) (2.5 MG/3ML) 0.083% nebulizer solution, Take 3 mLs (2.5 mg total) by nebulization every 6 (six) hours., Disp: 150 mL, Rfl: 3 .  azelastine (ASTELIN) 0.1 % nasal spray, Place 1-2 sprays into both nostrils 2 (two) times daily as needed for rhinitis., Disp: 30 mL, Rfl: 5 .  Azelastine-Fluticasone 137-50 MCG/ACT SUSP, Place 1 spray into both nostrils 2 (two) times daily as needed., Disp: 23 g, Rfl: 5 .  benzonatate (TESSALON) 100 MG capsule, TAKE 1 CAPSULE (100 MG TOTAL) BY MOUTH 2 (TWO) TIMES DAILY AS NEEDED FOR COUGH., Disp: 30 capsule, Rfl: 0 .  Budeson-Glycopyrrol-Formoterol (BREZTRI AEROSPHERE) 160-9-4.8 MCG/ACT AERO, Inhale 2 puffs into the lungs in the morning and at bedtime., Disp: 10.7 g, Rfl: 5 .  budesonide-formoterol (SYMBICORT) 160-4.5 MCG/ACT inhaler, 2 puffs via space twice daily, Disp: 1 Inhaler, Rfl: 5 .  chlorpheniramine-HYDROcodone (TUSSIONEX PENNKINETIC ER) 10-8 MG/5ML SUER, Take 5 mLs by mouth every 12 (twelve) hours as needed for cough., Disp: 140 mL, Rfl: 0 .  fluticasone (FLONASE) 50 MCG/ACT nasal spray, Place 2 sprays into both nostrils daily as needed for allergies or rhinitis., Disp: 16 g, Rfl: 5 .  furosemide (LASIX) 20 MG tablet, TAKE 1 TABLET (20 MG TOTAL) BY MOUTH DAILY AS NEEDED FOR FLUID., Disp: 90 tablet, Rfl: 1 .  gabapentin (NEURONTIN) 100 MG capsule, TAKE 2 CAPSULES (200 MG TOTAL) BY MOUTH AT BEDTIME., Disp: 180 capsule, Rfl:  0 .  montelukast (SINGULAIR) 10 MG tablet, Take 1 tablet (10 mg total) by mouth at bedtime., Disp: 90 tablet, Rfl: 1 .  omeprazole (PRILOSEC) 20 MG capsule, Take 1 capsule (20 mg total) by mouth daily., Disp: 30 capsule, Rfl: 5 .  predniSONE (DELTASONE) 1 MG tablet, Take 1 mg by mouth daily with breakfast. , Disp: , Rfl:  .  primidone (MYSOLINE) 50 MG tablet, Take 50 mg by mouth daily., Disp: , Rfl:  .  Spacer/Aero-Holding Chambers (Elliott) MISC, Please use as directed with Breztri inhaler., Disp: 1 each, Rfl: 0 .  traZODone (DESYREL) 100 MG tablet, Take 200 mg by mouth at bedtime., Disp: , Rfl:   Past Medical History: Past Medical History:  Diagnosis Date  . Asthma   . Bilateral cataracts   . COPD (chronic obstructive pulmonary disease) ([place])   . ED (erectile dysfunction)   . Essential tremor    with nerve stimulator  . GERD (gastroesophageal reflux disease)   . Insomnia   . OSA treated with BiPAP   . Recurrent upper respiratory infection (URI)   . Right carpal tunnel syndrome     Tobacco Use: Social History   Tobacco Use  Smoking Status Former Smoker  . Packs/day: 3.00  . Years: 20.00  . Pack years: 60.00  . Types: Cigarettes  . Quit date: 01/15/1989  . 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[employer] and Pulmonary Rehab Latest  Ref Rng & Units 05/06/2019   Cholestrol 0 - 200 mg/dL 171   LDLCALC 0 - 99 mg/dL 86   HDL >39.00 mg/dL 62.90   Trlycerides 0 - 149 mg/dL 107.0   Hemoglobin A1c 4.6 - 6.5 % 5.6      Capillary Blood Glucose: No results found for: GLUCAP   Pulmonary Assessment Scores:  Pulmonary Assessment Scores    Row Name 09/01/19 1500         ADL UCSD   ADL Phase Entry     SOB Score total 32       CAT Score   CAT Score 13       mMRC Score   mMRC Score 2           UCSD: Self-administered rating of dyspnea associated with activities of daily living  (ADLs) 6-point scale (0 = "not at all" to 5 = "maximal or unable to do because of breathlessness")  Scoring Scores range from 0 to 120.  Minimally important difference is 5 units  CAT: CAT can identify the health impairment of COPD patients and is better correlated with disease progression.  CAT has a scoring range of zero to 40. The CAT score is classified into four groups of low (less than 10), medium (10 - 20), high (21-30) and very high (31-40) based on the impact level of disease on health status. A CAT score over 10 suggests significant symptoms.  A worsening CAT score could be explained by an exacerbation, poor medication adherence, poor inhaler technique, or progression of COPD or comorbid conditions.  CAT MCID is 2 points  mMRC: mMRC (Modified Medical Research Council) Dyspnea Scale is used to assess the degree of baseline functional disability in patients of respiratory disease due to dyspnea. No minimal important difference is established. A decrease in score of 1 point or greater is considered a positive change.   Pulmonary Function Assessment:   Exercise Target Goals: Exercise Program Goal: Individual exercise prescription set using results from initial 6 min walk test and THRR while considering  patient's activity barriers and safety.   Exercise Prescription Goal: Initial exercise prescription builds to 30-45 minutes a day of aerobic activity, 2-3 days per week.  Home exercise guidelines will be given to patient during program as part of exercise prescription that the participant will acknowledge.  Activity Barriers & Risk Stratification:  Activity Barriers & Cardiac Risk Stratification - 09/01/19 0903      Activity Barriers & Cardiac Risk Stratification   Activity Barriers Arthritis;Back Problems;Neck/Spine Problems;Shortness of Breath    Cardiac Risk Stratification Low           6 Minute Walk:  6 Minute Walk    Row Name 09/01/19 1505         6 Minute Walk    Distance 1615 feet     Walk Time 6 minutes     # of Rest Breaks 0     MPH 3.05     METS 3.09     RPE 12     Perceived Dyspnea  1     VO2 Peak 10.84     Symptoms No     Resting HR 75 bpm     Resting BP 128/82     Resting Oxygen Saturation  95 %     Exercise Oxygen Saturation  during 6 min walk 93 %     Max Ex. HR 108 bpm     Max Ex. BP 140/76  2 Minute Post BP 126/80       Interval HR   1 Minute HR 108     2 Minute HR 108     3 Minute HR 105     4 Minute HR 105     5 Minute HR 107     6 Minute HR 105     2 Minute Post HR 81     Interval Heart Rate? Yes       Interval Oxygen   Interval Oxygen? Yes     Baseline Oxygen Saturation % 95 %     1 Minute Oxygen Saturation % 93 %     1 Minute Liters of Oxygen 0 L     2 Minute Oxygen Saturation % 93 %     2 Minute Liters of Oxygen 0 L     3 Minute Oxygen Saturation % 94 %     3 Minute Liters of Oxygen 0 L     4 Minute Oxygen Saturation % 94 %     4 Minute Liters of Oxygen 0 L     5 Minute Oxygen Saturation % 95 %     5 Minute Liters of Oxygen 0 L     6 Minute Oxygen Saturation % 95 %     6 Minute Liters of Oxygen 0 L     2 Minute Post Oxygen Saturation % 97 %     2 Minute Post Liters of Oxygen 0 L            Oxygen Initial Assessment:  Oxygen Initial Assessment - 09/01/19 1504      Home Oxygen   Home Oxygen Device None    Sleep Oxygen Prescription None    Home Exercise Oxygen Prescription None    Home at Rest Exercise Oxygen Prescription None    Compliance with Home Oxygen Use Yes      Initial 6 min Walk   Oxygen Used None      Program Oxygen Prescription   Program Oxygen Prescription None      Intervention   Short Term Goals To learn and exhibit compliance with exercise, home and travel O2 prescription;To learn and understand importance of monitoring SPO2 with pulse oximeter and demonstrate accurate use of the pulse oximeter.;To learn and understand importance of maintaining oxygen saturations>88%;To learn  and demonstrate proper pursed lip breathing techniques or other breathing techniques.;To learn and demonstrate proper use of respiratory medications    Long  Term Goals Verbalizes importance of monitoring SPO2 with pulse oximeter and return demonstration;Exhibits compliance with exercise, home and travel O2 prescription;Maintenance of O2 saturations>88%;Exhibits proper breathing techniques, such as pursed lip breathing or other method taught during program session;Demonstrates proper use of MDI's;Compliance with respiratory medication           Oxygen Re-Evaluation:   Oxygen Discharge (Final Oxygen Re-Evaluation):   Initial Exercise Prescription:  Initial Exercise Prescription - 09/01/19 1600      Date of Initial Exercise RX and Referring Provider   Date 09/01/19    Referring Provider Dr. Craige Cotta    Expected Discharge Date 10/29/19      Treadmill   MPH 2    Grade 0    Minutes 15      NuStep   Level 2    SPM 80    Minutes 15      Prescription Details   Frequency (times per week) 2    Duration Progress to 30 minutes of continuous aerobic without  signs/symptoms of physical distress      Intensity   THRR 40-80% of Max Heartrate 60-121    Ratings of Perceived Exertion 11-13    Perceived Dyspnea 0-4      Progression   Progression Continue to progress workloads to maintain intensity without signs/symptoms of physical distress.      Resistance Training   Training Prescription Yes    Weight blue bands    Reps 10-15           Perform Capillary Blood Glucose checks as needed.  Exercise Prescription Changes:  Exercise Prescription Changes    Row Name 09/08/19 1100 09/22/19 1400           Response to Exercise   Blood Pressure (Admit) 108/74 130/80      Blood Pressure (Exercise) 134/84 128/78      Blood Pressure (Exit) 138/80 128/80      Heart Rate (Admit) 84 bpm 86 bpm      Heart Rate (Exercise) 113 bpm 104 bpm      Heart Rate (Exit) 81 bpm 83 bpm      Oxygen  Saturation (Admit) 96 % 95 %      Oxygen Saturation (Exercise) 96 % 95 %      Oxygen Saturation (Exit) 96 % 95 %      Rating of Perceived Exertion (Exercise) 11 11      Perceived Dyspnea (Exercise) 1 1      Duration Continue with 30 min of aerobic exercise without signs/symptoms of physical distress. Continue with 30 min of aerobic exercise without signs/symptoms of physical distress.      Intensity THRR unchanged THRR unchanged        Progression   Progression Continue to progress workloads to maintain intensity without signs/symptoms of physical distress. Continue to progress workloads to maintain intensity without signs/symptoms of physical distress.      Average METs -- 2        Resistance Training   Training Prescription Yes Yes      Weight blue bands blue bands      Reps 10-15 10-15      Time 10 Minutes 10 Minutes        Treadmill   MPH 2 2      Grade 0 0      Minutes 15 15      METs 2.53 2.5        NuStep   Level 2 2      SPM 80 80      Minutes 15 15      METs 2.5 2.3             Exercise Comments:  Exercise Comments    Row Name 09/08/19 1128           Exercise Comments Pt completed his first day of exercise in pulmonary rehab today. He was able to tolerate exercise well with no complaints.              Exercise Goals and Review:  Exercise Goals    Row Name 09/01/19 1648             Exercise Goals   Increase Physical Activity Yes       Intervention Provide advice, education, support and counseling about physical activity/exercise needs.;Develop an individualized exercise prescription for aerobic and resistive training based on initial evaluation findings, risk stratification, comorbidities and participant's personal goals.       Expected Outcomes Short Term: Attend rehab  on a regular basis to increase amount of physical activity.;Long Term: Add in home exercise to make exercise part of routine and to increase amount of physical activity.;Long Term:  Exercising regularly at least 3-5 days a week.       Increase Strength and Stamina Yes       Intervention Provide advice, education, support and counseling about physical activity/exercise needs.;Develop an individualized exercise prescription for aerobic and resistive training based on initial evaluation findings, risk stratification, comorbidities and participant's personal goals.       Expected Outcomes Short Term: Increase workloads from initial exercise prescription for resistance, speed, and METs.;Short Term: Perform resistance training exercises routinely during rehab and add in resistance training at home;Long Term: Improve cardiorespiratory fitness, muscular endurance and strength as measured by increased METs and functional capacity (6MWT)       Able to understand and use rate of perceived exertion (RPE) scale Yes       Intervention Provide education and explanation on how to use RPE scale       Expected Outcomes Short Term: Able to use RPE daily in rehab to express subjective intensity level;Long Term:  Able to use RPE to guide intensity level when exercising independently       Able to understand and use Dyspnea scale Yes       Intervention Provide education and explanation on how to use Dyspnea scale       Expected Outcomes Short Term: Able to use Dyspnea scale daily in rehab to express subjective sense of shortness of breath during exertion;Long Term: Able to use Dyspnea scale to guide intensity level when exercising independently       Knowledge and understanding of Target Heart Rate Range (THRR) Yes       Intervention Provide education and explanation of THRR including how the numbers were predicted and where they are located for reference       Expected Outcomes Short Term: Able to state/look up THRR;Long Term: Able to use THRR to govern intensity when exercising independently;Short Term: Able to use daily as guideline for intensity in rehab       Understanding of Exercise Prescription  Yes       Intervention Provide education, explanation, and written materials on patient's individual exercise prescription       Expected Outcomes Short Term: Able to explain program exercise prescription;Long Term: Able to explain home exercise prescription to exercise independently              Exercise Goals Re-Evaluation :   Discharge Exercise Prescription (Final Exercise Prescription Changes):  Exercise Prescription Changes - 09/22/19 1400      Response to Exercise   Blood Pressure (Admit) 130/80    Blood Pressure (Exercise) 128/78    Blood Pressure (Exit) 128/80    Heart Rate (Admit) 86 bpm    Heart Rate (Exercise) 104 bpm    Heart Rate (Exit) 83 bpm    Oxygen Saturation (Admit) 95 %    Oxygen Saturation (Exercise) 95 %    Oxygen Saturation (Exit) 95 %    Rating of Perceived Exertion (Exercise) 11    Perceived Dyspnea (Exercise) 1    Duration Continue with 30 min of aerobic exercise without signs/symptoms of physical distress.    Intensity THRR unchanged      Progression   Progression Continue to progress workloads to maintain intensity without signs/symptoms of physical distress.    Average METs 2      Resistance Training  Training Prescription Yes    Weight blue bands    Reps 10-15    Time 10 Minutes      Treadmill   MPH 2    Grade 0    Minutes 15    METs 2.5      NuStep   Level 2    SPM 80    Minutes 15    METs 2.3           Nutrition:  Target Goals: Understanding of nutrition guidelines, daily intake of sodium '1500mg'$ , cholesterol '200mg'$ , calories 30% from fat and 7% or less from saturated fats, daily to have 5 or more servings of fruits and vegetables.  Biometrics:  Pre Biometrics - 09/01/19 1503      Pre Biometrics   Grip Strength 36 kg            Nutrition Therapy Plan and Nutrition Goals:  Nutrition Therapy & Goals - 09/15/19 1126      Nutrition Therapy   Diet Low sodium      Personal Nutrition Goals   Nutrition Goal Choose  variety of foods to make diet pattern more sustainable    Personal Goal #2 Pt to identify food quantities necessary to achieve weight loss of 6-24 lb at graduation from pulmonary rehab.    Personal Goal #3 Pt to stay active by incorporating 30 minutes of exercise 3-5 d/week      Intervention Plan   Intervention Prescribe, educate and counsel regarding individualized specific dietary modifications aiming towards targeted core components such as weight, hypertension, lipid management, diabetes, heart failure and other comorbidities.    Expected Outcomes Short Term Goal: A plan has been developed with personal nutrition goals set during dietitian appointment.;Long Term Goal: Adherence to prescribed nutrition plan.           Nutrition Assessments:  Nutrition Assessments - 09/15/19 1127      Rate Your Plate Scores   Pre Score 58           Nutrition Goals Re-Evaluation:  Nutrition Goals Re-Evaluation    [place] Name 09/15/19 1126             Goals   Current Weight 259 lb (117.5 kg)              Nutrition Goals Discharge (Final Nutrition Goals Re-Evaluation):  Nutrition Goals Re-Evaluation - 09/15/19 1126      Goals   Current Weight 259 lb (117.5 kg)           Psychosocial: Target Goals: Acknowledge presence or absence of significant depression and/or stress, maximize coping skills, provide positive support system. Participant is able to verbalize types and ability to use techniques and skills needed for reducing stress and depression.  Initial Review & Psychosocial Screening:  Initial Psych Review & Screening - 09/01/19 0903      Initial Review   Current issues with None Identified      Family Dynamics   Good Support System? Yes    Comments Pt recently married and moved to [place] for his retirement.  Pt wife works from home and she is a Landscape architect clients.  This has been very helpful for Shanon Brow = "she keeps me straight".      Barriers   Psychosocial  barriers to participate in program The patient should benefit from training in stress management and relaxation.   every day stress     Screening Interventions   Interventions Encouraged to exercise  Quality of Life Scores:  Scores of 19 and below usually indicate a poorer quality of life in these areas.  A difference of  2-3 points is a clinically meaningful difference.  A difference of 2-3 points in the total score of the Quality of Life Index has been associated with significant improvement in overall quality of life, self-image, physical symptoms, and general health in studies assessing change in quality of life.  PHQ-9: Recent Review Flowsheet Data    Depression screen [facility] 2/9 09/01/2019 05/06/2019 09/18/2018   Decreased Interest 0 0 0   Down, Depressed, Hopeless 1 2  0   PHQ - 2 Score 1 2 0   Altered sleeping $RemoveBeforeDE'3 2 3   'ojbanohEAMRCIgc$ Tired, decreased energy 1 2 0   Change in appetite 0 0 0   Feeling bad or failure about yourself  1 0 0   Trouble concentrating 0 0 0   Moving slowly or fidgety/restless 0 0 0   Suicidal thoughts 0 0 0   PHQ-9 Score $RemoveBef'6 6 3   'tSCeeWiTzZ$ Difficult doing work/chores Not difficult at all Not difficult at all -     Interpretation of Total Score  Total Score Depression Severity:  1-4 = Minimal depression, 5-9 = Mild depression, 10-14 = Moderate depression, 15-19 = Moderately severe depression, 20-27 = Severe depression   Psychosocial Evaluation and Intervention:  Psychosocial Evaluation - 09/23/19 1623      Psychosocial Evaluation & Interventions   Comments Konrad has competed 4 exercise sessions.  Pt generally is happy go lucky and demonstrates positive and healthy coping skills.    Expected Outcomes Dezmen will continue to have no  Psychosocial barriers to participating in pulmonary rehab           Psychosocial Re-Evaluation:  Psychosocial Re-Evaluation    [place] Name 09/23/19 1529             Psychosocial Re-Evaluation   Current issues with None Identified        Comments Pt demonstrates posittive and healthy outlook on life.  Pt has supportive wife. who is a Education officer, museum.  Pt is happy that he is retired and newly married.       Expected Outcomes Javani will continue to deny any psychosocial barriers to participating in pulmonary rehab.       Interventions Stress management education;Encouraged to attend Pulmonary Rehabilitation for the exercise;Relaxation education       Continue Psychosocial Services  Follow up required by staff              Psychosocial Discharge (Final Psychosocial Re-Evaluation):  Psychosocial Re-Evaluation - 09/23/19 1529      Psychosocial Re-Evaluation   Current issues with None Identified    Comments Pt demonstrates posittive and healthy outlook on life.  Pt has supportive wife. who is a Education officer, museum.  Pt is happy that he is retired and newly married.    Expected Outcomes Liberato will continue to deny any psychosocial barriers to participating in pulmonary rehab.    Interventions Stress management education;Encouraged to attend Pulmonary Rehabilitation for the exercise;Relaxation education    Continue Psychosocial Services  Follow up required by staff           Education: Education Goals: Education classes will be provided on a weekly basis, covering required topics. Participant will state understanding/return demonstration of topics presented.  Learning Barriers/Preferences:  Learning Barriers/Preferences - 09/01/19 5188      Learning Barriers/Preferences   Learning Barriers Sight    Learning Preferences  Verbal Instruction;Written Material;Group Instruction;Individual Instruction           Education Topics: Risk Factor Reduction:  -Group instruction that is supported by a PowerPoint presentation. Instructor discusses the definition of a risk factor, different risk factors for pulmonary disease, and how the heart and lungs work together.     Nutrition for Pulmonary Patient:  -Group instruction provided by  PowerPoint slides, verbal discussion, and written materials to support subject matter. The instructor gives an explanation and review of healthy diet recommendations, which includes a discussion on weight management, recommendations for fruit and vegetable consumption, as well as protein, fluid, caffeine, fiber, sodium, sugar, and alcohol. Tips for eating when patients are short of breath are discussed.   Pursed Lip Breathing:  -Group instruction that is supported by demonstration and informational handouts. Instructor discusses the benefits of pursed lip and diaphragmatic breathing and detailed demonstration on how to preform both.     Oxygen Safety:  -Group instruction provided by PowerPoint, verbal discussion, and written material to support subject matter. There is an overview of "What is Oxygen" and "Why do we need it".  Instructor also reviews how to create a safe environment for oxygen use, the importance of using oxygen as prescribed, and the risks of noncompliance. There is a brief discussion on traveling with oxygen and resources the patient may utilize.   Oxygen Equipment:  -Group instruction provided by [facility] Staff utilizing handouts, written materials, and equipment demonstrations.   Signs and Symptoms:  -Group instruction provided by written material and verbal discussion to support subject matter. Warning signs and symptoms of infection, stroke, and heart attack are reviewed and when to call the physician/911 reinforced. Tips for preventing the spread of infection discussed.   Advanced Directives:  -Group instruction provided by verbal instruction and written material to support subject matter. Instructor reviews Advanced Directive laws and proper instruction for filling out document.   Pulmonary Video:  -Group video education that reviews the importance of medication and oxygen compliance, exercise, good nutrition, pulmonary hygiene, and pursed lip and diaphragmatic  breathing for the pulmonary patient.   Exercise for the Pulmonary Patient:  -Group instruction that is supported by a PowerPoint presentation. Instructor discusses benefits of exercise, core components of exercise, frequency, duration, and intensity of an exercise routine, importance of utilizing pulse oximetry during exercise, safety while exercising, and options of places to exercise outside of rehab.     Pulmonary Medications:  -Verbally interactive group education provided by instructor with focus on inhaled medications and proper administration.   Anatomy and Physiology of the Respiratory System and Intimacy:  -Group instruction provided by PowerPoint, verbal discussion, and written material to support subject matter. Instructor reviews respiratory cycle and anatomical components of the respiratory system and their functions. Instructor also reviews differences in obstructive and restrictive respiratory diseases with examples of each. Intimacy, Sex, and Sexuality differences are reviewed with a discussion on how relationships can change when diagnosed with pulmonary disease. Common sexual concerns are reviewed.   MD DAY -A group question and answer session with a medical doctor that allows participants to ask questions that relate to their pulmonary disease state.   OTHER EDUCATION -Group or individual verbal, written, or video instructions that support the educational goals of the pulmonary rehab program.   PULMONARY REHAB OTHER RESPIRATORY from 09/22/2019 in [place]  Date 09/22/19  Educator Handout  Instruction Review Code 2- Demonstrated Understanding      Holiday Eating Survival Tips:  -  Group instruction provided by PowerPoint slides, verbal discussion, and written materials to support subject matter. The instructor gives patients tips, tricks, and techniques to help them not only survive but enjoy the holidays despite the onslaught of food that  accompanies the holidays.   Knowledge Questionnaire Score:  Knowledge Questionnaire Score - 09/01/19 1459      Knowledge Questionnaire Score   Pre Score 15/18           Core Components/Risk Factors/Patient Goals at Admission:  Personal Goals and Risk Factors at Admission - 09/01/19 0908      Core Components/Risk Factors/Patient Goals on Admission   Improve shortness of breath with ADL's Yes    Intervention Provide education, individualized exercise plan and daily activity instruction to help decrease symptoms of SOB with activities of daily living.    Expected Outcomes Short Term: Improve cardiorespiratory fitness to achieve a reduction of symptoms when performing ADLs;Long Term: Be able to perform more ADLs without symptoms or delay the onset of symptoms           Core Components/Risk Factors/Patient Goals Review:   Goals and Risk Factor Review    Row Name 09/23/19 1531             Core Components/Risk Factors/Patient Goals Review   Personal Goals Review Improve shortness of breath with ADL's;Develop more efficient breathing techniques such as purse lipped breathing and diaphragmatic breathing and practicing self-pacing with activity.;Increase knowledge of respiratory medications and ability to use respiratory devices properly.       Review Avier has completed 4 exercise sessions in pulmonary rehab. Will work with pt to increase his SPM  presently MET level of 2.0 on the nustep which is weight based. Review PLB techniques with pt with  verbal cues.  Will begin to move pt from cues to independence.       Expected Outcomes See Admission Goals.              Core Components/Risk Factors/Patient Goals at Discharge (Final Review):   Goals and Risk Factor Review - 09/23/19 1531      Core Components/Risk Factors/Patient Goals Review   Personal Goals Review Improve shortness of breath with ADL's;Develop more efficient breathing techniques such as purse lipped breathing and  diaphragmatic breathing and practicing self-pacing with activity.;Increase knowledge of respiratory medications and ability to use respiratory devices properly.    Review Jearld has completed 4 exercise sessions in pulmonary rehab. Will work with pt to increase his SPM  presently MET level of 2.0 on the nustep which is weight based. Review PLB techniques with pt with  verbal cues.  Will begin to move pt from cues to independence.    Expected Outcomes See Admission Goals.           ITP Comments:  ITP Comments    Row Name 09/01/19 0849           ITP Comments Dr. Rodman Pickle, Medical Director [facility]              Comments:  Lochlin has completed 4 exercise session in Pulmonary rehab. Pt maintains good attendance and will engage in home exercise once instructed.  Pulmonary rehab staff will  continue to monitor and reassess progress toward goals during her participation in Pulmonary Rehab. Cherre Huger, BSN Cardiac and Training and development officer

## 2019-09-24 NOTE — Progress Notes (Signed)
Daily Session Note  Patient Details  Name: Allen [name] MRN: [id number] Date of Birth: 1950-08-23 Referring Provider:     Pulmonary Rehab Walk Test from 09/01/2019 in [place]  Referring Provider Dr. Halford Chessman      Encounter Date: 09/24/2019  Check In:  Session Check In - 09/24/19 1002      Check-In   Supervising physician immediately available to respond to emergencies [facility] immediately available    Physician(s) Dr. Lurline Del    Location [facility]    Staff Present Rodney Langton, RN;Amiri Riechers Hassell Done, MS, ACSM-CEP, Exercise Physiologist;Portia Rollene Rotunda, RN, BSN    Virtual Visit No    Medication changes reported     No    Fall or balance concerns reported    No    Tobacco Cessation No Change    Warm-up and Cool-down Performed on first and last piece of equipment    Resistance Training Performed Yes    PAD/SET Patient? No      Pain Assessment   Currently in Pain? No/denies    Pain Score 0-No pain    Multiple Pain Sites No           Capillary Blood Glucose: No results found for this or any previous visit (from the past 24 hour(s)).    Social History   Tobacco Use  Smoking Status Former Smoker  . Packs/day: 3.00  . Years: 20.00  . Pack years: 60.00  . Types: Cigarettes  . Quit date: 01/15/1989  . Years since quitting: 30.7  Smokeless Tobacco Former Systems developer    Goals Met:  Proper associated with RPD/PD & O2 Sat Independence with exercise equipment Exercise tolerated well No report of cardiac concerns or symptoms Strength training completed today  Goals Unmet:  Not Applicable  Comments: Service time is from [place] to [place]    Dr. Fransico Him is Medical Director for Cardiac Rehab at [facility].

## 2019-09-24 NOTE — Progress Notes (Signed)
I have reviewed a Home Exercise Prescription with Allen [name] . Allen [name] is currently exercising at home.  The patient was advised to walk and perform resistance training 3 days a week for 45 minutes.  Allen [name] and I discussed how to progress their exercise prescription.  The patient stated that their goals were to lose weight, about 50 lbs, and to be healthy enough to travel.  The patient stated that they understand the exercise prescription.  We reviewed exercise guidelines, target heart rate during exercise, RPE Scale, weather conditions, NTG use, endpoints for exercise, warmup and cool down.  Patient is encouraged to come to me with any questions. I will continue to follow up with the patient to assist them with progression and safety.

## 2019-09-28 ENCOUNTER — Encounter: Payer: Self-pay | Admitting: Internal Medicine

## 2019-09-29 ENCOUNTER — Other Ambulatory Visit: Payer: Self-pay

## 2019-09-29 ENCOUNTER — Encounter (HOSPITAL_COMMUNITY)
Admission: RE | Admit: 2019-09-29 | Discharge: 2019-09-29 | Disposition: A | Discharge status: Home / Self Care | Payer: Medicare HMO | Source: Ambulatory Visit | Attending: Pulmonary Disease | Admitting: Pulmonary Disease

## 2019-09-29 DIAGNOSIS — J438 Other emphysema: Secondary | ICD-10-CM | POA: Diagnosis not present

## 2019-09-29 NOTE — Progress Notes (Signed)
Daily Session Note  Patient Details  Name: Allen [name] MRN: [id number] Date of Birth: 1950/04/06 Referring Provider:     Pulmonary Rehab Walk Test from 09/01/2019 in [place]  Referring Provider Dr. Halford Chessman      Encounter Date: 09/29/2019  Check In:  Session Check In - 09/29/19 0958      Check-In   Supervising physician immediately available to respond to emergencies [facility] immediately available    Physician(s) Dr. Lurline Del    [facility]    Staff Present Hoy Register, MS, CEP, Exercise Physiologist;Lisa Ysidro Evert, RN;Carlette Wilber Oliphant, RN, Isaac Laud, MS, ACSM-CEP, Exercise Physiologist    Virtual Visit No    Medication changes reported     No    Fall or balance concerns reported    No    Tobacco Cessation No Change    Warm-up and Cool-down Performed on first and last piece of equipment    Resistance Training Performed Yes    VAD Patient? No    PAD/SET Patient? No      Pain Assessment   Currently in Pain? No/denies    Pain Score 0-No pain    Multiple Pain Sites No           Capillary Blood Glucose: No results found for this or any previous visit (from the past 24 hour(s)).    Social History   Tobacco Use  Smoking Status Former Smoker  . Packs/day: 3.00  . Years: 20.00  . Pack years: 60.00  . Types: Cigarettes  . Quit date: 01/15/1989  . Years since quitting: 30.7  Smokeless Tobacco Former Systems developer    Goals Met:  Proper associated with RPD/PD & O2 Sat Independence with exercise equipment Exercise tolerated well No report of cardiac concerns or symptoms Strength training completed today  Goals Unmet:  Not Applicable  Comments: Service time is from 0945 to 1035    Dr. Fransico Him is Medical Director for Cardiac Rehab at [facility].

## 2019-09-30 DIAGNOSIS — G4733 Obstructive sleep apnea (adult) (pediatric): Secondary | ICD-10-CM | POA: Diagnosis not present

## 2019-09-30 NOTE — Progress Notes (Deleted)
Assessment/Plan:    1.  Essential Tremor  ***Status post DBS surgery in [place] in 2016.  Patient does not think surgery helped, but after programming his DBS, it is clear that DBS is helping some and just needed some reprogramming.  Subjective:   Allen [name] was seen today in follow up for essential tremor.  My previous records were reviewed prior to todays visit. Pt denies falls.  Pt denies lightheadedness, near syncope.  No hallucinations.  Mood has been good.  Current prescribed movement disorder medications: ***Primidone, 50 mg at bedtime (started last visit)   PREVIOUS MEDICATIONS:  primidone, propranolol, gabapentin; ? beta-blocker candidate now because of asthma  ALLERGIES:  No Known Allergies  CURRENT MEDICATIONS:  Outpatient Encounter Medications as of 10/08/2019  Medication Sig  . albuterol (PROVENTIL) (2.5 MG/3ML) 0.083% nebulizer solution Take 3 mLs (2.5 mg total) by nebulization every 6 (six) hours.  [name] Kitchen azelastine (ASTELIN) 0.1 % nasal spray Place 1-2 sprays into both nostrils 2 (two) times daily as needed for rhinitis.  . Azelastine-Fluticasone 137-50 MCG/ACT SUSP Place 1 spray into both nostrils 2 (two) times daily as needed.  . benzonatate (TESSALON) 100 MG capsule TAKE 1 CAPSULE (100 MG TOTAL) BY MOUTH 2 (TWO) TIMES DAILY AS NEEDED FOR COUGH.  . Budeson-Glycopyrrol-Formoterol (BREZTRI AEROSPHERE) 160-9-4.8 MCG/ACT AERO Inhale 2 puffs into the lungs in the morning and at bedtime.  . budesonide-formoterol (SYMBICORT) 160-4.5 MCG/ACT inhaler 2 puffs via space twice daily  . chlorpheniramine-HYDROcodone (TUSSIONEX PENNKINETIC ER) 10-8 MG/5ML SUER Take 5 mLs by mouth every 12 (twelve) hours as needed for cough.  . fluticasone (FLONASE) 50 MCG/ACT nasal spray Place 2 sprays into both nostrils daily as needed for allergies or rhinitis.  . furosemide (LASIX) 20 MG tablet TAKE 1 TABLET (20 MG TOTAL) BY MOUTH DAILY AS NEEDED FOR FLUID.  [name] Kitchen gabapentin (NEURONTIN) 100 MG  capsule TAKE 2 CAPSULES (200 MG TOTAL) BY MOUTH AT BEDTIME.  . montelukast (SINGULAIR) 10 MG tablet Take 1 tablet (10 mg total) by mouth at bedtime.  [name] Kitchen omeprazole (PRILOSEC) 20 MG capsule Take 1 capsule (20 mg total) by mouth daily.  . predniSONE (DELTASONE) 1 MG tablet Take 1 mg by mouth daily with breakfast.   . primidone (MYSOLINE) 50 MG tablet Take 50 mg by mouth daily.  [name] Kitchen Spacer/Aero-Holding [facility] DIAMOND) MISC Please use as directed with Breztri inhaler.  . traZODone (DESYREL) 100 MG tablet Take 200 mg by mouth at bedtime.   No facility-administered encounter medications on file as of 10/08/2019.     Objective:    PHYSICAL EXAMINATION:    VITALS:  There were no vitals filed for this visit.  GEN:  The patient appears stated age and is in NAD. HEENT:  Normocephalic, atraumatic.  The mucous membranes are moist. The superficial temporal arteries are without ropiness or tenderness. CV:  RRR Lungs:  CTAB Neck/HEME:  There are no carotid bruits bilaterally.  Neurological examination:  Orientation: The patient is alert and oriented x3. Cranial nerves: There is good facial symmetry. The speech is fluent and clear. Soft palate rises symmetrically and there is no tongue deviation. Hearing is intact to conversational tone. Sensation: Sensation is intact to light touch throughout Motor: Strength is at least antigravity x4.  Movement examination: Tone: There is normal tone in the UE/LE Abnormal movements: *** Coordination:  There is *** decremation with RAM's, *** Gait and Station: The patient has *** difficulty arising out of a deep-seated chair without the use of the hands.  The patient's stride length is good I have reviewed and interpreted the following labs independently   Chemistry      Component Value Date/Time   NA 138 05/06/2019 1047   K 4.5 05/06/2019 1047   CL 104 05/06/2019 1047   CO2 28 05/06/2019 1047   BUN 20 05/06/2019 1047   CREATININE 0.98  05/06/2019 1047      Component Value Date/Time   CALCIUM 9.1 05/06/2019 1047   ALKPHOS 50 05/06/2019 1047   AST 19 05/06/2019 1047   ALT 18 05/06/2019 1047   BILITOT 0.5 05/06/2019 1047      Lab Results  Component Value Date   WBC 6.5 05/06/2019   HGB 13.2 05/06/2019   HCT 39.9 05/06/2019   MCV 95.0 05/06/2019   PLT 304.0 05/06/2019   Lab Results  Component Value Date   TSH 1.54 05/06/2019     Chemistry      Component Value Date/Time   NA 138 05/06/2019 1047   K 4.5 05/06/2019 1047   CL 104 05/06/2019 1047   CO2 28 05/06/2019 1047   BUN 20 05/06/2019 1047   CREATININE 0.98 05/06/2019 1047      Component Value Date/Time   CALCIUM 9.1 05/06/2019 1047   ALKPHOS 50 05/06/2019 1047   AST 19 05/06/2019 1047   ALT 18 05/06/2019 1047   BILITOT 0.5 05/06/2019 1047         Total time spent on today's visit was ***30 minutes, including both face-to-face time and nonface-to-face time.  Time included that spent on review of records (prior notes available to me/labs/imaging if pertinent), discussing treatment and goals, answering patient's questions and coordinating care.  Cc:  Isaac Bliss, Rayford Halsted, MD

## 2019-10-01 ENCOUNTER — Other Ambulatory Visit: Payer: Self-pay

## 2019-10-01 ENCOUNTER — Encounter (HOSPITAL_COMMUNITY)
Admission: RE | Admit: 2019-10-01 | Discharge: 2019-10-01 | Disposition: A | Discharge status: Home / Self Care | Payer: Medicare HMO | Source: Ambulatory Visit | Attending: Pulmonary Disease | Admitting: Pulmonary Disease

## 2019-10-01 DIAGNOSIS — J438 Other emphysema: Secondary | ICD-10-CM | POA: Diagnosis not present

## 2019-10-01 NOTE — Progress Notes (Signed)
Daily Session Note  Patient Details  Name: Allen [name] MRN: [id number] Date of Birth: 03-25-1950 Referring Provider:     Pulmonary Rehab Walk Test from 09/01/2019 in [place]  Referring Provider Dr. Halford Chessman      Encounter Date: 10/01/2019  Check In:  Session Check In - 10/01/19 0952      Check-In   Supervising physician immediately available to respond to emergencies [facility] immediately available    Physician(s) Dr. Teryl Lucy    [facility]    Staff Present Maurice Small, RN, BSN;Lisa Ysidro Evert, RN;Lovelee Forner Hassell Done, MS, ACSM-CEP, Exercise Physiologist    Virtual Visit No    Medication changes reported     No    Fall or balance concerns reported    No    Tobacco Cessation No Change    Warm-up and Cool-down Performed on first and last piece of equipment    Resistance Training Performed Yes    VAD Patient? No    PAD/SET Patient? No      Pain Assessment   Currently in Pain? No/denies    Pain Score 0-No pain    Multiple Pain Sites No           Capillary Blood Glucose: No results found for this or any previous visit (from the past 24 hour(s)).    Social History   Tobacco Use  Smoking Status Former Smoker  . Packs/day: 3.00  . Years: 20.00  . Pack years: 60.00  . Types: Cigarettes  . Quit date: 01/15/1989  . Years since quitting: 30.7  Smokeless Tobacco Former Systems developer    Goals Met:  Proper associated with RPD/PD & O2 Sat Independence with exercise equipment Exercise tolerated well No report of cardiac concerns or symptoms Strength training completed today  Goals Unmet:  Not Applicable  Comments: Service time is from 0945 to 1036    Dr. Fransico Him is Medical Director for Cardiac Rehab at [facility].

## 2019-10-06 ENCOUNTER — Encounter (HOSPITAL_COMMUNITY)
Admission: RE | Admit: 2019-10-06 | Discharge: 2019-10-06 | Disposition: A | Discharge status: Home / Self Care | Payer: Medicare HMO | Source: Ambulatory Visit | Attending: Pulmonary Disease | Admitting: Pulmonary Disease

## 2019-10-06 ENCOUNTER — Other Ambulatory Visit: Payer: Self-pay

## 2019-10-06 VITALS — Wt 259.5 lb

## 2019-10-06 DIAGNOSIS — J438 Other emphysema: Secondary | ICD-10-CM | POA: Diagnosis not present

## 2019-10-06 NOTE — Progress Notes (Signed)
Daily Session Note  Patient Details  Name: Allen [name] MRN: [id number] Date of Birth: 22-May-1950 Referring Provider:     Pulmonary Rehab Walk Test from 09/01/2019 in [place]  Referring Provider Dr. Halford Chessman      Encounter Date: 10/06/2019  Check In:  Session Check In - 10/06/19 1056      Check-In   Supervising physician immediately available to respond to emergencies [facility] immediately available    Physician(s) Dr. Lonny Prude    [facility]    Staff Present Maurice Small, RN, Bjorn Loser, MS, CEP, Exercise Physiologist;Denene Alamillo Jani Gravel, MS, ACSM-CEP, Exercise Physiologist    Virtual Visit No    Medication changes reported     No    Fall or balance concerns reported    No    Tobacco Cessation No Change    Warm-up and Cool-down Performed on first and last piece of equipment    Resistance Training Performed Yes    VAD Patient? No    PAD/SET Patient? No      Pain Assessment   Currently in Pain? No/denies    Pain Score 0-No pain    Multiple Pain Sites No           Capillary Blood Glucose: No results found for this or any previous visit (from the past 24 hour(s)).   Exercise Prescription Changes - 10/06/19 1200      Response to Exercise   Blood Pressure (Admit) 132/78    Blood Pressure (Exercise) 110/64    Blood Pressure (Exit) 126/68    Heart Rate (Admit) 83 bpm    Heart Rate (Exercise) 117 bpm    Heart Rate (Exit) 80 bpm    Oxygen Saturation (Admit) 96 %    Oxygen Saturation (Exercise) 95 %    Oxygen Saturation (Exit) 96 %    Rating of Perceived Exertion (Exercise) 11    Perceived Dyspnea (Exercise) 1.5    Duration Continue with 30 min of aerobic exercise without signs/symptoms of physical distress.    Intensity THRR unchanged      Progression   Progression Continue to progress workloads to maintain intensity without signs/symptoms of physical distress.      Resistance  Training   Training Prescription Yes    Weight blue bands    Reps 10-15    Time 10 Minutes      Treadmill   MPH 2.5    Grade 0    Minutes 15      NuStep   Level 3    SPM 80    Minutes 15    METs 2           Social History   Tobacco Use  Smoking Status Former Smoker  . Packs/day: 3.00  . Years: 20.00  . Pack years: 60.00  . Types: Cigarettes  . Quit date: 01/15/1989  . Years since quitting: 30.7  Smokeless Tobacco Former Systems developer    Goals Met:  Exercise tolerated well No report of cardiac concerns or symptoms Strength training completed today  Goals Unmet:  Not Applicable  Comments: Service time is from 0945 to 1040    Dr. Fransico Him is Medical Director for Cardiac Rehab at [facility].

## 2019-10-07 ENCOUNTER — Other Ambulatory Visit: Payer: Self-pay

## 2019-10-07 ENCOUNTER — Encounter: Payer: Self-pay | Admitting: Family Medicine

## 2019-10-07 ENCOUNTER — Ambulatory Visit: Payer: Medicare HMO | Admitting: Family Medicine

## 2019-10-07 VITALS — BP 140/80 | HR 64 | Ht 70.0 in | Wt 260.0 lb

## 2019-10-07 DIAGNOSIS — M5136 Other intervertebral disc degeneration, lumbar region: Secondary | ICD-10-CM | POA: Diagnosis not present

## 2019-10-07 DIAGNOSIS — M5030 Other cervical disc degeneration, unspecified cervical region: Secondary | ICD-10-CM | POA: Diagnosis not present

## 2019-10-07 DIAGNOSIS — M999 Biomechanical lesion, unspecified: Secondary | ICD-10-CM | POA: Diagnosis not present

## 2019-10-07 NOTE — Assessment & Plan Note (Signed)
Tightness left side of the neck.  Discussed the tennis ball and manual massage.I think will be beneficial.  Increase activity slowly.  Follow-up again in 4 to 8 weeks

## 2019-10-07 NOTE — Assessment & Plan Note (Signed)
Significant degenerative disc disease but is responding fairly well to conservative therapy.  Chronic problem with mild exacerbation.  Gabapentin we discussed.  Discussed home exercise and icing regimen, discussed which activities to doing which wants to avoid.  Increase activity slowly over the course the next several weeks.  Follow-up again in 4 to 8 weeks

## 2019-10-07 NOTE — Patient Instructions (Addendum)
Good to see you Enjoy [place] 2 tennis ball in a sock and lay on them where head meets neck for 5 mins Ok to try towel with sleeping See me again in 6-8 weeks

## 2019-10-07 NOTE — Progress Notes (Signed)
[place] [address] [place] Phone: [phone] Subjective:   I Allen [name] am serving as a Education administrator for Dr. Hulan Saas.  This visit occurred during the SARS-CoV-2 public health emergency.  Safety protocols were in place, including screening questions prior to the visit, additional usage of staff PPE, and extensive cleaning of exam room while observing appropriate contact time as indicated for disinfecting solutions.   I'm seeing this patient by the request  of:  Isaac Bliss, Rayford Halsted, MD   CC: Low back pain follow-up  JOA:[id number]  Allen [name] is a 69 y.o. male coming in with complaint of back and neck pain. OMT 08/25/2019.  Patient states he is still sore. Saturday had his boaster covid injection and states his arm is sore.   Medications patient has been prescribed: Gabapentin Yes intermittently taking it         Reviewed prior external information including notes and imaging from previsou exam, outside providers and external EMR if available.   As well as notes that were available from care everywhere and other healthcare systems.  Past medical history, social, surgical and family history all reviewed in electronic medical record.  No pertanent information unless stated regarding to the chief complaint.   Past Medical History:  Diagnosis Date  . Asthma   . Bilateral cataracts   . COPD (chronic obstructive pulmonary disease) ([place])   . ED (erectile dysfunction)   . Essential tremor    with nerve stimulator  . GERD (gastroesophageal reflux disease)   . Insomnia   . OSA treated with BiPAP   . Recurrent upper respiratory infection (URI)   . Right carpal tunnel syndrome     No Known Allergies   Review of Systems:  No headache, visual changes, nausea, vomiting, diarrhea, constipation, dizziness, abdominal pain, skin rash, fevers, chills, night sweats, weight loss, swollen lymph nodes, body aches, joint  swelling, chest pain, shortness of breath, mood changes. POSITIVE muscle aches  Objective  Blood pressure 140/80, pulse 64, height 5\' 10"  (1.778 m), weight 260 lb (117.9 kg), SpO2 96 %.   General: No apparent distress alert and oriented x3 mood and affect normal, dressed appropriately.  HEENT: Pupils equal, extraocular movements intact  Respiratory: Patient's speak in full sentences and does not appear short of breath  Cardiovascular: No lower extremity edema, non tender, no erythema  Neuro: Cranial nerves II through XII are intact, neurovascularly intact in all extremities with 2+ DTRs and 2+ pulses.  Gait normal with good balance and coordination.  MSK:  Non tender with full range of motion and good stability and symmetric strength and tone of shoulders, elbows, wrist, hip, knee and ankles bilaterally.  Back -low back exam does have some tenderness to palpation in the paraspinal musculature of the lumbar spine right greater than left.  Seems to be more of the sacroiliac joint.  Neck exam does have some mild loss of lordosis.  Patient does have tightness noted.  Decreased sidebending bilaterally but negative Spurling's.  Osteopathic findings  C7 flexed rotated and side bent left T9 extended rotated and side bent left L2 flexed rotated and side bent right Sacrum right on right       Assessment and Plan: Degenerative disc disease, lumbar Significant degenerative disc disease but is responding fairly well to conservative therapy.  Chronic problem with mild exacerbation.  Gabapentin we discussed.  Discussed home exercise and icing regimen, discussed which activities to doing which wants to avoid.  Increase activity slowly over the course the next several weeks.  Follow-up again in 4 to 8 weeks  Degenerative disc disease, cervical Tightness left side of the neck.  Discussed the tennis ball and manual massage.I think will be beneficial.  Increase activity slowly.  Follow-up again in 4 to 8  weeks    Nonallopathic problems  Decision today to treat with OMT was based on Physical Exam  After verbal consent patient was treated with HVLA, ME, FPR techniques in cervical, , thoracic, lumbar, and sacral  areas  Patient tolerated the procedure well with improvement in symptoms  Patient given exercises, stretches and lifestyle modifications  See medications in patient instructions if given  Patient will follow up in 4-8 weeks      The above documentation has been reviewed and is accurate and complete Lyndal Pulley, DO       Note: This dictation was prepared with Dragon dictation along with smaller phrase technology. Any transcriptional errors that result from this process are unintentional.

## 2019-10-08 ENCOUNTER — Encounter: Payer: Medicare HMO | Admitting: Neurology

## 2019-10-08 ENCOUNTER — Encounter (HOSPITAL_COMMUNITY)
Admission: RE | Admit: 2019-10-08 | Discharge: 2019-10-08 | Disposition: A | Discharge status: Home / Self Care | Payer: Medicare HMO | Source: Ambulatory Visit | Attending: Pulmonary Disease | Admitting: Pulmonary Disease

## 2019-10-08 DIAGNOSIS — J438 Other emphysema: Secondary | ICD-10-CM | POA: Diagnosis not present

## 2019-10-08 NOTE — Progress Notes (Signed)
Daily Session Note  Patient Details  Name: Allen [name] MRN: [id number] Date of Birth: 06/14/1950 Referring Provider:     Pulmonary Rehab Walk Test from 09/01/2019 in [place]  Referring Provider Dr. Halford Chessman      Encounter Date: 10/08/2019  Check In:  Session Check In - 10/08/19 1137      Check-In   Supervising physician immediately available to respond to emergencies [facility] immediately available    Physician(s) Dr. Tyler Pita    Location [facility]    Staff Present Maurice Small, RN, Isaac Laud, MS, ACSM-CEP, Exercise Physiologist;Annedrea Rosezella [place], RN, MHA;Zyiah Withington Ysidro Evert, RN    Virtual Visit No    Medication changes reported     No    Fall or balance concerns reported    No    Tobacco Cessation No Change    Warm-up and Cool-down Performed on first and last piece of equipment    Resistance Training Performed Yes    VAD Patient? No    PAD/SET Patient? No      Pain Assessment   Currently in Pain? No/denies    Pain Score 0-No pain    Multiple Pain Sites No           Capillary Blood Glucose: No results found for this or any previous visit (from the past 24 hour(s)).    Social History   Tobacco Use  Smoking Status Former Smoker   Packs/day: 3.00   Years: 20.00   Pack years: 60.00   Types: Cigarettes   Quit date: 01/15/1989   Years since quitting: 30.7  Smokeless Tobacco Former Systems developer    Goals Met:  Exercise tolerated well No report of cardiac concerns or symptoms Strength training completed today  Goals Unmet:  Not Applicable  Comments: Service time is from Page to 1035    Dr. Fransico Him is Medical Director for Cardiac Rehab at [facility].

## 2019-10-13 ENCOUNTER — Encounter (HOSPITAL_COMMUNITY)
Admission: RE | Admit: 2019-10-13 | Discharge: 2019-10-13 | Disposition: A | Discharge status: Home / Self Care | Payer: Medicare HMO | Source: Ambulatory Visit | Attending: Pulmonary Disease | Admitting: Pulmonary Disease

## 2019-10-13 ENCOUNTER — Ambulatory Visit: Payer: Medicare HMO | Admitting: Podiatry

## 2019-10-13 ENCOUNTER — Other Ambulatory Visit: Payer: Self-pay

## 2019-10-13 DIAGNOSIS — M25571 Pain in right ankle and joints of right foot: Secondary | ICD-10-CM

## 2019-10-13 DIAGNOSIS — M25471 Effusion, right ankle: Secondary | ICD-10-CM | POA: Diagnosis not present

## 2019-10-13 DIAGNOSIS — J438 Other emphysema: Secondary | ICD-10-CM | POA: Diagnosis not present

## 2019-10-13 DIAGNOSIS — M25371 Other instability, right ankle: Secondary | ICD-10-CM

## 2019-10-13 MED ORDER — METHYLPREDNISOLONE 4 MG PO TBPK: ORAL_TABLET | ORAL | 0 refills | Status: DC

## 2019-10-13 NOTE — Progress Notes (Signed)
Daily Session Note  Patient Details  Name: Allen [name] MRN: [id number] Date of Birth: [birth year] Referring Provider:     Pulmonary Rehab Walk Test from 09/01/2019 in [place]  Referring Provider Dr. Halford Chessman      Encounter Date: 10/13/2019  Check In:  Session Check In - 10/13/19 0957      Check-In   Supervising physician immediately available to respond to emergencies [facility] immediately available    Physician(s) Dr. Darliss Cheney    Location [facility]    Staff Present Maurice Small, RN, Bjorn Loser, MS, CEP, Exercise Physiologist;Jessica Hassell Done, MS, ACSM-CEP, Exercise Physiologist    Virtual Visit No    Medication changes reported     No    Fall or balance concerns reported    No    Tobacco Cessation No Change    Warm-up and Cool-down Performed on first and last piece of equipment    Resistance Training Performed Yes    VAD Patient? No    PAD/SET Patient? No      Pain Assessment   Currently in Pain? No/denies    Pain Score 0-No pain    Multiple Pain Sites No           Capillary Blood Glucose: No results found for this or any previous visit (from the past 24 hour(s)).    Social History   Tobacco Use  Smoking Status Former Smoker  . Packs/day: 3.00  . Years: 20.00  . Pack years: 60.00  . Types: Cigarettes  . Quit date: 01/15/1989  . Years since quitting: 30.7  Smokeless Tobacco Former Systems developer    Goals Met:  Independence with exercise equipment Exercise tolerated well No report of cardiac concerns or symptoms Strength training completed today  Goals Unmet:  Not Applicable  Comments: Service time is from 0945 to 1030    Dr. Fransico Him is Medical Director for Cardiac Rehab at [facility].

## 2019-10-13 NOTE — Patient Instructions (Signed)
Ankle Instability Exercises: Level 1: Wearing shoes Level 2: Barefoot Level 3: Wearing shoes, eyes closed Level 4: Barefoot, eyes closed  Make sure you have a chair or wall in front of you for support at all times. You do not wear your brace while you do these exercises. Do these exercises twice a day, hold for 20 seconds. Repeat 3 times.Start with Level 1 the first week, then Level 2 the next if you feel comfortable, etc.

## 2019-10-14 NOTE — Progress Notes (Signed)
  Subjective:  Patient ID: Allen [name], male    DOB: 28-Jun-1950,  MRN: [id number]  Chief Complaint  Patient presents with  . Ankle Pain    Pt states burning sensation at lateral ankle 2 month duration off and on, history of surgery in this area 1 year ago.    69 y.o. male presents with the above complaint. History confirmed with patient.   Objective:  Physical Exam: warm, good capillary refill, no trophic changes or ulcerative lesions, normal DP and PT pulses and normal sensory exam. Right Foot: mild pain at the peroneal tendons, maximal pain at the ATFL. Negative anterior drawer, talar tilt.   Assessment:   1. Ankle instability, right   2. Pain and swelling of right ankle      Plan:  Patient was evaluated and treated and all questions answered.  Ankle instability right -Educated on etiology -Dispensed Trilock brace -4 week at home exercise regimen for balance and proprioception exercises.   Return in about 6 weeks (around 11/24/2019) for Ankle instability right.

## 2019-10-15 ENCOUNTER — Other Ambulatory Visit: Payer: Self-pay

## 2019-10-15 ENCOUNTER — Encounter (HOSPITAL_COMMUNITY)
Admission: RE | Admit: 2019-10-15 | Discharge: 2019-10-15 | Disposition: A | Discharge status: Home / Self Care | Payer: Medicare HMO | Source: Ambulatory Visit | Attending: Pulmonary Disease | Admitting: Pulmonary Disease

## 2019-10-15 DIAGNOSIS — J438 Other emphysema: Secondary | ICD-10-CM

## 2019-10-15 NOTE — Progress Notes (Signed)
Daily Session Note  Patient Details  Name: Allen [name] MRN: [id number] Date of Birth: 02/11/1950 Referring Provider:     Pulmonary Rehab Walk Test from 09/01/2019 in [place]  Referring Provider Dr. Halford Chessman      Encounter Date: 10/15/2019  Check In:  Session Check In - 10/15/19 1022      Check-In   Supervising physician immediately available to respond to emergencies [facility] immediately available    Physician(s) Dr. Darliss Cheney    [facility]    Staff Present Maurice Small, RN, Bjorn Loser, MS, CEP, Exercise Physiologist;Jessica Hassell Done, MS, ACSM-CEP, Exercise Physiologist    Virtual Visit No    Medication changes reported     No    Fall or balance concerns reported    No    Tobacco Cessation No Change    Warm-up and Cool-down Performed on first and last piece of equipment    Resistance Training Performed Yes    VAD Patient? No    PAD/SET Patient? No      Pain Assessment   Currently in Pain? No/denies    Pain Score 0-No pain    Multiple Pain Sites No           Capillary Blood Glucose: No results found for this or any previous visit (from the past 24 hour(s)).    Social History   Tobacco Use  Smoking Status Former Smoker  . Packs/day: 3.00  . Years: 20.00  . Pack years: 60.00  . Types: Cigarettes  . Quit date: 01/15/1989  . Years since quitting: 30.7  Smokeless Tobacco Former Systems developer    Goals Met:  Independence with exercise equipment Exercise tolerated well No report of cardiac concerns or symptoms Strength training completed today  Goals Unmet:  Not Applicable  Comments: Service time is from 0945 to 1030    Dr. Fransico Him is Medical Director for Cardiac Rehab at [facility].

## 2019-10-20 ENCOUNTER — Encounter (HOSPITAL_COMMUNITY): Payer: Medicare HMO

## 2019-10-22 ENCOUNTER — Encounter (HOSPITAL_COMMUNITY): Payer: Medicare HMO

## 2019-10-25 ENCOUNTER — Encounter: Payer: Self-pay | Admitting: Allergy & Immunology

## 2019-10-25 DIAGNOSIS — Z20822 Contact with and (suspected) exposure to covid-19: Secondary | ICD-10-CM | POA: Diagnosis not present

## 2019-10-26 DIAGNOSIS — R69 Illness, unspecified: Secondary | ICD-10-CM | POA: Diagnosis not present

## 2019-10-27 ENCOUNTER — Encounter (HOSPITAL_COMMUNITY)
Admission: RE | Admit: 2019-10-27 | Discharge: 2019-10-27 | Disposition: A | Discharge status: Home / Self Care | Payer: Medicare HMO | Source: Ambulatory Visit | Attending: Pulmonary Disease | Admitting: Pulmonary Disease

## 2019-10-27 ENCOUNTER — Other Ambulatory Visit: Payer: Self-pay

## 2019-10-27 DIAGNOSIS — J438 Other emphysema: Secondary | ICD-10-CM | POA: Diagnosis not present

## 2019-10-27 MED ORDER — ALBUTEROL SULFATE HFA 108 (90 BASE) MCG/ACT IN AERS: 2.0000 | INHALATION_SPRAY | RESPIRATORY_TRACT | 1 refills | Status: DC | PRN

## 2019-10-29 ENCOUNTER — Encounter (HOSPITAL_COMMUNITY): Payer: Medicare HMO

## 2019-10-30 ENCOUNTER — Ambulatory Visit: Payer: Medicare HMO | Admitting: Neurology

## 2019-10-30 DIAGNOSIS — G4733 Obstructive sleep apnea (adult) (pediatric): Secondary | ICD-10-CM | POA: Diagnosis not present

## 2019-11-03 ENCOUNTER — Encounter (HOSPITAL_COMMUNITY): Payer: Medicare HMO

## 2019-11-05 ENCOUNTER — Encounter (HOSPITAL_COMMUNITY): Payer: Medicare HMO

## 2019-11-10 ENCOUNTER — Encounter: Payer: Self-pay | Admitting: Pulmonary Disease

## 2019-11-10 ENCOUNTER — Telehealth: Payer: Self-pay | Admitting: Pulmonary Disease

## 2019-11-10 ENCOUNTER — Other Ambulatory Visit: Payer: Self-pay

## 2019-11-10 ENCOUNTER — Ambulatory Visit: Payer: Medicare HMO | Admitting: Pulmonary Disease

## 2019-11-10 VITALS — BP 140/80 | HR 89 | Temp 97.7°F | Ht 68.5 in | Wt 264.6 lb

## 2019-11-10 DIAGNOSIS — Z789 Other specified health status: Secondary | ICD-10-CM | POA: Diagnosis not present

## 2019-11-10 DIAGNOSIS — R918 Other nonspecific abnormal finding of lung field: Secondary | ICD-10-CM | POA: Diagnosis not present

## 2019-11-10 DIAGNOSIS — J432 Centrilobular emphysema: Secondary | ICD-10-CM | POA: Diagnosis not present

## 2019-11-10 DIAGNOSIS — R0602 Shortness of breath: Secondary | ICD-10-CM | POA: Diagnosis not present

## 2019-11-10 DIAGNOSIS — Z23 Encounter for immunization: Secondary | ICD-10-CM

## 2019-11-10 DIAGNOSIS — J449 Chronic obstructive pulmonary disease, unspecified: Secondary | ICD-10-CM

## 2019-11-10 MED ORDER — TRELEGY ELLIPTA 100-62.5-25 MCG/INH IN AEPB: 1.0000 | INHALATION_SPRAY | Freq: Every day | RESPIRATORY_TRACT | 5 refills | Status: DC

## 2019-11-10 NOTE — Progress Notes (Signed)
[facility]  Chief Complaint  Patient presents with   Follow-up    SOB gotten worse since last visit    Constitutional:  BP 140/80 (BP Location: Left Arm, Cuff Size: Normal)    Pulse 89    Temp 97.7 F (36.5 C) (Temporal)    Ht 5' 8.5" (1.74 m)    Wt 264 lb 9.6 oz (120 kg)    SpO2 98%    BMI 39.65 kg/m   Past Medical History:  Carpal tunnel, GERD, Tremor, ED, Cataracts  Past Surgical History:  His  has a past surgical history that includes Hernia repair (Right, 2007); Cataract extraction (Bilateral, 2020); Vein Surgery (Bilateral, 2009); Rotator cuff repair (Right, 2014); Appendectomy (2015); Total shoulder replacement (Left, 2016); Deep brain stimulator placement (Left, 2016); Carpal tunnel release (Left, 2019); Tonsillectomy; and Adenoidectomy.  Brief Summary:  Allen [name] is a 69 y.o. male former smoker with asthma and obstructive sleep apnea.      Subjective:   Since his last visit with me he was seen by Wyn Quaker.  He had PFT and CT chest.  These showed evidence for COPD from emphysema.  Also showed several small lung nodules.  He was seen by Dr. Starling Manns and then Dr. Ernst Bowler from allergy and asthma.  He was tried on breztri and symbicort.  These were too expensive, and didn't seem to help.    He completed pulmonary rehab.  He is trying to keep up with exercises from there and has a Physiological scientist he is working with.  He is also changing his diet.  He hasn't been able to use Bipap for the past several months.  He had terrible face rash from several different masks.  He feels his sleep is okay.  He still gets winded with exertion.  He recovers after resting for several minutes.  He saw Dr. Harrell Gave with cardiology.  He had Echocardiogram in May that was unremarkable.  Physical Exam:   Appearance - well kempt   ENMT - no sinus tenderness, no oral exudate, no LAN, Mallampati 3 airway, no stridor  Respiratory - equal  breath sounds bilaterally, no wheezing or rales  CV - s1s2 regular rate and rhythm, no murmurs  Ext - no clubbing, no edema  Skin - no rashes  Psych - normal mood and affect   Pulmonary testing:   PFT 12/21/16 >> FEV1 2.40 (69%), FEV1% 74, TLC 4.95 (73%), DLCO 77%, +BD  IgE 04/10/19 >> 66  PFT 04/20/19 >> FEV1 2.09 (62%), FEV1% 74, TLC 5.33 (75%), DLCO 93%  Chest Imaging:   CT chest 05/04/19 >> centrilobular and paraseptal emphysema, 3 mm nodule RUL, 4 mm nodule RML, 3 mm nodule lingula  Sleep Tests:   HST 04/20/15 >> AHI 40  Auto Bipap 05/21/19 to 06/01/19 >> used on 11 of 12 nights with average 6 hrs 50 min.  Average AHI 9.1 with median Bipap 11/7 and 95 th percentile Bipap 12/8 cm H2O.   Cardiac Tests:   Echo 05/29/19 >> EF 60 to 65%, grade 1 DD, mod LA dilation  Social History:  He  reports that he quit smoking about 30 years ago. His smoking use included cigarettes. He has a 60.00 pack-year smoking history. He has quit using smokeless tobacco. He reports current alcohol use of about 14.0 standard drinks of alcohol per week. He reports that he does not use drugs.  Family History:  His family history includes Asthma in his father; COPD  in his mother; Healthy in his child; Rectal cancer in his sister.    Labs:   CMP Latest Ref Rng & Units 05/06/2019 04/10/2019  Glucose 70 - 99 mg/dL 100(H) 108(H)  BUN 6 - 23 mg/dL 20 21  Creatinine 0.40 - 1.50 mg/dL 0.98 1.01  Sodium 135 - 145 mEq/L 138 138  Potassium 3.5 - 5.1 mEq/L 4.5 4.3  Chloride 96 - 112 mEq/L 104 106  CO2 19 - 32 mEq/L 28 26  Calcium 8.4 - 10.5 mg/dL 9.1 9.2  Total Protein 6.0 - 8.3 g/dL 6.5 6.6  Total Bilirubin 0.2 - 1.2 mg/dL 0.5 0.3  Alkaline Phos 39 - 117 U/L 50 55  AST 0 - 37 U/L 19 18  ALT 0 - 53 U/L 18 22    CBC Latest Ref Rng & Units 05/06/2019 04/10/2019  WBC 4.0 - 10.5 K/uL 6.5 7.2  Hemoglobin 13.0 - 17.0 g/dL 13.2 13.0  Hematocrit 39 - 52 % 39.9 39.5  Platelets 150 - 400 K/uL 304.0 267.0     Assessment/Plan:   COPD with emphysema and asthma. - breztri and symbicort were ineffective and too expensive - will change him back to trelegy 100 one puff daily - continue singulair - prn albuterol - will arrange for overnight oximetry on room air to determine if he might be a candidate for  - high dose influenza vaccine today  Obstructive sleep apnea. - he was intolerant of masks due to skin rash and hasn't used Bipap for the past several months - encouraged him to keep up with his weight loss efforts and monitor his sleep pattern  Lung nodules. - he will need follow up non contrast CT chest in April 2022  Dyspnea on exertion. - had cardiology assessment with Dr. Harrell Gave that was unrevealing - lab work okay - likely from COPD, emphysema, asthma, and deconditioning - encouraged him to maintain his exercise regimen  Perennial allergic rhinitis with nonallergic component. - followed by Dr. Salvatore Marvel with Allergy and Asthma  Tremor. - followed by Dr. Wells Guiles Tat with [facility]  Time Spent Involved in Patient Care on Day of Examination:  47 minutes  Follow up:  Patient Instructions  Trelegy one puff daily  Will arrange for overnight oxygen test  High dose flu shot today  Follow up in 2 months   Medication List:   Allergies as of 11/10/2019   No Known Allergies     Medication List       Accurate as of November 10, 2019 10:14 AM. If you have any questions, ask your nurse or doctor.        STOP taking these medications   Breztri Aerosphere 160-9-4.8 MCG/ACT Aero Generic drug: Budeson-Glycopyrrol-Formoterol Stopped by: Chesley Mires, MD   budesonide-formoterol 160-4.5 MCG/ACT inhaler Commonly known as: Symbicort Stopped by: Chesley Mires, MD   furosemide 20 MG tablet Commonly known as: LASIX Stopped by: Chesley Mires, MD   gabapentin 100 MG capsule Commonly known as: NEURONTIN Stopped by: Chesley Mires, MD   methylPREDNISolone 4 MG  Tbpk tablet Commonly known as: MEDROL DOSEPAK Stopped by: Chesley Mires, MD   predniSONE 1 MG tablet Commonly known as: DELTASONE Stopped by: Chesley Mires, MD   primidone 50 MG tablet Commonly known as: MYSOLINE Stopped by: Chesley Mires, MD   tamsulosin 0.4 MG Caps capsule Commonly known as: FLOMAX Stopped by: Chesley Mires, MD     TAKE these medications   albuterol (2.5 MG/3ML) 0.083% nebulizer solution Commonly known as: PROVENTIL Take  3 mLs (2.5 mg total) by nebulization every 6 (six) hours.   albuterol 108 (90 Base) MCG/ACT inhaler Commonly known as: VENTOLIN HFA Inhale 2 puffs into the lungs every 4 (four) hours as needed for wheezing or shortness of breath.   azelastine 0.1 % nasal spray Commonly known as: ASTELIN Place 1-2 sprays into both nostrils 2 (two) times daily as needed for rhinitis.   Azelastine-Fluticasone 137-50 MCG/ACT Susp Place 1 spray into both nostrils 2 (two) times daily as needed.   benzonatate 100 MG capsule Commonly known as: TESSALON TAKE 1 CAPSULE (100 MG TOTAL) BY MOUTH 2 (TWO) TIMES DAILY AS NEEDED FOR COUGH.   chlorpheniramine-HYDROcodone 10-8 MG/5ML Suer Commonly known as: Tussionex Pennkinetic ER Take 5 mLs by mouth every 12 (twelve) hours as needed for cough.   fluticasone 50 MCG/ACT nasal spray Commonly known as: Flonase Place 2 sprays into both nostrils daily as needed for allergies or rhinitis.   montelukast 10 MG tablet Commonly known as: SINGULAIR Take 1 tablet (10 mg total) by mouth at bedtime.   omeprazole 20 MG capsule Commonly known as: PRILOSEC Take 1 capsule (20 mg total) by mouth daily.   optichamber diamond Misc Please use as directed with Breztri inhaler.   Shingrix injection Generic drug: Zoster Vaccine Adjuvanted   traZODone 100 MG tablet Commonly known as: DESYREL Take 200 mg by mouth at bedtime.   Trelegy Ellipta 100-62.5-25 MCG/INH Aepb Generic drug: Fluticasone-Umeclidin-Vilant Inhale 1 puff into the  lungs daily. Started by: Chesley Mires, MD       Signature:  Chesley Mires, MD [place] Pager - [phone] 11/10/2019, 10:14 AM

## 2019-11-10 NOTE — Patient Instructions (Signed)
Trelegy one puff daily  Will arrange for overnight oxygen test  High dose flu shot today  Follow up in 2 months

## 2019-11-10 NOTE — Telephone Encounter (Signed)
   Spoke to patient, who is requesting clarification on Trelegy Rx. Patient stated that he was given trelegy 200 previously, however Trelegy 100 was called in today.  I have made patient aware of Dr. Halford Chessman plan noted below. Patient is aware and voiced his understanding.  Nothing further needed.       Assessment/Plan:   COPD with emphysema and asthma. - breztri and symbicort were ineffective and too expensive - will change him back to trelegy 100 one puff daily - continue singulair - prn albuterol - will arrange for overnight oximetry on room air to determine if he might be a candidate for  - high dose influenza vaccine today

## 2019-11-11 NOTE — Progress Notes (Signed)
Discharge Progress Report  Patient Details  Name: Allen [name] MRN: [id number] Date of Birth: 04-11-1950 Referring Provider:     Pulmonary Rehab Walk Test from 09/01/2019 in [facility]  Referring Provider Dr. Craige Cotta       Number of Visits: 12  Reason for Discharge:  Patient reached a stable level of exercise. Patient independent in their exercise. Patient has met program and personal goals.  Smoking History:  Social History   Tobacco Use  Smoking Status Former Smoker  . Packs/day: 3.00  . Years: 20.00  . Pack years: 60.00  . Types: Cigarettes  . Quit date: 01/15/1989  . Years since quitting: 30.8  Smokeless Tobacco Former User    Diagnosis:  Other emphysema (HCC)  ADL UCSD:  Pulmonary Assessment Scores    Row Name 09/01/19 1500 10/27/19 1554       ADL UCSD   ADL Phase Entry Exit    SOB Score total 32 45      CAT Score   CAT Score 13 19      mMRC Score   mMRC Score 2 2           Initial Exercise Prescription:  Initial Exercise Prescription - 09/01/19 1600      Date of Initial Exercise RX and Referring Provider   Date 09/01/19    Referring Provider Dr. Craige Cotta    Expected Discharge Date 10/29/19      Treadmill   MPH 2    Grade 0    Minutes 15      NuStep   Level 2    SPM 80    Minutes 15      Prescription Details   Frequency (times per week) 2    Duration Progress to 30 minutes of continuous aerobic without signs/symptoms of physical distress      Intensity   THRR 40-80% of Max Heartrate 60-121    Ratings of Perceived Exertion 11-13    Perceived Dyspnea 0-4      Progression   Progression Continue to progress workloads to maintain intensity without signs/symptoms of physical distress.      Resistance Training   Training Prescription Yes    Weight blue bands    Reps 10-15           Discharge Exercise Prescription (Final Exercise Prescription Changes):  Exercise Prescription Changes - 10/13/19 1600       Response to Exercise   Blood Pressure (Admit) 138/86    Blood Pressure (Exit) 124/70    Heart Rate (Admit) 78 bpm    Heart Rate (Exercise) 111 bpm    Heart Rate (Exit) 77 bpm    Oxygen Saturation (Admit) 96 %    Oxygen Saturation (Exercise) 95 %    Oxygen Saturation (Exit) 96 %    Rating of Perceived Exertion (Exercise) 12    Perceived Dyspnea (Exercise) 1    Duration Continue with 30 min of aerobic exercise without signs/symptoms of physical distress.    Intensity THRR unchanged      Progression   Progression Continue to progress workloads to maintain intensity without signs/symptoms of physical distress.    Average METs 2.3      Resistance Training   Training Prescription Yes    Weight blue bands    Reps 10-15    Time 10 Minutes      Treadmill   MPH 2.5    Grade 0    Minutes 15    METs 2.9  NuStep   Level 4    SPM 80    Minutes 15    METs 2.3           Functional Capacity:  6 Minute Walk    Row Name 09/01/19 1505 10/27/19 1550       6 Minute Walk   Phase -- Discharge    Distance 1615 feet 1623 feet    Distance % Change -- 0.49 %    Distance Feet Change -- 8 ft    Walk Time 6 minutes 6 minutes    # of Rest Breaks 0 0    MPH 3.05 3.07    METS 3.09 3.11    RPE 12 12    Perceived Dyspnea  1 2    VO2 Peak 10.84 10.89    Symptoms No No    Resting HR 75 bpm 79 bpm    Resting BP 128/82 140/80    Resting Oxygen Saturation  95 % 96 %    Exercise Oxygen Saturation  during 6 min walk 93 % 94 %    Max Ex. HR 108 bpm 114 bpm    Max Ex. BP 140/76 140/80    2 Minute Post BP 126/80 130/74      Interval HR   1 Minute HR 108 114    2 Minute HR 108 108    3 Minute HR 105 111    4 Minute HR 105 110    5 Minute HR 107 109    6 Minute HR 105 108    2 Minute Post HR 81 76    Interval Heart Rate? Yes Yes      Interval Oxygen   Interval Oxygen? Yes Yes    Baseline Oxygen Saturation % 95 % 96 %    1 Minute Oxygen Saturation % 93 % 95 %    1 Minute Liters  of Oxygen 0 L 0 L    2 Minute Oxygen Saturation % 93 % 95 %    2 Minute Liters of Oxygen 0 L 0 L    3 Minute Oxygen Saturation % 94 % 94 %    3 Minute Liters of Oxygen 0 L 0 L    4 Minute Oxygen Saturation % 94 % 96 %    4 Minute Liters of Oxygen 0 L 0 L    5 Minute Oxygen Saturation % 95 % 95 %    5 Minute Liters of Oxygen 0 L 0 L    6 Minute Oxygen Saturation % 95 % 96 %    6 Minute Liters of Oxygen 0 L 0 L    2 Minute Post Oxygen Saturation % 97 % 96 %    2 Minute Post Liters of Oxygen 0 L 0 L           Psychological, QOL, Others - Outcomes: PHQ 2/9: Depression screen [facility] 2/9 10/27/2019 09/01/2019 05/06/2019 09/18/2018  Decreased Interest 0 0 0 0  Down, Depressed, Hopeless 1 1 2  0  PHQ - 2 Score 1 1 2  0  Altered sleeping 3 3 2 3   Tired, decreased energy 1 1 2  0  Change in appetite - 0 0 0  Feeling bad or failure about yourself  0 1 0 0  Trouble concentrating 0 0 0 0  Moving slowly or fidgety/restless 0 0 0 0  Suicidal thoughts 0 0 0 0  PHQ-9 Score 5 6 6 3   Difficult doing work/chores Not difficult at all Not  difficult at all Not difficult at all -    Quality of Life:   Personal Goals: Goals established at orientation with interventions provided to work toward goal.  Personal Goals and Risk Factors at Admission - 09/01/19 0908      Core Components/Risk Factors/Patient Goals on Admission   Improve shortness of breath with ADL's Yes    Intervention Provide education, individualized exercise plan and daily activity instruction to help decrease symptoms of SOB with activities of daily living.    Expected Outcomes Short Term: Improve cardiorespiratory fitness to achieve a reduction of symptoms when performing ADLs;Long Term: Be able to perform more ADLs without symptoms or delay the onset of symptoms            Personal Goals Discharge:  Goals and Risk Factor Review    Row Name 09/23/19 1531 10/12/19 1633 11/11/19 2212         Core Components/Risk Factors/Patient  Goals Review   Personal Goals Review Improve shortness of breath with ADL's;Develop more efficient breathing techniques such as purse lipped breathing and diaphragmatic breathing and practicing self-pacing with activity.;Increase knowledge of respiratory medications and ability to use respiratory devices properly. Improve shortness of breath with ADL's;Develop more efficient breathing techniques such as purse lipped breathing and diaphragmatic breathing and practicing self-pacing with activity.;Increase knowledge of respiratory medications and ability to use respiratory devices properly. --     Review Domenique has completed 4 exercise sessions in pulmonary rehab. Will work with pt to increase his SPM  presently MET level of 2.0 on the nustep which is weight based. Review PLB techniques with pt with  verbal cues.  Will begin to move pt from cues to independence. La has completed 9 exercise sessions in pulmonary rehab. Keltin has increased his level 4 on the nustep with MET of 2.4. Pt with treadmill for 2.5 over 0 incline rates at failry light.  Will see if incline is tolerated. Pt observed using PLB independently. Reports dyspnea scale - 1. Shanon Brow graduates with the completion of 12 exercise sessions.  Markos increased by 8 feet post walk test.  Wladyslaw will plan to continue exercising with walking.     Expected Outcomes See Admission Goals. See Admission Goals. Shanon Brow met Admission goals            Exercise Goals and Review:  Exercise Goals    Row Name 09/01/19 1648             Exercise Goals   Increase Physical Activity Yes       Intervention Provide advice, education, support and counseling about physical activity/exercise needs.;Develop an individualized exercise prescription for aerobic and resistive training based on initial evaluation findings, risk stratification, comorbidities and participant's personal goals.       Expected Outcomes Short Term: Attend rehab on a regular basis to increase  amount of physical activity.;Long Term: Add in home exercise to make exercise part of routine and to increase amount of physical activity.;Long Term: Exercising regularly at least 3-5 days a week.       Increase Strength and Stamina Yes       Intervention Provide advice, education, support and counseling about physical activity/exercise needs.;Develop an individualized exercise prescription for aerobic and resistive training based on initial evaluation findings, risk stratification, comorbidities and participant's personal goals.       Expected Outcomes Short Term: Increase workloads from initial exercise prescription for resistance, speed, and METs.;Short Term: Perform resistance training exercises routinely during rehab and add in resistance  training at home;Long Term: Improve cardiorespiratory fitness, muscular endurance and strength as measured by increased METs and functional capacity (6MWT)       Able to understand and use rate of perceived exertion (RPE) scale Yes       Intervention Provide education and explanation on how to use RPE scale       Expected Outcomes Short Term: Able to use RPE daily in rehab to express subjective intensity level;Long Term:  Able to use RPE to guide intensity level when exercising independently       Able to understand and use Dyspnea scale Yes       Intervention Provide education and explanation on how to use Dyspnea scale       Expected Outcomes Short Term: Able to use Dyspnea scale daily in rehab to express subjective sense of shortness of breath during exertion;Long Term: Able to use Dyspnea scale to guide intensity level when exercising independently       Knowledge and understanding of Target Heart Rate Range (THRR) Yes       Intervention Provide education and explanation of THRR including how the numbers were predicted and where they are located for reference       Expected Outcomes Short Term: Able to state/look up THRR;Long Term: Able to use THRR to govern  intensity when exercising independently;Short Term: Able to use daily as guideline for intensity in rehab       Understanding of Exercise Prescription Yes       Intervention Provide education, explanation, and written materials on patient's individual exercise prescription       Expected Outcomes Short Term: Able to explain program exercise prescription;Long Term: Able to explain home exercise prescription to exercise independently              Exercise Goals Re-Evaluation:  Exercise Goals Re-Evaluation    Row Name 09/24/19 1601 10/13/19 0820           Exercise Goal Re-Evaluation   Exercise Goals Review Increase Physical Activity;Increase Strength and Stamina;Able to understand and use rate of perceived exertion (RPE) scale;Able to understand and use Dyspnea scale;Knowledge and understanding of Target Heart Rate Range (THRR);Able to check pulse independently;Understanding of Exercise Prescription;Improve claudication pain tolerance and improve walking ability Increase Physical Activity;Increase Strength and Stamina;Able to understand and use rate of perceived exertion (RPE) scale;Able to understand and use Dyspnea scale;Knowledge and understanding of Target Heart Rate Range (THRR);Understanding of Exercise Prescription      Comments Pt has completed 5 exercise sessions and is tolerating it well with no complaints or concers. He has a positive outlook and is motivated to keep up with exercise outside of rehab. He is currently exercising at 2.1 METs on the Nustep. We will continue to progress and monitor as able. Pt has completed 9 exercise sessions and is making steady progressions with no complaints. Pt is motivated and likes to push himself while exercising. He continues to exercise outside of rehab as well. He is currently exercising at 2.4 METS on the Nustep. We will continue to monitor and progress as he is able.      Expected Outcomes Through exercise at rehab and home the patient will  decrease shortness of breath with daily activities and feel confident in carrying out an exercise regimn at home Through exercise at rehab and home the patient will decrease shortness of breath with daily activities and feel confident in carrying out an exercise regimn at home  Nutrition & Weight - Outcomes:  Pre Biometrics - 09/01/19 1503      Pre Biometrics   Grip Strength 36 kg            Nutrition:  Nutrition Therapy & Goals - 09/15/19 1126      Nutrition Therapy   Diet Low sodium      Personal Nutrition Goals   Nutrition Goal Choose variety of foods to make diet pattern more sustainable    Personal Goal #2 Pt to identify food quantities necessary to achieve weight loss of 6-24 lb at graduation from pulmonary rehab.    Personal Goal #3 Pt to stay active by incorporating 30 minutes of exercise 3-5 d/week      Intervention Plan   Intervention Prescribe, educate and counsel regarding individualized specific dietary modifications aiming towards targeted core components such as weight, hypertension, lipid management, diabetes, heart failure and other comorbidities.    Expected Outcomes Short Term Goal: A plan has been developed with personal nutrition goals set during dietitian appointment.;Long Term Goal: Adherence to prescribed nutrition plan.           Nutrition Discharge:  Nutrition Assessments - 10/30/19 1415      Rate Your Plate Scores   Post Score 51           Education Questionnaire Score:  Knowledge Questionnaire Score - 10/27/19 1554      Knowledge Questionnaire Score   Post Score 14/18           Goals reviewed with patient. Cherre Huger, BSN Cardiac and Training and development officer

## 2019-11-17 DIAGNOSIS — G4730 Sleep apnea, unspecified: Secondary | ICD-10-CM | POA: Diagnosis not present

## 2019-11-17 DIAGNOSIS — R0683 Snoring: Secondary | ICD-10-CM | POA: Diagnosis not present

## 2019-11-18 ENCOUNTER — Encounter: Payer: Self-pay | Admitting: Family Medicine

## 2019-11-18 ENCOUNTER — Ambulatory Visit: Payer: Medicare HMO | Admitting: Family Medicine

## 2019-11-18 ENCOUNTER — Other Ambulatory Visit: Payer: Self-pay

## 2019-11-18 VITALS — BP 114/84 | HR 80 | Ht 68.5 in | Wt 259.0 lb

## 2019-11-18 DIAGNOSIS — M5136 Other intervertebral disc degeneration, lumbar region: Secondary | ICD-10-CM

## 2019-11-18 DIAGNOSIS — M5030 Other cervical disc degeneration, unspecified cervical region: Secondary | ICD-10-CM

## 2019-11-18 DIAGNOSIS — M7061 Trochanteric bursitis, right hip: Secondary | ICD-10-CM

## 2019-11-18 DIAGNOSIS — M999 Biomechanical lesion, unspecified: Secondary | ICD-10-CM

## 2019-11-18 DIAGNOSIS — M51369 Other intervertebral disc degeneration, lumbar region without mention of lumbar back pain or lower extremity pain: Secondary | ICD-10-CM

## 2019-11-18 NOTE — Assessment & Plan Note (Signed)
Significant overall but is responding well to osteopathic manipulation.  Patient is not taking significant amount of medications he is taking some mild trazodone to help him with sleep at night.  Patient is going to continue to work with core strengthening.  Encourage weight loss.  Follow-up again in 4 to 8 weeks

## 2019-11-18 NOTE — Assessment & Plan Note (Signed)
Right bursitis.  Discussed icing regimen and home exercise, which activities to do which wants to avoid.  Patient given injection and tolerated the procedure well.  Follow-up again in 4 to 8 weeks

## 2019-11-18 NOTE — Progress Notes (Signed)
[place] [address] [place] Phone: [phone] Subjective:   I Allen [name] am serving as a Education administrator for Dr. Hulan Saas.  This visit occurred during the SARS-CoV-2 public health emergency.  Safety protocols were in place, including screening questions prior to the visit, additional usage of staff PPE, and extensive cleaning of exam room while observing appropriate contact time as indicated for disinfecting solutions.   I'm seeing this patient by the request  of:  Isaac Bliss, Rayford Halsted, MD  CC: New onset right-sided hip pain, follow-up back pain and neck pain  QMV:[id number]  Allen [name] is a 69 y.o. male coming in with complaint of back and neck pain. OMT 10/07/2019. Patient states he is feeling sore as usual. Walked about 3-4 miles and has lateral hip pain on the right side.  Patient states that it is severe and can wake him up at night.  Patient states that he is sore after sitting for long amount of time.  Back and neck is about the same.  He did notice some mild increase in discomfort after walking long distances.         Reviewed prior external information including notes and imaging from previsou exam, outside providers and external EMR if available.   As well as notes that were available from care everywhere and other healthcare systems.  Past medical history, social, surgical and family history all reviewed in electronic medical record.  No pertanent information unless stated regarding to the chief complaint.   Past Medical History:  Diagnosis Date  . Asthma   . Bilateral cataracts   . COPD (chronic obstructive pulmonary disease) ([place])   . ED (erectile dysfunction)   . Essential tremor    with nerve stimulator  . GERD (gastroesophageal reflux disease)   . Insomnia   . OSA treated with BiPAP   . Recurrent upper respiratory infection (URI)   . Right carpal tunnel syndrome     No Known Allergies   Review  of Systems:  No headache, visual changes, nausea, vomiting, diarrhea, constipation, dizziness, abdominal pain, skin rash, fevers, chills, night sweats, weight loss, swollen lymph nodes, body aches, joint swelling, chest pain, shortness of breath, mood changes. POSITIVE muscle aches  Objective  There were no vitals taken for this visit.   General: No apparent distress alert and oriented x3 mood and affect normal, dressed appropriately.  HEENT: Pupils equal, extraocular movements intact  Respiratory: Patient's speak in full sentences and does not appear short of breath  Cardiovascular: No lower extremity edema, non tender, no erythema  Arthritic changes of multiple joints. Low back exam does have some mild loss of lordosis.  Tender to palpation of paraspinal musculature of the lumbar spine right greater than left.  Tightness noted with FABER test bilaterally with severe tenderness over the right greater trochanteric area right greater than left.  Neurovascularly intact in the lower extremities  Neck exam also has loss of lordosis but negative Spurling's.  Patient does have limited sidebending of the neck bilaterally.  After verbal consent patient was prepped with alcohol swab and with a 21-gauge 2 inch needle injected into the right greater trochanteric area with a total of 1 cc of 0.5% Marcaine and 1 cc of Kenalog 40 mg/mL  Osteopathic findings   C6 flexed rotated and side bent left T5 extended rotated and side bent left L2 flexed rotated and side bent right Sacrum right on right  Assessment and Plan:  Degenerative disc disease, lumbar Significant overall but is responding well to osteopathic manipulation.  Patient is not taking significant amount of medications he is taking some mild trazodone to help him with sleep at night.  Patient is going to continue to work with core strengthening.  Encourage weight loss.  Follow-up again in 4 to 8 weeks  Degenerative disc disease,  cervical Also stable at this time.  We will continue to monitor.  Has significant loss of lordosis.  Responding fairly well to muscle energy.  Follow-up again 6 to 8 weeks.  Greater trochanteric bursitis of right hip Right bursitis.  Discussed icing regimen and home exercise, which activities to do which wants to avoid.  Patient given injection and tolerated the procedure well.  Follow-up again in 4 to 8 weeks    Nonallopathic problems  Decision today to treat with OMT was based on Physical Exam  After verbal consent patient was treated with  ME, FPR techniques in cervical,  thoracic, lumbar, and sacral  areas  Patient tolerated the procedure well with improvement in symptoms  Patient given exercises, stretches and lifestyle modifications  See medications in patient instructions if given  Patient will follow up in 4-8 weeks      The above documentation has been reviewed and is accurate and complete Lyndal Pulley, DO       Note: This dictation was prepared with Dragon dictation along with smaller phrase technology. Any transcriptional errors that result from this process are unintentional.

## 2019-11-18 NOTE — Patient Instructions (Signed)
Good to see you Ice 20 minutes  Exercise 3 times a week See me again in 6-8 weeks

## 2019-11-18 NOTE — Assessment & Plan Note (Signed)
Also stable at this time.  We will continue to monitor.  Has significant loss of lordosis.  Responding fairly well to muscle energy.  Follow-up again 6 to 8 weeks.

## 2019-11-20 ENCOUNTER — Telehealth: Payer: Self-pay | Admitting: Pulmonary Disease

## 2019-11-20 DIAGNOSIS — J432 Centrilobular emphysema: Secondary | ICD-10-CM | POA: Diagnosis not present

## 2019-11-20 DIAGNOSIS — G4733 Obstructive sleep apnea (adult) (pediatric): Secondary | ICD-10-CM | POA: Diagnosis not present

## 2019-11-20 DIAGNOSIS — S86311A Strain of muscle(s) and tendon(s) of peroneal muscle group at lower leg level, right leg, initial encounter: Secondary | ICD-10-CM | POA: Diagnosis not present

## 2019-11-20 DIAGNOSIS — S86311D Strain of muscle(s) and tendon(s) of peroneal muscle group at lower leg level, right leg, subsequent encounter: Secondary | ICD-10-CM | POA: Diagnosis not present

## 2019-11-20 DIAGNOSIS — I5032 Chronic diastolic (congestive) heart failure: Secondary | ICD-10-CM | POA: Diagnosis not present

## 2019-11-20 NOTE — Addendum Note (Signed)
Addended by: Merrilee Seashore on: 11/20/2019 03:30 PM   Modules accepted: Orders

## 2019-11-20 NOTE — Telephone Encounter (Signed)
ONO with RA 11/17/19 >> test time 2 hrs 42 min.  Baseline SpO2 88%, low SpO2 82%.  Spent 1 hr 27 min with SpO2 < 88%.   Please let him know his oxygen level is low at night.  Please arrange for him to be set up with 2 liters oxygen at night.

## 2019-11-20 NOTE — Telephone Encounter (Signed)
Patient returned phone call and writer went over ONO results per Dr Halford Chessman. All questions answered and patient expressed understanding and agreeable to O2 at 2 liters to be worn at night being ordered. Order placed per Dr Halford Chessman. Confirmed with patient upcoming scheduled appointment with Dr Halford Chessman for 12/30/2019 at 9:45. Nothing further needed at this time.

## 2019-11-20 NOTE — Telephone Encounter (Signed)
Called and left message on voicemail to please return phone call for results. Contact number provided.

## 2019-11-23 ENCOUNTER — Ambulatory Visit: Payer: Medicare HMO | Admitting: Neurology

## 2019-11-23 DIAGNOSIS — N5201 Erectile dysfunction due to arterial insufficiency: Secondary | ICD-10-CM | POA: Diagnosis not present

## 2019-11-23 DIAGNOSIS — R972 Elevated prostate specific antigen [PSA]: Secondary | ICD-10-CM | POA: Diagnosis not present

## 2019-11-23 DIAGNOSIS — N401 Enlarged prostate with lower urinary tract symptoms: Secondary | ICD-10-CM | POA: Diagnosis not present

## 2019-11-23 DIAGNOSIS — R351 Nocturia: Secondary | ICD-10-CM | POA: Diagnosis not present

## 2019-11-23 NOTE — Procedures (Signed)
DBS Programming was performed.    Manufacturer of DBS device: [employer]  Total time spent programming was 10 minutes.  Device was confirmed to be on.  Soft start was confirmed to be on.  Impedences were checked and were within normal limits (oth therapy and all electrode).  Battery was checked and was determined to be functioning normally but close to near the end of life.  Final settings were as follows:   Active Contact Amplitude (V) PW (ms) Frequency (hz) Side Effects Battery  Left Brain        Group A        11/26/19 2-C+ 2.1 60 150                    Group B (active)        11/26/19 2-0+ 2.9 90 170  2.63                  Right Brain

## 2019-11-23 NOTE — Progress Notes (Signed)
Assessment/Plan:    1.  Essential Tremor  -Patient is status post DBS surgery in [place], 2016.  Patient previously unsatisfied with results when he came to see me, although it was clear that the DBS was helping some.  We have re-programmed the device and he agrees that he is getting pretty good control on tremor.  -battery is getting close to ERI.  Told him to check weekly and call me should he see the ERI.  I will see him in 6 weeks to check it.    2.  Copd  -Following with Dr. Halford Chessman.  On Singulair, albuterol, Trelegy, which may be contributing to tremor some  Subjective:   Allen [name] was seen today in follow up for essential tremor.  My previous records were reviewed prior to todays visit. Pt denies falls.  Pt denies lightheadedness, near syncope.  No hallucinations.  Dr. Halford Chessman records from October 26 are reviewed.  Tremor fairly well controlled but some trouble eating soup.  Tremor comes and goes.    Current prescribed movement disorder medications: Primidone, 50 mg nightly (restarted last visit at patient request but he stopped it due to ED)   PREVIOUS MEDICATIONS:  primidone (pt wanted to restart but felt caused ED at low dose), propranolol, gabapentin; not beta-blocker candidate now because of asthma  ALLERGIES:  No Known Allergies  CURRENT MEDICATIONS:  Outpatient Encounter Medications as of 11/26/2019  Medication Sig  . albuterol (PROVENTIL) (2.5 MG/3ML) 0.083% nebulizer solution Take 3 mLs (2.5 mg total) by nebulization every 6 (six) hours.  [name] Kitchen albuterol (VENTOLIN HFA) 108 (90 Base) MCG/ACT inhaler Inhale 2 puffs into the lungs every 4 (four) hours as needed for wheezing or shortness of breath.  [name] Kitchen azelastine (ASTELIN) 0.1 % nasal spray Place 1-2 sprays into both nostrils 2 (two) times daily as needed for rhinitis.  . Azelastine-Fluticasone 137-50 MCG/ACT SUSP Place 1 spray into both nostrils 2 (two) times daily as needed.  . benzonatate (TESSALON) 100 MG capsule TAKE 1  CAPSULE (100 MG TOTAL) BY MOUTH 2 (TWO) TIMES DAILY AS NEEDED FOR COUGH.  . fluticasone (FLONASE) 50 MCG/ACT nasal spray Place 2 sprays into both nostrils daily as needed for allergies or rhinitis.  . Fluticasone-Umeclidin-Vilant (TRELEGY ELLIPTA) 100-62.5-25 MCG/INH AEPB Inhale 1 puff into the lungs daily.  . montelukast (SINGULAIR) 10 MG tablet Take 1 tablet (10 mg total) by mouth at bedtime.  . traZODone (DESYREL) 100 MG tablet Take 200 mg by mouth at bedtime.  . [DISCONTINUED] alfuzosin (UROXATRAL) 10 MG 24 hr tablet Take 10 mg by mouth daily. (Patient not taking: Reported on 11/26/2019)  . [DISCONTINUED] chlorpheniramine-HYDROcodone (TUSSIONEX PENNKINETIC ER) 10-8 MG/5ML SUER Take 5 mLs by mouth every 12 (twelve) hours as needed for cough. (Patient not taking: Reported on 11/26/2019)  . [DISCONTINUED] omeprazole (PRILOSEC) 20 MG capsule Take 1 capsule (20 mg total) by mouth daily. (Patient not taking: Reported on 11/26/2019)  . [DISCONTINUED] SHINGRIX injection  (Patient not taking: Reported on 11/26/2019)  . [DISCONTINUED] Spacer/Aero-Holding [facility] L[facility] DIAMOND) MISC Please use as directed with Breztri inhaler. (Patient not taking: Reported on 11/26/2019)   No facility-administered encounter medications on file as of 11/26/2019.     Objective:    PHYSICAL EXAMINATION:    VITALS:   Vitals:   11/26/19 0809  BP: (!) 144/85  Pulse: 80  SpO2: 96%  Weight: 258 lb (117 kg)  Height: 5' 10.5" (1.791 m)    GEN:  The patient appears stated age and is in  NAD. HEENT:  Normocephalic, atraumatic.  The mucous membranes are moist. The superficial temporal arteries are without ropiness or tenderness. CV:  RRR Lungs:  CTAB Neck/HEME:  There are no carotid bruits bilaterally.  Neurological examination:  Orientation: The patient is alert and oriented x3. Cranial nerves: There is good facial symmetry. The speech is fluent and clear. Soft palate rises symmetrically and there is no  tongue deviation. Hearing is intact to conversational tone. Sensation: Sensation is intact to light touch throughout Motor: Strength is at least antigravity x4.  Movement examination: Tone: There is normal tone in the UE/LE Abnormal movements: No rest tremor.  No postural tremor.  No intention tremor is noted.  No tremor when he is given a weight. Coordination:  There is no decremation with RAM's Gait and Station: The patient has no difficulty arising out of a deep-seated chair without the use of the hands. The patient's stride length is good I have reviewed and interpreted the following labs independently   Chemistry      Component Value Date/Time   NA 138 05/06/2019 1047   K 4.5 05/06/2019 1047   CL 104 05/06/2019 1047   CO2 28 05/06/2019 1047   BUN 20 05/06/2019 1047   CREATININE 0.98 05/06/2019 1047      Component Value Date/Time   CALCIUM 9.1 05/06/2019 1047   ALKPHOS 50 05/06/2019 1047   AST 19 05/06/2019 1047   ALT 18 05/06/2019 1047   BILITOT 0.5 05/06/2019 1047      Lab Results  Component Value Date   WBC 6.5 05/06/2019   HGB 13.2 05/06/2019   HCT 39.9 05/06/2019   MCV 95.0 05/06/2019   PLT 304.0 05/06/2019   Lab Results  Component Value Date   TSH 1.54 05/06/2019     Chemistry      Component Value Date/Time   NA 138 05/06/2019 1047   K 4.5 05/06/2019 1047   CL 104 05/06/2019 1047   CO2 28 05/06/2019 1047   BUN 20 05/06/2019 1047   CREATININE 0.98 05/06/2019 1047      Component Value Date/Time   CALCIUM 9.1 05/06/2019 1047   ALKPHOS 50 05/06/2019 1047   AST 19 05/06/2019 1047   ALT 18 05/06/2019 1047   BILITOT 0.5 05/06/2019 1047         Total time spent on today's visit was 20 minutes, including both face-to-face time and nonface-to-face time.  Time included that spent on review of records (prior notes available to me/labs/imaging if pertinent), discussing treatment and goals, answering patient's questions and coordinating care.  This did not  include DBS time.  Cc:  Isaac Bliss, Rayford Halsted, MD

## 2019-11-24 ENCOUNTER — Other Ambulatory Visit: Payer: Self-pay

## 2019-11-24 ENCOUNTER — Ambulatory Visit: Payer: Medicare HMO | Admitting: Podiatry

## 2019-11-24 DIAGNOSIS — M25571 Pain in right ankle and joints of right foot: Secondary | ICD-10-CM | POA: Diagnosis not present

## 2019-11-24 DIAGNOSIS — M25371 Other instability, right ankle: Secondary | ICD-10-CM

## 2019-11-24 DIAGNOSIS — M25471 Effusion, right ankle: Secondary | ICD-10-CM | POA: Diagnosis not present

## 2019-11-24 NOTE — Progress Notes (Signed)
  Subjective:  Patient ID: Allen [name], male    DOB: 08-16-1950,  MRN: [id number]  Chief Complaint  Patient presents with  . Gait Problem    Pt states tri-lock brace and compression socks have been very effective. Pt states he is much improved. Denies any new concerns.     69 y.o. male presents with the above complaint. History confirmed with patient.   Objective:  Physical Exam: warm, good capillary refill, no trophic changes or ulcerative lesions, normal DP and PT pulses and normal sensory exam. Right Foot: no pain at the peroneal tendons, mild pain at the ATFL. Negative anterior drawer, talar tilt. Only slight peroneal snap after successive ROM.  Assessment:   1. Ankle instability, right   2. Pain and swelling of right ankle      Plan:  Patient was evaluated and treated and all questions answered.  Ankle instability right -Appears resolved. D/c brace. Discussed liklihood of flare-ups. F/u should pain persist  No follow-ups on file.

## 2019-11-26 ENCOUNTER — Encounter: Payer: Self-pay | Admitting: Neurology

## 2019-11-26 ENCOUNTER — Other Ambulatory Visit: Payer: Self-pay

## 2019-11-26 ENCOUNTER — Ambulatory Visit: Payer: Medicare HMO | Admitting: Neurology

## 2019-11-26 ENCOUNTER — Encounter: Payer: Self-pay | Admitting: Internal Medicine

## 2019-11-26 DIAGNOSIS — G250 Essential tremor: Secondary | ICD-10-CM | POA: Diagnosis not present

## 2019-11-30 DIAGNOSIS — G4733 Obstructive sleep apnea (adult) (pediatric): Secondary | ICD-10-CM | POA: Diagnosis not present

## 2019-12-03 ENCOUNTER — Ambulatory Visit: Payer: Medicare HMO | Admitting: Allergy & Immunology

## 2019-12-09 ENCOUNTER — Ambulatory Visit: Payer: Medicare HMO | Admitting: Podiatry

## 2019-12-17 ENCOUNTER — Ambulatory Visit: Payer: Medicare HMO | Admitting: Allergy & Immunology

## 2019-12-18 ENCOUNTER — Encounter: Payer: Self-pay | Admitting: Podiatry

## 2019-12-18 ENCOUNTER — Ambulatory Visit: Payer: Medicare HMO | Admitting: Podiatry

## 2019-12-18 ENCOUNTER — Other Ambulatory Visit: Payer: Self-pay

## 2019-12-18 DIAGNOSIS — M79674 Pain in right toe(s): Secondary | ICD-10-CM | POA: Diagnosis not present

## 2019-12-18 DIAGNOSIS — B351 Tinea unguium: Secondary | ICD-10-CM | POA: Diagnosis not present

## 2019-12-18 DIAGNOSIS — M79675 Pain in left toe(s): Secondary | ICD-10-CM

## 2019-12-18 NOTE — Progress Notes (Signed)
This patient returns to the office for evaluation and treatment of long thick painful nails .  This patient is unable to trim his own nails since the patient cannot reach his feet.  Patient says the nails are painful walking and wearing his shoes.  He returns for preventive foot care services. Patient had surgery by Dr.  March Rummage.  General Appearance  Alert, conversant and in no acute stress.  Vascular  Dorsalis pedis and posterior tibial  pulses are palpable  bilaterally.  Capillary return is within normal limits  bilaterally. Temperature is within normal limits  bilaterally.  Neurologic  Senn-Weinstein monofilament wire test within normal limits  bilaterally. Muscle power within normal limits bilaterally.  Nails Thick disfigured discolored nails with subungual debris  from hallux to fifth toes bilaterally.  Hallux nails are especially thickened and split and painful   No evidence of bacterial infection or drainage bilaterally.  Orthopedic  No limitations of motion  feet .  No crepitus or effusions noted.  No bony pathology or digital deformities noted.  Skin  normotropic skin with no porokeratosis noted bilaterally.  No signs of infections or ulcers noted.     Onychomycosis  Pain in toes right foot  Pain in toes left foot  Debridement  of nails  1-5  B/L with a nail nipper.  Nails were then filed using a dremel tool with no incidents.    RTC 3 months    Gardiner Barefoot DPM

## 2019-12-20 DIAGNOSIS — J432 Centrilobular emphysema: Secondary | ICD-10-CM | POA: Diagnosis not present

## 2019-12-20 DIAGNOSIS — G4733 Obstructive sleep apnea (adult) (pediatric): Secondary | ICD-10-CM | POA: Diagnosis not present

## 2019-12-20 DIAGNOSIS — I5032 Chronic diastolic (congestive) heart failure: Secondary | ICD-10-CM | POA: Diagnosis not present

## 2019-12-20 DIAGNOSIS — S86311A Strain of muscle(s) and tendon(s) of peroneal muscle group at lower leg level, right leg, initial encounter: Secondary | ICD-10-CM | POA: Diagnosis not present

## 2019-12-20 DIAGNOSIS — S86311D Strain of muscle(s) and tendon(s) of peroneal muscle group at lower leg level, right leg, subsequent encounter: Secondary | ICD-10-CM | POA: Diagnosis not present

## 2019-12-26 DIAGNOSIS — R69 Illness, unspecified: Secondary | ICD-10-CM | POA: Diagnosis not present

## 2019-12-26 DIAGNOSIS — F5105 Insomnia due to other mental disorder: Secondary | ICD-10-CM | POA: Diagnosis not present

## 2019-12-30 ENCOUNTER — Other Ambulatory Visit: Payer: Self-pay

## 2019-12-30 ENCOUNTER — Ambulatory Visit: Payer: Medicare HMO | Admitting: Pulmonary Disease

## 2019-12-30 ENCOUNTER — Encounter: Payer: Self-pay | Admitting: Pulmonary Disease

## 2019-12-30 VITALS — BP 122/60 | HR 85 | Temp 98.7°F | Ht 70.0 in | Wt 250.0 lb

## 2019-12-30 DIAGNOSIS — J449 Chronic obstructive pulmonary disease, unspecified: Secondary | ICD-10-CM | POA: Diagnosis not present

## 2019-12-30 DIAGNOSIS — R918 Other nonspecific abnormal finding of lung field: Secondary | ICD-10-CM | POA: Diagnosis not present

## 2019-12-30 DIAGNOSIS — J432 Centrilobular emphysema: Secondary | ICD-10-CM | POA: Diagnosis not present

## 2019-12-30 MED ORDER — TRELEGY ELLIPTA 100-62.5-25 MCG/INH IN AEPB
1.0000 | INHALATION_SPRAY | Freq: Every day | RESPIRATORY_TRACT | 0 refills | Status: DC
Start: 2019-12-30 — End: 2020-06-08

## 2019-12-30 NOTE — Addendum Note (Signed)
Addended by: Mathis Bud on: 12/30/2019 10:44 AM   Modules accepted: Orders

## 2019-12-30 NOTE — Progress Notes (Signed)
[name]  Chief Complaint  Patient presents with  . Follow-up    Wears oxygen 2L bedtime. Sob-same    Constitutional:  BP 122/60 (BP Location: Left Arm)   Pulse 85   Temp 98.7 F (37.1 C) (Temporal)   Ht 5\' 10"  (1.778 m)   Wt 250 lb (113.4 kg)   SpO2 96%   BMI 35.87 kg/m   Past Medical History:  Carpal tunnel, GERD, Tremor, ED, Cataracts, Obstructive sleep apnea intolerant of CPAP/Bipap  Past Surgical History:  His  has a past surgical history that includes Hernia repair (Right, 2007); Cataract extraction (Bilateral, 2020); Vein Surgery (Bilateral, 2009); Rotator cuff repair (Right, 2014); Appendectomy (2015); Total shoulder replacement (Left, 2016); Deep brain stimulator placement (Left, 2016); Carpal tunnel release (Left, 2019); Tonsillectomy; and Adenoidectomy.  Brief Summary:  Allen [name] is a 69 y.o. male former smoker with asthma, emphysema, lung nodule, and sleep related hypoxia.      Subjective:   He joined [employer].  Has lost 17 lbs since his last visit.  Seen by psychiatry.  Started on lexapro and seroquel.  Mood better and sleeping better.  Breathing okay.  Not having cough, wheeze, or sputum.  Still feels like his energy level is low.  He is using 2 liters oxygen at night.  He wakes up at 4 am to use bathroom and then sleeps another 2 hours w/o oxygen on.  Physical Exam:   Appearance - well kempt   ENMT - no sinus tenderness, no oral exudate, no LAN, Mallampati 3 airway, no stridor  Respiratory - equal breath sounds bilaterally, no wheezing or rales  CV - s1s2 regular rate and rhythm, no murmurs  Ext - no clubbing, no edema  Skin - no rashes  Psych - normal mood and affect   Pulmonary testing:   PFT 12/21/16 >> FEV1 2.40 (69%), FEV1% 74, TLC 4.95 (73%), DLCO 77%, +BD  IgE 04/10/19 >> 66  PFT 04/20/19 >> FEV1 2.09 (62%), FEV1% 74, TLC 5.33 (75%), DLCO 93%  Chest Imaging:   CT chest 05/04/19 >>  centrilobular and paraseptal emphysema, 3 mm nodule RUL, 4 mm nodule RML, 3 mm nodule lingula  Sleep Tests:   HST 04/20/15 >> AHI 40  Auto Bipap 05/21/19 to 06/01/19 >> used on 11 of 12 nights with average 6 hrs 50 min.  Average AHI 9.1 with median Bipap 11/7 and 95 th percentile Bipap 12/8 cm H2O.  ONO with RA 11/17/19 >> test time 2 hrs 42 min.  Baseline SpO2 88%, low SpO2 82%.  Spent 1 hr 27 min with SpO2 < 88%.  Cardiac Tests:   Echo 05/29/19 >> EF 60 to 65%, grade 1 DD, mod LA dilation  Social History:  He  reports that he quit smoking about 30 years ago. His smoking use included cigarettes. He has a 60.00 pack-year smoking history. He has quit using smokeless tobacco. He reports current alcohol use of about 14.0 standard drinks of alcohol per week. He reports that he does not use drugs.  Family History:  His family history includes Asthma in his father; COPD in his mother; Healthy in his child; Rectal cancer in his sister.     Assessment/Plan:   COPD with emphysema and asthma. - breztri and symbicort were ineffective and too expensive - continue trelegy 100, singulair - samples of trelegy provider - continue singulair - prn albuterol  Chronic respiratory failure with hypoxia. - from sleep related hypoventilation and  emphysema - discussed how weight loss should help with this; his goal weight is 205 lbs - continue 2 liters oxygen at night  Lung nodules. - he will need follow up non contrast CT chest in April 2022  Perennial allergic rhinitis with nonallergic component. - followed by Dr. Salvatore Marvel with [facility]  Tremor. - followed by Dr. Wells Guiles Tat with [facility]  Time Spent Involved in Patient Care on Day of Examination:  22 minutes  Follow up:  Patient Instructions  Follow up in April 2022 after you have your CT chest scan   Medication List:   Allergies as of 12/30/2019   No Known Allergies     Medication List       Accurate as  of December 30, 2019  9:55 AM. If you have any questions, ask your nurse or doctor.        albuterol (2.5 MG/3ML) 0.083% nebulizer solution Commonly known as: PROVENTIL Take 3 mLs (2.5 mg total) by nebulization every 6 (six) hours.   albuterol 108 (90 Base) MCG/ACT inhaler Commonly known as: VENTOLIN HFA Inhale 2 puffs into the lungs every 4 (four) hours as needed for wheezing or shortness of breath.   alfuzosin 10 MG 24 hr tablet Commonly known as: UROXATRAL Take 10 mg by mouth daily.   azelastine 0.1 % nasal spray Commonly known as: ASTELIN Place 1-2 sprays into both nostrils 2 (two) times daily as needed for rhinitis.   Azelastine-Fluticasone 137-50 MCG/ACT Susp Place 1 spray into both nostrils 2 (two) times daily as needed.   benzonatate 100 MG capsule Commonly known as: TESSALON TAKE 1 CAPSULE (100 MG TOTAL) BY MOUTH 2 (TWO) TIMES DAILY AS NEEDED FOR COUGH.   escitalopram 10 MG tablet Commonly known as: LEXAPRO Take 10 mg by mouth daily.   fluticasone 50 MCG/ACT nasal spray Commonly known as: Flonase Place 2 sprays into both nostrils daily as needed for allergies or rhinitis.   montelukast 10 MG tablet Commonly known as: SINGULAIR Take 1 tablet (10 mg total) by mouth at bedtime.   QUEtiapine 25 MG tablet Commonly known as: SEROQUEL Take 25 mg by mouth at bedtime.   traZODone 100 MG tablet Commonly known as: DESYREL Take 200 mg by mouth at bedtime.   Trelegy Ellipta 100-62.5-25 MCG/INH Aepb Generic drug: Fluticasone-Umeclidin-Vilant Inhale 1 puff into the lungs daily.       Signature:  Chesley Mires, MD [facility] Pager - [phone] 12/30/2019, 9:55 AM

## 2019-12-30 NOTE — Addendum Note (Signed)
Addended by: Mathis Bud on: 12/30/2019 12:59 PM   Modules accepted: Orders

## 2019-12-30 NOTE — Patient Instructions (Signed)
Follow up in April 2022 after you have your CT chest scan

## 2019-12-31 ENCOUNTER — Ambulatory Visit (INDEPENDENT_AMBULATORY_CARE_PROVIDER_SITE_OTHER): Payer: Medicare HMO | Admitting: Internal Medicine

## 2019-12-31 VITALS — BP 118/72 | HR 79 | Temp 98.2°F | Ht 70.0 in | Wt 250.6 lb

## 2019-12-31 DIAGNOSIS — G4733 Obstructive sleep apnea (adult) (pediatric): Secondary | ICD-10-CM

## 2019-12-31 DIAGNOSIS — R69 Illness, unspecified: Secondary | ICD-10-CM | POA: Diagnosis not present

## 2019-12-31 DIAGNOSIS — Z800 Family history of malignant neoplasm of digestive organs: Secondary | ICD-10-CM | POA: Diagnosis not present

## 2019-12-31 DIAGNOSIS — J418 Mixed simple and mucopurulent chronic bronchitis: Secondary | ICD-10-CM | POA: Diagnosis not present

## 2019-12-31 DIAGNOSIS — N529 Male erectile dysfunction, unspecified: Secondary | ICD-10-CM

## 2019-12-31 DIAGNOSIS — E559 Vitamin D deficiency, unspecified: Secondary | ICD-10-CM

## 2019-12-31 DIAGNOSIS — G4700 Insomnia, unspecified: Secondary | ICD-10-CM

## 2019-12-31 DIAGNOSIS — F411 Generalized anxiety disorder: Secondary | ICD-10-CM

## 2019-12-31 DIAGNOSIS — K219 Gastro-esophageal reflux disease without esophagitis: Secondary | ICD-10-CM | POA: Diagnosis not present

## 2019-12-31 DIAGNOSIS — Z86010 Personal history of colonic polyps: Secondary | ICD-10-CM | POA: Diagnosis not present

## 2019-12-31 MED ORDER — MONTELUKAST SODIUM 10 MG PO TABS: 10.0000 mg | ORAL_TABLET | Freq: Every day | ORAL | 1 refills | Status: DC

## 2019-12-31 MED ORDER — TADALAFIL 20 MG PO TABS: 20.0000 mg | ORAL_TABLET | Freq: Every day | ORAL | 3 refills | Status: DC | PRN

## 2019-12-31 NOTE — Procedures (Signed)
DBS Programming was performed.  Pt in to determine battery life on DBS.  Suspecting near end of life.  Manufacturer of DBS device: Medtronic   Device was confirmed to be on.  Soft start was confirmed to be on.  Impedences were checked and were within normal limits (oth therapy and all electrode).  Battery was checked and was determined to be functioning normally but close to near the end of life. Contacted Dr. Vertell Limber personally.   Final settings were as follows:   Active Contact Amplitude (V) PW (ms) Frequency (hz) Side Effects Battery  Left Brain        Group A        11/26/19 2-C+ 2.1 60 150    01/04/20 2-C+ 2.1 60 150            Group B (active)        11/26/19 2-0+ 2.9 90 170  2.63  01/04/20 2-0+ 2.9 90 170  2.56          Right Brain        n/a

## 2019-12-31 NOTE — Progress Notes (Signed)
Established Patient Office Visit     This visit occurred during the SARS-CoV-2 public health emergency.  Safety protocols were in place, including screening questions prior to the visit, additional usage of staff PPE, and extensive cleaning of exam room while observing appropriate contact time as indicated for disinfecting solutions.    CC/Reason for Visit: Follow-up chronic medical conditions  HPI: Allen [name] is a 69 y.o. male who is coming in today for the above mentioned reasons. Past Medical History is significant for: Essential tremor with a brain stimulator followed by Dr. Carles Collet.  He tells me that his battery is due for exchange soon.  He has a history of COPD on Singulair and Trelegy, he is followed by pulmonary.  Also history of obstructive sleep apnea currently using oxygen at night, no longer on BiPAP.  History of GERD that has caused vocal cord dysfunction, on daily PPI therapy.  In the past 18 months he has had foot surgery, cataract surgery, right carpal tunnel surgery.  He was noted to have an elevated PSA and was referred to urology.  Biopsies were negative and plan currently is to follow PSA trend.  He saw a psychiatrist due to depression and sleeping issues.  Last week started taking Lexapro and Seroquel.  Sleep is much improved, he feels like mood might be better as well.  He also has a history of erectile dysfunction and is requesting refills of his Cialis.  His sister has rectal cancer, he has had precancerous polyps in the past.  His gastroenterologist in [place] had him on an every 3-year schedule and his last colonoscopy was in 2018, he is requesting GI referral today.  Since I last saw him he has had his Covid booster as well as his flu vaccine.  He has joined [employer], has started working out at [employer], he has lost 18 pounds.   Past Medical/Surgical History: Past Medical History:  Diagnosis Date  . Asthma   . Bilateral cataracts   . COPD (chronic obstructive  pulmonary disease) ([place])   . ED (erectile dysfunction)   . Essential tremor    with nerve stimulator  . GERD (gastroesophageal reflux disease)   . Insomnia   . OSA treated with BiPAP   . Recurrent upper respiratory infection (URI)   . Right carpal tunnel syndrome     Past Surgical History:  Procedure Laterality Date  . ADENOIDECTOMY    . APPENDECTOMY  2015  . CARPAL TUNNEL RELEASE Left 2019  . CATARACT EXTRACTION Bilateral 2020  . DEEP BRAIN STIMULATOR PLACEMENT Left 2016  . HERNIA REPAIR Right 2007  . ROTATOR CUFF REPAIR Right 2014  . TONSILLECTOMY    . TOTAL SHOULDER REPLACEMENT Left 2016  . VEIN SURGERY Bilateral 2009    Social History:  reports that he quit smoking about 30 years ago. His smoking use included cigarettes. He has a 60.00 pack-year smoking history. He has quit using smokeless tobacco. He reports current alcohol use of about 14.0 standard drinks of alcohol per week. He reports that he does not use drugs.  Allergies: No Known Allergies  Family History:  Family History  Problem Relation Age of Onset  . COPD Mother   . Asthma Father   . Rectal cancer Sister   . Healthy Child   . Urticaria Neg Hx   . Immunodeficiency Neg Hx   . Eczema Neg Hx   . Atopy Neg Hx   . Angioedema Neg Hx   .  Allergic rhinitis Neg Hx      Current Outpatient Medications:  .  albuterol (PROVENTIL) (2.5 MG/3ML) 0.083% nebulizer solution, Take 3 mLs (2.5 mg total) by nebulization every 6 (six) hours., Disp: 150 mL, Rfl: 3 .  albuterol (VENTOLIN HFA) 108 (90 Base) MCG/ACT inhaler, Inhale 2 puffs into the lungs every 4 (four) hours as needed for wheezing or shortness of breath., Disp: 18 g, Rfl: 1 .  alfuzosin (UROXATRAL) 10 MG 24 hr tablet, Take 10 mg by mouth daily., Disp: , Rfl:  .  azelastine (ASTELIN) 0.1 % nasal spray, Place 1-2 sprays into both nostrils 2 (two) times daily as needed for rhinitis., Disp: 30 mL, Rfl: 5 .  Azelastine-Fluticasone 137-50 MCG/ACT SUSP, Place 1 spray  into both nostrils 2 (two) times daily as needed., Disp: 23 g, Rfl: 5 .  benzonatate (TESSALON) 100 MG capsule, TAKE 1 CAPSULE (100 MG TOTAL) BY MOUTH 2 (TWO) TIMES DAILY AS NEEDED FOR COUGH., Disp: 30 capsule, Rfl: 0 .  escitalopram (LEXAPRO) 10 MG tablet, Take 10 mg by mouth daily., Disp: , Rfl:  .  fluticasone (FLONASE) 50 MCG/ACT nasal spray, Place 2 sprays into both nostrils daily as needed for allergies or rhinitis., Disp: 16 g, Rfl: 5 .  Fluticasone-Umeclidin-Vilant (TRELEGY ELLIPTA) 100-62.5-25 MCG/INH AEPB, Inhale 1 puff into the lungs daily., Disp: 28 each, Rfl: 0 .  montelukast (SINGULAIR) 10 MG tablet, Take 1 tablet (10 mg total) by mouth at bedtime., Disp: 90 tablet, Rfl: 1 .  QUEtiapine (SEROQUEL) 25 MG tablet, Take 25 mg by mouth at bedtime., Disp: , Rfl:  .  traZODone (DESYREL) 100 MG tablet, Take 200 mg by mouth at bedtime., Disp: , Rfl:  .  tadalafil (CIALIS) 20 MG tablet, Take 1 tablet (20 mg total) by mouth daily as needed for erectile dysfunction., Disp: 90 tablet, Rfl: 3  Review of Systems:  Constitutional: Denies fever, chills, diaphoresis, appetite change and fatigue.  HEENT: Denies photophobia, eye pain, redness, hearing loss, ear pain, congestion, sore throat, rhinorrhea, sneezing, mouth sores, trouble swallowing, neck pain, neck stiffness and tinnitus.   Respiratory: Denies SOB, DOE, cough, chest tightness,  and wheezing.   Cardiovascular: Denies chest pain, palpitations and leg swelling.  Gastrointestinal: Denies nausea, vomiting, abdominal pain, diarrhea, constipation, blood in stool and abdominal distention.  Genitourinary: Denies dysuria, urgency, frequency, hematuria, flank pain and difficulty urinating.  Endocrine: Denies: hot or cold intolerance, sweats, changes in hair or nails, polyuria, polydipsia. Musculoskeletal: Denies myalgias, back pain, joint swelling, arthralgias and gait problem.  Skin: Denies pallor, rash and wound.  Neurological: Denies dizziness,  seizures, syncope, weakness, light-headedness, numbness and headaches.  Hematological: Denies adenopathy. Easy bruising, personal or family bleeding history  Psychiatric/Behavioral: Denies suicidal ideation, mood changes, confusion, nervousness, sleep disturbance and agitation    Physical Exam: Vitals:   12/31/19 1020  BP: 118/72  Pulse: 79  Temp: 98.2 F (36.8 C)  TempSrc: Oral  SpO2: 96%  Weight: 250 lb 9.6 oz (113.7 kg)  Height: 5\' 10"  (1.778 m)    Body mass index is 35.96 kg/m.   Constitutional: NAD, calm, comfortable Eyes: PERRL, lids and conjunctivae normal, wears corrective lenses ENMT: Mucous membranes are moist.  Respiratory: clear to auscultation bilaterally, no wheezing, no crackles. Normal respiratory effort. No accessory muscle use.  Cardiovascular: Regular rate and rhythm, no murmurs / rubs / gallops. No extremity edema.  Neurologic: Grossly intact and nonfocal Psychiatric: Normal judgment and insight. Alert and oriented x 3. Normal mood.    Impression  and Plan:  Family history of rectal cancer  Hx of colonic polyp  - Plan: Ambulatory referral to Gastroenterology  OSA treated with BiPAP -No longer on BiPAP, followed by pulmonary, nighttime oxygen has been added.  Mixed simple and mucopurulent chronic bronchitis (HCC) -Followed by pulmonary on Singulair, Trelegy and as needed albuterol.  Gastroesophageal reflux disease without esophagitis -On daily PPI therapy  Erectile dysfunction, unspecified erectile dysfunction type -Cialis prescription given today.  GAD (generalized anxiety disorder) Insomnia, unspecified type -Followed by psychiatry with recent addition of Lexapro and Seroquel.  Vitamin D deficiency  - Plan: VITAMIN D 25 Hydroxy (Vit-D Deficiency, Fractures)   Patient Instructions  -Nice seeing you today!!  -Lab work today; will notify you once results are available.  -Schedule follow up in 6 months for your physical. Please come in  fasting that day.     Lelon Frohlich, MD [facility]

## 2019-12-31 NOTE — Patient Instructions (Signed)
-  Nice seeing you today!!  -Lab work today; will notify you once results are available.  -Schedule follow up in 6 months for your physical. Please come in fasting that day. 

## 2020-01-01 ENCOUNTER — Other Ambulatory Visit: Payer: Self-pay | Admitting: Internal Medicine

## 2020-01-01 DIAGNOSIS — E559 Vitamin D deficiency, unspecified: Secondary | ICD-10-CM

## 2020-01-01 LAB — VITAMIN D 25 HYDROXY (VIT D DEFICIENCY, FRACTURES): Vit D, 25-Hydroxy: 34 ng/mL (ref 30–100)

## 2020-01-01 MED ORDER — VITAMIN D (ERGOCALCIFEROL) 1.25 MG (50000 UNIT) PO CAPS
50000.0000 [IU] | ORAL_CAPSULE | ORAL | 0 refills | Status: DC
Start: 2020-01-01 — End: 2020-03-17

## 2020-01-04 ENCOUNTER — Encounter: Payer: Self-pay | Admitting: Neurology

## 2020-01-04 ENCOUNTER — Ambulatory Visit: Payer: Medicare HMO | Admitting: Neurology

## 2020-01-04 ENCOUNTER — Other Ambulatory Visit: Payer: Self-pay

## 2020-01-04 VITALS — BP 110/68 | HR 68 | Ht 70.0 in | Wt 249.0 lb

## 2020-01-04 DIAGNOSIS — Z4542 Encounter for adjustment and management of neuropacemaker (brain) (peripheral nerve) (spinal cord): Secondary | ICD-10-CM

## 2020-01-04 DIAGNOSIS — G250 Essential tremor: Secondary | ICD-10-CM | POA: Diagnosis not present

## 2020-01-05 ENCOUNTER — Other Ambulatory Visit: Payer: Self-pay | Admitting: Allergy and Immunology

## 2020-01-05 ENCOUNTER — Telehealth: Payer: Self-pay

## 2020-01-05 NOTE — Telephone Encounter (Signed)
Received fax stating patient has an appointment.    Referral  Type of referral: Neurusurgery  Provider Office/ Name:  Dr Vertell Limber at [facility]   Phone:  [phone]  Fax:  [phone]  Address:  [address], [place] [address]  Appointment Date and Time:  01/20/2020 at 2:30pm

## 2020-01-19 NOTE — Progress Notes (Unsigned)
[facility] [address] [place] [address] Phone: [phone] Subjective:   Bruce Donath, am serving as a scribe for Dr. Antoine Primas. This visit occurred during the SARS-CoV-2 public health emergency.  Safety protocols were in place, including screening questions prior to the visit, additional usage of staff PPE, and extensive cleaning of exam room while observing appropriate contact time as indicated for disinfecting solutions.   I'm seeing this patient by the request  of:  Philip Aspen, [name], MD  CC: Back and neck pain follow-up  ION:[id number]  Allen [name] is a 70 y.o. male coming in with complaint of back and neck pain. OMT 11/18/2019. Patient states that his neck pain is getting worse. Pain on left side of cervical spine that is not radiating down into arm. Does want to try dry needling as his wife gets that treatment and finds it helpful.  Patient is attempting to lose weight and has lost over 20 pounds in the last month.  Feels like it may have helped his lower back but not helping his neck.  Neck seems to be worse significantly at night.  Which she could have some success with it.  Have tried gabapentin with no significant benefit.           Reviewed prior external information including notes and imaging from previsou exam, outside providers and external EMR if available.   As well as notes that were available from care everywhere and other healthcare systems.  Past medical history, social, surgical and family history all reviewed in electronic medical record.  No pertanent information unless stated regarding to the chief complaint.   Past Medical History:  Diagnosis Date  . Asthma   . Bilateral cataracts   . COPD (chronic obstructive pulmonary disease) (HCC)   . ED (erectile dysfunction)   . Essential tremor    with nerve stimulator  . GERD (gastroesophageal reflux disease)   . Insomnia   . OSA treated with BiPAP    . Recurrent upper respiratory infection (URI)   . Right carpal tunnel syndrome     No Known Allergies   Review of Systems:  No headache, visual changes, nausea, vomiting, diarrhea, constipation, dizziness, abdominal pain, skin rash, fevers, chills, night sweats, weight loss, swollen lymph nodes, body aches, joint swelling, chest pain, shortness of breath, mood changes. POSITIVE muscle aches  Objective  Blood pressure 110/70, pulse 90, height 5\' 10"  (1.778 m), weight 249 lb (112.9 kg), SpO2 96 %.   General: No apparent distress alert and oriented x3 mood and affect normal, dressed appropriately.  HEENT: Pupils equal, extraocular movements intact  Respiratory: Patient's speak in full sentences and does not appear short of breath  Cardiovascular: No lower extremity edema, non tender, no erythema  Neuro: Cranial nerves II through XII are intact, neurovascularly intact in all extremities with 2+ DTRs and 2+ pulses.  Gait normal with good balance and coordination.  MSK:  Non tender with full range of motion and good stability and symmetric strength and tone of shoulders, elbows, wrist, hip, knee and ankles bilaterally.  Back -low back exam does have some mild loss of lordosis.  Some tightness noted with FABER test bilaterally.  Loss of extension in the back by 10 degrees. Neck exam does have some loss of lordosis as well.  Limited sidebending.  Crepitus noted.  5 out of 5 strength of the upper extremities.  Osteopathic findings  C2 flexed rotated and side bent right C6  flexed rotated and side bent left T3 extended rotated and side bent right inhaled rib T9 extended rotated and side bent left L2 flexed rotated and side bent right Sacrum right on right       Assessment and Plan:  Degenerative disc disease, cervical Severe degenerative disc disease.  Discussed with patient again at great length.  Patient does have chronic problems but is losing weight and he does think that this is  helping potentially his neck and back.  Neck pain seems to be worse at night and we discussed different pillow options as well as positioning.  Can consider the possibility of a muscle relaxer at night the patient is on multiple different medications and will decide at follow-up.  Patient will start the with formal physical therapy and dry needling which I think will be beneficial.  Patient will increase activity slowly.  Follow-up with me again 8 weeks    Nonallopathic problems  Decision today to treat with OMT was based on Physical Exam  After verbal consent patient was treated with HVLA, ME, FPR techniques in cervical, rib, thoracic, lumbar, and sacral  areas avoided HVLA on neck and did more muscle energy today secondary to tightness  Patient tolerated the procedure well with improvement in symptoms  Patient given exercises, stretches and lifestyle modifications  See medications in patient instructions if given  Patient will follow up in 8 weeks      The above documentation has been reviewed and is accurate and complete Lyndal Pulley, DO       Note: This dictation was prepared with Dragon dictation along with smaller phrase technology. Any transcriptional errors that result from this process are unintentional.

## 2020-01-20 ENCOUNTER — Encounter (HOSPITAL_COMMUNITY): Payer: Self-pay | Admitting: Neurosurgery

## 2020-01-20 ENCOUNTER — Ambulatory Visit: Payer: Medicare HMO | Admitting: Family Medicine

## 2020-01-20 ENCOUNTER — Other Ambulatory Visit: Payer: Self-pay

## 2020-01-20 ENCOUNTER — Other Ambulatory Visit: Payer: Self-pay | Admitting: Neurosurgery

## 2020-01-20 ENCOUNTER — Encounter: Payer: Self-pay | Admitting: Family Medicine

## 2020-01-20 VITALS — BP 110/70 | HR 90 | Ht 70.0 in | Wt 249.0 lb

## 2020-01-20 DIAGNOSIS — S86311D Strain of muscle(s) and tendon(s) of peroneal muscle group at lower leg level, right leg, subsequent encounter: Secondary | ICD-10-CM | POA: Diagnosis not present

## 2020-01-20 DIAGNOSIS — M999 Biomechanical lesion, unspecified: Secondary | ICD-10-CM

## 2020-01-20 DIAGNOSIS — G4733 Obstructive sleep apnea (adult) (pediatric): Secondary | ICD-10-CM | POA: Diagnosis not present

## 2020-01-20 DIAGNOSIS — Z9689 Presence of other specified functional implants: Secondary | ICD-10-CM | POA: Diagnosis not present

## 2020-01-20 DIAGNOSIS — I5032 Chronic diastolic (congestive) heart failure: Secondary | ICD-10-CM | POA: Diagnosis not present

## 2020-01-20 DIAGNOSIS — J432 Centrilobular emphysema: Secondary | ICD-10-CM | POA: Diagnosis not present

## 2020-01-20 DIAGNOSIS — S86311A Strain of muscle(s) and tendon(s) of peroneal muscle group at lower leg level, right leg, initial encounter: Secondary | ICD-10-CM | POA: Diagnosis not present

## 2020-01-20 DIAGNOSIS — R251 Tremor, unspecified: Secondary | ICD-10-CM | POA: Diagnosis not present

## 2020-01-20 DIAGNOSIS — M5030 Other cervical disc degeneration, unspecified cervical region: Secondary | ICD-10-CM

## 2020-01-20 NOTE — Assessment & Plan Note (Signed)
Severe degenerative disc disease.  Discussed with patient again at great length.  Patient does have chronic problems but is losing weight and he does think that this is helping potentially his neck and back.  Neck pain seems to be worse at night and we discussed different pillow options as well as positioning.  Can consider the possibility of a muscle relaxer at night the patient is on multiple different medications and will decide at follow-up.  Patient will start the with formal physical therapy and dry needling which I think will be beneficial.  Patient will increase activity slowly.  Follow-up with me again 8 weeks

## 2020-01-20 NOTE — Patient Instructions (Addendum)
Dry needling and PT will be great for you Capsasin cream to neck at night See me in 7-8 weeks

## 2020-01-20 NOTE — Progress Notes (Signed)
Patient denies shortness of breath, fever, cough or chest pain.  PCP - Dr Philip Aspen Cardiologist - n/a Neuro - Kerin Salen, DO  Pulmonology - Dr Coralyn Helling  Chest x-ray - 04/10/19 (2V) EKG - 05/26/19 Stress Test - In [place] - Negative per patient ECHO - 05/29/19 Cardiac Cath - In [place] - Negative per patient  Sleep Study -  Yes Does not use BiPaP.  Uses 2L oxgen via [place] while sleeping.  STOP now taking any Aspirin (unless otherwise instructed by your surgeon), Aleve, Naproxen, Ibuprofen, Motrin, Advil, Goody's, BC's, all herbal medications, fish oil, and all vitamins.   Coronavirus Screening Covid test scheduled on DOS 01/21/20 Do you have any of the following symptoms:  Cough yes/no: No Fever (>100.36F)  yes/no: No Runny nose yes/no: No Sore throat yes/no: No Difficulty breathing/shortness of breath  yes/no: No  Have you traveled in the last 14 days and where? yes/no: No  Patient verbalized understanding of instructions that were given via phone.

## 2020-01-21 ENCOUNTER — Encounter: Payer: Self-pay | Admitting: Internal Medicine

## 2020-01-21 ENCOUNTER — Ambulatory Visit (HOSPITAL_COMMUNITY)
Admission: RE | Admit: 2020-01-21 | Discharge: 2020-01-21 | Disposition: A | Discharge status: Home / Self Care | Payer: Medicare HMO | Attending: Neurosurgery | Admitting: Neurosurgery

## 2020-01-21 ENCOUNTER — Ambulatory Visit (HOSPITAL_COMMUNITY): Payer: Medicare HMO | Admitting: Certified Registered Nurse Anesthetist

## 2020-01-21 ENCOUNTER — Encounter (HOSPITAL_COMMUNITY): Payer: Self-pay | Admitting: Neurosurgery

## 2020-01-21 ENCOUNTER — Other Ambulatory Visit: Payer: Self-pay

## 2020-01-21 ENCOUNTER — Encounter (HOSPITAL_COMMUNITY)
Admission: RE | Disposition: A | Discharge status: Home / Self Care | Payer: Self-pay | Source: Home / Self Care | Attending: Neurosurgery

## 2020-01-21 DIAGNOSIS — Z87891 Personal history of nicotine dependence: Secondary | ICD-10-CM | POA: Insufficient documentation

## 2020-01-21 DIAGNOSIS — Z79899 Other long term (current) drug therapy: Secondary | ICD-10-CM | POA: Insufficient documentation

## 2020-01-21 DIAGNOSIS — R251 Tremor, unspecified: Secondary | ICD-10-CM | POA: Diagnosis not present

## 2020-01-21 DIAGNOSIS — E559 Vitamin D deficiency, unspecified: Secondary | ICD-10-CM | POA: Diagnosis not present

## 2020-01-21 DIAGNOSIS — Z20822 Contact with and (suspected) exposure to covid-19: Secondary | ICD-10-CM | POA: Insufficient documentation

## 2020-01-21 DIAGNOSIS — G20 Parkinson's disease: Secondary | ICD-10-CM | POA: Insufficient documentation

## 2020-01-21 DIAGNOSIS — J449 Chronic obstructive pulmonary disease, unspecified: Secondary | ICD-10-CM | POA: Diagnosis not present

## 2020-01-21 DIAGNOSIS — J309 Allergic rhinitis, unspecified: Secondary | ICD-10-CM | POA: Diagnosis not present

## 2020-01-21 DIAGNOSIS — G520 Disorders of olfactory nerve: Secondary | ICD-10-CM | POA: Diagnosis not present

## 2020-01-21 DIAGNOSIS — Z4542 Encounter for adjustment and management of neuropacemaker (brain) (peripheral nerve) (spinal cord): Secondary | ICD-10-CM | POA: Insufficient documentation

## 2020-01-21 DIAGNOSIS — T85190A Other mechanical complication of implanted electronic neurostimulator (electrode) of brain, initial encounter: Secondary | ICD-10-CM | POA: Diagnosis not present

## 2020-01-21 HISTORY — DX: Anxiety disorder, unspecified: F41.9

## 2020-01-21 HISTORY — DX: Unspecified osteoarthritis, unspecified site: M19.90

## 2020-01-21 HISTORY — DX: Dyspnea, unspecified: R06.00

## 2020-01-21 HISTORY — PX: SUBTHALAMIC STIMULATOR BATTERY REPLACEMENT: SHX5405

## 2020-01-21 LAB — SARS CORONAVIRUS 2 BY RT PCR (HOSPITAL ORDER, PERFORMED IN ~~LOC~~ HOSPITAL LAB): SARS Coronavirus 2: NEGATIVE

## 2020-01-21 SURGERY — SUBTHALAMIC STIMULATOR BATTERY REPLACEMENT
Anesthesia: Monitor Anesthesia Care | Site: Chest

## 2020-01-21 MED ORDER — FENTANYL CITRATE (PF) 250 MCG/5ML IJ SOLN
INTRAMUSCULAR | Status: DC | PRN
  Administered 2020-01-21 (×2): 25 ug via INTRAVENOUS

## 2020-01-21 MED ORDER — BACITRACIN ZINC 500 UNIT/GM EX OINT
TOPICAL_OINTMENT | CUTANEOUS | Status: AC
  Filled 2020-01-21: qty 28.35

## 2020-01-21 MED ORDER — FENTANYL CITRATE (PF) 100 MCG/2ML IJ SOLN
25.0000 ug | INTRAMUSCULAR | Status: DC | PRN
  Administered 2020-01-21: 50 ug via INTRAVENOUS

## 2020-01-21 MED ORDER — 0.9 % SODIUM CHLORIDE (POUR BTL) OPTIME
TOPICAL | Status: DC | PRN
  Administered 2020-01-21: 1000 mL

## 2020-01-21 MED ORDER — CHLORHEXIDINE GLUCONATE CLOTH 2 % EX PADS: 6.0000 | MEDICATED_PAD | Freq: Once | CUTANEOUS | Status: DC

## 2020-01-21 MED ORDER — ACETAMINOPHEN 500 MG PO TABS
1000.0000 mg | ORAL_TABLET | Freq: Once | ORAL | Status: AC
  Administered 2020-01-21: 1000 mg via ORAL
  Filled 2020-01-21: qty 2

## 2020-01-21 MED ORDER — FENTANYL CITRATE (PF) 100 MCG/2ML IJ SOLN
INTRAMUSCULAR | Status: AC
  Filled 2020-01-21: qty 2

## 2020-01-21 MED ORDER — TRAMADOL HCL 50 MG PO TABS: 50.0000 mg | ORAL_TABLET | Freq: Four times a day (QID) | ORAL | Status: DC | PRN

## 2020-01-21 MED ORDER — ONDANSETRON HCL 4 MG/2ML IJ SOLN
INTRAMUSCULAR | Status: AC
  Filled 2020-01-21: qty 2

## 2020-01-21 MED ORDER — BUPIVACAINE HCL (PF) 0.5 % IJ SOLN
INTRAMUSCULAR | Status: DC | PRN
  Administered 2020-01-21: 9 mL

## 2020-01-21 MED ORDER — VANCOMYCIN HCL 1000 MG IV SOLR
INTRAVENOUS | Status: AC
  Filled 2020-01-21: qty 1000

## 2020-01-21 MED ORDER — CHLORHEXIDINE GLUCONATE 0.12 % MT SOLN
OROMUCOSAL | Status: AC
  Administered 2020-01-21: 15 mL
  Filled 2020-01-21: qty 15

## 2020-01-21 MED ORDER — LIDOCAINE 2% (20 MG/ML) 5 ML SYRINGE
INTRAMUSCULAR | Status: DC | PRN
  Administered 2020-01-21: 60 mg via INTRAVENOUS

## 2020-01-21 MED ORDER — CEFAZOLIN SODIUM-DEXTROSE 2-4 GM/100ML-% IV SOLN
2.0000 g | INTRAVENOUS | Status: AC
  Administered 2020-01-21: 2 g via INTRAVENOUS
  Filled 2020-01-21: qty 100

## 2020-01-21 MED ORDER — LIDOCAINE-EPINEPHRINE 1 %-1:100000 IJ SOLN
INTRAMUSCULAR | Status: AC
  Filled 2020-01-21: qty 1

## 2020-01-21 MED ORDER — FENTANYL CITRATE (PF) 250 MCG/5ML IJ SOLN
INTRAMUSCULAR | Status: AC
  Filled 2020-01-21: qty 5

## 2020-01-21 MED ORDER — BUPIVACAINE HCL (PF) 0.5 % IJ SOLN
INTRAMUSCULAR | Status: AC
  Filled 2020-01-21: qty 30

## 2020-01-21 MED ORDER — LIDOCAINE 2% (20 MG/ML) 5 ML SYRINGE
INTRAMUSCULAR | Status: AC
  Filled 2020-01-21: qty 5

## 2020-01-21 MED ORDER — PROPOFOL 10 MG/ML IV BOLUS
INTRAVENOUS | Status: AC
  Filled 2020-01-21: qty 20

## 2020-01-21 MED ORDER — OXYCODONE HCL 5 MG PO TABS
5.0000 mg | ORAL_TABLET | Freq: Once | ORAL | Status: AC
  Administered 2020-01-21: 5 mg via ORAL

## 2020-01-21 MED ORDER — TRAMADOL HCL 50 MG PO TABS: 50.0000 mg | ORAL_TABLET | Freq: Four times a day (QID) | ORAL | 0 refills | Status: DC | PRN

## 2020-01-21 MED ORDER — ONDANSETRON HCL 4 MG/2ML IJ SOLN
INTRAMUSCULAR | Status: DC | PRN
  Administered 2020-01-21: 4 mg via INTRAVENOUS

## 2020-01-21 MED ORDER — VANCOMYCIN HCL 1000 MG IV SOLR
INTRAVENOUS | Status: DC | PRN
  Administered 2020-01-21: 1000 mg

## 2020-01-21 MED ORDER — LIDOCAINE-EPINEPHRINE 1 %-1:100000 IJ SOLN
INTRAMUSCULAR | Status: DC | PRN
  Administered 2020-01-21: 9 mL

## 2020-01-21 MED ORDER — LACTATED RINGERS IV SOLN: INTRAVENOUS | Status: DC

## 2020-01-21 MED ORDER — OXYCODONE HCL 5 MG PO TABS
ORAL_TABLET | ORAL | Status: AC
  Filled 2020-01-21: qty 1

## 2020-01-21 MED ORDER — PROPOFOL 500 MG/50ML IV EMUL
INTRAVENOUS | Status: DC | PRN
  Administered 2020-01-21: 125 ug/kg/min via INTRAVENOUS

## 2020-01-21 SURGICAL SUPPLY — 36 items
CANISTER SUCT 3000ML PPV (MISCELLANEOUS) ×2 IMPLANT
COVER WAND RF STERILE (DRAPES) ×2 IMPLANT
DECANTER SPIKE VIAL GLASS SM (MISCELLANEOUS) ×2 IMPLANT
DERMABOND ADVANCED (GAUZE/BANDAGES/DRESSINGS) ×1
DERMABOND ADVANCED .7 DNX12 (GAUZE/BANDAGES/DRESSINGS) ×1 IMPLANT
DIFFUSER DRILL AIR PNEUMATIC (MISCELLANEOUS) ×2 IMPLANT
DRAPE LAPAROTOMY 100X72 PEDS (DRAPES) ×2 IMPLANT
DRSG OPSITE POSTOP 3X4 (GAUZE/BANDAGES/DRESSINGS) ×2 IMPLANT
DURAPREP 26ML APPLICATOR (WOUND CARE) ×2 IMPLANT
GAUZE 4X4 16PLY RFD (DISPOSABLE) IMPLANT
GLOVE BIO SURGEON STRL SZ8 (GLOVE) ×2 IMPLANT
GLOVE BIOGEL PI IND STRL 8 (GLOVE) ×1 IMPLANT
GLOVE BIOGEL PI IND STRL 8.5 (GLOVE) ×1 IMPLANT
GLOVE BIOGEL PI INDICATOR 8 (GLOVE) ×1
GLOVE BIOGEL PI INDICATOR 8.5 (GLOVE) ×1
GLOVE ECLIPSE 8.0 STRL XLNG CF (GLOVE) ×2 IMPLANT
GLOVE EXAM NITRILE XL STR (GLOVE) IMPLANT
GOWN STRL REUS W/ TWL LRG LVL3 (GOWN DISPOSABLE) IMPLANT
GOWN STRL REUS W/ TWL XL LVL3 (GOWN DISPOSABLE) ×1 IMPLANT
GOWN STRL REUS W/TWL 2XL LVL3 (GOWN DISPOSABLE) ×2 IMPLANT
GOWN STRL REUS W/TWL LRG LVL3 (GOWN DISPOSABLE)
GOWN STRL REUS W/TWL XL LVL3 (GOWN DISPOSABLE) ×1
KIT BASIN OR (CUSTOM PROCEDURE TRAY) ×2 IMPLANT
KIT TURNOVER KIT B (KITS) ×2 IMPLANT
NEEDLE HYPO 25X1 1.5 SAFETY (NEEDLE) ×2 IMPLANT
NEUROSTIM OCTOPOLAR ~~LOC~~ 60X55 (Neuro Prosthesis/Implant) ×2 IMPLANT
NS IRRIG 1000ML POUR BTL (IV SOLUTION) ×2 IMPLANT
PACK LAMINECTOMY NEURO (CUSTOM PROCEDURE TRAY) ×2 IMPLANT
PAD ARMBOARD 7.5X6 YLW CONV (MISCELLANEOUS) ×6 IMPLANT
PROGRAMMER ACTIVA DBS HANDSET (NEUROSURGERY SUPPLIES) ×2 IMPLANT
SUT SILK 2 0 PERMA HAND 18 BK (SUTURE) ×2 IMPLANT
SUT VIC AB 2-0 CP2 18 (SUTURE) ×2 IMPLANT
SUT VIC AB 3-0 SH 8-18 (SUTURE) ×2 IMPLANT
TOWEL GREEN STERILE (TOWEL DISPOSABLE) ×2 IMPLANT
TOWEL GREEN STERILE FF (TOWEL DISPOSABLE) ×2 IMPLANT
WATER STERILE IRR 1000ML POUR (IV SOLUTION) ×2 IMPLANT

## 2020-01-21 NOTE — Progress Notes (Signed)
Okay to discharge home per Dr. Venetia Maxon.

## 2020-01-21 NOTE — Anesthesia Postprocedure Evaluation (Signed)
Anesthesia Post Note  Patient: Allen [name]  Procedure(s) Performed: Deep brain stimulator battery replacement (N/A Chest)     Patient location during evaluation: PACU Anesthesia Type: MAC Level of consciousness: awake and alert, patient cooperative and oriented Pain management: pain level controlled Vital Signs Assessment: post-procedure vital signs reviewed and stable Respiratory status: spontaneous breathing, nonlabored ventilation and respiratory function stable Cardiovascular status: blood pressure returned to baseline and stable Postop Assessment: no apparent nausea or vomiting, adequate PO intake and able to ambulate Anesthetic complications: no   No complications documented.  Last Vitals:  Vitals:   01/21/20 1945 01/21/20 2000  BP: 107/65 110/63  Pulse: (!) 59 (!) 57  Resp: 15 12  Temp:  36.6 C  SpO2: 95% 97%    Last Pain:  Vitals:   01/21/20 1945  TempSrc:   PainSc: 4                  Ryli Standlee,E. Arvis Miguez

## 2020-01-21 NOTE — Interval H&P Note (Signed)
History and Physical Interval Note:  01/21/2020 5:50 PM  Allen [name]  has presented today for surgery, with the diagnosis of Tremors.  The various methods of treatment have been discussed with the patient and family. After consideration of risks, benefits and other options for treatment, the patient has consented to  Procedure(s) with comments: Deep brain stimulator battery replacement (N/A) - 3C/Rm21 as a surgical intervention.  The patient's history has been reviewed, patient examined, no change in status, stable for surgery.  I have reviewed the patient's chart and labs.  Questions were answered to the patient's satisfaction.     Dorian Heckle

## 2020-01-21 NOTE — Anesthesia Preprocedure Evaluation (Addendum)
Anesthesia Evaluation  Patient identified by MRN, date of birth, ID band Patient awake    Reviewed: Allergy & Precautions, NPO status , Patient's Chart, lab work & pertinent test results  Airway Mallampati: II  TM Distance: >3 FB Neck ROM: Full    Dental  (+) Edentulous Upper, Upper Dentures, Dental Advisory Given   Pulmonary shortness of breath, with exertion and Long-Term Oxygen Therapy, asthma , sleep apnea (2L O2 at night) and Oxygen sleep apnea , COPD,  COPD inhaler, former smoker,    Pulmonary exam normal breath sounds clear to auscultation       Cardiovascular +CHF  Normal cardiovascular exam Rhythm:Regular Rate:Normal  TTE 2021  1. Left ventricular ejection fraction, by estimation, is 60 to 65%. The left ventricle has normal function. The left ventricle has no regional wall motion abnormalities. There is mild concentric left ventricular hypertrophy. Left ventricular diastolic parameters are consistent with Grade I diastolic dysfunction (impaired relaxation).  2. Right ventricular systolic function is normal. The right ventricular size is normal. Tricuspid regurgitation signal is inadequate for assessing PA pressure.  3. Left atrial size was moderately dilated.  4. The mitral valve is normal in structure. No evidence of mitral valve regurgitation. No evidence of mitral stenosis.  5. The aortic valve is normal in structure. Aortic valve regurgitation is not visualized. No aortic stenosis is present.  6. The inferior vena cava is normal in size with greater than 50% respiratory variability, suggesting right atrial pressure of 3 mmHg   Neuro/Psych PSYCHIATRIC DISORDERS Anxiety  Neuromuscular disease (essential tremors)    GI/Hepatic Neg liver ROS, PUD, GERD  Medicated and Controlled,  Endo/Other  negative endocrine ROS  Renal/GU negative Renal ROS  negative genitourinary   Musculoskeletal  (+) Arthritis ,    Abdominal   Peds  Hematology negative hematology ROS (+)   Anesthesia Other Findings Deep brain stimulator battery replacement  Reproductive/Obstetrics                           Anesthesia Physical Anesthesia Plan  ASA: III  Anesthesia Plan: MAC   Post-op Pain Management:    Induction: Intravenous  PONV Risk Score and Plan: 2 and Midazolam, Dexamethasone and Ondansetron  Airway Management Planned: Natural Airway and Simple Face Mask  Additional Equipment:   Intra-op Plan:   Post-operative Plan:   Informed Consent: I have reviewed the patients History and Physical, chart, labs and discussed the procedure including the risks, benefits and alternatives for the proposed anesthesia with the patient or authorized representative who has indicated his/her understanding and acceptance.     Dental advisory given  Plan Discussed with: CRNA  Anesthesia Plan Comments:        Anesthesia Quick Evaluation

## 2020-01-21 NOTE — H&P (Signed)
Patient ID:   [id number] Patient: Allen [name]  Date of Birth: 09-05-1950 Visit Type: Office Visit   Date: 01/20/2020 12:45 PM Provider: Danae Orleans. Venetia Maxon MD   This 70 year old male presents for DBS Battery Replacement.  HISTORY OF PRESENT ILLNESS: 1.  DBS Battery Replacement  Allen [name] is a 70 year old male who was referred to the clinic by Dr. Arbutus Leas for replacement of his Medtronic deep brain stimulator battery.  The patient's deep brain stimulator was checked on 01/04/20 and the battery was determined to be functioning normally, but was noted to have about 4 weeks of life left.  The patient's DBS is a unilateral left-sided device that was originally implanted in [place] in 12/2014.  At the time of placement of the DBS, the patient had symptoms of tremors in his bilateral upper extremities.  He chose to only address the right upper extremity tremors due to him being right hand dominant.  He reports that his deep brain stimulator is controlling his tremors moderately.  He continues to have a significant amount of tremors in his left upper extremity.  Past medical history:  COPD  Past surgical history:  Left reverse shoulder replacement, right rotator cuff repair, right ankle tendon repair, appendectomy       PAST MEDICAL/SURGICAL HISTORY:   (Detailed)   Disease/disorder Onset Date Management Date Comments Anxiety     Arthritis     COPD     Gerd       Appendectomy, 2015     Hernia repair, 2007     Vein surgery, 2009     Rotator cuff, 2014     Shoulder replacement, 2016     DBS, 2016     Left carpal tunnel release, 2019     Bilateral cataract extraction, 2020     Hand surgery, 2020     Foot surgery (2 torn tendons), 2021      Family History:  (Detailed)   Social History:  (Detailed) Tobacco use reviewed. Preferred language is Unknown.   Smoking status: Former smoker.  SMOKING STATUS Type Smoking  Status Usage Per Day Years Used Total Pack Years  Former smoker         MEDICATIONS: (added, continued or stopped this visit) Started Medication Directions Instruction Stopped  albuterol sulfate     ARNUITY ELLIPTA     azelastine spray 1 spray by intranasal route  every day in each nostril    Cialis 10 mg tablet take 1 tablet by oral route  every day    CoQ-10     escitalopram 10 mg tablet take 1 tablet by oral route  every day    fish oil     folic acid 400 mcg tablet     montelukast 10 mg tablet take 1 tablet by oral route  every day    omeprazole 20 mg tablet,delayed release     potassium citrate     quetiapine 25 mg tablet take 1 tablet by oral route  every day    Tart Cherry     trazodone 100 mg tablet take 1 tablet by oral route  every day    Trelegy Ellipta 100 mcg-62.5 mcg-25 mcg powder for inhalation     turmeric     Vitamin C     Vitamin D3 25 mcg (1,000 unit) capsule       ALLERGIES: Ingredient Reaction Medication Name Comment NO KNOWN ALLERGIES    No known allergies. Reviewed, updated.    PHYSICAL EXAM:  Vitals Date Temp F BP Pulse Ht In Wt Lb BMI BSA Pain Score 01/20/2020  109/68 79 70 243 34.87  0/10   PHYSICAL EXAM Details General Level of Distress: no acute distress Overall Appearance: normal  Head and Face  Right Left  Fundoscopic Exam:  normal normal    Cardiovascular Cardiac: regular rate and rhythm without murmur  Right Left  Carotid Pulses: normal normal  Respiratory Lungs: clear to auscultation  Neurological Orientation: normal Recent and Remote Memory: normal Attention Span and Concentration:   normal Language: normal Fund of Knowledge: normal  Right Left Sensation: normal normal Upper Extremity Coordination: normal normal  Lower Extremity Coordination: normal normal  Musculoskeletal Gait and  Station: normal  Right Left Upper Extremity Muscle Strength: normal normal Lower Extremity Muscle Strength: normal normal Upper Extremity Muscle Tone:  normal normal Lower Extremity Muscle Tone: normal normal   Motor Strength Upper and lower extremity motor strength was tested in the clinically pertinent muscles.     Deep Tendon Reflexes  Right Left Biceps: normal normal Triceps: normal normal Brachioradialis: normal normal Patellar: normal normal Achilles: normal normal  Cranial Nerves II. Optic Nerve/Visual Fields: normal III. Oculomotor: normal IV. Trochlear: normal V. Trigeminal: normal VI. Abducens: normal VII. Facial: normal VIII. Acoustic/Vestibular: normal IX. Glossopharyngeal: normal X. Vagus: normal XI. Spinal Accessory: normal XII. Hypoglossal: normal  Motor and other Tests Lhermittes: negative Rhomberg: negative Pronator drift: absent     Right Left Hoffman's: normal normal Clonus: normal normal Babinski: normal normal      IMPRESSION:  Patient presents for replacement of his left-sided unilateral Medtronic deep brain stimulator battery.  On exam the patient had moderate tremors in his left hand.  Otherwise his neurological exam was benign.  PLAN: Proceed with replacement of Medtronic deep being stimulator battery.  Risks and benefits of the surgery were discussed with the patient and his wife and they wished to proceed with surgery.  Detail patient Education was performed while in the office today.  The patient will undergo replacement of his battery on 01/21/2020 at [facility].  He will follow-up in the clinic on 02/09/2020.   Assessment/Plan  # Detail Type Description  1. Assessment Tremors of nervous system (R25.1).     2. Assessment S/P deep brain stimulator placement (Z96.89).                   Provider:  Marchia Meiers. Vertell Limber MD  01/20/2020 02:40 PM    Dictation edited by: Fenton Malling, NP    CC  Providers: Alonza Bogus  [address] [place], [place] [address]               Electronically signed by Fenton Malling NP on 01/20/2020 02:40 PM  on behalf of Marchia Meiers. Vertell Limber MD

## 2020-01-21 NOTE — Anesthesia Procedure Notes (Signed)
Procedure Name: MAC Date/Time: 01/21/2020 6:35 PM Performed by: Alain Marion, CRNA Pre-anesthesia Checklist: Patient identified, Emergency Drugs available, Suction available and Patient being monitored Oxygen Delivery Method: Simple face mask Placement Confirmation: positive ETCO2

## 2020-01-21 NOTE — Op Note (Signed)
01/21/2020  7:13 PM  PATIENT:  Allen [name]  70 y.o. male  PRE-OPERATIVE DIAGNOSIS:  Tremors with depleted DBS battery  POST-OPERATIVE DIAGNOSIS:   Tremors with depleted DBS battery  PROCEDURE:  Procedure(s) with comments: Deep brain stimulator battery replacement (N/A) - 3C/Rm21  SURGEON:  Surgeon(s) and Role:    Erline Levine, MD - Primary  PHYSICIAN ASSISTANT: Glenford Peers, NP  ASSISTANTS: none   ANESTHESIA:   MAC  EBL:  0 mL   BLOOD ADMINISTERED:none  DRAINS: none   LOCAL MEDICATIONS USED:  MARCAINE    and LIDOCAINE   SPECIMEN:  No Specimen  DISPOSITION OF SPECIMEN:  N/A  COUNTS:  YES  TOURNIQUET:  * No tourniquets in log *  DICTATION: Patient has implanted left VIM stimulator electrodes and IPG, which is now depleted.  It was elected for patient to undergo IPG revision.  PROCEDURE: Patient was brought to the operating room and given intravenous sedation.  Left upper chest was prepped with betadine scrub and Duraprep.  Area of planned incision was infiltrated with lidocaine.  Prior incision was reopened and the old IPG was externalized.  Adaptor was connected to new IPG which was placed in the pocket.  Wound was irrigated with saline and with vancomycin. Then irrigated once more.  Incision was closed with 2-0 Vicryl and 3-0 vicryl sutures and dressed with a sterile occlusive dressing.  Counts were correct at the end of the case.  PLAN OF CARE: Discharge to home after PACU  PATIENT DISPOSITION:  PACU - hemodynamically stable.   Delay start of Pharmacological VTE agent (>24hrs) due to surgical blood loss or risk of bleeding: yes

## 2020-01-21 NOTE — Transfer of Care (Signed)
Immediate Anesthesia Transfer of Care Note  Patient: Allen [name]  Procedure(s) Performed: Deep brain stimulator battery replacement (N/A Chest)  Patient Location: PACU  Anesthesia Type:MAC  Level of Consciousness: awake, alert  and oriented  Airway & Oxygen Therapy: Patient Spontanous Breathing and Patient connected to face mask oxygen  Post-op Assessment: Report given to RN and Post -op Vital signs reviewed and stable  Post vital signs: Reviewed and stable  Last Vitals:  Vitals Value Taken Time  BP 104/58 01/21/20 1935  Temp    Pulse 64 01/21/20 1938  Resp 12 01/21/20 1938  SpO2 92 % 01/21/20 1938  Vitals shown include unvalidated device data.  Last Pain:  Vitals:   01/21/20 1930  TempSrc:   PainSc: 6       Patients Stated Pain Goal: 4 (66/44/03 4742)  Complications: No complications documented.

## 2020-01-21 NOTE — Discharge Summary (Signed)
Physician Discharge Summary  Patient ID: Allen [name] MRN: [id number] DOB/AGE: Jul 21, 1950 70 y.o.  Admit date: 01/21/2020 Discharge date: 01/21/2020  Admission Diagnoses:Tremor with depleted implantable pulse generator  Discharge Diagnoses: Same Active Problems:   * No active hospital problems. *   Discharged Condition: good  Hospital Course: Patient underwent uncomplicated battery exchange for his left deep brain stimulator pulse generator, which was depleted  Consults: None  Significant Diagnostic Studies: None  Treatments: surgery: Patient underwent uncomplicated battery exchange for his left deep brain stimulator pulse generator, which was depleted  Discharge Exam: Blood pressure 114/71, pulse 77, temperature 98.6 F (37 C), temperature source Oral, resp. rate 16, height 5\' 10"  (1.778 m), weight 107 kg, SpO2 96 %. Neurologic: Alert and oriented X 3, normal strength and tone. Normal symmetric reflexes. Normal coordination and gait Wound:CDI  Disposition: Home   Allergies as of 01/21/2020   No Known Allergies     Medication List    TAKE these medications   albuterol (2.5 MG/3ML) 0.083% nebulizer solution Commonly known as: PROVENTIL Take 3 mLs (2.5 mg total) by nebulization every 6 (six) hours.   albuterol 108 (90 Base) MCG/ACT inhaler Commonly known as: VENTOLIN HFA Inhale 2 puffs into the lungs every 4 (four) hours as needed for wheezing or shortness of breath.   azelastine 0.1 % nasal spray Commonly known as: ASTELIN Place 1-2 sprays into both nostrils 2 (two) times daily as needed for rhinitis.   Azelastine-Fluticasone 137-50 MCG/ACT Susp Place 1 spray into both nostrils 2 (two) times daily as needed.   benzonatate 100 MG capsule Commonly known as: TESSALON TAKE 1 CAPSULE (100 MG TOTAL) BY MOUTH 2 (TWO) TIMES DAILY AS NEEDED FOR COUGH.   fluticasone 50 MCG/ACT nasal spray Commonly known as: Flonase Place 2 sprays into both nostrils daily as needed for  allergies or rhinitis.   folic acid 400 MCG tablet Commonly known as: FOLVITE Take 400 mcg by mouth daily.   montelukast 10 MG tablet Commonly known as: SINGULAIR Take 1 tablet (10 mg total) by mouth at bedtime.   omeprazole 20 MG capsule Commonly known as: PRILOSEC TAKE 1 CAPSULE BY MOUTH EVERY DAY   QUEtiapine 25 MG tablet Commonly known as: SEROQUEL Take 25 mg by mouth at bedtime.   tadalafil 20 MG tablet Commonly known as: CIALIS Take 1 tablet (20 mg total) by mouth daily as needed for erectile dysfunction. What changed:   how much to take  when to take this   traMADol 50 MG tablet Commonly known as: ULTRAM Take 1 tablet (50 mg total) by mouth every 6 (six) hours as needed for moderate pain.   traZODone 100 MG tablet Commonly known as: DESYREL Take 100 mg by mouth at bedtime.   Trelegy Ellipta 100-62.5-25 MCG/INH Aepb Generic drug: Fluticasone-Umeclidin-Vilant Inhale 1 puff into the lungs daily.   vitamin C 1000 MG tablet Take 1,000 mg by mouth daily.   Vitamin D (Ergocalciferol) 1.25 MG (50000 UNIT) Caps capsule Commonly known as: DRISDOL Take 1 capsule (50,000 Units total) by mouth every 7 (seven) days for 12 doses.        Signed: 02-13-1985, MD 01/21/2020, 7:20 PM

## 2020-01-21 NOTE — Brief Op Note (Signed)
01/21/2020  7:13 PM  PATIENT:  [name]  70 y.o. male  PRE-OPERATIVE DIAGNOSIS:  Tremors with depleted DBS battery  POST-OPERATIVE DIAGNOSIS:   Tremors with depleted DBS battery  PROCEDURE:  Procedure(s) with comments: Deep brain stimulator battery replacement (N/A) - 3C/Rm21  SURGEON:  Surgeon(s) and Role:    Erline Levine, MD - Primary  PHYSICIAN ASSISTANT: Glenford Peers, NP  ASSISTANTS: none   ANESTHESIA:   MAC  EBL:  0 mL   BLOOD ADMINISTERED:none  DRAINS: none   LOCAL MEDICATIONS USED:  MARCAINE    and LIDOCAINE   SPECIMEN:  No Specimen  DISPOSITION OF SPECIMEN:  N/A  COUNTS:  YES  TOURNIQUET:  * No tourniquets in log *  DICTATION: Patient has implanted left VIM stimulator electrodes and IPG, which is now depleted.  It was elected for patient to undergo IPG revision.  PROCEDURE: Patient was brought to the operating room and given intravenous sedation.  Left upper chest was prepped with betadine scrub and Duraprep.  Area of planned incision was infiltrated with lidocaine.  Prior incision was reopened and the old IPG was externalized.  Adaptor was connected to new IPG which was placed in the pocket.  Wound was irrigated with saline and with vancomycin. Then irrigated once more.  Incision was closed with 2-0 Vicryl and 3-0 vicryl sutures and dressed with a sterile occlusive dressing.  Counts were correct at the end of the case.  PLAN OF CARE: Discharge to home after PACU  PATIENT DISPOSITION:  PACU - hemodynamically stable.   Delay start of Pharmacological VTE agent (>24hrs) due to surgical blood loss or risk of bleeding: yes

## 2020-01-22 ENCOUNTER — Encounter (HOSPITAL_COMMUNITY): Payer: Self-pay | Admitting: Neurosurgery

## 2020-01-23 DIAGNOSIS — R69 Illness, unspecified: Secondary | ICD-10-CM | POA: Diagnosis not present

## 2020-01-26 ENCOUNTER — Telehealth: Payer: Self-pay

## 2020-01-26 NOTE — Telephone Encounter (Signed)
PA for Judithann Sauger was approved through [email or web address]

## 2020-01-26 NOTE — Telephone Encounter (Signed)
PA for Allen [name] was initiated through [email or web address] waiting approval.

## 2020-01-27 ENCOUNTER — Encounter: Payer: Self-pay | Admitting: Internal Medicine

## 2020-02-02 DIAGNOSIS — N410 Acute prostatitis: Secondary | ICD-10-CM | POA: Diagnosis not present

## 2020-02-02 DIAGNOSIS — R972 Elevated prostate specific antigen [PSA]: Secondary | ICD-10-CM | POA: Diagnosis not present

## 2020-02-05 ENCOUNTER — Ambulatory Visit: Payer: Medicare HMO | Admitting: Family Medicine

## 2020-02-05 ENCOUNTER — Other Ambulatory Visit: Payer: Self-pay

## 2020-02-05 ENCOUNTER — Encounter: Payer: Self-pay | Admitting: Family Medicine

## 2020-02-05 VITALS — BP 106/72 | HR 110 | Ht 70.0 in | Wt 235.0 lb

## 2020-02-05 DIAGNOSIS — M5030 Other cervical disc degeneration, unspecified cervical region: Secondary | ICD-10-CM | POA: Diagnosis not present

## 2020-02-05 DIAGNOSIS — M2550 Pain in unspecified joint: Secondary | ICD-10-CM | POA: Diagnosis not present

## 2020-02-05 MED ORDER — KETOROLAC TROMETHAMINE 30 MG/ML IJ SOLN
30.0000 mg | Freq: Once | INTRAMUSCULAR | Status: AC
  Administered 2020-02-05: 30 mg via INTRAMUSCULAR

## 2020-02-05 MED ORDER — METHYLPREDNISOLONE ACETATE 40 MG/ML IJ SUSP
40.0000 mg | Freq: Once | INTRAMUSCULAR | Status: AC
  Administered 2020-02-05: 40 mg via INTRAMUSCULAR

## 2020-02-05 MED ORDER — TIZANIDINE HCL 2 MG PO TABS: 2.0000 mg | ORAL_TABLET | Freq: Every day | ORAL | 0 refills | Status: DC

## 2020-02-05 NOTE — Patient Instructions (Addendum)
Injections in backside today Use Zanaflex at night-Hold trazadone

## 2020-02-05 NOTE — Progress Notes (Signed)
[place] Phone: [phone] Subjective:   Allen [name], am serving as a scribe for Dr. Hulan Saas. This visit occurred during the SARS-CoV-2 public health emergency.  Safety protocols were in place, including screening questions prior to the visit, additional usage of staff PPE, and extensive cleaning of exam room while observing appropriate contact time as indicated for disinfecting solutions.   I'm seeing this patient by the request  of:  Isaac Bliss, Rayford Halsted, MD  CC: Neck pain follow-up  [id number]  Allen [name] is a 70 y.o. male coming in with complaint of back and neck pain. OMT 01/20/2020. Patient states that his neck began bothering him 2 days ago. Patient was unable to urinate and urologist found prostate infection.  Patient is on antibiotics for it.  Woke up 1 to 2 days with the severe neck pain.  [name] able to move it at this time.  [name] radiation of pain, seems to stay very localized.  [name] weakness in the upper extremities.  Patient is concerned because his son is getting married  Medications patient has been prescribed: Ibuprofen but [name] improvement.           Reviewed prior external information including notes and imaging from previsou exam, outside providers and external EMR if available.   As well as notes that were available from care everywhere and other healthcare systems.  Past medical history, social, surgical and family history all reviewed in electronic medical record.  [name] pertanent information unless stated regarding to the chief complaint.   Past Medical History:  Diagnosis Date   Anxiety    Arthritis    neck - [name] meds   Asthma    Bilateral cataracts    removed by surgery   COPD (chronic obstructive pulmonary disease) (HCC)    Dyspnea    occasional with exertion   ED (erectile dysfunction)    Essential tremor    with nerve stimulator   GERD (gastroesophageal  reflux disease)    Insomnia    OSA treated with BiPAP    does not use bipap - uses 2 L oxygen via [place] while sleeping   Recurrent upper respiratory infection (URI)    Right carpal tunnel syndrome     [name] Known Allergies   Review of Systems:  [name] headache, visual changes, nausea, vomiting, diarrhea, constipation, dizziness, abdominal pain, skin rash, fevers, chills, night sweats, weight loss, swollen lymph nodes,  joint swelling, chest pain, shortness of breath, mood changes. POSITIVE muscle aches, body aches, [name] more fever since he has been on the antibiotic  Objective  Blood pressure 106/72, pulse (!) 110, height 5\' 10"  (1.778 m), weight 235 lb (106.6 kg), SpO2 98 %.   General: [name] apparent distress alert and oriented x3 mood and affect normal, dressed appropriately.  HEENT: Pupils equal, extraocular movements intact  Respiratory: Patient's speak in full sentences and does not appear short of breath  Cardiovascular: [name] lower extremity edema, non tender, [name] erythema  Patient's neck has severe tightness noted.  Very minimal sidebending or rotation.  [name] significant tightness of the muscle.  [name] midline tenderness.  Patient has only flexion of 70 degrees and extension of 5 degrees.  5 out of 5 strength of the upper extremities        Assessment and Plan:       The above documentation has been reviewed and is accurate and complete Lyndal Pulley, DO  Note: This dictation was prepared with Dragon dictation along with smaller phrase technology. Any transcriptional errors that result from this process are unintentional.

## 2020-02-05 NOTE — Assessment & Plan Note (Signed)
Significant arthritic change of the neck.  Patient is going to be doing physical therapy and we will see how he responds.  Having an acute reaction at this time.  Patient given Toradol and Depo-Medrol today.  Patient in severe pain and given a muscle relaxer.  Warned of potential side effects and to take it at night.  Discussed may be holding his trazodone.  Patient knows if worsening pain he needs to seek medical attention.  If continuing to have pain MRI is necessary.  Patient will continue with his regular follow-up

## 2020-02-08 DIAGNOSIS — M542 Cervicalgia: Secondary | ICD-10-CM | POA: Diagnosis not present

## 2020-02-16 DIAGNOSIS — Z20822 Contact with and (suspected) exposure to covid-19: Secondary | ICD-10-CM | POA: Diagnosis not present

## 2020-02-17 ENCOUNTER — Other Ambulatory Visit: Payer: Self-pay | Admitting: Allergy & Immunology

## 2020-02-17 DIAGNOSIS — R69 Illness, unspecified: Secondary | ICD-10-CM | POA: Diagnosis not present

## 2020-02-19 DIAGNOSIS — N401 Enlarged prostate with lower urinary tract symptoms: Secondary | ICD-10-CM | POA: Diagnosis not present

## 2020-02-19 DIAGNOSIS — N410 Acute prostatitis: Secondary | ICD-10-CM | POA: Diagnosis not present

## 2020-02-19 DIAGNOSIS — G5601 Carpal tunnel syndrome, right upper limb: Secondary | ICD-10-CM | POA: Diagnosis not present

## 2020-02-19 DIAGNOSIS — M25532 Pain in left wrist: Secondary | ICD-10-CM | POA: Diagnosis not present

## 2020-02-19 DIAGNOSIS — M1852 Other unilateral secondary osteoarthritis of first carpometacarpal joint, left hand: Secondary | ICD-10-CM | POA: Diagnosis not present

## 2020-02-20 DIAGNOSIS — S86311D Strain of muscle(s) and tendon(s) of peroneal muscle group at lower leg level, right leg, subsequent encounter: Secondary | ICD-10-CM | POA: Diagnosis not present

## 2020-02-20 DIAGNOSIS — S86311A Strain of muscle(s) and tendon(s) of peroneal muscle group at lower leg level, right leg, initial encounter: Secondary | ICD-10-CM | POA: Diagnosis not present

## 2020-02-20 DIAGNOSIS — J432 Centrilobular emphysema: Secondary | ICD-10-CM | POA: Diagnosis not present

## 2020-02-20 DIAGNOSIS — I5032 Chronic diastolic (congestive) heart failure: Secondary | ICD-10-CM | POA: Diagnosis not present

## 2020-02-20 DIAGNOSIS — G4733 Obstructive sleep apnea (adult) (pediatric): Secondary | ICD-10-CM | POA: Diagnosis not present

## 2020-02-22 ENCOUNTER — Other Ambulatory Visit (HOSPITAL_COMMUNITY): Payer: Self-pay | Admitting: Neurosurgery

## 2020-02-22 ENCOUNTER — Other Ambulatory Visit: Payer: Self-pay | Admitting: Neurosurgery

## 2020-02-22 DIAGNOSIS — R030 Elevated blood-pressure reading, without diagnosis of hypertension: Secondary | ICD-10-CM | POA: Insufficient documentation

## 2020-02-22 DIAGNOSIS — M5412 Radiculopathy, cervical region: Secondary | ICD-10-CM | POA: Insufficient documentation

## 2020-02-22 DIAGNOSIS — M542 Cervicalgia: Secondary | ICD-10-CM | POA: Insufficient documentation

## 2020-02-22 DIAGNOSIS — Z6832 Body mass index (BMI) 32.0-32.9, adult: Secondary | ICD-10-CM | POA: Insufficient documentation

## 2020-02-24 DIAGNOSIS — M542 Cervicalgia: Secondary | ICD-10-CM | POA: Diagnosis not present

## 2020-02-27 ENCOUNTER — Other Ambulatory Visit: Payer: Self-pay | Admitting: Family Medicine

## 2020-02-29 ENCOUNTER — Encounter: Payer: Self-pay | Admitting: Internal Medicine

## 2020-03-01 NOTE — Progress Notes (Addendum)
[place] [address] [place] Phone: [phone] Subjective:   I Allen [name] am serving as a Education administrator for Dr. Hulan Saas.  This visit occurred during the SARS-CoV-2 public health emergency.  Safety protocols were in place, including screening questions prior to the visit, additional usage of staff PPE, and extensive cleaning of exam room while observing appropriate contact time as indicated for disinfecting solutions.   I'm seeing this patient by the request  of:  Isaac Bliss, Rayford Halsted, MD  CC: Neck pain follow-up  [id number]   02/05/2020 Significant arthritic change of the neck.  Patient is going to be doing physical therapy and we will see how he responds.  Having an acute reaction at this time.  Patient given Toradol and Depo-Medrol today.  Patient in severe pain and given a muscle relaxer.  Warned of potential side effects and to take it at night.  Discussed may be holding his trazodone.  Patient knows if worsening pain he needs to seek medical attention.  If continuing to have pain MRI is necessary.  Patient will continue with his regular follow-up  Update 03/02/2020 Allen [name] is a 70 y.o. male coming in with complaint of cervical spine pain. Patient states he would like to get MRI and that insurance approved it. Neck pain is tolerable. States he had dry needling done. States he knows the pain is still there.  Patient states that it is always there.  Affecting daily activities, some radiation down the arms.  Denies of any weakness. Patient is also scheduled to have surgery on his left [facility] joint in April.    Past Medical History:  Diagnosis Date  . Anxiety   . Arthritis    neck - no meds  . Asthma   . Bilateral cataracts    removed by surgery  . COPD (chronic obstructive pulmonary disease) ([place])   . Dyspnea    occasional with exertion  . ED (erectile dysfunction)   . Essential tremor    with nerve stimulator  .  GERD (gastroesophageal reflux disease)   . Insomnia   . OSA treated with BiPAP    does not use bipap - uses 2 L oxygen via [place] while sleeping  . Recurrent upper respiratory infection (URI)   . Right carpal tunnel syndrome    Past Surgical History:  Procedure Laterality Date  . ADENOIDECTOMY    . ANKLE SURGERY Right   . APPENDECTOMY  2015  . CARPAL TUNNEL RELEASE Left 2019  . CATARACT EXTRACTION Bilateral 2020  . COLONOSCOPY    . DEEP BRAIN STIMULATOR PLACEMENT Left 2016  . HERNIA REPAIR Right 2007  . ROTATOR CUFF REPAIR Right 2014  . SUBTHALAMIC STIMULATOR BATTERY REPLACEMENT N/A 01/21/2020   Procedure: Deep brain stimulator battery replacement;  Surgeon: Erline Levine, MD;  Location: [facility];  Service: Neurosurgery;  Laterality: N/A;  . TONSILLECTOMY    . TOTAL SHOULDER REPLACEMENT Left 2016  . VEIN SURGERY Bilateral 2009   varicose veins- bilateral   Social History   Socioeconomic History  . Marital status: Married    Spouse name: Not on file  . Number of children: 3  . Years of education: Not on file  . Highest education level: Associate degree: occupational, Hotel manager, or vocational program  Occupational History  . Not on file  Tobacco Use  . Smoking status: Former Smoker    Packs/day: 3.00    Years: 20.00    Pack years: 60.00  Types: Cigarettes    Quit date: 01/15/1989    Years since quitting: 31.1  . Smokeless tobacco: Former Network engineer  . Vaping Use: Never used  Substance and Sexual Activity  . Alcohol use: Not Currently    Alcohol/week: 14.0 standard drinks    Types: 14 Glasses of wine per week    Comment: 1 glass wine occassionally  . Drug use: Never  . Sexual activity: Yes  Other Topics Concern  . Not on file  Social History Narrative  . Not on file   Social Determinants of Health   Financial Resource Strain: Not on file  Food Insecurity: Not on file  Transportation Needs: Not on file  Physical Activity: Not on file  Stress: Not on file   Social Connections: Not on file   No Known Allergies Family History  Problem Relation Age of Onset  . COPD Mother   . Asthma Father   . Rectal cancer Sister   . Healthy Child   . Urticaria Neg Hx   . Immunodeficiency Neg Hx   . Eczema Neg Hx   . Atopy Neg Hx   . Angioedema Neg Hx   . Allergic rhinitis Neg Hx      Current Outpatient Medications (Cardiovascular):  .  tadalafil (CIALIS) 20 MG tablet, Take 1 tablet (20 mg total) by mouth daily as needed for erectile dysfunction. (Patient taking differently: Take 10 mg by mouth daily.)  Current Outpatient Medications (Respiratory):  .  albuterol (PROVENTIL) (2.5 MG/3ML) 0.083% nebulizer solution, Take 3 mLs (2.5 mg total) by nebulization every 6 (six) hours. [name] Kitchen  albuterol (VENTOLIN HFA) 108 (90 Base) MCG/ACT inhaler, Inhale 2 puffs into the lungs every 4 (four) hours as needed for wheezing or shortness of breath. [name] Kitchen  azelastine (ASTELIN) 0.1 % nasal spray, Place 1-2 sprays into both nostrils 2 (two) times daily as needed for rhinitis. .  Azelastine-Fluticasone 137-50 MCG/ACT SUSP, Place 1 spray into both nostrils 2 (two) times daily as needed. .  benzonatate (TESSALON) 100 MG capsule, TAKE 1 CAPSULE (100 MG TOTAL) BY MOUTH 2 (TWO) TIMES DAILY AS NEEDED FOR COUGH. .  fluticasone (FLONASE) 50 MCG/ACT nasal spray, Place 2 sprays into both nostrils daily as needed for allergies or rhinitis. .  Fluticasone-Umeclidin-Vilant (TRELEGY ELLIPTA) 100-62.5-25 MCG/INH AEPB, Inhale 1 puff into the lungs daily. .  montelukast (SINGULAIR) 10 MG tablet, Take 1 tablet (10 mg total) by mouth at bedtime.  Current Outpatient Medications (Analgesics):  .  traMADol (ULTRAM) 50 MG tablet, Take 1 tablet (50 mg total) by mouth every 6 (six) hours as needed for moderate pain.  Current Outpatient Medications (Hematological):  .  folic acid (FOLVITE) 235 MCG tablet, Take 400 mcg by mouth daily.  Current Outpatient Medications (Other):  [name] Kitchen  Ascorbic Acid (VITAMIN  C) 1000 MG tablet, Take 1,000 mg by mouth daily. [name] Kitchen  omeprazole (PRILOSEC) 20 MG capsule, TAKE 1 CAPSULE BY MOUTH EVERY DAY .  QUEtiapine (SEROQUEL) 25 MG tablet, Take 25 mg by mouth at bedtime. [name] Kitchen  tiZANidine (ZANAFLEX) 2 MG tablet, TAKE 1 TABLET BY MOUTH AT BEDTIME. .  traZODone (DESYREL) 100 MG tablet, Take 100 mg by mouth at bedtime. .  Vitamin D, Ergocalciferol, (DRISDOL) 1.25 MG (50000 UNIT) CAPS capsule, Take 1 capsule (50,000 Units total) by mouth every 7 (seven) days for 12 doses.   Reviewed prior external information including notes and imaging from  primary care provider As well as notes that were available from care everywhere and  other healthcare systems.  Past medical history, social, surgical and family history all reviewed in electronic medical record.  No pertanent information unless stated regarding to the chief complaint.   Review of Systems:  No headache, visual changes, nausea, vomiting, diarrhea, constipation, dizziness, abdominal pain, skin rash, fevers, chills, night sweats, weight loss, swollen lymph nodes, body aches,ches, body aches  Objective  Blood pressure 126/70, pulse 81, height 5\' 10"  (1.778 m), weight 228 lb (103.4 kg), SpO2 98 %.   General: No apparent distress alert and oriented x3 mood and affect normal, dressed appropriately.  HEENT: Pupils equal, extraocular movements intact  Respiratory: Patient's speak in full sentences and does not appear short of breath  Cardiovascular: No lower extremity edema, non tender, no erythema  Gait normal with good balance and coordination.  MSK: Neck exam does have significant loss of lordosis.  Only 5 degrees of extension.  Severe amount of pain though.  Patient does have decent grip strength but does have significant arthritic changes of the hands bilaterally.  On exam patient is minorly tender more in the popliteal on the left side.  Seems to be just over the bone and 1 significant location without T5 T7 rib.  No  crepitus noted.  Patient is able to take a deep breath without any concern.  No true masses appreciated.  Patient states it has only been hurting him for maybe 1 week and does think it is getting better.    Impression and Recommendations:     The above documentation has been reviewed and is accurate and complete Lyndal Pulley, DO

## 2020-03-02 ENCOUNTER — Other Ambulatory Visit: Payer: Self-pay

## 2020-03-02 ENCOUNTER — Ambulatory Visit: Payer: Medicare HMO | Admitting: Family Medicine

## 2020-03-02 ENCOUNTER — Encounter: Payer: Self-pay | Admitting: Family Medicine

## 2020-03-02 DIAGNOSIS — S4992XA Unspecified injury of left shoulder and upper arm, initial encounter: Secondary | ICD-10-CM | POA: Diagnosis not present

## 2020-03-02 DIAGNOSIS — M5030 Other cervical disc degeneration, unspecified cervical region: Secondary | ICD-10-CM

## 2020-03-02 NOTE — Assessment & Plan Note (Addendum)
Patient did have some mild improvement with physical therapy but we will hold at this time.  Continuing to have discomfort and pain and I do feel the MRI is necessary.  Patient does have a battery pack for a brain stimulator and needs to have this done in the hospital.  Patient has seen neurosurgery and has had the approval letter from his insurance.  We gave him the information and the phone number to schedule an depending on imaging to follow-up with neurosurgery but I am here if he has any questions.  Total time with patient today with reviewing his chart as well as discussing alternative treatments 36 minutes

## 2020-03-02 NOTE — Patient Instructions (Signed)
Good to see you MRI at the hospital [phone] Keep calling Dr. Vertell Limber you are in good hands Watch the arm pain and if it doesn't get better see primary care within the month I am here when you need me We will hold on anything else until MRI and to see what Dr. Vertell Limber will do

## 2020-03-03 ENCOUNTER — Ambulatory Visit: Payer: Medicare HMO | Admitting: Internal Medicine

## 2020-03-03 DIAGNOSIS — S4990XA Unspecified injury of shoulder and upper arm, unspecified arm, initial encounter: Secondary | ICD-10-CM | POA: Insufficient documentation

## 2020-03-03 NOTE — Assessment & Plan Note (Signed)
Patient is bringing up some mild pain on exam in the axilla and does state its been there for 2 weeks.  Did not feel any true masses appreciated.  Patient though has not been feeling very good.  Does have an essential tremor.  Discussed this could be radicular symptoms or with patient's history of congestive heart failure we discussed laboratory work-up and potential chest x-ray.  Patient is scheduled to see primary care provider this week and will discuss with her.

## 2020-03-08 ENCOUNTER — Ambulatory Visit: Payer: Medicare HMO | Admitting: Internal Medicine

## 2020-03-08 ENCOUNTER — Other Ambulatory Visit: Payer: Self-pay

## 2020-03-08 ENCOUNTER — Ambulatory Visit (INDEPENDENT_AMBULATORY_CARE_PROVIDER_SITE_OTHER): Payer: Medicare HMO | Admitting: Family Medicine

## 2020-03-08 ENCOUNTER — Encounter: Payer: Self-pay | Admitting: Family Medicine

## 2020-03-08 VITALS — BP 102/70 | HR 82 | Temp 98.3°F | Ht 70.0 in | Wt 227.7 lb

## 2020-03-08 DIAGNOSIS — R5383 Other fatigue: Secondary | ICD-10-CM

## 2020-03-08 DIAGNOSIS — R42 Dizziness and giddiness: Secondary | ICD-10-CM | POA: Diagnosis not present

## 2020-03-08 DIAGNOSIS — R972 Elevated prostate specific antigen [PSA]: Secondary | ICD-10-CM | POA: Diagnosis not present

## 2020-03-08 LAB — CBC WITH DIFFERENTIAL/PLATELET
Basophils Absolute: 0.1 10*3/uL (ref 0.0–0.1)
Basophils Relative: 0.9 % (ref 0.0–3.0)
Eosinophils Absolute: 0.1 10*3/uL (ref 0.0–0.7)
Eosinophils Relative: 1.2 % (ref 0.0–5.0)
HCT: 35.5 % — ABNORMAL LOW (ref 39.0–52.0)
Hemoglobin: 11.9 g/dL — ABNORMAL LOW (ref 13.0–17.0)
Lymphocytes Relative: 20 % (ref 12.0–46.0)
Lymphs Abs: 1.3 10*3/uL (ref 0.7–4.0)
MCHC: 33.6 g/dL (ref 30.0–36.0)
MCV: 93 fl (ref 78.0–100.0)
Monocytes Absolute: 0.5 10*3/uL (ref 0.1–1.0)
Monocytes Relative: 7.7 % (ref 3.0–12.0)
Neutro Abs: 4.6 10*3/uL (ref 1.4–7.7)
Neutrophils Relative %: 70.2 % (ref 43.0–77.0)
Platelets: 296 10*3/uL (ref 150.0–400.0)
RBC: 3.81 Mil/uL — ABNORMAL LOW (ref 4.22–5.81)
RDW: 15.6 % — ABNORMAL HIGH (ref 11.5–15.5)
WBC: 6.6 10*3/uL (ref 4.0–10.5)

## 2020-03-08 LAB — COMPREHENSIVE METABOLIC PANEL
ALT: 24 U/L (ref 0–53)
AST: 21 U/L (ref 0–37)
Albumin: 4.3 g/dL (ref 3.5–5.2)
Alkaline Phosphatase: 52 U/L (ref 39–117)
BUN: 21 mg/dL (ref 6–23)
CO2: 25 mEq/L (ref 19–32)
Calcium: 9.3 mg/dL (ref 8.4–10.5)
Chloride: 105 mEq/L (ref 96–112)
Creatinine, Ser: 1.01 mg/dL (ref 0.40–1.50)
GFR: 75.81 mL/min (ref >60.00)
Glucose, Bld: 108 mg/dL — ABNORMAL HIGH (ref 70–99)
Potassium: 4.5 mEq/L (ref 3.5–5.1)
Sodium: 138 mEq/L (ref 135–145)
Total Bilirubin: 0.3 mg/dL (ref 0.2–1.2)
Total Protein: 6.7 g/dL (ref 6.0–8.3)

## 2020-03-08 LAB — PSA: PSA: 18.27 ng/mL — ABNORMAL HIGH (ref 0.10–4.00)

## 2020-03-08 NOTE — Patient Instructions (Signed)
Dizziness Dizziness is a common problem. It is a feeling of unsteadiness or light-headedness. You may feel like you are about to faint. Dizziness can lead to injury if you stumble or fall. Anyone can become dizzy, but dizziness is more common in older adults. This condition can be caused by a number of things, including medicines, dehydration, or illness. Follow these instructions at home: Eating and drinking  Drink enough fluid to keep your urine clear or pale yellow. This helps to keep you from becoming dehydrated. Try to drink more clear fluids, such as water.  Do not drink alcohol.  Limit your caffeine intake if told to do so by your health care provider. Check ingredients and nutrition facts to see if a food or beverage contains caffeine.  Limit your salt (sodium) intake if told to do so by your health care provider. Check ingredients and nutrition facts to see if a food or beverage contains sodium. Activity  Avoid making quick movements. ? Rise slowly from chairs and steady yourself until you feel okay. ? In the morning, first sit up on the side of the bed. When you feel okay, stand slowly while you hold onto something until you know that your balance is fine.  If you need to stand in one place for a long time, move your legs often. Tighten and relax the muscles in your legs while you are standing.  Do not drive or use heavy machinery if you feel dizzy.  Avoid bending down if you feel dizzy. Place items in your home so that they are easy for you to reach without leaning over. Lifestyle  Do not use any products that contain nicotine or tobacco, such as cigarettes and e-cigarettes. If you need help quitting, ask your health care provider.  Try to reduce your stress level by using methods such as yoga or meditation. Talk with your health care provider if you need help to manage your stress. General instructions  Watch your dizziness for any changes.  Take over-the-counter and  prescription medicines only as told by your health care provider. Talk with your health care provider if you think that your dizziness is caused by a medicine that you are taking.  Tell a friend or a family member that you are feeling dizzy. If he or she notices any changes in your behavior, have this person call your health care provider.  Keep all follow-up visits as told by your health care provider. This is important. Contact a health care provider if:  Your dizziness does not go away.  Your dizziness or light-headedness gets worse.  You feel nauseous.  You have reduced hearing.  You have new symptoms.  You are unsteady on your feet or you feel like the room is spinning. Get help right away if:  You vomit or have diarrhea and are unable to eat or drink anything.  You have problems talking, walking, swallowing, or using your arms, hands, or legs.  You feel generally weak.  You are not thinking clearly or you have trouble forming sentences. It may take a friend or family member to notice this.  You have chest pain, abdominal pain, shortness of breath, or sweating.  Your vision changes.  You have any bleeding.  You have a severe headache.  You have neck pain or a stiff neck.  You have a fever. These symptoms may represent a serious problem that is an emergency. Do not wait to see if the symptoms will go away. Get medical help   right away. Call your local emergency services (911 in the [place] Do not drive yourself to the hospital. Summary  Dizziness is a feeling of unsteadiness or light-headedness. This condition can be caused by a number of things, including medicines, dehydration, or illness.  Anyone can become dizzy, but dizziness is more common in older adults.  Drink enough fluid to keep your urine clear or pale yellow. Do not drink alcohol.  Avoid making quick movements if you feel dizzy. Monitor your dizziness for any changes. This information is not intended to  replace advice given to you by your health care provider. Make sure you discuss any questions you have with your health care provider. Document Revised: 01/04/2017 Document Reviewed: 02/04/2016 Elsevier Patient Education  2021 [employer]  

## 2020-03-08 NOTE — Progress Notes (Signed)
Established Patient Office Visit  Subjective:  Patient ID: Allen [name], male    DOB: 1950/07/02  Age: 70 y.o. MRN: [id number]  CC:  Chief Complaint  Patient presents with  . Fatigue    Patient states been fatigued since January 6th, given antibiotics and injections for prostate infection.Denies any current symptoms.  . Dizziness    Patient states symptoms started since January 6th,     HPI Allen [name] presents for complaints of some dizziness and fatigue over the past several weeks.  He has chronic problems including diastolic heart failure, sleep apnea, COPD, essential tremor, elevated PSA with previous biopsies negative for cancer.  He states that back in January he went into see neurosurgeon for brain stimulator check shortly after that his wife was diagnosed with Covid infection back in early January.  He developed fever 102 back in early January but Covid test was negative.  He had up having some blood in his urine and was seen in consultation by urologist diagnosed with a prostate infection.  He received some type of intramuscular antibiotic injection followed by oral antibiotics.  He improved within the end of January had recurrent fever and recurrent blood in the urine.  He was again treated with antibiotics per urology and improved.  He has not had any further urinary symptoms but has had some lightheadedness and fatigue.  No chest pains.  No dyspnea.  He does also relate that over the past couple months he is also most 40 pounds which has been intentional.  He is doing Rickard Patience diet 1500 cal/day.  Does not add a lot of sodium.  He relates that about 3 years ago in [place] he had cardiac cath which showed normal coronary arteries.  He does not take any blood pressure medications.  He does take Flomax which can have some effect on blood pressure.  His dizziness is worse when he first stands up and then generally improves after several seconds.  No palpitations.  No syncope.   Patient is concerned that his blood counts may have dropped because of the recent blood in his urine but he states his blood loss in the urine seem to be relatively mild.  He has scheduled follow-up with urologist in April and is requesting repeat PSA today  Past Medical History:  Diagnosis Date  . Anxiety   . Arthritis    neck - no meds  . Asthma   . Bilateral cataracts    removed by surgery  . COPD (chronic obstructive pulmonary disease) ([place])   . Dyspnea    occasional with exertion  . ED (erectile dysfunction)   . Essential tremor    with nerve stimulator  . GERD (gastroesophageal reflux disease)   . Insomnia   . OSA treated with BiPAP    does not use bipap - uses 2 L oxygen via [place] while sleeping  . Recurrent upper respiratory infection (URI)   . Right carpal tunnel syndrome     Past Surgical History:  Procedure Laterality Date  . ADENOIDECTOMY    . ANKLE SURGERY Right   . APPENDECTOMY  2015  . CARPAL TUNNEL RELEASE Left 2019  . CATARACT EXTRACTION Bilateral 2020  . COLONOSCOPY    . DEEP BRAIN STIMULATOR PLACEMENT Left 2016  . HERNIA REPAIR Right 2007  . ROTATOR CUFF REPAIR Right 2014  . SUBTHALAMIC STIMULATOR BATTERY REPLACEMENT N/A 01/21/2020   Procedure: Deep brain stimulator battery replacement;  Surgeon: Erline Levine, MD;  Location: [facility];  Service: Neurosurgery;  Laterality: N/A;  . TONSILLECTOMY    . TOTAL SHOULDER REPLACEMENT Left 2016  . VEIN SURGERY Bilateral 2009   varicose veins- bilateral    Family History  Problem Relation Age of Onset  . COPD Mother   . Asthma Father   . Rectal cancer Sister   . Healthy Child   . Urticaria Neg Hx   . Immunodeficiency Neg Hx   . Eczema Neg Hx   . Atopy Neg Hx   . Angioedema Neg Hx   . Allergic rhinitis Neg Hx     Social History   Socioeconomic History  . Marital status: Married    Spouse name: Not on file  . Number of children: 3  . Years of education: Not on file  . Highest education level: Associate  degree: occupational, Hotel manager, or vocational program  Occupational History  . Not on file  Tobacco Use  . Smoking status: Former Smoker    Packs/day: 3.00    Years: 20.00    Pack years: 60.00    Types: Cigarettes    Quit date: 01/15/1989    Years since quitting: 31.1  . Smokeless tobacco: Former Network engineer  . Vaping Use: Never used  Substance and Sexual Activity  . Alcohol use: Not Currently    Alcohol/week: 14.0 standard drinks    Types: 14 Glasses of wine per week    Comment: 1 glass wine occassionally  . Drug use: Never  . Sexual activity: Yes  Other Topics Concern  . Not on file  Social History Narrative  . Not on file   Social Determinants of Health   Financial Resource Strain: Not on file  Food Insecurity: Not on file  Transportation Needs: Not on file  Physical Activity: Not on file  Stress: Not on file  Social Connections: Not on file  Intimate Partner Violence: Not on file    Outpatient Medications Prior to Visit  Medication Sig Dispense Refill  . albuterol (PROVENTIL) (2.5 MG/3ML) 0.083% nebulizer solution Take 3 mLs (2.5 mg total) by nebulization every 6 (six) hours. 150 mL 3  . albuterol (VENTOLIN HFA) 108 (90 Base) MCG/ACT inhaler Inhale 2 puffs into the lungs every 4 (four) hours as needed for wheezing or shortness of breath. 18 g 1  . Ascorbic Acid (VITAMIN C) 1000 MG tablet Take 1,000 mg by mouth daily.    [name] Kitchen azelastine (ASTELIN) 0.1 % nasal spray Place 1-2 sprays into both nostrils 2 (two) times daily as needed for rhinitis. 30 mL 5  . benzonatate (TESSALON) 100 MG capsule TAKE 1 CAPSULE (100 MG TOTAL) BY MOUTH 2 (TWO) TIMES DAILY AS NEEDED FOR COUGH. 30 capsule 0  . escitalopram (LEXAPRO) 10 MG tablet Take 10 mg by mouth daily.    . fluticasone (FLONASE) 50 MCG/ACT nasal spray Place 2 sprays into both nostrils daily as needed for allergies or rhinitis. 16 g 5  . Fluticasone-Umeclidin-Vilant (TRELEGY ELLIPTA) 100-62.5-25 MCG/INH AEPB Inhale 1 puff  into the lungs daily. 28 each 0  . folic acid (FOLVITE) 774 MCG tablet Take 400 mcg by mouth daily.    . montelukast (SINGULAIR) 10 MG tablet Take 1 tablet (10 mg total) by mouth at bedtime. 90 tablet 1  . omeprazole (PRILOSEC) 20 MG capsule TAKE 1 CAPSULE BY MOUTH EVERY DAY 90 capsule 0  . tadalafil (CIALIS) 20 MG tablet Take 1 tablet (20 mg total) by mouth daily as needed for erectile dysfunction. (Patient taking differently: Take 10 mg  by mouth daily.) 90 tablet 3  . tamsulosin (FLOMAX) 0.4 MG CAPS capsule Take 0.4 mg by mouth daily.    [name] Kitchen tiZANidine (ZANAFLEX) 2 MG tablet TAKE 1 TABLET BY MOUTH AT BEDTIME. 30 tablet 0  . traZODone (DESYREL) 100 MG tablet Take 100 mg by mouth at bedtime.    . Vitamin D, Ergocalciferol, (DRISDOL) 1.25 MG (50000 UNIT) CAPS capsule Take 1 capsule (50,000 Units total) by mouth every 7 (seven) days for 12 doses. 12 capsule 0  . Azelastine-Fluticasone 137-50 MCG/ACT SUSP Place 1 spray into both nostrils 2 (two) times daily as needed. 23 g 5  . QUEtiapine (SEROQUEL) 25 MG tablet Take 25 mg by mouth at bedtime.    . traMADol (ULTRAM) 50 MG tablet Take 1 tablet (50 mg total) by mouth every 6 (six) hours as needed for moderate pain. 20 tablet 0   No facility-administered medications prior to visit.    No Known Allergies  ROS Review of Systems  Constitutional: Positive for fatigue. Negative for chills and fever.  Respiratory: Negative for chest tightness and shortness of breath.   Cardiovascular: Negative for chest pain.  Gastrointestinal: Negative for abdominal pain.  Genitourinary: Negative for difficulty urinating and dysuria.  Neurological: Positive for dizziness and light-headedness. Negative for seizures, syncope, speech difficulty and headaches.  Hematological: Negative for adenopathy.  Psychiatric/Behavioral: Negative for confusion.      Objective:    Physical Exam Vitals reviewed.  Constitutional:      Appearance: Normal appearance.   Cardiovascular:     Rate and Rhythm: Normal rate and regular rhythm.  Pulmonary:     Effort: Pulmonary effort is normal.     Breath sounds: Normal breath sounds.  Neurological:     General: No focal deficit present.     Mental Status: He is alert and oriented to person, place, and time.     Cranial Nerves: No cranial nerve deficit.     Motor: No weakness.     Gait: Gait normal.     Comments: He has asymmetric tremor with left upper extremity greater than right which is chronic  No focal weakness     BP 102/70 (BP Location: Left Arm, Cuff Size: Normal)   Pulse 82   Temp 98.3 F (36.8 C) (Oral)   Ht 5\' 10"  (1.778 m)   Wt 227 lb 11.2 oz (103.3 kg)   SpO2 97%   BMI 32.67 kg/m  Wt Readings from Last 3 Encounters:  03/08/20 227 lb 11.2 oz (103.3 kg)  03/02/20 228 lb (103.4 kg)  02/05/20 235 lb (106.6 kg)     Health Maintenance Due  Topic Date Due  . Hepatitis C Screening  Never done  . PNA vac Low Risk Adult (2 of 2 - PCV13) 12/16/2018    There are no preventive care reminders to display for this patient.  Lab Results  Component Value Date   TSH 1.54 05/06/2019   Lab Results  Component Value Date   WBC 6.5 05/06/2019   HGB 13.2 05/06/2019   HCT 39.9 05/06/2019   MCV 95.0 05/06/2019   PLT 304.0 05/06/2019   Lab Results  Component Value Date   NA 138 05/06/2019   K 4.5 05/06/2019   CO2 28 05/06/2019   GLUCOSE 100 (H) 05/06/2019   BUN 20 05/06/2019   CREATININE 0.98 05/06/2019   BILITOT 0.5 05/06/2019   ALKPHOS 50 05/06/2019   AST 19 05/06/2019   ALT 18 05/06/2019   PROT 6.5 05/06/2019  ALBUMIN 4.4 05/06/2019   CALCIUM 9.1 05/06/2019   GFR 75.88 05/06/2019   Lab Results  Component Value Date   CHOL 171 05/06/2019   Lab Results  Component Value Date   HDL 62.90 05/06/2019   Lab Results  Component Value Date   LDLCALC 86 05/06/2019   Lab Results  Component Value Date   TRIG 107.0 05/06/2019   Lab Results  Component Value Date   CHOLHDL 3  05/06/2019   Lab Results  Component Value Date   HGBA1C 5.6 05/06/2019      Assessment & Plan:   #1 patient presents with several week history of increased fatigue and lightheadedness and dizziness with standing.  This is usually fairly transient.  He does have somewhat low blood pressure today of 102/70 seated but this was exactly the same standing.  He did have some mild subjective lightheadedness which lasted just a few seconds.  Suspect his recent weight loss of reported 40 pounds in 2 months may be contributing.  He is also been fairly restrictive of sodium intake.  -Check CBC and comprehensive metabolic panel -Liberalize sodium intake slightly -Change positions slowly  #2 history of elevated PSA with reported negative biopsies recently.  Patient requesting repeat PSA today  No orders of the defined types were placed in this encounter.   Follow-up: No follow-ups on file.    Carolann Littler, MD

## 2020-03-09 ENCOUNTER — Ambulatory Visit: Payer: Medicare HMO | Admitting: Family Medicine

## 2020-03-09 ENCOUNTER — Encounter: Payer: Self-pay | Admitting: Primary Care

## 2020-03-09 ENCOUNTER — Encounter: Payer: Self-pay | Admitting: Family Medicine

## 2020-03-09 ENCOUNTER — Ambulatory Visit: Payer: Medicare HMO | Admitting: Primary Care

## 2020-03-09 DIAGNOSIS — J418 Mixed simple and mucopurulent chronic bronchitis: Secondary | ICD-10-CM | POA: Diagnosis not present

## 2020-03-09 DIAGNOSIS — Z9189 Other specified personal risk factors, not elsewhere classified: Secondary | ICD-10-CM | POA: Diagnosis not present

## 2020-03-09 DIAGNOSIS — H26492 Other secondary cataract, left eye: Secondary | ICD-10-CM | POA: Diagnosis not present

## 2020-03-09 DIAGNOSIS — H52203 Unspecified astigmatism, bilateral: Secondary | ICD-10-CM | POA: Diagnosis not present

## 2020-03-09 DIAGNOSIS — H0288A Meibomian gland dysfunction right eye, upper and lower eyelids: Secondary | ICD-10-CM | POA: Diagnosis not present

## 2020-03-09 DIAGNOSIS — R911 Solitary pulmonary nodule: Secondary | ICD-10-CM

## 2020-03-09 DIAGNOSIS — H0288B Meibomian gland dysfunction left eye, upper and lower eyelids: Secondary | ICD-10-CM | POA: Diagnosis not present

## 2020-03-09 DIAGNOSIS — H353131 Nonexudative age-related macular degeneration, bilateral, early dry stage: Secondary | ICD-10-CM | POA: Diagnosis not present

## 2020-03-09 DIAGNOSIS — H524 Presbyopia: Secondary | ICD-10-CM | POA: Diagnosis not present

## 2020-03-09 DIAGNOSIS — Z961 Presence of intraocular lens: Secondary | ICD-10-CM | POA: Diagnosis not present

## 2020-03-09 DIAGNOSIS — H04123 Dry eye syndrome of bilateral lacrimal glands: Secondary | ICD-10-CM | POA: Diagnosis not present

## 2020-03-09 MED ORDER — TRELEGY ELLIPTA 200-62.5-25 MCG/INH IN AEPB: 1.0000 | INHALATION_SPRAY | Freq: Every day | RESPIRATORY_TRACT | 0 refills | Status: DC

## 2020-03-09 NOTE — Patient Instructions (Addendum)
   Recommendations: - Trial sample Trelegy 200- one puff daily in morning (if you notice significant improvement we can send in RX or you can go back to regular dose) - Continue Singulair 10mg  at bedtime - Due for CT chest in April 2022 (this has already been ordered, they will contact you about 4 weeks prior to schedule) - Discuss anemia with PCP and elevated PSA with urology   Follow-up: - 3 months with Dr. Halford [name]

## 2020-03-09 NOTE — Progress Notes (Signed)
Reviewed and agree with assessment/plan.   Chesley Mires, MD [facility] Pulmonary/Critical Care 03/09/2020, 12:49 PM Pager:  [phone]

## 2020-03-09 NOTE — Progress Notes (Signed)
Mychart message sent: Chemistries are normal with exception of mildly elevated glucose.  His PSA is very high which may reflect inflammation of the prostate from recent biopsies.  He is encouraged to continue close follow-up with urology regarding that.  His hemoglobin is slightly low compared to 10 months ago at 11.9.  Suggest follow-up labs with ferritin, serum iron, TIBC, repeat CBC within the next few weeks

## 2020-03-09 NOTE — Progress Notes (Signed)
@Patient  ID: Allen [name], male    DOB: 11-17-1950, 70 y.o.   MRN: [id number]  Chief Complaint  Patient presents with  . Follow-up    Sob has increased over past month. 2L @ night. Wondering if cat scan could be ordered and if trelegy could be increased to 200. DME- [employer]    Referring provider: Isaac Bliss, [place]*  HPI: 70 year old male, former smoker quit 1991 (60-pack-year history).  Past medical history significant for COPD with asthma, emphysema, lung nodule, OSA on BiPAP, sleep-related hypoxia, allergic rhinitis, chronic diastolic heart failure, GERD, ulcerative colitis, essential tremor, degenerative disc disease, vitamin D deficiency.  Patient of Dr. Halford Chessman, last seen on 12/30/19.  03/09/2020  Patient presents today for 47-month follow-up. Last month and a half he has not been doing well. He had battery changed on his brain stimulator in January. Shortly after this his wife tested positive for covid. He developed a fever and O2 was 89% RA. He was vaccinated and tested negative several times. He then noticed some blood in his urine and was found to have a prostate infection. He has been on antibiotic for the last month per urology. Previous prostate biopsies were negative in the past. PSA is currently 18. He has an apt with urology this Friday. Labs that were obtained yesterday at PCP were reviewed. WBC was normal, Hgb was around 11 and glucose was in 100s.  He has noticed that his breathing has been effected. He reports increased chest tightness and shortness of breath. He has dry cough. He is maintained on Trelegy 100 and Singulair. Breztri and Symbicort were ineffective and too expensive. He wears 2 L of oxygen at night.  Patient due for follow-up CT chest without contrast in April 2022 to follow-up on lung nodule (this has already been ordered).    No Known Allergies  Immunization History  Administered Date(s) Administered  . Fluad Quad(high Dose 65+) 09/18/2018, 11/10/2019  .  PFIZER(Purple Top)SARS-COV-2 Vaccination 02/20/2019, 03/17/2019, 10/03/2019  . Pneumococcal Polysaccharide-23 01/08/2015  . Pneumococcal-Unspecified 12/15/2017  . Tdap 09/17/2016  . Zoster Recombinat (Shingrix) 06/06/2019, 09/05/2019    Past Medical History:  Diagnosis Date  . Anxiety   . Arthritis    neck - no meds  . Asthma   . Bilateral cataracts    removed by surgery  . COPD (chronic obstructive pulmonary disease) ([place])   . Dyspnea    occasional with exertion  . ED (erectile dysfunction)   . Essential tremor    with nerve stimulator  . GERD (gastroesophageal reflux disease)   . Insomnia   . OSA treated with BiPAP    does not use bipap - uses 2 L oxygen via [place] while sleeping  . Recurrent upper respiratory infection (URI)   . Right carpal tunnel syndrome     Tobacco History: Social History   Tobacco Use  Smoking Status Former Smoker  . Packs/day: 3.00  . Years: 20.00  . Pack years: 60.00  . Types: Cigarettes  . Quit date: 01/15/1989  . Years since quitting: 31.1  Smokeless Tobacco Former Air traffic controller given: Not Answered   Outpatient Medications Prior to Visit  Medication Sig Dispense Refill  . albuterol (PROVENTIL) (2.5 MG/3ML) 0.083% nebulizer solution Take 3 mLs (2.5 mg total) by nebulization every 6 (six) hours. 150 mL 3  . albuterol (VENTOLIN HFA) 108 (90 Base) MCG/ACT inhaler Inhale 2 puffs into the lungs every 4 (four) hours as needed for wheezing or shortness of breath. 18 g  1  . Ascorbic Acid (VITAMIN C) 1000 MG tablet Take 1,000 mg by mouth daily.    [name] Kitchen azelastine (ASTELIN) 0.1 % nasal spray Place 1-2 sprays into both nostrils 2 (two) times daily as needed for rhinitis. 30 mL 5  . benzonatate (TESSALON) 100 MG capsule TAKE 1 CAPSULE (100 MG TOTAL) BY MOUTH 2 (TWO) TIMES DAILY AS NEEDED FOR COUGH. 30 capsule 0  . escitalopram (LEXAPRO) 10 MG tablet Take 10 mg by mouth daily.    . fluticasone (FLONASE) 50 MCG/ACT nasal spray Place 2 sprays into both  nostrils daily as needed for allergies or rhinitis. 16 g 5  . Fluticasone-Umeclidin-Vilant (TRELEGY ELLIPTA) 100-62.5-25 MCG/INH AEPB Inhale 1 puff into the lungs daily. 28 each 0  . folic acid (FOLVITE) 102 MCG tablet Take 400 mcg by mouth daily.    . montelukast (SINGULAIR) 10 MG tablet Take 1 tablet (10 mg total) by mouth at bedtime. 90 tablet 1  . omeprazole (PRILOSEC) 20 MG capsule TAKE 1 CAPSULE BY MOUTH EVERY DAY 90 capsule 0  . tadalafil (CIALIS) 20 MG tablet Take 1 tablet (20 mg total) by mouth daily as needed for erectile dysfunction. (Patient taking differently: Take 10 mg by mouth daily.) 90 tablet 3  . tamsulosin (FLOMAX) 0.4 MG CAPS capsule Take 0.4 mg by mouth daily.    [name] Kitchen tiZANidine (ZANAFLEX) 2 MG tablet TAKE 1 TABLET BY MOUTH AT BEDTIME. 30 tablet 0  . traZODone (DESYREL) 100 MG tablet Take 100 mg by mouth at bedtime.    . Vitamin D, Ergocalciferol, (DRISDOL) 1.25 MG (50000 UNIT) CAPS capsule Take 1 capsule (50,000 Units total) by mouth every 7 (seven) days for 12 doses. 12 capsule 0   No facility-administered medications prior to visit.   Review of Systems  Review of Systems  Constitutional: Positive for fatigue.  Respiratory: Positive for cough, chest tightness and shortness of breath. Negative for wheezing.   Cardiovascular: Negative.    Physical Exam  BP 132/82 (BP Location: Left Arm, Cuff Size: Normal)   Pulse 80   Temp 97.6 F (36.4 C)   Ht 5\' 11"  (1.803 m)   Wt 226 lb (102.5 kg)   SpO2 96%   BMI 31.52 kg/m  Physical Exam Constitutional:      General: He is not in acute distress.    Appearance: Normal appearance. He is not toxic-appearing or diaphoretic.  HENT:     Head: Normocephalic and atraumatic.     Mouth/Throat:     Comments: Deferred d/t masking Cardiovascular:     Rate and Rhythm: Normal rate and regular rhythm.  Pulmonary:     Effort: Pulmonary effort is normal.     Breath sounds: Normal breath sounds.     Comments: CTA Musculoskeletal:         General: Normal range of motion.  Skin:    General: Skin is warm and dry.  Neurological:     General: No focal deficit present.     Mental Status: He is alert and oriented to person, place, and time. Mental status is at baseline.  Psychiatric:        Behavior: Behavior normal.        Thought Content: Thought content normal.        Judgment: Judgment normal.     Comments: Anxious      Lab Results:  CBC    Component Value Date/Time   WBC 6.6 03/08/2020 1203   RBC 3.81 (L) 03/08/2020 1203   HGB 11.9 (  L) 03/08/2020 1203   HCT 35.5 (L) 03/08/2020 1203   PLT 296.0 03/08/2020 1203   MCV 93.0 03/08/2020 1203   MCHC 33.6 03/08/2020 1203   RDW 15.6 (H) 03/08/2020 1203   LYMPHSABS 1.3 03/08/2020 1203   MONOABS 0.5 03/08/2020 1203   EOSABS 0.1 03/08/2020 1203   BASOSABS 0.1 03/08/2020 1203    BMET    Component Value Date/Time   NA 138 03/08/2020 1203   K 4.5 03/08/2020 1203   CL 105 03/08/2020 1203   CO2 25 03/08/2020 1203   GLUCOSE 108 (H) 03/08/2020 1203   BUN 21 03/08/2020 1203   CREATININE 1.01 03/08/2020 1203   CALCIUM 9.3 03/08/2020 1203    BNP No results found for: BNP  ProBNP    Component Value Date/Time   PROBNP 33.0 04/10/2019 0941    Imaging: No results found.   Assessment & Plan:   COPD (chronic obstructive pulmonary disease) ([place]) - Symptoms do not appear overtly exacerbated today. Lungs were clear on exam. VSS; O2 96% on RA. No significant cough with purulent mucus production. Increased shortness of breath likely d/t anxiety and recent prostate infection. He is slightly anemic with Hgb around 11. WBCs were normal. PSA remains significantly elevated at 18. No indication for additional antibiotics on our end. We will trial higher dose Trelegy 254mcg daily. If patient notices improvement in breathing we will send in RX. He will follow-up with PCP regarding anemia and urology regarding elevated PSA.   Pulmonary nodule less than 6 mm in diameter with  high risk for malignant neoplasm - Pulmonary nodules measure 4 mm or less in size. He is considered high risk d/t past smoking history. Due for repeat CT chest in April 2022, this has already been ordered and will be scheduled in the next 2-4 weeks   Martyn Ehrich, NP 03/09/2020

## 2020-03-09 NOTE — Assessment & Plan Note (Addendum)
-   Symptoms do not appear overtly exacerbated today. Lungs were clear on exam. VSS; O2 96% on RA. No significant cough with purulent mucus production. Increased shortness of breath likely d/t anxiety and recent prostate infection. He is slightly anemic with Hgb around 11. WBCs were normal. PSA remains significantly elevated at 18. No indication for additional antibiotics on our end. We will trial higher dose Trelegy 261mcg daily. If patient notices improvement in breathing we will send in RX. He will follow-up with PCP regarding anemia and urology regarding elevated PSA.

## 2020-03-09 NOTE — Assessment & Plan Note (Signed)
-   Pulmonary nodules measure 4 mm or less in size. He is considered high risk d/t past smoking history. Due for repeat CT chest in April 2022, this has already been ordered and will be scheduled in the next 2-4 weeks

## 2020-03-10 ENCOUNTER — Other Ambulatory Visit: Payer: Self-pay

## 2020-03-10 ENCOUNTER — Telehealth: Payer: Self-pay

## 2020-03-10 ENCOUNTER — Telehealth: Payer: Self-pay | Admitting: Internal Medicine

## 2020-03-10 DIAGNOSIS — R972 Elevated prostate specific antigen [PSA]: Secondary | ICD-10-CM

## 2020-03-10 DIAGNOSIS — Z0100 Encounter for examination of eyes and vision without abnormal findings: Secondary | ICD-10-CM | POA: Diagnosis not present

## 2020-03-10 NOTE — Telephone Encounter (Signed)
Spoke with patient.  Reviewed lab results.  Patient has scheduled an appointment with Dr Jerilee Hoh and will have his lab done then.

## 2020-03-10 NOTE — Telephone Encounter (Signed)
Patient is calling and is requesting a call back regarding lab results, please advise. CB is [phone]

## 2020-03-10 NOTE — Telephone Encounter (Signed)
Prior colonoscopy reviewed  Performed June 21, 2017 - Dr. Alease Frame - [facility] - 3 x 1-59mm polyps in the colon. Small internal hemorrhoids. No path available with this. They recommended a repeat colonoscopy in 5 years, so due 06/2022.  Not sure why he is requesting a colonoscopy now? Is there another reason for the referral? I don't think he needs another colonoscopy now but if there are other GI issues he wishes to have addressed I'm happy to see him.

## 2020-03-10 NOTE — Telephone Encounter (Signed)
Hey Dr Havery Moros, this pt is being referred for a colonoscopy from [facility] but it looks like the pt had one done in 2019, I will send the records to you for review, please advise on scheduling.

## 2020-03-11 ENCOUNTER — Encounter: Payer: Self-pay | Admitting: Internal Medicine

## 2020-03-11 ENCOUNTER — Ambulatory Visit (INDEPENDENT_AMBULATORY_CARE_PROVIDER_SITE_OTHER): Payer: Medicare HMO | Admitting: Internal Medicine

## 2020-03-11 VITALS — BP 120/70 | HR 75 | Temp 98.4°F | Wt 226.4 lb

## 2020-03-11 DIAGNOSIS — R972 Elevated prostate specific antigen [PSA]: Secondary | ICD-10-CM | POA: Diagnosis not present

## 2020-03-11 DIAGNOSIS — R059 Cough, unspecified: Secondary | ICD-10-CM

## 2020-03-11 DIAGNOSIS — N410 Acute prostatitis: Secondary | ICD-10-CM

## 2020-03-11 DIAGNOSIS — R5383 Other fatigue: Secondary | ICD-10-CM

## 2020-03-11 MED ORDER — BENZONATATE 200 MG PO CAPS
200.0000 mg | ORAL_CAPSULE | Freq: Two times a day (BID) | ORAL | 0 refills | Status: DC | PRN
Start: 2020-03-11 — End: 2020-06-15

## 2020-03-11 NOTE — Progress Notes (Signed)
Established Patient Office Visit     This visit occurred during the SARS-CoV-2 public health emergency.  Safety protocols were in place, including screening questions prior to the visit, additional usage of staff PPE, and extensive cleaning of exam room while observing appropriate contact time as indicated for disinfecting solutions.    CC/Reason for Visit: Follow-up  HPI: Irma Delancey is a 70 y.o. male who is coming in today for the above mentioned reasons.  Since I last saw him he has had some significant changes to his medical history.  On January 6 he had his brain stimulator battery exchange.  Shortly thereafter his wife tested positive for Covid.  Despite repeated testing he was negative.  He was concerned because he started developing a significant cough, fatigue and temps of 102.  When he noticed blood in his urine he went to his urologist.  He was diagnosed with prostatitis and was started on antibiotics in addition to IM Rocephin given in the office.  He completed 2 weeks of antibiotics.  A few days afterwards he drove to [facility] and back for his son's wedding.  He again had another flareup of his prostatitis following that and recently completed his second 2 weeks course of antibiotics.  He is still fatigued and dizzy at times.  He has also lost over 40 pounds in 2-1/2 months following [place].  Labs done recently were normal.  He saw his pulmonologist and everything seemed okay.  At nighttime he sometimes has a cough.  He is requesting a refill of his Tessalon Perles.   Past Medical/Surgical History: Past Medical History:  Diagnosis Date  . Anxiety   . Arthritis    neck - no meds  . Asthma   . Bilateral cataracts    removed by surgery  . COPD (chronic obstructive pulmonary disease) ([place])   . Dyspnea    occasional with exertion  . ED (erectile dysfunction)   . Essential tremor    with nerve stimulator  . GERD (gastroesophageal reflux disease)   .  Insomnia   . OSA treated with BiPAP    does not use bipap - uses 2 L oxygen via [place] while sleeping  . Recurrent upper respiratory infection (URI)   . Right carpal tunnel syndrome     Past Surgical History:  Procedure Laterality Date  . ADENOIDECTOMY    . ANKLE SURGERY Right   . APPENDECTOMY  2015  . CARPAL TUNNEL RELEASE Left 2019  . CATARACT EXTRACTION Bilateral 2020  . COLONOSCOPY    . DEEP BRAIN STIMULATOR PLACEMENT Left 2016  . HERNIA REPAIR Right 2007  . ROTATOR CUFF REPAIR Right 2014  . SUBTHALAMIC STIMULATOR BATTERY REPLACEMENT N/A 01/21/2020   Procedure: Deep brain stimulator battery replacement;  Surgeon: Erline Levine, MD;  Location: [facility];  Service: Neurosurgery;  Laterality: N/A;  . TONSILLECTOMY    . TOTAL SHOULDER REPLACEMENT Left 2016  . VEIN SURGERY Bilateral 2009   varicose veins- bilateral    Social History:  reports that he quit smoking about 31 years ago. His smoking use included cigarettes. He has a 60.00 pack-year smoking history. He has quit using smokeless tobacco. He reports previous alcohol use of about 14.0 standard drinks of alcohol per week. He reports that he does not use drugs.  Allergies: No Known Allergies  Family History:  Family History  Problem Relation Age of Onset  . COPD Mother   . Asthma Father   . Rectal cancer  Sister   . Healthy Child   . Urticaria Neg Hx   . Immunodeficiency Neg Hx   . Eczema Neg Hx   . Atopy Neg Hx   . Angioedema Neg Hx   . Allergic rhinitis Neg Hx      Current Outpatient Medications:  .  albuterol (PROVENTIL) (2.5 MG/3ML) 0.083% nebulizer solution, Take 3 mLs (2.5 mg total) by nebulization every 6 (six) hours., Disp: 150 mL, Rfl: 3 .  albuterol (VENTOLIN HFA) 108 (90 Base) MCG/ACT inhaler, Inhale 2 puffs into the lungs every 4 (four) hours as needed for wheezing or shortness of breath., Disp: 18 g, Rfl: 1 .  Ascorbic Acid (VITAMIN C) 1000 MG tablet, Take 1,000 mg by mouth daily., Disp: , Rfl:  .  azelastine  (ASTELIN) 0.1 % nasal spray, Place 1-2 sprays into both nostrils 2 (two) times daily as needed for rhinitis., Disp: 30 mL, Rfl: 5 .  benzonatate (TESSALON) 200 MG capsule, Take 1 capsule (200 mg total) by mouth 2 (two) times daily as needed for cough., Disp: 20 capsule, Rfl: 0 .  escitalopram (LEXAPRO) 10 MG tablet, Take 10 mg by mouth daily., Disp: , Rfl:  .  fluticasone (FLONASE) 50 MCG/ACT nasal spray, Place 2 sprays into both nostrils daily as needed for allergies or rhinitis., Disp: 16 g, Rfl: 5 .  Fluticasone-Umeclidin-Vilant (TRELEGY ELLIPTA) 100-62.5-25 MCG/INH AEPB, Inhale 1 puff into the lungs daily., Disp: 28 each, Rfl: 0 .  Fluticasone-Umeclidin-Vilant (TRELEGY ELLIPTA) 200-62.5-25 MCG/INH AEPB, Inhale 1 puff into the lungs daily., Disp: 1 each, Rfl: 0 .  folic acid (FOLVITE) 751 MCG tablet, Take 400 mcg by mouth daily., Disp: , Rfl:  .  montelukast (SINGULAIR) 10 MG tablet, Take 1 tablet (10 mg total) by mouth at bedtime., Disp: 90 tablet, Rfl: 1 .  omeprazole (PRILOSEC) 20 MG capsule, TAKE 1 CAPSULE BY MOUTH EVERY DAY, Disp: 90 capsule, Rfl: 0 .  tadalafil (CIALIS) 20 MG tablet, Take 1 tablet (20 mg total) by mouth daily as needed for erectile dysfunction. (Patient taking differently: Take 10 mg by mouth daily.), Disp: 90 tablet, Rfl: 3 .  tiZANidine (ZANAFLEX) 2 MG tablet, TAKE 1 TABLET BY MOUTH AT BEDTIME., Disp: 30 tablet, Rfl: 0 .  traZODone (DESYREL) 100 MG tablet, Take 100 mg by mouth at bedtime., Disp: , Rfl:  .  Vitamin D, Ergocalciferol, (DRISDOL) 1.25 MG (50000 UNIT) CAPS capsule, Take 1 capsule (50,000 Units total) by mouth every 7 (seven) days for 12 doses., Disp: 12 capsule, Rfl: 0 .  tamsulosin (FLOMAX) 0.4 MG CAPS capsule, Take 0.4 mg by mouth daily. (Patient not taking: Reported on 03/11/2020), Disp: , Rfl:   Review of Systems:  Constitutional: Denies fever, chills, diaphoresis, appetite change.  HEENT: Denies photophobia, eye pain, redness, hearing loss, ear pain,  congestion, sore throat, rhinorrhea, sneezing, mouth sores, trouble swallowing, neck pain, neck stiffness and tinnitus.   Respiratory: Denies SOB, DOE, chest tightness,  and wheezing.   Cardiovascular: Denies chest pain, palpitations and leg swelling.  Gastrointestinal: Denies nausea, vomiting, abdominal pain, diarrhea, constipation, blood in stool and abdominal distention.  Genitourinary: Denies dysuria, urgency, frequency, hematuria, flank pain and difficulty urinating.  Endocrine: Denies: hot or cold intolerance, sweats, changes in hair or nails, polyuria, polydipsia. Musculoskeletal: Denies myalgias, back pain, joint swelling, arthralgias and gait problem.  Skin: Denies pallor, rash and wound.  Neurological: Denies seizures, syncope, weakness,  numbness and headaches.  Hematological: Denies adenopathy. Easy bruising, personal or family bleeding history  Psychiatric/Behavioral: Denies  suicidal ideation, mood changes, confusion, nervousness, sleep disturbance and agitation    Physical Exam: Vitals:   03/11/20 1503  BP: 120/70  Pulse: 75  Temp: 98.4 F (36.9 C)  TempSrc: Oral  SpO2: 98%  Weight: 226 lb 6.4 oz (102.7 kg)    Body mass index is 31.58 kg/m.  Constitutional: NAD, calm, comfortable Eyes: PERRL, lids and conjunctivae normal ENMT: Mucous membranes are moist. Posterior pharynx clear of any exudate or lesions. Normal dentition. Tympanic membrane is pearly white, no erythema or bulging. Neck: normal, supple, no masses, no thyromegaly Respiratory: clear to auscultation bilaterally, no wheezing, no crackles. Normal respiratory effort. No accessory muscle use.  Cardiovascular: Regular rate and rhythm, no murmurs / rubs / gallops. No extremity edema. 2+ pedal pulses. No carotid bruits.  Abdomen: no tenderness, no masses palpated. No hepatosplenomegaly. Bowel sounds positive.  Musculoskeletal: no clubbing / cyanosis. No joint deformity upper and lower extremities. Good ROM, no  contractures. Normal muscle tone.  Skin: no rashes, lesions, ulcers. No induration Neurologic: CN 2-12 grossly intact. Sensation intact, DTR normal. Strength 5/5 in all 4.  Psychiatric: Normal judgment and insight. Alert and oriented x 3. Normal mood.    Impression and Plan:  Acute prostatitis -He is being followed by urology.  He has completed his second course of antibiotics.  He no longer has blood in his urine.  He had a very elevated PSA at 18.  Fatigue, unspecified type -Suspect a sequela from his repeated episodes of prostatitis.  Cough  - Plan: benzonatate (TESSALON) 200 MG capsule    Yaa Donnellan Isaac Bliss, MD [facility]

## 2020-03-17 ENCOUNTER — Other Ambulatory Visit: Payer: Self-pay | Admitting: Internal Medicine

## 2020-03-17 DIAGNOSIS — E559 Vitamin D deficiency, unspecified: Secondary | ICD-10-CM

## 2020-03-18 NOTE — Telephone Encounter (Signed)
Informed pt of provider's msg and he voiced understanding, he will have colonoscopy done in 2024

## 2020-03-19 DIAGNOSIS — I5032 Chronic diastolic (congestive) heart failure: Secondary | ICD-10-CM | POA: Diagnosis not present

## 2020-03-19 DIAGNOSIS — S86311D Strain of muscle(s) and tendon(s) of peroneal muscle group at lower leg level, right leg, subsequent encounter: Secondary | ICD-10-CM | POA: Diagnosis not present

## 2020-03-19 DIAGNOSIS — S86311A Strain of muscle(s) and tendon(s) of peroneal muscle group at lower leg level, right leg, initial encounter: Secondary | ICD-10-CM | POA: Diagnosis not present

## 2020-03-19 DIAGNOSIS — J432 Centrilobular emphysema: Secondary | ICD-10-CM | POA: Diagnosis not present

## 2020-03-19 DIAGNOSIS — G4733 Obstructive sleep apnea (adult) (pediatric): Secondary | ICD-10-CM | POA: Diagnosis not present

## 2020-03-21 ENCOUNTER — Ambulatory Visit (HOSPITAL_COMMUNITY)
Admission: RE | Admit: 2020-03-21 | Discharge: 2020-03-21 | Disposition: A | Discharge status: Home / Self Care | Payer: Medicare HMO | Source: Ambulatory Visit | Attending: Neurosurgery | Admitting: Neurosurgery

## 2020-03-21 ENCOUNTER — Other Ambulatory Visit: Payer: Self-pay

## 2020-03-21 DIAGNOSIS — M542 Cervicalgia: Secondary | ICD-10-CM | POA: Diagnosis not present

## 2020-03-21 DIAGNOSIS — M5412 Radiculopathy, cervical region: Secondary | ICD-10-CM | POA: Diagnosis not present

## 2020-03-25 ENCOUNTER — Other Ambulatory Visit: Payer: Self-pay

## 2020-03-25 ENCOUNTER — Encounter: Payer: Self-pay | Admitting: Podiatry

## 2020-03-25 ENCOUNTER — Ambulatory Visit: Payer: Medicare HMO | Admitting: Podiatry

## 2020-03-25 DIAGNOSIS — M79674 Pain in right toe(s): Secondary | ICD-10-CM | POA: Diagnosis not present

## 2020-03-25 DIAGNOSIS — B351 Tinea unguium: Secondary | ICD-10-CM | POA: Diagnosis not present

## 2020-03-25 DIAGNOSIS — Z9689 Presence of other specified functional implants: Secondary | ICD-10-CM | POA: Insufficient documentation

## 2020-03-25 DIAGNOSIS — R251 Tremor, unspecified: Secondary | ICD-10-CM | POA: Insufficient documentation

## 2020-03-25 DIAGNOSIS — M79675 Pain in left toe(s): Secondary | ICD-10-CM

## 2020-03-25 NOTE — Progress Notes (Signed)
This patient returns to the office for evaluation and treatment of long thick painful nails .  This patient is unable to trim his own nails since the patient cannot reach his feet.  Patient says the nails are painful walking and wearing his shoes.  He returns for preventive foot care services. Patient had surgery by Dr.  March Rummage.  General Appearance  Alert, conversant and in no acute stress.  Vascular  Dorsalis pedis and posterior tibial  pulses are palpable  bilaterally.  Capillary return is within normal limits  bilaterally. Temperature is within normal limits  bilaterally.  Neurologic  Senn-Weinstein monofilament wire test within normal limits  bilaterally. Muscle power within normal limits bilaterally.  Nails Thick disfigured discolored nails with subungual debris  from hallux to fifth toes bilaterally.  Hallux nails are especially thickened and split and painful   No evidence of bacterial infection or drainage bilaterally.  Orthopedic  No limitations of motion  feet .  No crepitus or effusions noted.  No bony pathology or digital deformities noted.  Skin  normotropic skin with no porokeratosis noted bilaterally.  No signs of infections or ulcers noted.     Onychomycosis  Pain in toes right foot  Pain in toes left foot  Debridement  of nails  1-5  B/L with a nail nipper.  Nails were then filed using a dremel tool with no incidents.    RTC 3 months    Gardiner Barefoot DPM

## 2020-03-26 ENCOUNTER — Other Ambulatory Visit: Payer: Self-pay | Admitting: Allergy & Immunology

## 2020-03-26 ENCOUNTER — Other Ambulatory Visit: Payer: Self-pay | Admitting: Family Medicine

## 2020-04-18 DIAGNOSIS — M5412 Radiculopathy, cervical region: Secondary | ICD-10-CM | POA: Diagnosis not present

## 2020-04-18 DIAGNOSIS — G250 Essential tremor: Secondary | ICD-10-CM | POA: Diagnosis not present

## 2020-04-18 DIAGNOSIS — Z9689 Presence of other specified functional implants: Secondary | ICD-10-CM | POA: Diagnosis not present

## 2020-04-18 DIAGNOSIS — M47812 Spondylosis without myelopathy or radiculopathy, cervical region: Secondary | ICD-10-CM | POA: Diagnosis not present

## 2020-04-18 DIAGNOSIS — M542 Cervicalgia: Secondary | ICD-10-CM | POA: Diagnosis not present

## 2020-04-19 ENCOUNTER — Other Ambulatory Visit: Payer: Self-pay | Admitting: Family Medicine

## 2020-04-19 DIAGNOSIS — I5032 Chronic diastolic (congestive) heart failure: Secondary | ICD-10-CM | POA: Diagnosis not present

## 2020-04-19 DIAGNOSIS — S86311D Strain of muscle(s) and tendon(s) of peroneal muscle group at lower leg level, right leg, subsequent encounter: Secondary | ICD-10-CM | POA: Diagnosis not present

## 2020-04-19 DIAGNOSIS — G4733 Obstructive sleep apnea (adult) (pediatric): Secondary | ICD-10-CM | POA: Diagnosis not present

## 2020-04-19 DIAGNOSIS — S86311A Strain of muscle(s) and tendon(s) of peroneal muscle group at lower leg level, right leg, initial encounter: Secondary | ICD-10-CM | POA: Diagnosis not present

## 2020-04-19 DIAGNOSIS — J432 Centrilobular emphysema: Secondary | ICD-10-CM | POA: Diagnosis not present

## 2020-04-21 DIAGNOSIS — G8918 Other acute postprocedural pain: Secondary | ICD-10-CM | POA: Diagnosis not present

## 2020-04-21 DIAGNOSIS — M65322 Trigger finger, left index finger: Secondary | ICD-10-CM | POA: Diagnosis not present

## 2020-04-21 DIAGNOSIS — M1812 Unilateral primary osteoarthritis of first carpometacarpal joint, left hand: Secondary | ICD-10-CM | POA: Diagnosis not present

## 2020-04-28 DIAGNOSIS — R972 Elevated prostate specific antigen [PSA]: Secondary | ICD-10-CM | POA: Diagnosis not present

## 2020-04-28 LAB — PSA: PSA: 16.3

## 2020-05-03 DIAGNOSIS — M65331 Trigger finger, right middle finger: Secondary | ICD-10-CM | POA: Diagnosis not present

## 2020-05-03 DIAGNOSIS — M65341 Trigger finger, right ring finger: Secondary | ICD-10-CM | POA: Diagnosis not present

## 2020-05-03 DIAGNOSIS — M25642 Stiffness of left hand, not elsewhere classified: Secondary | ICD-10-CM | POA: Diagnosis not present

## 2020-05-03 DIAGNOSIS — M1852 Other unilateral secondary osteoarthritis of first carpometacarpal joint, left hand: Secondary | ICD-10-CM | POA: Diagnosis not present

## 2020-05-04 DIAGNOSIS — R69 Illness, unspecified: Secondary | ICD-10-CM | POA: Diagnosis not present

## 2020-05-05 ENCOUNTER — Other Ambulatory Visit: Payer: Self-pay | Admitting: Allergy & Immunology

## 2020-05-05 DIAGNOSIS — R350 Frequency of micturition: Secondary | ICD-10-CM | POA: Diagnosis not present

## 2020-05-05 DIAGNOSIS — N401 Enlarged prostate with lower urinary tract symptoms: Secondary | ICD-10-CM | POA: Diagnosis not present

## 2020-05-05 DIAGNOSIS — R972 Elevated prostate specific antigen [PSA]: Secondary | ICD-10-CM | POA: Diagnosis not present

## 2020-05-06 ENCOUNTER — Other Ambulatory Visit: Payer: Self-pay | Admitting: Urology

## 2020-05-06 DIAGNOSIS — R972 Elevated prostate specific antigen [PSA]: Secondary | ICD-10-CM

## 2020-05-10 ENCOUNTER — Other Ambulatory Visit (HOSPITAL_COMMUNITY): Payer: Self-pay | Admitting: Urology

## 2020-05-10 DIAGNOSIS — R972 Elevated prostate specific antigen [PSA]: Secondary | ICD-10-CM

## 2020-05-11 ENCOUNTER — Ambulatory Visit (INDEPENDENT_AMBULATORY_CARE_PROVIDER_SITE_OTHER)
Admission: RE | Admit: 2020-05-11 | Discharge: 2020-05-11 | Disposition: A | Discharge status: Home / Self Care | Payer: Medicare HMO | Source: Ambulatory Visit | Attending: Pulmonary Disease | Admitting: Pulmonary Disease

## 2020-05-11 ENCOUNTER — Other Ambulatory Visit: Payer: Self-pay

## 2020-05-11 DIAGNOSIS — J984 Other disorders of lung: Secondary | ICD-10-CM | POA: Diagnosis not present

## 2020-05-11 DIAGNOSIS — J439 Emphysema, unspecified: Secondary | ICD-10-CM | POA: Diagnosis not present

## 2020-05-11 DIAGNOSIS — R911 Solitary pulmonary nodule: Secondary | ICD-10-CM | POA: Diagnosis not present

## 2020-05-11 DIAGNOSIS — R918 Other nonspecific abnormal finding of lung field: Secondary | ICD-10-CM | POA: Diagnosis not present

## 2020-05-17 DIAGNOSIS — M25642 Stiffness of left hand, not elsewhere classified: Secondary | ICD-10-CM | POA: Diagnosis not present

## 2020-05-19 ENCOUNTER — Other Ambulatory Visit: Payer: Self-pay | Admitting: Family Medicine

## 2020-05-19 DIAGNOSIS — S86311A Strain of muscle(s) and tendon(s) of peroneal muscle group at lower leg level, right leg, initial encounter: Secondary | ICD-10-CM | POA: Diagnosis not present

## 2020-05-19 DIAGNOSIS — S86311D Strain of muscle(s) and tendon(s) of peroneal muscle group at lower leg level, right leg, subsequent encounter: Secondary | ICD-10-CM | POA: Diagnosis not present

## 2020-05-19 DIAGNOSIS — J432 Centrilobular emphysema: Secondary | ICD-10-CM | POA: Diagnosis not present

## 2020-05-19 DIAGNOSIS — I5032 Chronic diastolic (congestive) heart failure: Secondary | ICD-10-CM | POA: Diagnosis not present

## 2020-05-19 DIAGNOSIS — G4733 Obstructive sleep apnea (adult) (pediatric): Secondary | ICD-10-CM | POA: Diagnosis not present

## 2020-05-23 ENCOUNTER — Other Ambulatory Visit: Payer: Self-pay

## 2020-05-23 ENCOUNTER — Ambulatory Visit (HOSPITAL_COMMUNITY)
Admission: RE | Admit: 2020-05-23 | Discharge: 2020-05-23 | Disposition: A | Discharge status: Home / Self Care | Payer: Medicare HMO | Source: Ambulatory Visit | Attending: Urology | Admitting: Urology

## 2020-05-23 DIAGNOSIS — R972 Elevated prostate specific antigen [PSA]: Secondary | ICD-10-CM

## 2020-05-26 ENCOUNTER — Ambulatory Visit (HOSPITAL_COMMUNITY): Admission: RE | Admit: 2020-05-26 | Payer: Medicare HMO | Source: Ambulatory Visit

## 2020-05-26 ENCOUNTER — Encounter (HOSPITAL_COMMUNITY): Payer: Self-pay

## 2020-05-27 DIAGNOSIS — R972 Elevated prostate specific antigen [PSA]: Secondary | ICD-10-CM | POA: Diagnosis not present

## 2020-05-31 DIAGNOSIS — Z4789 Encounter for other orthopedic aftercare: Secondary | ICD-10-CM | POA: Diagnosis not present

## 2020-05-31 DIAGNOSIS — M65341 Trigger finger, right ring finger: Secondary | ICD-10-CM | POA: Diagnosis not present

## 2020-05-31 DIAGNOSIS — M25532 Pain in left wrist: Secondary | ICD-10-CM | POA: Diagnosis not present

## 2020-05-31 DIAGNOSIS — M65321 Trigger finger, right index finger: Secondary | ICD-10-CM | POA: Diagnosis not present

## 2020-05-31 DIAGNOSIS — M65331 Trigger finger, right middle finger: Secondary | ICD-10-CM | POA: Diagnosis not present

## 2020-05-31 DIAGNOSIS — M1852 Other unilateral secondary osteoarthritis of first carpometacarpal joint, left hand: Secondary | ICD-10-CM | POA: Diagnosis not present

## 2020-05-31 DIAGNOSIS — M25642 Stiffness of left hand, not elsewhere classified: Secondary | ICD-10-CM | POA: Diagnosis not present

## 2020-05-31 DIAGNOSIS — G5601 Carpal tunnel syndrome, right upper limb: Secondary | ICD-10-CM | POA: Diagnosis not present

## 2020-06-01 DIAGNOSIS — L821 Other seborrheic keratosis: Secondary | ICD-10-CM | POA: Diagnosis not present

## 2020-06-01 DIAGNOSIS — D1801 Hemangioma of skin and subcutaneous tissue: Secondary | ICD-10-CM | POA: Diagnosis not present

## 2020-06-01 DIAGNOSIS — L819 Disorder of pigmentation, unspecified: Secondary | ICD-10-CM | POA: Diagnosis not present

## 2020-06-01 DIAGNOSIS — B354 Tinea corporis: Secondary | ICD-10-CM | POA: Diagnosis not present

## 2020-06-01 DIAGNOSIS — L708 Other acne: Secondary | ICD-10-CM | POA: Diagnosis not present

## 2020-06-01 DIAGNOSIS — L814 Other melanin hyperpigmentation: Secondary | ICD-10-CM | POA: Diagnosis not present

## 2020-06-01 DIAGNOSIS — I8393 Asymptomatic varicose veins of bilateral lower extremities: Secondary | ICD-10-CM | POA: Diagnosis not present

## 2020-06-01 DIAGNOSIS — L570 Actinic keratosis: Secondary | ICD-10-CM | POA: Diagnosis not present

## 2020-06-03 ENCOUNTER — Other Ambulatory Visit: Payer: Self-pay | Admitting: Internal Medicine

## 2020-06-03 DIAGNOSIS — E559 Vitamin D deficiency, unspecified: Secondary | ICD-10-CM

## 2020-06-03 DIAGNOSIS — N5201 Erectile dysfunction due to arterial insufficiency: Secondary | ICD-10-CM | POA: Diagnosis not present

## 2020-06-03 DIAGNOSIS — R3911 Hesitancy of micturition: Secondary | ICD-10-CM | POA: Diagnosis not present

## 2020-06-03 DIAGNOSIS — R972 Elevated prostate specific antigen [PSA]: Secondary | ICD-10-CM | POA: Diagnosis not present

## 2020-06-03 DIAGNOSIS — N401 Enlarged prostate with lower urinary tract symptoms: Secondary | ICD-10-CM | POA: Diagnosis not present

## 2020-06-08 ENCOUNTER — Other Ambulatory Visit: Payer: Self-pay | Admitting: Urology

## 2020-06-08 ENCOUNTER — Ambulatory Visit: Payer: Medicare HMO | Admitting: Pulmonary Disease

## 2020-06-08 ENCOUNTER — Encounter: Payer: Self-pay | Admitting: Internal Medicine

## 2020-06-08 ENCOUNTER — Other Ambulatory Visit: Payer: Self-pay

## 2020-06-08 ENCOUNTER — Encounter: Payer: Self-pay | Admitting: Pulmonary Disease

## 2020-06-08 VITALS — BP 126/78 | HR 74 | Temp 97.8°F | Ht 70.0 in | Wt 232.2 lb

## 2020-06-08 DIAGNOSIS — J432 Centrilobular emphysema: Secondary | ICD-10-CM

## 2020-06-08 DIAGNOSIS — J449 Chronic obstructive pulmonary disease, unspecified: Secondary | ICD-10-CM

## 2020-06-08 DIAGNOSIS — R918 Other nonspecific abnormal finding of lung field: Secondary | ICD-10-CM

## 2020-06-08 DIAGNOSIS — J9611 Chronic respiratory failure with hypoxia: Secondary | ICD-10-CM | POA: Diagnosis not present

## 2020-06-08 DIAGNOSIS — J418 Mixed simple and mucopurulent chronic bronchitis: Secondary | ICD-10-CM

## 2020-06-08 MED ORDER — TRELEGY ELLIPTA 200-62.5-25 MCG/INH IN AEPB: 160.0000 ug | INHALATION_SPRAY | Freq: Every day | RESPIRATORY_TRACT | 0 refills | Status: DC

## 2020-06-08 MED ORDER — TRELEGY ELLIPTA 200-62.5-25 MCG/INH IN AEPB: 1.0000 | INHALATION_SPRAY | Freq: Every day | RESPIRATORY_TRACT | 3 refills | Status: DC

## 2020-06-08 NOTE — Progress Notes (Signed)
[facility]  Chief Complaint  Patient presents with  . Follow-up    COPD    Constitutional:  BP 126/78 (BP Location: Left Arm, Patient Position: Sitting, Cuff Size: Normal)   Pulse 74   Temp 97.8 F (36.6 C) (Temporal)   Ht 5\' 10"  (1.778 m)   Wt 232 lb 3.2 oz (105.3 kg)   SpO2 98%   BMI 33.32 kg/m   Past Medical History:  Carpal tunnel, GERD, Tremor, ED, Cataracts, Obstructive sleep apnea intolerant of CPAP/Bipap  Past Surgical History:  His  has a past surgical history that includes Hernia repair (Right, 2007); Cataract extraction (Bilateral, 2020); Vein Surgery (Bilateral, 2009); Rotator cuff repair (Right, 2014); Appendectomy (2015); Total shoulder replacement (Left, 2016); Deep brain stimulator placement (Left, 2016); Carpal tunnel release (Left, 2019); Tonsillectomy; Adenoidectomy; Ankle surgery (Right); Colonoscopy; and Subthalamic stimulator battery replacement (N/A, 01/21/2020).  Brief Summary:  Mcadoo Muzquiz is a 70 y.o. male former smoker with asthma, emphysema, and sleep related hypoxia.      Subjective:   His weight is down about 35 lbs (his heaviest weight was around 260 lbs).  He has tried sleeping without oxygen at night, and hasn't noticed a difference.  His breathing was better when he was on higher dose of trelegy.  He ran out of sample and switched back to 100 strength of trelegy.  CT chest from April showed stable tiny nodules.  He had prostate biopsy earlier this month.  Was told he doesn't have cancer, but he will likely need TURP.  He is planning a trip to [place], [place] and [place] later this Summer.  Physical Exam:   Appearance - well kempt   ENMT - no sinus tenderness, no oral exudate, no LAN, Mallampati 2 airway, no stridor  Respiratory - equal breath sounds bilaterally, no wheezing or rales  CV - s1s2 regular rate and rhythm, no murmurs  Ext - no clubbing, no edema  Skin - no  rashes  Psych - normal mood and affect    Pulmonary testing:   PFT 12/21/16 >> FEV1 2.40 (69%), FEV1% 74, TLC 4.95 (73%), DLCO 77%, +BD  IgE 04/10/19 >> 66  PFT 04/20/19 >> FEV1 2.09 (62%), FEV1% 74, TLC 5.33 (75%), DLCO 93%  Chest Imaging:   CT chest 05/04/19 >> centrilobular and paraseptal emphysema, 3 mm nodule RUL, 4 mm nodule RML, 3 mm nodule lingula  CT chest 05/11/20 >> stable nodules up to 3 mm  Sleep Tests:   HST 04/20/15 >> AHI 40  Auto Bipap 05/21/19 to 06/01/19 >> used on 11 of 12 nights with average 6 hrs 50 min.  Average AHI 9.1 with median Bipap 11/7 and 95 th percentile Bipap 12/8 cm H2O.  ONO with RA 11/17/19 >> test time 2 hrs 42 min.  Baseline SpO2 88%, low SpO2 82%.  Spent 1 hr 27 min with SpO2 < 88%.  Cardiac Tests:   Echo 05/29/19 >> EF 60 to 65%, grade 1 DD, mod LA dilation  Social History:  He  reports that he quit smoking about 31 years ago. His smoking use included cigarettes. He has a 60.00 pack-year smoking history. He has quit using smokeless tobacco. He reports previous alcohol use of about 14.0 standard drinks of alcohol per week. He reports that he does not use drugs.  Family History:  His family history includes Asthma in his father; COPD in his mother; Healthy in his child; Rectal cancer in his sister.  Assessment/Plan:   COPD with emphysema and asthma. - breztri and symbicort were ineffective and too expensive - he felt better with trelegy 200 strength; samples given and send 90 day supply - continue singulair - prn albuterol  Chronic respiratory failure with hypoxia. - from sleep related hypoventilation and emphysema - using 2 liters oxygen at night - gets supplies from [employer] - he has lost significant amount of weight and doesn't feel like he needs supplemental oxygen anymore - will arrange for overnight oximetry on room air  Lung nodules. - these are tiny and have been stable over serial CT imaging - likely benign - no  additional radiographic follow up needed  Perennial allergic rhinitis with nonallergic component. - followed by Dr. Salvatore Marvel with [facility]  Tremor. - followed by Dr. Wells Guiles Tat with [facility]  Benign prostatic hypertrophy. - he is followed at Dr. Leonia Reader [facility] - no pulmonary contraindications for him to have TURP if needed  Time Spent Involved in Patient Care on Day of Examination:  31 minutes  Follow up:  Patient Instructions  Will arrange for overnight oxygen test on room air  Follow up in 6 months   Medication List:   Allergies as of 06/08/2020   No Known Allergies     Medication List       Accurate as of Jun 08, 2020 10:07 AM. If you have any questions, ask your nurse or doctor.        STOP taking these medications   Trelegy Ellipta 100-62.5-25 MCG/INH Aepb Generic drug: Fluticasone-Umeclidin-Vilant Replaced by: Donnal Debar 200-62.5-25 MCG/INH Aepb You also have another medication with the same name that you need to continue taking as instructed. Stopped by: Chesley Mires, MD     TAKE these medications   albuterol (2.5 MG/3ML) 0.083% nebulizer solution Commonly known as: PROVENTIL Take 3 mLs (2.5 mg total) by nebulization every 6 (six) hours.   albuterol 108 (90 Base) MCG/ACT inhaler Commonly known as: VENTOLIN HFA INHALE 2 PUFFS INTO THE LUNGS EVERY 4 HOURS AS NEEDED FOR WHEEZE OR FOR SHORTNESS OF BREATH   azelastine 0.1 % nasal spray Commonly known as: ASTELIN Place 1-2 sprays into both nostrils 2 (two) times daily as needed for rhinitis.   benzonatate 200 MG capsule Commonly known as: TESSALON Take 1 capsule (200 mg total) by mouth 2 (two) times daily as needed for cough.   escitalopram 10 MG tablet Commonly known as: LEXAPRO Take 10 mg by mouth daily.   fluticasone 50 MCG/ACT nasal spray Commonly known as: Flonase Place 2 sprays into both nostrils daily as needed for allergies or rhinitis.    folic acid 314 MCG tablet Commonly known as: FOLVITE Take 400 mcg by mouth daily.   montelukast 10 MG tablet Commonly known as: SINGULAIR Take 1 tablet (10 mg total) by mouth at bedtime.   omeprazole 20 MG capsule Commonly known as: PRILOSEC TAKE 1 CAPSULE BY MOUTH EVERY DAY   tadalafil 20 MG tablet Commonly known as: CIALIS Take 1 tablet (20 mg total) by mouth daily as needed for erectile dysfunction. What changed:   how much to take  when to take this   tamsulosin 0.4 MG Caps capsule Commonly known as: FLOMAX Take 0.4 mg by mouth daily.   tiZANidine 2 MG tablet Commonly known as: ZANAFLEX TAKE 1 TABLET BY MOUTH EVERYDAY AT BEDTIME   traZODone 100 MG tablet Commonly known as: DESYREL Take 100 mg by mouth at bedtime.   Trelegy  Ellipta 200-62.5-25 MCG/INH Aepb Generic drug: Fluticasone-Umeclidin-Vilant Inhale 1 puff into the lungs daily. What changed:   Another medication with the same name was added. Make sure you understand how and when to take each.  Another medication with the same name was removed. Continue taking this medication, and follow the directions you see here. Changed by: Chesley Mires, MD   Trelegy Ellipta 200-62.5-25 MCG/INH Aepb Generic drug: Fluticasone-Umeclidin-Vilant Inhale 160 mcg into the lungs daily. What changed: You were already taking a medication with the same name, and this prescription was added. Make sure you understand how and when to take each. Replaces: Trelegy Ellipta 100-62.5-25 MCG/INH Aepb Changed by: Chesley Mires, MD   vitamin C 1000 MG tablet Take 1,000 mg by mouth daily.       Signature:  Chesley Mires, MD [place] Pager - [phone] 06/08/2020, 10:07 AM

## 2020-06-08 NOTE — Patient Instructions (Signed)
Will arrange for overnight oxygen test on room air  Follow up in 6 months

## 2020-06-15 ENCOUNTER — Encounter: Payer: Self-pay | Admitting: Internal Medicine

## 2020-06-15 ENCOUNTER — Ambulatory Visit (INDEPENDENT_AMBULATORY_CARE_PROVIDER_SITE_OTHER): Payer: Medicare HMO | Admitting: Internal Medicine

## 2020-06-15 ENCOUNTER — Other Ambulatory Visit: Payer: Self-pay

## 2020-06-15 VITALS — BP 120/80 | HR 71 | Temp 98.2°F | Ht 70.0 in | Wt 236.1 lb

## 2020-06-15 DIAGNOSIS — R251 Tremor, unspecified: Secondary | ICD-10-CM | POA: Diagnosis not present

## 2020-06-15 DIAGNOSIS — Z9689 Presence of other specified functional implants: Secondary | ICD-10-CM | POA: Diagnosis not present

## 2020-06-15 DIAGNOSIS — M5412 Radiculopathy, cervical region: Secondary | ICD-10-CM | POA: Diagnosis not present

## 2020-06-15 DIAGNOSIS — R972 Elevated prostate specific antigen [PSA]: Secondary | ICD-10-CM | POA: Diagnosis not present

## 2020-06-15 DIAGNOSIS — R030 Elevated blood-pressure reading, without diagnosis of hypertension: Secondary | ICD-10-CM | POA: Diagnosis not present

## 2020-06-15 DIAGNOSIS — J418 Mixed simple and mucopurulent chronic bronchitis: Secondary | ICD-10-CM | POA: Diagnosis not present

## 2020-06-15 DIAGNOSIS — G4733 Obstructive sleep apnea (adult) (pediatric): Secondary | ICD-10-CM | POA: Diagnosis not present

## 2020-06-15 DIAGNOSIS — R69 Illness, unspecified: Secondary | ICD-10-CM | POA: Diagnosis not present

## 2020-06-15 DIAGNOSIS — E559 Vitamin D deficiency, unspecified: Secondary | ICD-10-CM | POA: Diagnosis not present

## 2020-06-15 DIAGNOSIS — Z0000 Encounter for general adult medical examination without abnormal findings: Secondary | ICD-10-CM | POA: Diagnosis not present

## 2020-06-15 DIAGNOSIS — F339 Major depressive disorder, recurrent, unspecified: Secondary | ICD-10-CM

## 2020-06-15 LAB — HEMOGLOBIN A1C: Hgb A1c MFr Bld: 5.9 % (ref 4.6–6.5)

## 2020-06-15 LAB — CBC WITH DIFFERENTIAL/PLATELET
Basophils Absolute: 0.1 10*3/uL (ref 0.0–0.1)
Basophils Relative: 1 % (ref 0.0–3.0)
Eosinophils Absolute: 0.1 10*3/uL (ref 0.0–0.7)
Eosinophils Relative: 1.6 % (ref 0.0–5.0)
HCT: 40.1 % (ref 39.0–52.0)
Hemoglobin: 13.3 g/dL (ref 13.0–17.0)
Lymphocytes Relative: 17.1 % (ref 12.0–46.0)
Lymphs Abs: 1.4 10*3/uL (ref 0.7–4.0)
MCHC: 33.1 g/dL (ref 30.0–36.0)
MCV: 95 fl (ref 78.0–100.0)
Monocytes Absolute: 0.6 10*3/uL (ref 0.1–1.0)
Monocytes Relative: 7.3 % (ref 3.0–12.0)
Neutro Abs: 5.9 10*3/uL (ref 1.4–7.7)
Neutrophils Relative %: 73 % (ref 43.0–77.0)
Platelets: 295 10*3/uL (ref 150.0–400.0)
RBC: 4.22 Mil/uL (ref 4.22–5.81)
RDW: 14 % (ref 11.5–15.5)
WBC: 8.1 10*3/uL (ref 4.0–10.5)

## 2020-06-15 LAB — VITAMIN B12: Vitamin B-12: 257 pg/mL (ref 211–911)

## 2020-06-15 LAB — TSH: TSH: 1.69 u[IU]/mL (ref 0.35–4.50)

## 2020-06-15 LAB — VITAMIN D 25 HYDROXY (VIT D DEFICIENCY, FRACTURES): VITD: 33.85 ng/mL (ref 30.00–100.00)

## 2020-06-15 MED ORDER — ALBUTEROL SULFATE HFA 108 (90 BASE) MCG/ACT IN AERS: INHALATION_SPRAY | RESPIRATORY_TRACT | 0 refills | Status: DC

## 2020-06-15 MED ORDER — TIZANIDINE HCL 2 MG PO TABS: ORAL_TABLET | ORAL | 1 refills | Status: DC

## 2020-06-15 MED ORDER — MONTELUKAST SODIUM 10 MG PO TABS: 10.0000 mg | ORAL_TABLET | Freq: Every day | ORAL | 1 refills | Status: DC

## 2020-06-15 MED ORDER — TRAZODONE HCL 100 MG PO TABS: 100.0000 mg | ORAL_TABLET | Freq: Every day | ORAL | 1 refills | Status: DC

## 2020-06-15 NOTE — Patient Instructions (Signed)
-Nice seeing you today!!  -Lab work today; will notify you once results are available.  -Schedule follow up in 6 months.   Preventive Care 70 Years and Older, Male Preventive care refers to lifestyle choices and visits with your health care provider that can promote health and wellness. This includes:  A yearly physical exam. This is also called an annual wellness visit.  Regular dental and eye exams.  Immunizations.  Screening for certain conditions.  Healthy lifestyle choices, such as: ? Eating a healthy diet. ? Getting regular exercise. ? Not using drugs or products that contain nicotine and tobacco. ? Limiting alcohol use. What can I expect for my preventive care visit? Physical exam Your health care provider will check your:  Height and weight. These may be used to calculate your BMI (body mass index). BMI is a measurement that tells if you are at a healthy weight.  Heart rate and blood pressure.  Body temperature.  Skin for abnormal spots. Counseling Your health care provider may ask you questions about your:  Past medical problems.  Family's medical history.  Alcohol, tobacco, and drug use.  Emotional well-being.  Home life and relationship well-being.  Sexual activity.  Diet, exercise, and sleep habits.  History of falls.  Memory and ability to understand (cognition).  Work and work Statistician.  Access to firearms. What immunizations do I need? Vaccines are usually given at various ages, according to a schedule. Your health care provider will recommend vaccines for you based on your age, medical history, and lifestyle or other factors, such as travel or where you work.   What tests do I need? Blood tests  Lipid and cholesterol levels. These may be checked every 5 years, or more often depending on your overall health.  Hepatitis C test.  Hepatitis B test. Screening  Lung cancer screening. You may have this screening every year starting at  age 70 if you have a 30-pack-year history of smoking and currently smoke or have quit within the past 15 years.  Colorectal cancer screening. ? All adults should have this screening starting at age 70 and continuing until age 70. ? Your health care provider may recommend screening at age 69 if you are at increased risk. ? You will have tests every 1-10 years, depending on your results and the type of screening test.  Prostate cancer screening. Recommendations will vary depending on your family history and other risks.  Genital exam to check for testicular cancer or hernias.  Diabetes screening. ? This is done by checking your blood sugar (glucose) after you have not eaten for a while (fasting). ? You may have this done every 1-3 years.  Abdominal aortic aneurysm (AAA) screening. You may need this if you are a current or former smoker.  STD (sexually transmitted disease) testing, if you are at risk. Follow these instructions at home: Eating and drinking  Eat a diet that includes fresh fruits and vegetables, whole grains, lean protein, and low-fat dairy products. Limit your intake of foods with high amounts of sugar, saturated fats, and salt.  Take vitamin and mineral supplements as recommended by your health care provider.  Do not drink alcohol if your health care provider tells you not to drink.  If you drink alcohol: ? Limit how much you have to 0-2 drinks a day. ? Be aware of how much alcohol is in your drink. In the [place] one drink equals one 12 oz bottle of beer (355 mL), one 5 oz glass  of wine (148 mL), or one 1 oz glass of hard liquor (44 mL).   Lifestyle  Take daily care of your teeth and gums. Brush your teeth every morning and night with fluoride toothpaste. Floss one time each day.  Stay active. Exercise for at least 30 minutes 5 or more days each week.  Do not use any products that contain nicotine or tobacco, such as cigarettes, e-cigarettes, and chewing tobacco. If  you need help quitting, ask your health care provider.  Do not use drugs.  If you are sexually active, practice safe sex. Use a condom or other form of protection to prevent STIs (sexually transmitted infections).  Talk with your health care provider about taking a low-dose aspirin or statin.  Find healthy ways to cope with stress, such as: ? Meditation, yoga, or listening to music. ? Journaling. ? Talking to a trusted person. ? Spending time with friends and family. Safety  Always wear your seat belt while driving or riding in a vehicle.  Do not drive: ? If you have been drinking alcohol. Do not ride with someone who has been drinking. ? When you are tired or distracted. ? While texting.  Wear a helmet and other protective equipment during sports activities.  If you have firearms in your house, make sure you follow all gun safety procedures. What's next?  Visit your health care provider once a year for an annual wellness visit.  Ask your health care provider how often you should have your eyes and teeth checked.  Stay up to date on all vaccines. This information is not intended to replace advice given to you by your health care provider. Make sure you discuss any questions you have with your health care provider. Document Revised: 09/30/2018 Document Reviewed: 12/26/2017 Elsevier Patient Education  2021 [employer].

## 2020-06-15 NOTE — Progress Notes (Signed)
Established Patient Office Visit     This visit occurred during the SARS-CoV-2 public health emergency.  Safety protocols were in place, including screening questions prior to the visit, additional usage of staff PPE, and extensive cleaning of exam room while observing appropriate contact time as indicated for disinfecting solutions.    CC/Reason for Visit: Annual preventive exam and subsequent Medicare wellness visit  HPI: Allen [name] is a 70 y.o. male who is coming in today for the above mentioned reasons. Past Medical History is significant for:   1. Essential tremor with a brain stimulator followed by Dr. Carles Collet.  2. COPD/asthma, followed by pulmonary  3. Obstructive sleep apnea on BiPAP, used to see a sleep physician who has him on trazodone and sublingual zolpidem.  4. Right carpal tunnel syndrome has been seeing a local orthopedist and recently had several steroid injections in his hand which seems to help.  5. Bilateral cataracts  6. GERD causing vocal cord dysfunction. He has been taking famotidine in the morning and omeprazole in the evening.  7.  He has had recurrent episodes of prostatitis and is now scheduled for TURP and is requesting clearance today.  8.  He has been having a lot of depression.  He saw a psychiatrist who started him on Lexapro and Ambien.  8.  He has cervical radiculopathy and disc disease.  He is scheduled for an epidural injection next month.  All immunizations are up-to-date.  He has routine eye and dental care.  No perceived hearing issues.  Has not been exercising much.  He had a colonoscopy in 2019 and is a 5-year callback.   Past Medical/Surgical History: Past Medical History:  Diagnosis Date  . Anxiety   . Arthritis    neck - no meds  . Asthma   . Bilateral cataracts    removed by surgery  . COPD (chronic obstructive pulmonary disease) ([place])   . Dyspnea    occasional with exertion  . ED (erectile dysfunction)    . Essential tremor    with nerve stimulator  . GERD (gastroesophageal reflux disease)   . Insomnia   . OSA treated with BiPAP    does not use bipap - uses 2 L oxygen via [place] while sleeping  . Recurrent upper respiratory infection (URI)   . Right carpal tunnel syndrome     Past Surgical History:  Procedure Laterality Date  . ADENOIDECTOMY    . ANKLE SURGERY Right   . APPENDECTOMY  2015  . CARPAL TUNNEL RELEASE Left 2019  . CATARACT EXTRACTION Bilateral 2020  . COLONOSCOPY    . DEEP BRAIN STIMULATOR PLACEMENT Left 2016  . HERNIA REPAIR Right 2007  . ROTATOR CUFF REPAIR Right 2014  . SUBTHALAMIC STIMULATOR BATTERY REPLACEMENT N/A 01/21/2020   Procedure: Deep brain stimulator battery replacement;  Surgeon: Erline Levine, MD;  Location: [facility];  Service: Neurosurgery;  Laterality: N/A;  . TONSILLECTOMY    . TOTAL SHOULDER REPLACEMENT Left 2016  . VEIN SURGERY Bilateral 2009   varicose veins- bilateral    Social History:  reports that he quit smoking about 31 years ago. His smoking use included cigarettes. He has a 60.00 pack-year smoking history. He has quit using smokeless tobacco. He reports previous alcohol use of about 14.0 standard drinks of alcohol per week. He reports that he does not use drugs.  Allergies: No Known Allergies  Family History:  Family History  Problem Relation Age of Onset  . COPD Mother   .  Asthma Father   . Rectal cancer Sister   . Healthy Child   . Urticaria Neg Hx   . Immunodeficiency Neg Hx   . Eczema Neg Hx   . Atopy Neg Hx   . Angioedema Neg Hx   . Allergic rhinitis Neg Hx      Current Outpatient Medications:  .  albuterol (PROVENTIL) (2.5 MG/3ML) 0.083% nebulizer solution, Take 3 mLs (2.5 mg total) by nebulization every 6 (six) hours., Disp: 150 mL, Rfl: 3 .  Ascorbic Acid (VITAMIN C) 1000 MG tablet, Take 1,000 mg by mouth daily., Disp: , Rfl:  .  azelastine (ASTELIN) 0.1 % nasal spray, Place 1-2 sprays into both nostrils 2 (two) times  daily as needed for rhinitis., Disp: 30 mL, Rfl: 5 .  escitalopram (LEXAPRO) 20 MG tablet, Take 20 mg by mouth daily., Disp: , Rfl:  .  fluticasone (FLONASE) 50 MCG/ACT nasal spray, Place 2 sprays into both nostrils daily as needed for allergies or rhinitis., Disp: 16 g, Rfl: 5 .  Fluticasone-Umeclidin-Vilant (TRELEGY ELLIPTA) 200-62.5-25 MCG/INH AEPB, Inhale 1 puff into the lungs daily., Disp: 3 each, Rfl: 3 .  folic acid (FOLVITE) 761 MCG tablet, Take 400 mcg by mouth daily., Disp: , Rfl:  .  omeprazole (PRILOSEC) 20 MG capsule, TAKE 1 CAPSULE BY MOUTH EVERY DAY (Patient taking differently: daily as needed.), Disp: 90 capsule, Rfl: 0 .  silodosin (RAPAFLO) 8 MG CAPS capsule, Take 8 mg by mouth daily., Disp: , Rfl:  .  tadalafil (CIALIS) 20 MG tablet, Take 1 tablet (20 mg total) by mouth daily as needed for erectile dysfunction. (Patient taking differently: Take 10 mg by mouth daily.), Disp: 90 tablet, Rfl: 3 .  zolpidem (AMBIEN) 10 MG tablet, Take 10 mg by mouth at bedtime as needed for sleep., Disp: , Rfl:  .  albuterol (VENTOLIN HFA) 108 (90 Base) MCG/ACT inhaler, INHALE 2 PUFFS INTO THE LUNGS EVERY 4 HOURS AS NEEDED FOR WHEEZE OR FOR SHORTNESS OF BREATH, Disp: 8.5 each, Rfl: 0 .  montelukast (SINGULAIR) 10 MG tablet, Take 1 tablet (10 mg total) by mouth at bedtime., Disp: 90 tablet, Rfl: 1 .  tiZANidine (ZANAFLEX) 2 MG tablet, TAKE 1 TABLET BY MOUTH EVERYDAY AT BEDTIME, Disp: 90 tablet, Rfl: 1 .  traZODone (DESYREL) 100 MG tablet, Take 1 tablet (100 mg total) by mouth at bedtime., Disp: 90 tablet, Rfl: 1  Review of Systems:  Constitutional: Denies fever, chills, diaphoresis, appetite change and fatigue.  HEENT: Denies photophobia, eye pain, redness, hearing loss, ear pain, congestion, sore throat, rhinorrhea, sneezing, mouth sores, trouble swallowing, neck pain, neck stiffness and tinnitus.   Respiratory: Denies SOB, DOE, cough, chest tightness,  and wheezing.   Cardiovascular: Denies chest  pain, palpitations and leg swelling.  Gastrointestinal: Denies nausea, vomiting, abdominal pain, diarrhea, constipation, blood in stool and abdominal distention.  Genitourinary: Denies dysuria, urgency, frequency, hematuria, flank pain and difficulty urinating.  Endocrine: Denies: hot or cold intolerance, sweats, changes in hair or nails, polyuria, polydipsia. Musculoskeletal: Denies myalgias, back pain, joint swelling, arthralgias and gait problem.  Skin: Denies pallor, rash and wound.  Neurological: Denies dizziness, seizures, syncope, weakness, light-headedness, numbness and headaches.  Hematological: Denies adenopathy. Easy bruising, personal or family bleeding history  Psychiatric/Behavioral: Denies suicidal ideation, mood changes, confusion, nervousness, sleep disturbance and agitation    Physical Exam: Vitals:   06/15/20 1306  BP: 120/80  Pulse: 71  Temp: 98.2 F (36.8 C)  TempSrc: Oral  SpO2: 97%  Weight: 236 lb  1.6 oz (107.1 kg)  Height: 5\' 10"  (1.778 m)    Body mass index is 33.88 kg/m.   Constitutional: NAD, calm, comfortable Eyes: PERRL, lids and conjunctivae normal ENMT: Mucous membranes are moist. Posterior pharynx clear of any exudate or lesions. Normal dentition. Tympanic membrane is pearly white, no erythema or bulging. Neck: normal, supple, no masses, no thyromegaly Respiratory: clear to auscultation bilaterally, no wheezing, no crackles. Normal respiratory effort. No accessory muscle use.  Cardiovascular: Regular rate and rhythm, no murmurs / rubs / gallops. No extremity edema. 2+ pedal pulses. No carotid bruits.  Abdomen: no tenderness, no masses palpated. No hepatosplenomegaly. Bowel sounds positive.  Musculoskeletal: no clubbing / cyanosis. No joint deformity upper and lower extremities. Good ROM, no contractures. Normal muscle tone.  Skin: no rashes, lesions, ulcers. No induration Neurologic: CN 2-12 grossly intact. Sensation intact, DTR normal. Strength  5/5 in all 4.  Psychiatric: Normal judgment and insight. Alert and oriented x 3. Normal mood.    Subsequent Medicare wellness visit   1. Risk factors, based on past  M,S,F -cardiovascular disease risk factors include age, gender, obesity   2.  Physical activities: Sedentary   3.  Depression/mood:  History of depression but stable   4.  Hearing:  No perceived issues   5.  ADL's: Independent in all ADLs   6.  Fall risk:  Low fall risk   7.  Home safety: No problems identified   8.  Height weight, and visual acuity: height and weight as above, vision:   Visual Acuity Screening   Right eye Left eye Both eyes  Without correction: 20/20 20/20 20/20   With correction:        9.  Counseling:  Advised routine follow-ups.   10. Lab orders based on risk factors: Laboratory update will be reviewed   11. Referral :  None today   12. Care plan:  Follow-up with me in 6 months   13. Cognitive assessment:  No cognitive impairment   14. Screening: Patient provided with a written and personalized 5-10 year screening schedule in the AVS.   yes   15. Provider List Update:   PCP, pulmonary, urology, neurology, cardiology, psychiatry  16. Advance Directives: Full code   17. Opioids: Patient is not on any opioid prescriptions and has no risk factors for a substance use disorder.   Flowsheet Row PULMONARY REHAB OTHER RESPIRATORY from 10/27/2019 in [facility]  PHQ-9 Total Score 5      Fall Risk  01/04/2020 11/26/2019 09/01/2019 05/06/2019 04/30/2019  Falls in the past year? 0 0 0 0 0  Number falls in past yr: 0 0 0 0 -  Injury with Fall? 0 0 - 0 -     Impression and Plan:  Encounter for preventive health examination  -He has routine eye and dental care. -All immunizations are up-to-date and age-appropriate. -Screening labs today. -Healthy lifestyle discussed in detail. -Had a colonoscopy in 2019 and is a 5-year callback. -He has had elevated PSAs,  prostate biopsy was negative for cancer, scheduled for TURP.  Tremor S/P deep brain stimulator placement -Followed by neurology  Cervical radiculopathy  - Plan: tiZANidine (ZANAFLEX) 2 MG tablet -Has an epidural injection scheduled for next month.  Elevated blood-pressure reading, without diagnosis of hypertension -Blood pressures well controlled in office today.  Vitamin D deficiency  - Plan: VITAMIN D 25 Hydroxy (Vit-D Deficiency, Fractures)  OSA treated with BiPAP -Noted  Mixed simple and mucopurulent chronic bronchitis (  [place])  -Stable, followed by pulmonary.  Elevated PSA -Followed by Dr. Claudia Desanctis, scheduled for TURP  Depression, recurrent ([place])  -Recently started on Lexapro 20 mg by psychiatry.    Patient Instructions   -Nice seeing you today!!  -Lab work today; will notify you once results are available.  -Schedule follow up in 6 months.   Preventive Care 39 Years and Older, Male Preventive care refers to lifestyle choices and visits with your health care provider that can promote health and wellness. This includes:  A yearly physical exam. This is also called an annual wellness visit.  Regular dental and eye exams.  Immunizations.  Screening for certain conditions.  Healthy lifestyle choices, such as: ? Eating a healthy diet. ? Getting regular exercise. ? Not using drugs or products that contain nicotine and tobacco. ? Limiting alcohol use. What can I expect for my preventive care visit? Physical exam Your health care provider will check your:  Height and weight. These may be used to calculate your BMI (body mass index). BMI is a measurement that tells if you are at a healthy weight.  Heart rate and blood pressure.  Body temperature.  Skin for abnormal spots. Counseling Your health care provider may ask you questions about your:  Past medical problems.  Family's medical history.  Alcohol, tobacco, and drug use.  Emotional  well-being.  Home life and relationship well-being.  Sexual activity.  Diet, exercise, and sleep habits.  History of falls.  Memory and ability to understand (cognition).  Work and work Statistician.  Access to firearms. What immunizations do I need? Vaccines are usually given at various ages, according to a schedule. Your health care provider will recommend vaccines for you based on your age, medical history, and lifestyle or other factors, such as travel or where you work.   What tests do I need? Blood tests  Lipid and cholesterol levels. These may be checked every 5 years, or more often depending on your overall health.  Hepatitis C test.  Hepatitis B test. Screening  Lung cancer screening. You may have this screening every year starting at age 13 if you have a 30-pack-year history of smoking and currently smoke or have quit within the past 15 years.  Colorectal cancer screening. ? All adults should have this screening starting at age 12 and continuing until age 39. ? Your health care provider may recommend screening at age 19 if you are at increased risk. ? You will have tests every 1-10 years, depending on your results and the type of screening test.  Prostate cancer screening. Recommendations will vary depending on your family history and other risks.  Genital exam to check for testicular cancer or hernias.  Diabetes screening. ? This is done by checking your blood sugar (glucose) after you have not eaten for a while (fasting). ? You may have this done every 1-3 years.  Abdominal aortic aneurysm (AAA) screening. You may need this if you are a current or former smoker.  STD (sexually transmitted disease) testing, if you are at risk. Follow these instructions at home: Eating and drinking  Eat a diet that includes fresh fruits and vegetables, whole grains, lean protein, and low-fat dairy products. Limit your intake of foods with high amounts of sugar, saturated fats,  and salt.  Take vitamin and mineral supplements as recommended by your health care provider.  Do not drink alcohol if your health care provider tells you not to drink.  If you drink alcohol: ? Limit how  much you have to 0-2 drinks a day. ? Be aware of how much alcohol is in your drink. In the [place] one drink equals one 12 oz bottle of beer (355 mL), one 5 oz glass of wine (148 mL), or one 1 oz glass of hard liquor (44 mL).   Lifestyle  Take daily care of your teeth and gums. Brush your teeth every morning and night with fluoride toothpaste. Floss one time each day.  Stay active. Exercise for at least 30 minutes 5 or more days each week.  Do not use any products that contain nicotine or tobacco, such as cigarettes, e-cigarettes, and chewing tobacco. If you need help quitting, ask your health care provider.  Do not use drugs.  If you are sexually active, practice safe sex. Use a condom or other form of protection to prevent STIs (sexually transmitted infections).  Talk with your health care provider about taking a low-dose aspirin or statin.  Find healthy ways to cope with stress, such as: ? Meditation, yoga, or listening to music. ? Journaling. ? Talking to a trusted person. ? Spending time with friends and family. Safety  Always wear your seat belt while driving or riding in a vehicle.  Do not drive: ? If you have been drinking alcohol. Do not ride with someone who has been drinking. ? When you are tired or distracted. ? While texting.  Wear a helmet and other protective equipment during sports activities.  If you have firearms in your house, make sure you follow all gun safety procedures. What's next?  Visit your health care provider once a year for an annual wellness visit.  Ask your health care provider how often you should have your eyes and teeth checked.  Stay up to date on all vaccines. This information is not intended to replace advice given to you by your  health care provider. Make sure you discuss any questions you have with your health care provider. Document Revised: 09/30/2018 Document Reviewed: 12/26/2017 Elsevier Patient Education  2021 [place], MD [facility]

## 2020-06-17 ENCOUNTER — Other Ambulatory Visit: Payer: Self-pay | Admitting: Family Medicine

## 2020-06-17 DIAGNOSIS — M5412 Radiculopathy, cervical region: Secondary | ICD-10-CM

## 2020-06-17 LAB — COMPREHENSIVE METABOLIC PANEL
ALT: 13 U/L (ref 0–53)
AST: 17 U/L (ref 0–37)
Albumin: 4.5 g/dL (ref 3.5–5.2)
Alkaline Phosphatase: 61 U/L (ref 39–117)
BUN: 23 mg/dL (ref 6–23)
CO2: 19 mEq/L (ref 19–32)
Calcium: 9.5 mg/dL (ref 8.4–10.5)
Chloride: 105 mEq/L (ref 96–112)
Creatinine, Ser: 1.13 mg/dL (ref 0.40–1.50)
GFR: 66.13 mL/min (ref >60.00)
Glucose, Bld: 88 mg/dL (ref 70–99)
Potassium: 4.5 mEq/L (ref 3.5–5.1)
Sodium: 143 mEq/L (ref 135–145)
Total Bilirubin: 0.3 mg/dL (ref 0.2–1.2)
Total Protein: 6.8 g/dL (ref 6.0–8.3)

## 2020-06-17 LAB — LIPID PANEL
Cholesterol: 174 mg/dL (ref 0–200)
HDL: 65 mg/dL (ref >39.00)
LDL Cholesterol: 87 mg/dL (ref 0–99)
NonHDL: 109.4
Total CHOL/HDL Ratio: 3
Triglycerides: 111 mg/dL (ref 0.0–149.0)
VLDL: 22.2 mg/dL (ref 0.0–40.0)

## 2020-06-19 DIAGNOSIS — I5032 Chronic diastolic (congestive) heart failure: Secondary | ICD-10-CM | POA: Diagnosis not present

## 2020-06-19 DIAGNOSIS — G4733 Obstructive sleep apnea (adult) (pediatric): Secondary | ICD-10-CM | POA: Diagnosis not present

## 2020-06-19 DIAGNOSIS — J432 Centrilobular emphysema: Secondary | ICD-10-CM | POA: Diagnosis not present

## 2020-06-19 DIAGNOSIS — S86311A Strain of muscle(s) and tendon(s) of peroneal muscle group at lower leg level, right leg, initial encounter: Secondary | ICD-10-CM | POA: Diagnosis not present

## 2020-06-19 DIAGNOSIS — S86311D Strain of muscle(s) and tendon(s) of peroneal muscle group at lower leg level, right leg, subsequent encounter: Secondary | ICD-10-CM | POA: Diagnosis not present

## 2020-06-20 NOTE — Telephone Encounter (Signed)
ONO results received and placed in C pod box for Dr. Halford Chessman to review.

## 2020-06-29 DIAGNOSIS — F411 Generalized anxiety disorder: Secondary | ICD-10-CM | POA: Diagnosis not present

## 2020-06-29 DIAGNOSIS — R69 Illness, unspecified: Secondary | ICD-10-CM | POA: Diagnosis not present

## 2020-07-01 ENCOUNTER — Ambulatory Visit: Payer: Medicare HMO | Admitting: Podiatry

## 2020-07-01 NOTE — Telephone Encounter (Signed)
ONO results reviewed by Eustaquio Maize, NP- 07/01/20  Spent 19 minutes with SpO2<88%, needs 2 liters oxygen at night.  Results sent through my chart for Patient.  ONO results placed back in Dr. Juanetta Gosling C pod box to be reviewed, if needed.  Patient is currently on O2 2 liters at night per LOV 06/08/20.

## 2020-07-01 NOTE — Progress Notes (Signed)
Assessment/Plan:    1.  Essential Tremor  Patient had DBS surgery on the left brain in [place] in 2016.  Patient status post IPG change in January, 2022.  -Patient having more trouble with the left hand now in terms of tremor (no DBS device on that side).  He asked about focused ultrasound.  I do not think he would be a candidate given that he has a DBS device on the other side.  He asked about [employer].  Ultimately, he this was cost prohibitive.  We discussed DBS on the other side, but he does not want to go through that.  Ultimately, he decided to restart primidone, 50 mg, 1/2 tablet at bed for a week and then increase to 1 tablet at night for 2 weeks and then 1 po bid.  2.  COPD  -Following with Dr. Halford Chessman.  On meds which could contribute to tremors.  Subjective:   Allen [name] was seen today in follow up for essential tremor.  My previous records were reviewed prior to todays visit.  Patient had his IPG changed in January, 2022 with Dr. Vertell Limber.  Records are reviewed, from that as well as most recent primary care records from June 1.  Patient is having recurrent prostatitis and scheduled for TURP.  States that L hand got bad with tremor and asks about focused ultrasound and about cala trio.  Wants to retry med if not an option.  Notes tremor on the right if he is trying to do something like eat soup, but that is the only time.  Current prescribed movement disorder medications: None    PREVIOUS MEDICATIONS:  primidone (pt wanted to restart but felt caused ED at low dose), propranolol, gabapentin; not beta-blocker candidate now because of asthma  ALLERGIES:  No Known Allergies  CURRENT MEDICATIONS:  Outpatient Encounter Medications as of 07/04/2020  Medication Sig   albuterol (PROVENTIL) (2.5 MG/3ML) 0.083% nebulizer solution Take 3 mLs (2.5 mg total) by nebulization every 6 (six) hours.   albuterol (VENTOLIN HFA) 108 (90 Base) MCG/ACT inhaler INHALE 2 PUFFS INTO THE LUNGS EVERY 4  HOURS AS NEEDED FOR WHEEZE OR FOR SHORTNESS OF BREATH   Ascorbic Acid (VITAMIN C) 1000 MG tablet Take 1,000 mg by mouth daily.   azelastine (ASTELIN) 0.1 % nasal spray Place 1-2 sprays into both nostrils 2 (two) times daily as needed for rhinitis.   escitalopram (LEXAPRO) 20 MG tablet Take 20 mg by mouth daily.   fluticasone (FLONASE) 50 MCG/ACT nasal spray Place 2 sprays into both nostrils daily as needed for allergies or rhinitis.   Fluticasone-Umeclidin-Vilant (TRELEGY ELLIPTA) 200-62.5-25 MCG/INH AEPB Inhale 1 puff into the lungs daily.   folic acid (FOLVITE) 144 MCG tablet Take 400 mcg by mouth daily.   montelukast (SINGULAIR) 10 MG tablet Take 1 tablet (10 mg total) by mouth at bedtime.   omeprazole (PRILOSEC) 20 MG capsule TAKE 1 CAPSULE BY MOUTH EVERY DAY (Patient taking differently: daily as needed.)   silodosin (RAPAFLO) 8 MG CAPS capsule Take 8 mg by mouth daily.   tadalafil (CIALIS) 20 MG tablet Take 1 tablet (20 mg total) by mouth daily as needed for erectile dysfunction. (Patient taking differently: Take 10 mg by mouth daily.)   tiZANidine (ZANAFLEX) 2 MG tablet TAKE 1 TABLET BY MOUTH EVERYDAY AT BEDTIME   traZODone (DESYREL) 100 MG tablet Take 1 tablet (100 mg total) by mouth at bedtime.   zolpidem (AMBIEN) 10 MG tablet Take 10 mg by mouth at  bedtime as needed for sleep.   No facility-administered encounter medications on file as of 07/04/2020.     Objective:    PHYSICAL EXAMINATION:    VITALS:   Vitals:   07/04/20 1424  BP: 124/62  Pulse: 96  SpO2: 95%  Weight: 241 lb (109.3 kg)  Height: 5\' 10"  (1.778 m)    GEN:  The patient appears stated age and is in NAD. HEENT:  Normocephalic, atraumatic.  The mucous membranes are moist. The superficial temporal arteries are without ropiness or tenderness. CV:  RRR Lungs:  CTAB Neck/HEME:  There are no carotid bruits bilaterally.  Neurological examination:  Orientation: The patient is alert and oriented x3. Cranial  nerves: There is good facial symmetry. The speech is fluent and clear. Soft palate rises symmetrically and there is no tongue deviation. Hearing is intact to conversational tone. Sensation: Sensation is intact to light touch throughout Motor: Strength is at least antigravity x4.  Movement examination: Tone: There is normal tone in the UE/LE Abnormal movements: No tremor of the right hand.  He does have mild to moderate postural tremor of the left hand.  I have reviewed and interpreted the following labs independently   Chemistry      Component Value Date/Time   NA 143 06/15/2020 1404   K 4.5 06/15/2020 1404   CL 105 06/15/2020 1404   CO2 19 06/15/2020 1404   BUN 23 06/15/2020 1404   CREATININE 1.13 06/15/2020 1404      Component Value Date/Time   CALCIUM 9.5 06/15/2020 1404   ALKPHOS 61 06/15/2020 1404   AST 17 06/15/2020 1404   ALT 13 06/15/2020 1404   BILITOT 0.3 06/15/2020 1404      Lab Results  Component Value Date   WBC 8.1 06/15/2020   HGB 13.3 06/15/2020   HCT 40.1 06/15/2020   MCV 95.0 06/15/2020   PLT 295.0 06/15/2020   Lab Results  Component Value Date   TSH 1.69 06/15/2020     Chemistry      Component Value Date/Time   NA 143 06/15/2020 1404   K 4.5 06/15/2020 1404   CL 105 06/15/2020 1404   CO2 19 06/15/2020 1404   BUN 23 06/15/2020 1404   CREATININE 1.13 06/15/2020 1404      Component Value Date/Time   CALCIUM 9.5 06/15/2020 1404   ALKPHOS 61 06/15/2020 1404   AST 17 06/15/2020 1404   ALT 13 06/15/2020 1404   BILITOT 0.3 06/15/2020 1404         Total time spent on today's visit was 20 minutes, including both face-to-face time and nonface-to-face time.  Time included that spent on review of records (prior notes available to me/labs/imaging if pertinent), discussing treatment and goals, answering patient's questions and coordinating care.  This was separate from the DBS time.  Cc:  Isaac Bliss, Rayford Halsted, MD

## 2020-07-04 ENCOUNTER — Ambulatory Visit: Payer: Medicare HMO | Admitting: Neurology

## 2020-07-04 ENCOUNTER — Other Ambulatory Visit: Payer: Self-pay

## 2020-07-04 ENCOUNTER — Encounter: Payer: Self-pay | Admitting: Neurology

## 2020-07-04 DIAGNOSIS — G250 Essential tremor: Secondary | ICD-10-CM

## 2020-07-04 MED ORDER — PRIMIDONE 50 MG PO TABS
50.0000 mg | ORAL_TABLET | Freq: Two times a day (BID) | ORAL | 1 refills | Status: DC
Start: 2020-07-04 — End: 2020-12-21

## 2020-07-04 NOTE — Procedures (Signed)
DBS Programming was performed.  Patient's device interrogated.  DBS confirmed to be on.  Battery not near end-of-life.  Impedances were normal.  Total time spent programming the device was 10 minutes.  Final settings were as follows.  Manufacturer of DBS device: [employer]      Active Contact Amplitude (V) PW (ms) Frequency (hz) Side Effects Battery  Left Brain        Group A        11/26/19 2-C+ 2.1 60 150    01/04/20 2-C+ 2.1 60 150            Group B (active)        11/26/19 2-0+ 2.9 90 170  2.63  01/04/20 2-0+ 2.9 90 170  2.56  07/04/20 2-0+ 3.1 90 170  2.96          Right Brain        n/a

## 2020-07-04 NOTE — Patient Instructions (Signed)
primidone, 50 mg, 1/2 tablet at bed for a week and then increase to 1 tablet at night for 2 weeks and then 1 tablet twice per day thereafter

## 2020-07-05 ENCOUNTER — Encounter: Payer: Medicare HMO | Admitting: Internal Medicine

## 2020-07-15 DIAGNOSIS — R059 Cough, unspecified: Secondary | ICD-10-CM

## 2020-07-15 HISTORY — DX: Cough, unspecified: R05.9

## 2020-07-18 DIAGNOSIS — Z8616 Personal history of COVID-19: Secondary | ICD-10-CM

## 2020-07-18 HISTORY — DX: Personal history of COVID-19: Z86.16

## 2020-07-19 DIAGNOSIS — S86311A Strain of muscle(s) and tendon(s) of peroneal muscle group at lower leg level, right leg, initial encounter: Secondary | ICD-10-CM | POA: Diagnosis not present

## 2020-07-19 DIAGNOSIS — G4733 Obstructive sleep apnea (adult) (pediatric): Secondary | ICD-10-CM | POA: Diagnosis not present

## 2020-07-19 DIAGNOSIS — J432 Centrilobular emphysema: Secondary | ICD-10-CM | POA: Diagnosis not present

## 2020-07-19 DIAGNOSIS — S86311D Strain of muscle(s) and tendon(s) of peroneal muscle group at lower leg level, right leg, subsequent encounter: Secondary | ICD-10-CM | POA: Diagnosis not present

## 2020-07-19 DIAGNOSIS — I5032 Chronic diastolic (congestive) heart failure: Secondary | ICD-10-CM | POA: Diagnosis not present

## 2020-07-24 DIAGNOSIS — U071 COVID-19: Secondary | ICD-10-CM | POA: Diagnosis not present

## 2020-07-24 DIAGNOSIS — R918 Other nonspecific abnormal finding of lung field: Secondary | ICD-10-CM | POA: Diagnosis not present

## 2020-07-26 ENCOUNTER — Other Ambulatory Visit: Payer: Self-pay | Admitting: Allergy & Immunology

## 2020-07-26 DIAGNOSIS — J418 Mixed simple and mucopurulent chronic bronchitis: Secondary | ICD-10-CM

## 2020-07-28 ENCOUNTER — Encounter: Payer: Self-pay | Admitting: Internal Medicine

## 2020-07-28 NOTE — Telephone Encounter (Signed)
    Allen [name] to Allen Mires, MD   DW    1:15 PM I tested positive for Covid on Monday, July 4th, and still tested positive today. I went to urgent care on Sunday, July 10th. They did a chest X-ray and it was negative. At first my symptoms were nasal congestion, but since Monday, I am feeling it more in my chest. Since yesterday, my chest is very tight, but I feel that my breathing is ok. Should I take a course of pregnisone or just let it run it's course? Thank you.     I called and spoke with the pt. He states that he thought that he was feeling better, just had some nasal congestion after first tested pos 7/4, but over the past few days he c/o chest tightness. He is not having any increased SOB, fever, chills, aches, wheezing. He is asking if he can take course of pred that he has on he he says given to him by Dr Halford Chessman. He [facility] 07/24/20 and you can see his notes in care everywhere- cxr report done that day impression reads:   CONCLUSION:   No consolidative pneumonia. Linear opacities in the left lung base are favoredatelectatic. Exam End: 07/24/20 11:53

## 2020-07-28 NOTE — Telephone Encounter (Signed)
Would do prn albuterol up to 6 times a day as needed for chest tightness. If symptoms are not relieved with albuterol, can take the prednisone. He is out of the window for any other treatment for covid

## 2020-08-01 DIAGNOSIS — R350 Frequency of micturition: Secondary | ICD-10-CM | POA: Diagnosis not present

## 2020-08-01 DIAGNOSIS — N401 Enlarged prostate with lower urinary tract symptoms: Secondary | ICD-10-CM | POA: Diagnosis not present

## 2020-08-03 ENCOUNTER — Ambulatory Visit (INDEPENDENT_AMBULATORY_CARE_PROVIDER_SITE_OTHER): Payer: Medicare HMO | Admitting: Internal Medicine

## 2020-08-03 ENCOUNTER — Encounter (HOSPITAL_BASED_OUTPATIENT_CLINIC_OR_DEPARTMENT_OTHER): Payer: Self-pay | Admitting: Urology

## 2020-08-03 ENCOUNTER — Other Ambulatory Visit: Payer: Self-pay

## 2020-08-03 ENCOUNTER — Encounter: Payer: Self-pay | Admitting: Internal Medicine

## 2020-08-03 VITALS — BP 130/84 | HR 72 | Temp 98.0°F | Ht 70.0 in | Wt 242.6 lb

## 2020-08-03 DIAGNOSIS — U071 COVID-19: Secondary | ICD-10-CM | POA: Diagnosis not present

## 2020-08-03 MED ORDER — BENZONATATE 100 MG PO CAPS: 100.0000 mg | ORAL_CAPSULE | Freq: Two times a day (BID) | ORAL | 0 refills | Status: DC | PRN

## 2020-08-03 NOTE — Progress Notes (Signed)
Established Patient Office Visit     This visit occurred during the SARS-CoV-2 public health emergency.  Safety protocols were in place, including screening questions prior to the visit, additional usage of staff PPE, and extensive cleaning of exam room while observing appropriate contact time as indicated for disinfecting solutions.    CC/Reason for Visit: Post COVID cough  HPI: Allen [name] is a 70 y.o. male who is coming in today for the above mentioned reasons.  He has a history of COPD.  While on an [place] cruise over the July 4 holiday he developed a COVID infection.  He cut his vacation short and came back home.  Since his return he has seen the urgent care, had a chest x-ray that was negative for pneumonia.  He is feeling overall improved although he still has significant coughing with rib cage pain.  He is requesting refill of Tessalon Perles.  He is concerned because he has an upcoming prostate surgery and has already been told that it will be delayed if he test positive for COVID.  He has had 2 negative home test in the last few days.  Past Medical/Surgical History: Past Medical History:  Diagnosis Date   Anxiety    Arthritis    neck - no meds   Asthma    Bilateral cataracts    removed by surgery   COPD (chronic obstructive pulmonary disease) (HCC)    Dyspnea    occasional with exertion   ED (erectile dysfunction)    Essential tremor    with nerve stimulator   GERD (gastroesophageal reflux disease)    Insomnia    OSA treated with BiPAP    does not use bipap - uses 2 L oxygen via [place] while sleeping   Recurrent upper respiratory infection (URI)    Right carpal tunnel syndrome     Past Surgical History:  Procedure Laterality Date   ADENOIDECTOMY     ANKLE SURGERY Right    APPENDECTOMY  2015   CARPAL TUNNEL RELEASE Left 2019   CATARACT EXTRACTION Bilateral 2020   COLONOSCOPY     DEEP BRAIN STIMULATOR PLACEMENT Left 2016   HERNIA REPAIR Right 2007    ROTATOR CUFF REPAIR Right 2014   SUBTHALAMIC STIMULATOR BATTERY REPLACEMENT N/A 01/21/2020   Procedure: Deep brain stimulator battery replacement;  Surgeon: Erline Levine, MD;  Location: [facility];  Service: Neurosurgery;  Laterality: N/A;   TONSILLECTOMY     TOTAL SHOULDER REPLACEMENT Left 2016   VEIN SURGERY Bilateral 2009   varicose veins- bilateral    Social History:  reports that he quit smoking about 31 years ago. His smoking use included cigarettes. He has a 60.00 pack-year smoking history. He has quit using smokeless tobacco. He reports previous alcohol use of about 14.0 standard drinks of alcohol per week. He reports that he does not use drugs.  Allergies: No Known Allergies  Family History:  Family History  Problem Relation Age of Onset   COPD Mother    Asthma Father    Rectal cancer Sister    Healthy Child    Urticaria Neg Hx    Immunodeficiency Neg Hx    Eczema Neg Hx    Atopy Neg Hx    Angioedema Neg Hx    Allergic rhinitis Neg Hx      Current Outpatient Medications:    albuterol (PROVENTIL) (2.5 MG/3ML) 0.083% nebulizer solution, Take 3 mLs (2.5 mg total) by nebulization every 6 (six) hours., Disp: 150 mL,  Rfl: 3   albuterol (VENTOLIN HFA) 108 (90 Base) MCG/ACT inhaler, INHALE 2 PUFFS INTO THE LUNGS EVERY 4 HOURS AS NEEDED FOR WHEEZE OR FOR SHORTNESS OF BREATH, Disp: 8.5 each, Rfl: 0   Ascorbic Acid (VITAMIN C) 1000 MG tablet, Take 1,000 mg by mouth daily., Disp: , Rfl:    azelastine (ASTELIN) 0.1 % nasal spray, Place 1-2 sprays into both nostrils 2 (two) times daily as needed for rhinitis., Disp: 30 mL, Rfl: 5   benzonatate (TESSALON) 100 MG capsule, Take 1 capsule (100 mg total) by mouth 2 (two) times daily as needed for cough., Disp: 20 capsule, Rfl: 0   escitalopram (LEXAPRO) 20 MG tablet, Take 20 mg by mouth daily., Disp: , Rfl:    fluticasone (FLONASE) 50 MCG/ACT nasal spray, Place 2 sprays into both nostrils daily as needed for allergies or rhinitis., Disp: 16 g,  Rfl: 5   Fluticasone-Umeclidin-Vilant (TRELEGY ELLIPTA) 200-62.5-25 MCG/INH AEPB, Inhale 1 puff into the lungs daily., Disp: 3 each, Rfl: 3   folic acid (FOLVITE) 409 MCG tablet, Take 400 mcg by mouth daily., Disp: , Rfl:    montelukast (SINGULAIR) 10 MG tablet, Take 1 tablet (10 mg total) by mouth at bedtime., Disp: 90 tablet, Rfl: 1   omeprazole (PRILOSEC) 20 MG capsule, TAKE 1 CAPSULE BY MOUTH EVERY DAY (Patient taking differently: daily as needed.), Disp: 90 capsule, Rfl: 0   primidone (MYSOLINE) 50 MG tablet, Take 1 tablet (50 mg total) by mouth 2 (two) times daily., Disp: 180 tablet, Rfl: 1   silodosin (RAPAFLO) 8 MG CAPS capsule, Take 8 mg by mouth daily., Disp: , Rfl:    tadalafil (CIALIS) 20 MG tablet, Take 1 tablet (20 mg total) by mouth daily as needed for erectile dysfunction. (Patient taking differently: Take 10 mg by mouth daily.), Disp: 90 tablet, Rfl: 3   traZODone (DESYREL) 100 MG tablet, Take 1 tablet (100 mg total) by mouth at bedtime., Disp: 90 tablet, Rfl: 1   zolpidem (AMBIEN) 10 MG tablet, Take 10 mg by mouth at bedtime as needed for sleep., Disp: , Rfl:    tiZANidine (ZANAFLEX) 2 MG tablet, TAKE 1 TABLET BY MOUTH EVERYDAY AT BEDTIME (Patient not taking: Reported on 08/03/2020), Disp: 90 tablet, Rfl: 1  Review of Systems:  Constitutional: Denies fever, chills, diaphoresis, appetite change and fatigue.  HEENT: Denies photophobia, eye pain, redness, hearing loss, ear pain, congestion, sore throat, rhinorrhea, sneezing, mouth sores, trouble swallowing, neck pain, neck stiffness and tinnitus.   Respiratory: Denies SOB, DOE  and wheezing.   Cardiovascular: Denies chest pain, palpitations and leg swelling.  Gastrointestinal: Denies nausea, vomiting, abdominal pain, diarrhea, constipation, blood in stool and abdominal distention.  Genitourinary: Denies dysuria, urgency, frequency, hematuria, flank pain and difficulty urinating.  Endocrine: Denies: hot or cold intolerance, sweats,  changes in hair or nails, polyuria, polydipsia. Musculoskeletal: Denies myalgias, back pain, joint swelling, arthralgias and gait problem.  Skin: Denies pallor, rash and wound.  Neurological: Denies dizziness, seizures, syncope, weakness, light-headedness, numbness and headaches.  Hematological: Denies adenopathy. Easy bruising, personal or family bleeding history  Psychiatric/Behavioral: Denies suicidal ideation, mood changes, confusion, nervousness, sleep disturbance and agitation    Physical Exam: Vitals:   08/03/20 0849  BP: 130/84  Pulse: 72  Temp: 98 F (36.7 C)  TempSrc: Oral  SpO2: 96%  Weight: 242 lb 9.6 oz (110 kg)  Height: 5\' 10"  (1.778 m)    Body mass index is 34.81 kg/m.   Constitutional: NAD, calm, comfortable Eyes: PERRL, lids  and conjunctivae normal ENMT: Mucous membranes are moist.  Respiratory: clear to auscultation bilaterally, no wheezing, no crackles. Normal respiratory effort. No accessory muscle use.  Cardiovascular: Regular rate and rhythm, no murmurs / rubs / gallops. No extremity edema.  Neurologic: Grossly intact and nonfocal Psychiatric: Normal judgment and insight. Alert and oriented x 3. Normal mood.    Impression and Plan:  COVID-19  - Plan: benzonatate (TESSALON) 100 MG capsule -He has passed his quarantine.,  He is symptomatically improved although still remains with cough and rib cage pain.  Tessalon refill given today.  Lungs sound clear, no evidence of pneumonia.     Patient Instructions      Jatin Naumann Isaac Bliss, MD [facility]

## 2020-08-03 NOTE — Progress Notes (Addendum)
ADDENDUM:  Chart reviewed w/ anesthesia, Dr Darnell Level. Rose MDA, stated ok to proceed.  Pt will to have 2L oxygen at night since this is what he uses for OSA.   Spoke w/ via phone for pre-op interview--- Pt Lab needs dos---- no              Lab results------ current chest CT in epic COVID test ----- pt does not need covid test since he had positive home test 07-18-2020 and a provider documented this in office note 07-24-2020 in care everywhere (printed note placed in epic)  Arrive at ------- [facility] on 08-09-2020 NPO after MN NO Solid Food.  Clear liquids from MN until--- 0615 Med rec completed Medications to take morning of surgery ----- Rapaflo, Prilosec, Primidone, Trelegy inhaler Diabetic medication ----- n/a Patient instructed no nail polish to be worn day of surgery Patient instructed to bring photo id and insurance card day of surgery  Patient aware to have Driver (ride ) / caregiver   for 24 hours after surgery --wife, Donzetta Sprung Patient Special Instructions ----- reviewed [facility] and visitor guidelines . Asked pt to bring trelegy and rescue inhaler w/ him dos Pre-Op special Istructions ----- pt has control to deep brain stimulator, told to bring dos.  Patient verbalized understanding of instructions that were given at this phone interview. Patient denies acute shortness of breath, chest pain, fever, cough at this phone interview.    Anesthesia Review: COPD w/ asthma;  Centrilobular emphysema; mix simple and mucopurulent chronic bronchitis;  pulmonary nodules;  OSA intolerant to bipap, prescribed oxygen 2L via [place] when sleeping,  pt stated does not use supplementally.  Stated checks O2 sat periodically during the day 96--98% on RA;   S/P DPS 12/ 2016 for essential tremor's  with IPG change 01/ 2022,  pt has a control, pt told to bring dos.   Pt covid positive 07-18-2020, stated with symptoms mild with residual cough with no congestion, had follow-up with pcp 08-03-2020 in epic, stated lungs  clear.  Last used nebulizer >6 months, last rescue use 2 days ago due to walking outside in the heat.  PCP: Dr York Grice (lov 08-03-2020 epic) Pulmonologist:  Dr Halford Chessman [facility] 06-08-2020 epic) Neurologist:  Dr Tat Cassell Clement 07-04-2020 epic) Cardiologist:  Dr C. Bridgette (lov 05-26-2019 epic, new pt evaluation for sob & enlarged heart)  Chest x-ray : CT 05-11-2020 epic EKG :  05-26-2019 epic Echo : 05-29-2019 epic Stress test: no Cardiac Cath : no Activity level: DOE Sleep Study/ CPAP : YES/  no uses O2 nightly

## 2020-08-05 ENCOUNTER — Other Ambulatory Visit (HOSPITAL_COMMUNITY): Payer: Medicare HMO

## 2020-08-08 NOTE — Anesthesia Preprocedure Evaluation (Addendum)
Anesthesia Evaluation  Patient identified by MRN, date of birth, ID band Patient awake    Reviewed: Allergy & Precautions, NPO status , Patient's Chart, lab work & pertinent test results  Airway Mallampati: II  TM Distance: >3 FB Neck ROM: Full    Dental no notable dental hx. (+) Edentulous Upper, Implants, Dental Advisory Given   Pulmonary asthma , sleep apnea and Continuous Positive Airway Pressure Ventilation , COPD (on 2 L), former smoker,    Pulmonary exam normal breath sounds clear to auscultation       Cardiovascular Exercise Tolerance: Good Normal cardiovascular exam Rhythm:Regular Rate:Normal  05/29/19 TTE 1. Left ventricular ejection fraction, by estimation, is 60 to 65%. The  left ventricle has normal function. The left ventricle has no regional  wall motion abnormalities. There is mild concentric left ventricular  hypertrophy. Left ventricular diastolic  parameters are consistent with Grade I diastolic dysfunction (impaired  relaxation).  2. Right ventricular systolic function is normal. The right ventricular  size is normal. Tricuspid regurgitation signal is inadequate for assessing  PA pressure.  3. Left atrial size was moderately dilated.  4. The mitral valve is normal in structure. No evidence of mitral valve  regurgitation. No evidence of mitral stenosis.  5. The aortic valve is normal in structure. Aortic valve regurgitation is  not visualized. No aortic stenosis is present.  6. The inferior vena cava is normal in size with greater than 50%  respiratory variability, suggesting right atrial pressure of 3 mmHg.    Neuro/Psych Anxiety negative neurological ROS     GI/Hepatic Neg liver ROS, PUD, GERD  ,  Endo/Other  negative endocrine ROS  Renal/GU Lab Results      Component                Value               Date                      CREATININE               1.13                06/15/2020                 BUN                      23                  06/15/2020                NA                       143                 06/15/2020                K                        4.5                 06/15/2020                CL                       105  06/15/2020                CO2                      19                  06/15/2020              Prostate hyperplasia    Musculoskeletal  (+) Arthritis ,   Abdominal (+) + obese (BMI 34.81),   Peds  Hematology Lab Results      Component                Value               Date                      WBC                      8.1                 06/15/2020                HGB                      13.3                06/15/2020                HCT                      40.1                06/15/2020                MCV                      95.0                06/15/2020                PLT                      295.0               06/15/2020              Anesthesia Other Findings   Reproductive/Obstetrics                           Anesthesia Physical Anesthesia Plan  ASA: 3  Anesthesia Plan: General   Post-op Pain Management:    Induction: Intravenous  PONV Risk Score and Plan: 3 and Treatment may vary due to age or medical condition  Airway Management Planned: LMA  Additional Equipment: None  Intra-op Plan:   Post-operative Plan:   Informed Consent: I have reviewed the patients History and Physical, chart, labs and discussed the procedure including the risks, benefits and alternatives for the proposed anesthesia with the patient or authorized representative who has indicated his/her understanding and acceptance.     Dental advisory given  Plan Discussed with: CRNA  Anesthesia Plan Comments:        Anesthesia Quick Evaluation

## 2020-08-08 NOTE — H&P (Signed)
CC/HPI: cc: elevated PSA, BPH with LUTs   05/05/20: 70 year old man with a history of an elevated PSA status post negative prostate biopsy March 2021, ED managed with daily Cialis and BPH. Patient did have a post biopsy UTI. He was seen for an acute visit in January 2022 and diagnosed and treated for acute prostatitis. He was treated for Klebsiella UTI at that time. He stopped his alpha-blocker after his last visit for unknown reasons. Patient had significant increase in PSA during that period with a PSA of 18.27. He is able to empty his bladder however said it is mostly drill at comes out. PVR 13 cc. Repeat PSA 16.3 on 04/28/2020.   06/03/20: 70 year old man with a history of an elevated PSA with a negative saturation prostate biopsy last week. He is unable to get an MRI of the prostate due to his DBS. Patient had UTI several months ago and is still having difficulty emptying his bladder. Prostate size 43 g on transrectal ultrasound. His most bothersome urinary symptom is intermittency. He is going on a cruise to [place] in June but would like surgical intervention after that. He has not responded well to Uroxatral. He is also on daily Cialis.   Interval: Today, he presents for preoperative appointment prior to undergoing TURP. He feels that all of his questions have been sufficiently answered. He denies interval fevers or chills. He has had no changes to his health history. He denies shortness of breath and chest pain. He continues Rapaflo 8 mg. His PVR today is 53 mL.     ALLERGIES: No Known Allergies    MEDICATIONS: Cialis 20 mg tablet 1 tablet PO Daily PRN sexual activity  Rapaflo 8 mg capsule 1 capsule PO Daily  Coq-10  Eye Vitamin  Fish Oil  Folic Acid  Lexapro  Magnesium  Trazodone Hcl 100 mg tablet  Trilogy  Vitamin C  Vitamin D3  Zinc  Zolpidem Tartrate 1.75 mg tablet, sublingual     GU PSH: Prostate Needle Biopsy - 05/27/2020, 04/02/2019     NON-GU PSH: Anesth, Shoulder  Replacement, Left Appendectomy Carpal tunnel surgery, Left Cataract surgery, Bilateral Hernia Repair, Right Leg surgery (unspecified), Bilateral Rotator cuff surgery, Right Surgical Pathology, Gross And Microscopic Examination For Prostate Needle - 05/27/2020, 04/02/2019     GU PMH: BPH w/LUTS - 06/03/2020, Patient is not emptying his bladder well and would like to resume Uroxatral. He will let me know if he has adverse side effects. PVR in the office today 13 cc and no sign of infection. , - 05/05/2020, Patient would like to try Uroxatral to see if this helps his urinary symptoms., - 11/23/2019, Will give patient trial of Flomax 0.4 mg to take at night. He takes his Cialis in the morning. He was counseled about possible side effects., - 04/09/2019 ED due to arterial insufficiency - 06/03/2020, Continue Cialis '10mg'$  daily, - 11/23/2019, Patient is managed with 10 mg of daily Cialis. Although this is not typical dose patient has been on this for a while and tolerated just fine okay continue., - 12/04/2018 Elevated PSA - 06/03/2020, - 05/27/2020, Patient's repeat PSA did not decline as expected. PSA remains elevated at 16.3. Patient is now able to have an MRI of the prostate and will plan on getting an expedited study. Based on results patient understands he may need an MR-ultrasound fusion biopsy., - 05/05/2020, PSA of 18, he has had recent prostatitis which likely explains this elevation., - 03/11/2020, He will have PSA repeated in the  Spring of this year., - 02/02/2020, I would like patient to repeat PSA today. If it remains elevated I think he needs a repeat prostate biopsy. He is unable to get an MRI due to the deep brain stimulator., - 11/23/2019, Discussed possible reasons for elevated PSA including recent infection, trauma, inflammation, indwelling catheter, enlarged prostate and prostate cancer. I would like to schedule transrectal ultrasound guided prostate biopsy for patient. I discussed risks and benefits of  prostate biospy including blood in the urine/stool/semen, pain, and risk of post biopsy sepsis. He was given instructions regarding the prostate biospy. He will take 1 tab of levaquin '750mg'$  PO the morning of his prostate biopsy. Patient understands and agrees with the above. , - 03/02/2019 Urinary Hesitancy - 06/03/2020 Urinary Frequency - 05/05/2020, - 04/15/2019 Acute prostatitis - 03/11/2020, - 02/19/2020, - 02/02/2020 Nocturia - 11/23/2019, - 08/20/2019 Gross hematuria - 04/15/2019 Encounter for Prostate Cancer screening (Stable, Chronic), DRE today within normal limits. Will send patient to lab for PSA and call with results. He may return in 1 year for repeat PSA and DRE. - 12/04/2018    NON-GU PMH: GERD Sleep Apnea    FAMILY HISTORY: Asthma - Father copd - Mother Death - Father, Mother rectal cancer - Sister   SOCIAL HISTORY: Marital Status: Married Preferred Language: English; Ethnicity: Not Hispanic Or Latino; Race: White Current Smoking Status: Patient does not smoke anymore.   Tobacco Use Assessment Completed: Used Tobacco in last 30 days? Drinks 2 drinks per day.  Drinks 2 caffeinated drinks per day. Patient's occupation is/was Retired.    REVIEW OF SYSTEMS:    GU Review Male:   Patient reports stream starts and stops and trouble starting your stream. Patient denies frequent urination, hard to postpone urination, burning/ pain with urination, get up at night to urinate, leakage of urine, have to strain to urinate , erection problems, and penile pain.  Gastrointestinal (Upper):   Patient denies nausea, vomiting, and indigestion/ heartburn.  Gastrointestinal (Lower):   Patient denies diarrhea and constipation.  Constitutional:   Patient denies fever, night sweats, weight loss, and fatigue.  Skin:   Patient denies skin rash/ lesion and itching.  Eyes:   Patient denies blurred vision and double vision.  Ears/ Nose/ Throat:   Patient denies sore throat and sinus problems.   Musculoskeletal:   Patient denies back pain and joint pain.  Neurological:   Patient denies headaches and dizziness.  Psychologic:   Patient denies depression and anxiety.   VITAL SIGNS:      08/01/2020 10:17 AM  BP 137/89 mmHg  Pulse 73 /min  Temperature 98.2 F / 36.7 C   MULTI-SYSTEM PHYSICAL EXAMINATION:    Constitutional: Well-nourished. No physical deformities. Normally developed. Good grooming.  Respiratory: No labored breathing, no use of accessory muscles.   Cardiovascular: Normal temperature, normal extremity pulses, no swelling, no varicosities.  Skin: No paleness, no jaundice, no cyanosis. No lesion, no ulcer, no rash.  Neurologic / Psychiatric: Oriented to time, oriented to place, oriented to person. No depression, no anxiety, no agitation.  Gastrointestinal: No mass, no tenderness, no rigidity, non obese abdomen.  Musculoskeletal: Normal gait and station of head and neck.     Complexity of Data:  Source Of History:  Patient, Medical Record Summary  Records Review:   Previous Doctor Records, Previous Patient Records  Urine Test Review:   Urinalysis  Urodynamics Review:   Review Bladder Scan   04/28/20 11/23/19 08/20/19 12/04/18  PSA  Total PSA 16.30 ng/mL  4.19 ng/mL 5.96 ng/mL 3.63 ng/mL    08/01/20  Urinalysis  Urine Appearance Clear   Urine Color Yellow   Urine Glucose Neg mg/dL  Urine Bilirubin Neg mg/dL  Urine Ketones Neg mg/dL  Urine Specific Gravity 1.025   Urine Blood Neg ery/uL  Urine pH <=5.0   Urine Protein Neg mg/dL  Urine Urobilinogen 0.2 mg/dL  Urine Nitrites Neg   Urine Leukocyte Esterase Neg leu/uL   PROCEDURES:         PVR Ultrasound - [id number]  Scanned Volume: 53 cc         Urinalysis Dipstick Dipstick Cont'd  Color: Yellow Bilirubin: Neg mg/dL  Appearance: Clear Ketones: Neg mg/dL  Specific Gravity: 1.025 Blood: Neg ery/uL  pH: <=5.0 Protein: Neg mg/dL  Glucose: Neg mg/dL Urobilinogen: 0.2 mg/dL    Nitrites: Neg    Leukocyte  Esterase: Neg leu/uL    ASSESSMENT:      ICD-10 Details  1 GU:   BPH w/LUTS - N40.1 Chronic, Stable   PLAN:           Orders Labs CULTURE, URINE          Schedule Return Visit/Planned Activity: Keep Scheduled Appointment          Document Letter(s):  Created for Patient: Clinical Summary         Notes:   PVR is 53 mL today. His urinalysis is clear. He has had no changes to his medications and is otherwise doing well. Continue silodosin and follow-up for scheduled surgical procedure on 08/09/2020. He understands he needs will need to notify the clinic with any changes to his health history in the interval.

## 2020-08-09 ENCOUNTER — Encounter (HOSPITAL_BASED_OUTPATIENT_CLINIC_OR_DEPARTMENT_OTHER)
Admission: RE | Disposition: A | Discharge status: Home / Self Care | Payer: Self-pay | Source: Home / Self Care | Attending: Urology

## 2020-08-09 ENCOUNTER — Ambulatory Visit (HOSPITAL_BASED_OUTPATIENT_CLINIC_OR_DEPARTMENT_OTHER): Payer: Medicare HMO | Admitting: Anesthesiology

## 2020-08-09 ENCOUNTER — Encounter (HOSPITAL_BASED_OUTPATIENT_CLINIC_OR_DEPARTMENT_OTHER): Payer: Self-pay | Admitting: Urology

## 2020-08-09 ENCOUNTER — Ambulatory Visit (HOSPITAL_BASED_OUTPATIENT_CLINIC_OR_DEPARTMENT_OTHER)
Admission: RE | Admit: 2020-08-09 | Discharge: 2020-08-09 | Disposition: A | Discharge status: Home / Self Care | Payer: Medicare HMO | Attending: Urology | Admitting: Urology

## 2020-08-09 DIAGNOSIS — Z79899 Other long term (current) drug therapy: Secondary | ICD-10-CM | POA: Insufficient documentation

## 2020-08-09 DIAGNOSIS — Z87891 Personal history of nicotine dependence: Secondary | ICD-10-CM | POA: Diagnosis not present

## 2020-08-09 DIAGNOSIS — N320 Bladder-neck obstruction: Secondary | ICD-10-CM | POA: Insufficient documentation

## 2020-08-09 DIAGNOSIS — J449 Chronic obstructive pulmonary disease, unspecified: Secondary | ICD-10-CM | POA: Diagnosis not present

## 2020-08-09 DIAGNOSIS — J418 Mixed simple and mucopurulent chronic bronchitis: Secondary | ICD-10-CM

## 2020-08-09 DIAGNOSIS — N138 Other obstructive and reflux uropathy: Secondary | ICD-10-CM | POA: Diagnosis present

## 2020-08-09 DIAGNOSIS — N401 Enlarged prostate with lower urinary tract symptoms: Secondary | ICD-10-CM | POA: Diagnosis present

## 2020-08-09 DIAGNOSIS — N329 Bladder disorder, unspecified: Secondary | ICD-10-CM | POA: Diagnosis not present

## 2020-08-09 DIAGNOSIS — N3289 Other specified disorders of bladder: Secondary | ICD-10-CM | POA: Diagnosis not present

## 2020-08-09 DIAGNOSIS — Z87440 Personal history of urinary (tract) infections: Secondary | ICD-10-CM | POA: Diagnosis not present

## 2020-08-09 DIAGNOSIS — N3080 Other cystitis without hematuria: Secondary | ICD-10-CM | POA: Diagnosis not present

## 2020-08-09 DIAGNOSIS — N400 Enlarged prostate without lower urinary tract symptoms: Secondary | ICD-10-CM | POA: Diagnosis not present

## 2020-08-09 DIAGNOSIS — N3030 Trigonitis without hematuria: Secondary | ICD-10-CM | POA: Insufficient documentation

## 2020-08-09 HISTORY — DX: Other forms of dyspnea: R06.09

## 2020-08-09 HISTORY — DX: Asymptomatic varicose veins of unspecified lower extremity: I83.90

## 2020-08-09 HISTORY — DX: Other nonspecific abnormal finding of lung field: R91.8

## 2020-08-09 HISTORY — DX: Other specified chronic obstructive pulmonary disease: J44.89

## 2020-08-09 HISTORY — DX: Obstructive sleep apnea (adult) (pediatric): G47.33

## 2020-08-09 HISTORY — DX: Centrilobular emphysema: J43.2

## 2020-08-09 HISTORY — DX: Dyspnea, unspecified: R06.00

## 2020-08-09 HISTORY — DX: Other allergic rhinitis: J30.89

## 2020-08-09 HISTORY — DX: Presence of dental prosthetic device (complete) (partial): Z97.2

## 2020-08-09 HISTORY — DX: Chronic obstructive pulmonary disease, unspecified: J44.9

## 2020-08-09 HISTORY — DX: Other obstructive and reflux uropathy: N13.8

## 2020-08-09 HISTORY — DX: Other obstructive and reflux uropathy: N40.1

## 2020-08-09 HISTORY — PX: TRANSURETHRAL RESECTION OF PROSTATE: SHX73

## 2020-08-09 HISTORY — DX: Mixed simple and mucopurulent chronic bronchitis: J41.8

## 2020-08-09 HISTORY — DX: Benign prostatic hyperplasia with lower urinary tract symptoms: N40.1

## 2020-08-09 SURGERY — TURP (TRANSURETHRAL RESECTION OF PROSTATE)
Anesthesia: General | Site: Prostate

## 2020-08-09 MED ORDER — ONDANSETRON HCL 4 MG/2ML IJ SOLN
INTRAMUSCULAR | Status: DC | PRN
  Administered 2020-08-09: 4 mg via INTRAVENOUS

## 2020-08-09 MED ORDER — AMISULPRIDE (ANTIEMETIC) 5 MG/2ML IV SOLN: 10.0000 mg | Freq: Once | INTRAVENOUS | Status: DC | PRN

## 2020-08-09 MED ORDER — SODIUM CHLORIDE 0.9% FLUSH: 3.0000 mL | INTRAVENOUS | Status: DC | PRN

## 2020-08-09 MED ORDER — HYDROCODONE-ACETAMINOPHEN 5-325 MG PO TABS
ORAL_TABLET | ORAL | Status: AC
  Filled 2020-08-09: qty 1

## 2020-08-09 MED ORDER — HYDROMORPHONE HCL 1 MG/ML IJ SOLN
0.2500 mg | INTRAMUSCULAR | Status: DC | PRN
  Administered 2020-08-09: 0.25 mg via INTRAVENOUS

## 2020-08-09 MED ORDER — 0.9 % SODIUM CHLORIDE (POUR BTL) OPTIME
TOPICAL | Status: DC | PRN
  Administered 2020-08-09: 1000 mL

## 2020-08-09 MED ORDER — PROPOFOL 10 MG/ML IV BOLUS
INTRAVENOUS | Status: DC | PRN
  Administered 2020-08-09: 150 mg via INTRAVENOUS
  Administered 2020-08-09: 20 mg via INTRAVENOUS
  Administered 2020-08-09: 50 mg via INTRAVENOUS

## 2020-08-09 MED ORDER — PROPOFOL 500 MG/50ML IV EMUL
INTRAVENOUS | Status: AC
  Filled 2020-08-09: qty 50

## 2020-08-09 MED ORDER — FENTANYL CITRATE (PF) 100 MCG/2ML IJ SOLN
INTRAMUSCULAR | Status: AC
  Filled 2020-08-09: qty 2

## 2020-08-09 MED ORDER — ONDANSETRON HCL 4 MG/2ML IJ SOLN: 4.0000 mg | INTRAMUSCULAR | Status: DC | PRN

## 2020-08-09 MED ORDER — HYDROCODONE-ACETAMINOPHEN 5-325 MG PO TABS: 1.0000 | ORAL_TABLET | ORAL | 0 refills | Status: DC | PRN

## 2020-08-09 MED ORDER — SODIUM CHLORIDE 0.9 % IR SOLN: 3000.0000 mL | Status: DC

## 2020-08-09 MED ORDER — DIPHENHYDRAMINE HCL 12.5 MG/5ML PO ELIX: 12.5000 mg | ORAL_SOLUTION | Freq: Four times a day (QID) | ORAL | Status: DC | PRN

## 2020-08-09 MED ORDER — STERILE WATER FOR IRRIGATION IR SOLN
Status: DC | PRN
  Administered 2020-08-09: 500 mL via INTRAVESICAL

## 2020-08-09 MED ORDER — SODIUM CHLORIDE 0.9% FLUSH: 3.0000 mL | Freq: Two times a day (BID) | INTRAVENOUS | Status: DC

## 2020-08-09 MED ORDER — LIDOCAINE HCL (CARDIAC) PF 100 MG/5ML IV SOSY
PREFILLED_SYRINGE | INTRAVENOUS | Status: DC | PRN
  Administered 2020-08-09: 100 mg via INTRAVENOUS

## 2020-08-09 MED ORDER — CEPHALEXIN 500 MG PO CAPS: 500.0000 mg | ORAL_CAPSULE | Freq: Once | ORAL | 0 refills | Status: AC

## 2020-08-09 MED ORDER — BACITRACIN-NEOMYCIN-POLYMYXIN OINTMENT TUBE
TOPICAL_OINTMENT | CUTANEOUS | Status: AC
  Filled 2020-08-09: qty 14.17

## 2020-08-09 MED ORDER — SODIUM CHLORIDE 0.9 % IV SOLN
2.0000 g | INTRAVENOUS | Status: AC
  Administered 2020-08-09: 2 g via INTRAVENOUS

## 2020-08-09 MED ORDER — DEXAMETHASONE SODIUM PHOSPHATE 10 MG/ML IJ SOLN
INTRAMUSCULAR | Status: AC
  Filled 2020-08-09: qty 1

## 2020-08-09 MED ORDER — ACETAMINOPHEN 10 MG/ML IV SOLN: 1000.0000 mg | Freq: Once | INTRAVENOUS | Status: DC | PRN

## 2020-08-09 MED ORDER — ONDANSETRON HCL 4 MG/2ML IJ SOLN: 4.0000 mg | Freq: Once | INTRAMUSCULAR | Status: DC | PRN

## 2020-08-09 MED ORDER — DIPHENHYDRAMINE HCL 50 MG/ML IJ SOLN: 12.5000 mg | Freq: Four times a day (QID) | INTRAMUSCULAR | Status: DC | PRN

## 2020-08-09 MED ORDER — MORPHINE SULFATE (PF) 4 MG/ML IV SOLN: 2.0000 mg | INTRAVENOUS | Status: DC | PRN

## 2020-08-09 MED ORDER — SODIUM CHLORIDE 0.9 % IV SOLN: 250.0000 mL | INTRAVENOUS | Status: DC | PRN

## 2020-08-09 MED ORDER — BELLADONNA ALKALOIDS-OPIUM 16.2-60 MG RE SUPP
1.0000 | Freq: Four times a day (QID) | RECTAL | Status: DC | PRN
Start: 2020-08-09 — End: 2020-08-09

## 2020-08-09 MED ORDER — DEXAMETHASONE SODIUM PHOSPHATE 4 MG/ML IJ SOLN
INTRAMUSCULAR | Status: DC | PRN
Start: 2020-08-09 — End: 2020-08-09
  Administered 2020-08-09: 4 mg via INTRAVENOUS

## 2020-08-09 MED ORDER — LACTATED RINGERS IV SOLN: INTRAVENOUS | Status: DC

## 2020-08-09 MED ORDER — HYDROCODONE-ACETAMINOPHEN 5-325 MG PO TABS
1.0000 | ORAL_TABLET | ORAL | Status: DC | PRN
  Administered 2020-08-09 (×2): 1 via ORAL

## 2020-08-09 MED ORDER — OXYCODONE HCL 5 MG/5ML PO SOLN: 5.0000 mg | Freq: Once | ORAL | Status: DC | PRN

## 2020-08-09 MED ORDER — HYDROMORPHONE HCL 1 MG/ML IJ SOLN
INTRAMUSCULAR | Status: AC
  Filled 2020-08-09: qty 1

## 2020-08-09 MED ORDER — SILODOSIN 8 MG PO CAPS: 8.0000 mg | ORAL_CAPSULE | Freq: Every day | ORAL | 1 refills | Status: DC

## 2020-08-09 MED ORDER — LIDOCAINE HCL (PF) 2 % IJ SOLN
INTRAMUSCULAR | Status: AC
  Filled 2020-08-09: qty 5

## 2020-08-09 MED ORDER — FENTANYL CITRATE (PF) 100 MCG/2ML IJ SOLN
INTRAMUSCULAR | Status: DC | PRN
  Administered 2020-08-09 (×2): 50 ug via INTRAVENOUS

## 2020-08-09 MED ORDER — BACITRACIN-NEOMYCIN-POLYMYXIN 400-5-5000 EX OINT
1.0000 "application " | TOPICAL_OINTMENT | Freq: Three times a day (TID) | CUTANEOUS | Status: DC | PRN
  Administered 2020-08-09: 1 via TOPICAL

## 2020-08-09 MED ORDER — ACETAMINOPHEN 500 MG PO TABS: 1000.0000 mg | ORAL_TABLET | Freq: Four times a day (QID) | ORAL | Status: DC

## 2020-08-09 MED ORDER — SENNOSIDES-DOCUSATE SODIUM 8.6-50 MG PO TABS
2.0000 | ORAL_TABLET | Freq: Every day | ORAL | Status: DC
  Filled 2020-08-09: qty 2

## 2020-08-09 MED ORDER — EPHEDRINE SULFATE 50 MG/ML IJ SOLN
INTRAMUSCULAR | Status: DC | PRN
  Administered 2020-08-09 (×3): 10 mg via INTRAVENOUS

## 2020-08-09 MED ORDER — SODIUM CHLORIDE 0.9 % IV SOLN
INTRAVENOUS | Status: AC
  Filled 2020-08-09: qty 2

## 2020-08-09 MED ORDER — OXYCODONE HCL 5 MG PO TABS
5.0000 mg | ORAL_TABLET | Freq: Once | ORAL | Status: DC | PRN
Start: 2020-08-09 — End: 2020-08-09

## 2020-08-09 MED ORDER — ONDANSETRON HCL 4 MG/2ML IJ SOLN
INTRAMUSCULAR | Status: AC
  Filled 2020-08-09: qty 2

## 2020-08-09 MED ORDER — ONDANSETRON HCL 4 MG/2ML IJ SOLN: INTRAMUSCULAR | Status: DC | PRN

## 2020-08-09 MED ORDER — SODIUM CHLORIDE 0.9 % IR SOLN
Status: DC | PRN
  Administered 2020-08-09 (×2): 6000 mL
  Administered 2020-08-09 (×5): 3000 mL

## 2020-08-09 SURGICAL SUPPLY — 23 items
BAG DRAIN URO-CYSTO SKYTR STRL (DRAIN) ×2 IMPLANT
BAG DRN RND TRDRP ANRFLXCHMBR (UROLOGICAL SUPPLIES) ×1
BAG DRN UROCATH (DRAIN) ×1
BAG URINE DRAIN 2000ML AR STRL (UROLOGICAL SUPPLIES) ×2 IMPLANT
CATH FOLEY 3WAY 30CC 22FR (CATHETERS) IMPLANT
CATH HEMA 3WAY 30CC 22FR COUDE (CATHETERS) IMPLANT
CATH HEMATURIA 20FR (CATHETERS) ×2 IMPLANT
CLOTH BEACON ORANGE TIMEOUT ST (SAFETY) ×2 IMPLANT
GLOVE SURG ENC MOIS LTX SZ6.5 (GLOVE) ×4 IMPLANT
GLOVE SURG UNDER POLY LF SZ6.5 (GLOVE) ×2 IMPLANT
GOWN STRL REUS W/TWL LRG LVL3 (GOWN DISPOSABLE) ×4 IMPLANT
HOLDER FOLEY CATH W/STRAP (MISCELLANEOUS) ×2 IMPLANT
IV NS IRRIG 3000ML ARTHROMATIC (IV SOLUTION) ×18 IMPLANT
KIT TURNOVER CYSTO (KITS) ×2 IMPLANT
LOOP CUT BIPOLAR 24F LRG (ELECTROSURGICAL) ×2 IMPLANT
MANIFOLD NEPTUNE II (INSTRUMENTS) ×2 IMPLANT
NS IRRIG 1000ML POUR BTL (IV SOLUTION) ×2 IMPLANT
PACK CYSTO (CUSTOM PROCEDURE TRAY) ×2 IMPLANT
SYR TOOMEY IRRIG 70ML (MISCELLANEOUS) ×2
SYRINGE TOOMEY IRRIG 70ML (MISCELLANEOUS) ×1 IMPLANT
TUBE CONNECTING 12X1/4 (SUCTIONS) ×2 IMPLANT
TUBING UROLOGY SET (TUBING) ×2 IMPLANT
WATER STERILE IRR 500ML POUR (IV SOLUTION) ×2 IMPLANT

## 2020-08-09 NOTE — Op Note (Signed)
Preoperative diagnosis: Bladder outlet obstruction secondary to BPH  Postoperative diagnosis:  Bladder outlet obstruction secondary to BPH Bladder lesion  Procedure:  Cystoscopy Transurethral resectionof the prostate Bladder biopsy with fulguration  Surgeon: Jacalyn Lefevre, M.D.  Anesthesia: General  Complications: None  EBL: Minimal  Findings: Normal anterior urethra Lateral lobe hypertrophy of prostate with median lobe Bilateral orthotopic ureteral orifices Mild trabeculation 3 areas of patchy erythema lateral to right ureteral orifice  Specimens: Bladder biopsy Prostate chips  Indication: Allen [name] is a patient with bladder outlet obstruction secondary to benign prostatic hyperplasia. After reviewing the management options for treatment, he elected to proceed with the above surgical procedure(s). We have discussed the potential benefits and risks of the procedure, side effects of the proposed treatment, the likelihood of the patient achieving the goals of the procedure, and any potential problems that might occur during the procedure or recuperation. Informed consent has been obtained.  Description of procedure:  The patient was taken to the operating room and general anesthesia was induced.  The patient was placed in the dorsal lithotomy position, prepped and draped in the usual sterile fashion, and preoperative antibiotics were administered. A preoperative time-out was performed.   Cystourethroscopy was performed.  The patient's urethra was examined demonstrated bilobar prostatic hypertrophy with a median lobe.   The bladder was then systematically examined in its entirety.  Just lateral to the right ureteral orifice was an area of patchy erythema in 3 separate areas.  Decision was made to proceed with cold cup bladder biopsy of this area followed by fulguration.  The biopsy specimens were sent to pathology.  Next attention turned to the TURP part of the  procedure.  The ureteral orifices were identified and marked so as to be avoided during the procedure.  The prostate adenoma was then resected utilizing loop cautery resection with the bipolar cutting loop.  The prostate adenoma from the bladder neck back to the verumontanum was resected beginning at the six o'clock position and then extended to include the right and left lobes of the prostate and anterior prostate. Care was taken not to resect distal to the verumontanum.   Hemostasis was then achieved with the cautery and the bladder was emptied and reinspected with no significant bleeding noted at the end of the procedure.    A 20 French three-way hematuria catheter was then placed into the bladder.  The patient appeared to tolerate the procedure well and without complications.  The patient was able to be awakened and transferred to the recovery unit in satisfactory condition.    Follow up: The patient will be observed in extended stay for the next several hours and possibly stay overnight.  Continuous bladder irrigation will be weaned.  Patient will continue Foley catheter for 2 days postoperatively.

## 2020-08-09 NOTE — Interval H&P Note (Signed)
History and Physical Interval Note:  08/09/2020 8:11 AM  Allen [name]  has presented today for surgery, with the diagnosis of [place].  The various methods of treatment have been discussed with the patient and family. After consideration of risks, benefits and other options for treatment, the patient has consented to  Procedure(s): TRANSURETHRAL RESECTION OF THE PROSTATE (TURP) (N/A) as a surgical intervention.  The patient's history has been reviewed, patient examined, no change in status, stable for surgery.  I have reviewed the patient's chart and labs.  Questions were answered to the patient's satisfaction.     Maikel Neisler D Thedora Rings

## 2020-08-09 NOTE — Anesthesia Postprocedure Evaluation (Signed)
Anesthesia Post Note  Patient: Allen [name]  Procedure(s) Performed: TRANSURETHRAL RESECTION OF THE PROSTATE (TURP), BLADDER BIOPSY AND FULGERATION (Prostate)     Patient location during evaluation: PACU Anesthesia Type: General Level of consciousness: awake and alert Pain management: pain level controlled Vital Signs Assessment: post-procedure vital signs reviewed and stable Respiratory status: spontaneous breathing, nonlabored ventilation, respiratory function stable and patient connected to nasal cannula oxygen Cardiovascular status: blood pressure returned to baseline and stable Postop Assessment: no apparent nausea or vomiting Anesthetic complications: no   No notable events documented.  Last Vitals:  Vitals:   08/09/20 1042 08/09/20 1430  BP: 135/78 133/80  Pulse: 64 74  Resp: 17 18  Temp: (!) 36.3 C   SpO2: 95% 94%    Last Pain:  Vitals:   08/09/20 1233  TempSrc:   PainSc: 4                  Barnet Glasgow

## 2020-08-09 NOTE — Anesthesia Procedure Notes (Signed)
Procedure Name: LMA Insertion Date/Time: 08/09/2020 8:31 AM Performed by: Maryella Shivers, CRNA Pre-anesthesia Checklist: Patient identified, Emergency Drugs available, Suction available and Patient being monitored Patient Re-evaluated:Patient Re-evaluated prior to induction Oxygen Delivery Method: Circle system utilized Preoxygenation: Pre-oxygenation with 100% oxygen Induction Type: IV induction Ventilation: Mask ventilation without difficulty LMA: LMA inserted LMA Size: 5.0 Number of attempts: 1 Airway Equipment and Method: Bite block Placement Confirmation: positive ETCO2 Tube secured with: Tape Dental Injury: Teeth and Oropharynx as per pre-operative assessment

## 2020-08-09 NOTE — Discharge Instructions (Addendum)

## 2020-08-09 NOTE — Transfer of Care (Signed)
Immediate Anesthesia Transfer of Care Note  Patient: Allen [name]  Procedure(s) Performed: TRANSURETHRAL RESECTION OF THE PROSTATE (TURP), BLADDER BIOPSY AND FULGERATION (Prostate)  Patient Location: PACU  Anesthesia Type:General  Level of Consciousness: awake, alert  and oriented  Airway & Oxygen Therapy: Patient Spontanous Breathing and Patient connected to face mask oxygen  Post-op Assessment: Report given to RN and Post -op Vital signs reviewed and stable  Post vital signs: Reviewed and stable  Last Vitals:  Vitals Value Taken Time  BP 132/90 08/09/20 0950  Temp    Pulse 77 08/09/20 0952  Resp 14 08/09/20 0952  SpO2 99 % 08/09/20 0952  Vitals shown include unvalidated device data.  Last Pain:  Vitals:   08/09/20 0736  TempSrc: Oral  PainSc: 0-No pain      Patients Stated Pain Goal: 5 ([id number] [phone])  Complications: No notable events documented.

## 2020-08-09 NOTE — Discharge Summary (Signed)
Date of admission: 08/09/2020  Date of discharge: 08/09/2020  Admission diagnosis: BPH with BOO  Discharge diagnosis: BPH with BOO, bladder lesion  Secondary diagnoses:  Patient Active Problem List   Diagnosis Date Noted   BPH with obstruction/lower urinary tract symptoms 08/09/2020   S/P deep brain stimulator placement 03/25/2020   Tremor 03/25/2020   Pulmonary nodule less than 6 mm in diameter with high risk for malignant neoplasm 03/09/2020   Injury of axilla 03/03/2020   Body mass index (BMI) 32.0-32.9, adult 02/22/2020   Cervical radiculopathy 02/22/2020   Elevated blood-pressure reading, without diagnosis of hypertension 02/22/2020   Neck pain 02/22/2020   Greater trochanteric bursitis of right hip 11/18/2019   Degenerative disc disease, cervical 07/01/2019   Degenerative disc disease, lumbar 07/01/2019   Nonallopathic lesion of lumbar region 07/01/2019   Varicose veins of both lower extremities 06/23/2019   Chronic diastolic CHF (congestive heart failure) ([place]) 06/02/2019   Perennial allergic rhinitis probable nonallergic component 06/01/2019   Cough, persistent 06/01/2019   Vitamin D deficiency 05/07/2019   Moderate persistent asthma without complication 35/00/9381   Allergic rhinitis 04/10/2019   Shortness of breath 04/10/2019   GERD (gastroesophageal reflux disease)    COPD (chronic obstructive pulmonary disease) ([facility])    OSA treated with BiPAP    Right carpal tunnel syndrome    Essential tremor    Insomnia    ED (erectile dysfunction)    Ulcerative colitis ([facility]) 06/30/2014    Procedures performed: Procedure(s): TRANSURETHRAL RESECTION OF THE PROSTATE (TURP), BLADDER BIOPSY AND FULGERATION  History and Physical: For full details, please see admission history and physical. Briefly, Allen [name] is a 70 y.o. year old patient with BPH with bladder outlet obstruction underwent TURP.   Hospital Course: Patient tolerated the procedure well.  He was then transferred  to the floor after an uneventful PACU stay.  His hospital course was uncomplicated.  On POD#0 he had met discharge criteria: was eating a regular diet, was up and ambulating independently,  pain was well controlled, urine was clear, and was ready to for discharge.   Laboratory values:  No results for input(s): WBC, HGB, HCT in the last 72 hours. No results for input(s): NA, K, CL, CO2, GLUCOSE, BUN, CREATININE, CALCIUM in the last 72 hours. No results for input(s): LABPT, INR in the last 72 hours. No results for input(s): LABURIN in the last 72 hours. Results for orders placed or performed during the hospital encounter of 01/21/20  SARS Coronavirus 2 by RT PCR (hospital order, performed in [facility] hospital lab) Nasopharyngeal Nasopharyngeal Swab     Status: None   Collection Time: 01/21/20  2:10 PM   Specimen: Nasopharyngeal Swab  Result Value Ref Range Status   SARS Coronavirus 2 NEGATIVE NEGATIVE Final    Comment: (NOTE) SARS-CoV-2 target nucleic acids are NOT DETECTED.  The SARS-CoV-2 RNA is generally detectable in upper and lower respiratory specimens during the acute phase of infection. The lowest concentration of SARS-CoV-2 viral copies this assay can detect is 250 copies / mL. A negative result does not preclude SARS-CoV-2 infection and should not be used as the sole basis for treatment or other patient management decisions.  A negative result may occur with improper specimen collection / handling, submission of specimen other than nasopharyngeal swab, presence of viral mutation(s) within the areas targeted by this assay, and inadequate number of viral copies (<250 copies / mL). A negative result must be combined with clinical observations, patient history, and epidemiological information.  Fact Sheet for Patients:   BoilerBrush.com.cy  Fact Sheet for Healthcare Providers: https://pope.com/  This test is not yet approved or   cleared by the [place] FDA and has been authorized for detection and/or diagnosis of SARS-CoV-2 by FDA under an Emergency Use Authorization (EUA).  This EUA will remain in effect (meaning this test can be used) for the duration of the COVID-19 declaration under Section 564(b)(1) of the Act, 21 U.S.C. section 360bbb-3(b)(1), unless the authorization is terminated or revoked sooner.  Performed at [facility], [address], [place], [place] [address]     Disposition: Home  Discharge instruction: The patient was instructed to be ambulatory but told to refrain from heavy lifting, strenuous activity, or driving.   Discharge medications:  Allergies as of 08/09/2020   No Known Allergies      Medication List     TAKE these medications    albuterol (2.5 MG/3ML) 0.083% nebulizer solution Commonly known as: PROVENTIL Take 3 mLs (2.5 mg total) by nebulization every 6 (six) hours.   cephALEXin 500 MG capsule Commonly known as: KEFLEX Take 1 capsule (500 mg total) by mouth once for 1 dose. Take the morning of catheter removal   escitalopram 20 MG tablet Commonly known as: LEXAPRO Take 20 mg by mouth daily.   folic acid 400 MCG tablet Commonly known as: FOLVITE Take 400 mcg by mouth daily.   HYDROcodone-acetaminophen 5-325 MG tablet Commonly known as: NORCO/VICODIN Take 1-2 tablets by mouth every 4 (four) hours as needed for moderate pain.   silodosin 8 MG Caps capsule Commonly known as: RAPAFLO Take 1 capsule (8 mg total) by mouth daily.   Trelegy Ellipta 200-62.5-25 MCG/INH Aepb Generic drug: Fluticasone-Umeclidin-Vilant Inhale 1 puff into the lungs daily.   vitamin C 1000 MG tablet Take 1,000 mg by mouth daily.   zolpidem 10 MG tablet Commonly known as: AMBIEN Take 10 mg by mouth at bedtime as needed for sleep.       ASK your doctor about these medications    albuterol 108 (90 Base) MCG/ACT inhaler Commonly known as: VENTOLIN HFA INHALE 2 PUFFS  INTO THE LUNGS EVERY 4 HOURS AS NEEDED FOR WHEEZE OR FOR SHORTNESS OF BREATH   azelastine 0.1 % nasal spray Commonly known as: ASTELIN Place 1-2 sprays into both nostrils 2 (two) times daily as needed for rhinitis.   benzonatate 100 MG capsule Commonly known as: TESSALON Take 1 capsule (100 mg total) by mouth 2 (two) times daily as needed for cough.   fluticasone 50 MCG/ACT nasal spray Commonly known as: Flonase Place 2 sprays into both nostrils daily as needed for allergies or rhinitis.   montelukast 10 MG tablet Commonly known as: SINGULAIR Take 1 tablet (10 mg total) by mouth at bedtime.   omeprazole 20 MG capsule Commonly known as: PRILOSEC TAKE 1 CAPSULE BY MOUTH EVERY DAY   primidone 50 MG tablet Commonly known as: MYSOLINE Take 1 tablet (50 mg total) by mouth 2 (two) times daily.   tadalafil 20 MG tablet Commonly known as: CIALIS Take 1 tablet (20 mg total) by mouth daily as needed for erectile dysfunction.   tiZANidine 2 MG tablet Commonly known as: ZANAFLEX TAKE 1 TABLET BY MOUTH EVERYDAY AT BEDTIME   traZODone 100 MG tablet Commonly known as: DESYREL Take 1 tablet (100 mg total) by mouth at bedtime.        Followup:   Follow-up Information     [facility] Follow up.  Contact information: [address] [place] [place] [phone]

## 2020-08-10 ENCOUNTER — Encounter (HOSPITAL_BASED_OUTPATIENT_CLINIC_OR_DEPARTMENT_OTHER): Payer: Self-pay | Admitting: Urology

## 2020-08-10 LAB — SURGICAL PATHOLOGY

## 2020-08-11 DIAGNOSIS — R3911 Hesitancy of micturition: Secondary | ICD-10-CM | POA: Diagnosis not present

## 2020-08-18 ENCOUNTER — Telehealth: Payer: Self-pay | Admitting: Pulmonary Disease

## 2020-08-18 ENCOUNTER — Encounter: Payer: Self-pay | Admitting: Internal Medicine

## 2020-08-18 NOTE — Telephone Encounter (Signed)
Patient returned call.  Dr. Juanetta Gosling recommendations.  Patient requested to keep current OV.Office does not have an OV opening before scheduled OV.  Advised Patient advised to contact PCP, Urgent  Care, or ED.  Understanding stated.  Advised Patient to call for any cancellations that could fit his scheduled.  Nothing further at this time.

## 2020-08-18 NOTE — Telephone Encounter (Signed)
He needs to be scheduled for an appointment sooner.  If unable to double book visit today or tomorrow, then he needs to go to an urgent care center or emergency room to be assessed sooner.  He can't wait until 09/01/20 if he is having these types of symptoms.  He could also try getting an appointment schedule with his PCP to determine if they can see him sooner.

## 2020-08-18 NOTE — Telephone Encounter (Signed)
Called patient's spouse back but she did not answer. Will attempt to call back again this afternoon.

## 2020-08-18 NOTE — Telephone Encounter (Signed)
Called and spoke with pt who states he has not been feeling right after he was diagnosed with Covid 7/4. Pt said that he has recovered after he had covid and has had a negative covid test since.  Pt said that he currently has a lot of pressure on his chest when he breathes and states when he coughs he does have chest discomfort and says that he has pain in his lungs when he coughs. Pt's cough is a dry cough.  Pt denies any complaints of wheezing and also denies any complaints of fever.  Pt does wear his O2 at night at 2L. States that his sats on room air during the day have been ranging between 95-96%.  Pt is using his trelegy inhaler daily. States that he has not had to use his albuterol any.  Pt wanted to have an appt scheduled so I have scheduled pt an appt with TP 8/18 which was the next avail but pt also wants to know if any recommendations could be provided by VS.  Dr. Halford Chessman, please advise.

## 2020-08-19 DIAGNOSIS — N3000 Acute cystitis without hematuria: Secondary | ICD-10-CM | POA: Diagnosis not present

## 2020-08-19 DIAGNOSIS — R300 Dysuria: Secondary | ICD-10-CM | POA: Diagnosis not present

## 2020-08-19 DIAGNOSIS — S86311D Strain of muscle(s) and tendon(s) of peroneal muscle group at lower leg level, right leg, subsequent encounter: Secondary | ICD-10-CM | POA: Diagnosis not present

## 2020-08-19 DIAGNOSIS — I5032 Chronic diastolic (congestive) heart failure: Secondary | ICD-10-CM | POA: Diagnosis not present

## 2020-08-19 DIAGNOSIS — J432 Centrilobular emphysema: Secondary | ICD-10-CM | POA: Diagnosis not present

## 2020-08-19 DIAGNOSIS — S86311A Strain of muscle(s) and tendon(s) of peroneal muscle group at lower leg level, right leg, initial encounter: Secondary | ICD-10-CM | POA: Diagnosis not present

## 2020-08-19 DIAGNOSIS — G4733 Obstructive sleep apnea (adult) (pediatric): Secondary | ICD-10-CM | POA: Diagnosis not present

## 2020-08-26 ENCOUNTER — Other Ambulatory Visit: Payer: Self-pay | Admitting: Internal Medicine

## 2020-09-01 ENCOUNTER — Encounter: Payer: Self-pay | Admitting: Adult Health

## 2020-09-01 ENCOUNTER — Ambulatory Visit (INDEPENDENT_AMBULATORY_CARE_PROVIDER_SITE_OTHER)
Admission: RE | Admit: 2020-09-01 | Discharge: 2020-09-01 | Disposition: A | Discharge status: Home / Self Care | Payer: Medicare HMO | Source: Ambulatory Visit | Attending: Adult Health | Admitting: Adult Health

## 2020-09-01 ENCOUNTER — Ambulatory Visit: Payer: Medicare HMO | Admitting: Adult Health

## 2020-09-01 ENCOUNTER — Other Ambulatory Visit: Payer: Self-pay

## 2020-09-01 VITALS — BP 118/78 | HR 65 | Temp 98.3°F | Ht 70.0 in | Wt 247.0 lb

## 2020-09-01 DIAGNOSIS — R059 Cough, unspecified: Secondary | ICD-10-CM | POA: Diagnosis not present

## 2020-09-01 DIAGNOSIS — J418 Mixed simple and mucopurulent chronic bronchitis: Secondary | ICD-10-CM | POA: Diagnosis not present

## 2020-09-01 DIAGNOSIS — R053 Chronic cough: Secondary | ICD-10-CM | POA: Diagnosis not present

## 2020-09-01 DIAGNOSIS — J449 Chronic obstructive pulmonary disease, unspecified: Secondary | ICD-10-CM | POA: Diagnosis not present

## 2020-09-01 DIAGNOSIS — J9611 Chronic respiratory failure with hypoxia: Secondary | ICD-10-CM | POA: Insufficient documentation

## 2020-09-01 MED ORDER — MONTELUKAST SODIUM 10 MG PO TABS
10.0000 mg | ORAL_TABLET | Freq: Every day | ORAL | 1 refills | Status: DC
Start: 2020-09-01 — End: 2020-12-28

## 2020-09-01 MED ORDER — PREDNISONE 20 MG PO TABS: 20.0000 mg | ORAL_TABLET | Freq: Every day | ORAL | 0 refills | Status: DC

## 2020-09-01 MED ORDER — TRELEGY ELLIPTA 200-62.5-25 MCG/INH IN AEPB: 1.0000 | INHALATION_SPRAY | Freq: Every day | RESPIRATORY_TRACT | 3 refills | Status: DC

## 2020-09-01 NOTE — Progress Notes (Signed)
$'@Patient'J$  ID: Allen [name], male    DOB: December 01, 1950, 70 y.o.   MRN: [id number]  Chief Complaint  Patient presents with   Follow-up    Referring provider: Isaac Bliss, [facility]*  HPI: 70 year old male former smoker followed for asthma, COPD with emphysema, lung nodule and sleep-related hypoxia on nocturnal oxygen at 2 L, OSA -BIPAP intolerant   TEST/EVENTS :  PFT 12/21/16 >> FEV1 2.40 (69%), FEV1% 74, TLC 4.95 (73%), DLCO 77%, +BD IgE 04/10/19 >> 66 PFT 04/20/19 >> FEV1 2.09 (62%), FEV1% 74, TLC 5.33 (75%), DLCO 93%   Chest Imaging:  CT chest 05/04/19 >> centrilobular and paraseptal emphysema, 3 mm nodule RUL, 4 mm nodule RML, 3 mm nodule lingula   Cardiac Tests:  Echo 05/29/19 >> EF 60 to 65%, grade 1 DD, mod LA dilation   09/01/2020 Follow up : COPD w/ Asthma/Emphysema  Patient returns for a follow-up visit.  Patient complains that he has had a lingering cough over the last 6 weeks.  He developed COVID-19 infection July 18, 2020.  Says he acute upper respiratory and symptoms for about a week.  Seem to improve and resolve without requiring any type of treatment.  However he has had a lingering dry cough since then.  Has some intermittent episodes where he feels his breathing is not quite as good.  No significant wheezing.  He is had no fever or discolored mucus.  No hemoptysis chest pain orthopnea PND or increased leg swelling.  Has not tried any over-the-counter medications.  He remains on Trelegy inhaler daily.  He has chronic allergies is on Singulair, Flonase and Astelin.  Takes Prilosec for GERD Has been fully vaccinated for COVID-19 Remains on oxygen 2 L at bedtime.  No Known Allergies  Immunization History  Administered Date(s) Administered   Fluad Quad(high Dose 65+) 09/18/2018, 11/10/2019   PFIZER(Purple Top)SARS-COV-2 Vaccination 02/20/2019, 03/17/2019, 06/02/2019, 10/03/2019   Pneumococcal Polysaccharide-23 01/08/2015   Pneumococcal-Unspecified 12/15/2017   Tdap  09/17/2016   Zoster Recombinat (Shingrix) 06/06/2019, 09/05/2019    Past Medical History:  Diagnosis Date   Anxiety    Arthritis    neck -   BPH with obstruction/lower urinary tract symptoms    urologist-- dr pace   Centrilobular emphysema [facility])    followed by dr Halford Chessman   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[facility])    followed by dr Halford Chessman   Cough 07/2020   covid residual, no congestion   DOE (dyspnea on exertion)    due to copd/ emphysema   ED (erectile dysfunction)    Essential tremor    neurologist--- dr tat----  bilateral upper extremities,  s/p DPS 12/ 2016   GERD (gastroesophageal reflux disease)    History of 2019 novel coronavirus disease (COVID-19) 07/18/2020   per pt postive home test 07-18-2020 w/ mild  symptoms per pt; pt stated had urgent care visit 07-24-2020 for sob/ cough with negative cxr (in care everywhere) ;  follow up pcp visit 08-03-2020 in epic   Insomnia    Mixed simple and mucopurulent chronic bronchitis (HCC)    OSA (obstructive sleep apnea)    followed by dr Halford Chessman---- no longer does uses bipap, intolerant --- pt prescriped  Oxygen  2 L oxygen via [place] while sleeping  (study in epic 04/ 2017 [facility])   Perennial allergic rhinitis    Pulmonary nodules    followed by dr Halford Chessman   S/P deep brain stimulator placement 12/2014   followed by neurology-----IPG change 01-21-2020 for end-of life battery  (  pt has a control)   Varicose vein of leg    per pt has had 3 procedure's last one 2006   Wears dentures    upper    Tobacco History: Social History   Tobacco Use  Smoking Status Former   Packs/day: 3.00   Years: 20.00   Pack years: 60.00   Types: Cigarettes   Quit date: 1991   Years since quitting: 31.6  Smokeless Tobacco Never   Counseling given: Not Answered   Outpatient Medications Prior to Visit  Medication Sig Dispense Refill   albuterol (PROVENTIL) (2.5 MG/3ML) 0.083% nebulizer solution Take 3 mLs (2.5 mg total) by nebulization every 6 (six) hours. 150 mL 3    albuterol (VENTOLIN HFA) 108 (90 Base) MCG/ACT inhaler INHALE 2 PUFFS INTO THE LUNGS EVERY 4 HOURS AS NEEDED FOR WHEEZE OR FOR SHORTNESS OF BREATH (Patient taking differently: Inhale 1 puff into the lungs every 4 (four) hours as needed. INHALE 2 PUFFS INTO THE LUNGS EVERY 4 HOURS AS NEEDED FOR WHEEZE OR FOR SHORTNESS OF BREATH) 8.5 each 0   Ascorbic Acid (VITAMIN C) 1000 MG tablet Take 1,000 mg by mouth daily.     azelastine (ASTELIN) 0.1 % nasal spray Place 1-2 sprays into both nostrils 2 (two) times daily as needed for rhinitis. 30 mL 5   benzonatate (TESSALON) 100 MG capsule Take 1 capsule (100 mg total) by mouth 2 (two) times daily as needed for cough. 20 capsule 0   escitalopram (LEXAPRO) 20 MG tablet Take 20 mg by mouth daily.     fluticasone (FLONASE) 50 MCG/ACT nasal spray Place 2 sprays into both nostrils daily as needed for allergies or rhinitis. 16 g 5   folic acid (FOLVITE) A999333 MCG tablet Take 400 mcg by mouth daily.     HYDROcodone-acetaminophen (NORCO/VICODIN) 5-325 MG tablet Take 1-2 tablets by mouth every 4 (four) hours as needed for moderate pain. 30 tablet 0   omeprazole (PRILOSEC) 20 MG capsule TAKE 1 CAPSULE BY MOUTH EVERY DAY (Patient taking differently: Take 20 mg by mouth daily.) 90 capsule 0   primidone (MYSOLINE) 50 MG tablet Take 1 tablet (50 mg total) by mouth 2 (two) times daily. 180 tablet 1   silodosin (RAPAFLO) 8 MG CAPS capsule Take 1 capsule (8 mg total) by mouth daily. 30 capsule 1   tadalafil (CIALIS) 20 MG tablet Take 1 tablet (20 mg total) by mouth daily as needed for erectile dysfunction. (Patient taking differently: Take 10 mg by mouth daily as needed.) 90 tablet 3   tiZANidine (ZANAFLEX) 2 MG tablet TAKE 1 TABLET BY MOUTH EVERYDAY AT BEDTIME (Patient taking differently: Take 2 mg by mouth at bedtime as needed. TAKE 1 TABLET BY MOUTH EVERYDAY AT BEDTIME) 90 tablet 1   traZODone (DESYREL) 100 MG tablet Take 1 tablet (100 mg total) by mouth at bedtime. 90 tablet 1    zolpidem (AMBIEN) 10 MG tablet Take 10 mg by mouth at bedtime as needed for sleep.     Fluticasone-Umeclidin-Vilant (TRELEGY ELLIPTA) 200-62.5-25 MCG/INH AEPB Inhale 1 puff into the lungs daily. 3 each 3   montelukast (SINGULAIR) 10 MG tablet Take 1 tablet (10 mg total) by mouth at bedtime. 90 tablet 1   No facility-administered medications prior to visit.     Review of Systems:   Constitutional:   No  weight loss, night sweats,  Fevers, chills,  +fatigue, or  lassitude.  HEENT:   No headaches,  Difficulty swallowing,  Tooth/dental problems, or  Sore  throat,                No sneezing, itching, ear ache,  +nasal congestion, post nasal drip,   CV:  No chest pain,  Orthopnea, PND, swelling in lower extremities, anasarca, dizziness, palpitations, syncope.   GI  No heartburn, indigestion, abdominal pain, nausea, vomiting, diarrhea, change in bowel habits, loss of appetite, bloody stools.   Resp: .  No chest wall deformity  Skin: no rash or lesions.  GU: no dysuria, change in color of urine, no urgency or frequency.  No flank pain, no hematuria   MS:  No joint pain or swelling.  No decreased range of motion.  No back pain.    Physical Exam  BP 118/78 (BP Location: Left Arm, Patient Position: Sitting, Cuff Size: Normal)   Pulse 65   Temp 98.3 F (36.8 C) (Oral)   Ht '5\' 10"'$  (1.778 m)   Wt 247 lb (112 kg)   SpO2 94%   BMI 35.44 kg/m   GEN: A/Ox3; pleasant , NAD, well nourished    HEENT:  [place]/AT,   NOSE-clear, THROAT-clear, no lesions, no postnasal drip or exudate noted.   NECK:  Supple w/ fair ROM; no JVD; normal carotid impulses w/o bruits; no thyromegaly or nodules palpated; no lymphadenopathy.    RESP  Clear  P & A; w/o, wheezes/ rales/ or rhonchi. no accessory muscle use, no dullness to percussion  CARD:  RRR, no m/r/g, no peripheral edema, pulses intact, no cyanosis or clubbing.  GI:   Soft & nt; nml bowel sounds; no organomegaly or masses detected.   Musco: Warm  bil, no deformities or joint swelling noted.   Neuro: alert, no focal deficits noted.    Skin: Warm, no lesions or rashes    Lab Results:    BNP No results found for: BNP    Imaging: DG Chest 2 View  Result Date: 09/01/2020 CLINICAL DATA:  cough, copd, covid infection 1 month ago EXAM: CHEST - 2 VIEW COMPARISON:  04/10/2019, 05/11/2020 FINDINGS: The heart size and mediastinal contours are within normal limits. Both lungs are clear. The visualized skeletal structures are unremarkable. Neural stimulator pack overlies the left chest wall with lead extending superiorly beyond the field of view. IMPRESSION: No active cardiopulmonary disease. Electronically Signed   By: Davina Poke D.O.   On: 09/01/2020 13:07      PFT Results Latest Ref Rng & Units 04/20/2019  FVC-Pre L 2.82  FVC-Predicted Pre % 62  FVC-Post L 2.66  FVC-Predicted Post % 59  Pre FEV1/FVC % % 74  Post FEV1/FCV % % 76  FEV1-Pre L 2.09  FEV1-Predicted Pre % 62  FEV1-Post L 2.01  DLCO uncorrected ml/min/mmHg 24.64  DLCO UNC% % 93  DLCO corrected ml/min/mmHg 25.89  DLCO COR %Predicted % 98  DLVA Predicted % 144  TLC L 5.33  TLC % Predicted % 75  RV % Predicted % 94    No results found for: NITRICOXIDE      Assessment & Plan:   COPD (chronic obstructive pulmonary disease) (HCC) COPD with asthma.  Mild exacerbation since COVID-19.  With an ongoing post viral cough.  Check chest x-ray today.  Short course of prednisone.  Cough control regimen  Plan  Patient Instructions  Chest xray today. Prednisone '20mg'$  daily for 5 days. Take with food.  Albuterol inhaler /neb every 6hr as needed  TRELEGY 1 puff daily , rinse after use  Mucinex DM Twice daily  As needed  cough/congestion  Tessalon Three times a day  As needed  cough  Continue on Oxygen 2l/m At bedtime   Follow up with Dr. Halford Chessman  in 3 months and As needed   Please contact office for sooner follow up if symptoms do not improve or worsen or seek  emergency care       Cough, persistent Suspect is a post viral cough.  Continue on current regimen.  Add in Mucinex DM and Tessalon. Trigger prevention.  Plan  Patient Instructions  Chest xray today. Prednisone '20mg'$  daily for 5 days. Take with food.  Albuterol inhaler /neb every 6hr as needed  TRELEGY 1 puff daily , rinse after use  Mucinex DM Twice daily  As needed  cough/congestion  Tessalon Three times a day  As needed  cough  Continue on Oxygen 2l/m At bedtime   Follow up with Dr. Halford Chessman  in 3 months and As needed   Please contact office for sooner follow up if symptoms do not improve or worsen or seek emergency care       Chronic respiratory failure with hypoxia ([place]) Continue on oxygen 2 L at bedtime     Rexene Edison, NP 09/01/2020

## 2020-09-01 NOTE — Assessment & Plan Note (Signed)
Suspect is a post viral cough.  Continue on current regimen.  Add in Mucinex DM and Tessalon. Trigger prevention.  Plan  Patient Instructions  Chest xray today. Prednisone '20mg'$  daily for 5 days. Take with food.  Albuterol inhaler /neb every 6hr as needed  TRELEGY 1 puff daily , rinse after use  Mucinex DM Twice daily  As needed  cough/congestion  Tessalon Three times a day  As needed  cough  Continue on Oxygen 2l/m At bedtime   Follow up with Dr. Halford Chessman  in 3 months and As needed   Please contact office for sooner follow up if symptoms do not improve or worsen or seek emergency care

## 2020-09-01 NOTE — Patient Instructions (Signed)
Chest xray today. Prednisone '20mg'$  daily for 5 days. Take with food.  Albuterol inhaler /neb every 6hr as needed  TRELEGY 1 puff daily , rinse after use  Mucinex DM Twice daily  As needed  cough/congestion  Tessalon Three times a day  As needed  cough  Continue on Oxygen 2l/m At bedtime   Follow up with Dr. Halford Chessman  in 3 months and As needed   Please contact office for sooner follow up if symptoms do not improve or worsen or seek emergency care

## 2020-09-01 NOTE — Assessment & Plan Note (Signed)
COPD with asthma.  Mild exacerbation since COVID-19.  With an ongoing post viral cough.  Check chest x-ray today.  Short course of prednisone.  Cough control regimen  Plan  Patient Instructions  Chest xray today. Prednisone '20mg'$  daily for 5 days. Take with food.  Albuterol inhaler /neb every 6hr as needed  TRELEGY 1 puff daily , rinse after use  Mucinex DM Twice daily  As needed  cough/congestion  Tessalon Three times a day  As needed  cough  Continue on Oxygen 2l/m At bedtime   Follow up with Dr. Halford Chessman  in 3 months and As needed   Please contact office for sooner follow up if symptoms do not improve or worsen or seek emergency care

## 2020-09-01 NOTE — Assessment & Plan Note (Signed)
Continue on oxygen 2 L at bedtime 

## 2020-09-01 NOTE — Progress Notes (Signed)
Reviewed and agree with assessment/plan.   Chesley Mires, MD [facility] Pulmonary/Critical Care 09/01/2020, 1:28 PM Pager:  [phone]

## 2020-09-03 DIAGNOSIS — F410 Panic disorder [episodic paroxysmal anxiety] without agoraphobia: Secondary | ICD-10-CM | POA: Diagnosis not present

## 2020-09-03 DIAGNOSIS — R69 Illness, unspecified: Secondary | ICD-10-CM | POA: Diagnosis not present

## 2020-09-08 DIAGNOSIS — R8271 Bacteriuria: Secondary | ICD-10-CM | POA: Diagnosis not present

## 2020-09-08 DIAGNOSIS — R300 Dysuria: Secondary | ICD-10-CM | POA: Diagnosis not present

## 2020-09-12 DIAGNOSIS — L2389 Allergic contact dermatitis due to other agents: Secondary | ICD-10-CM | POA: Diagnosis not present

## 2020-09-12 DIAGNOSIS — L239 Allergic contact dermatitis, unspecified cause: Secondary | ICD-10-CM | POA: Diagnosis not present

## 2020-09-13 ENCOUNTER — Ambulatory Visit: Payer: Medicare HMO | Admitting: Podiatry

## 2020-09-16 ENCOUNTER — Other Ambulatory Visit: Payer: Self-pay

## 2020-09-16 NOTE — Progress Notes (Signed)
Order(s) created erroneously. Erroneous order ID: [id number]  Order moved by: Emerson Monte  Order move date/time: 09/16/2020 4:21 PM  Source Patient: [id number]  Source Contact: 09/09/2026  Destination Patient: [id number]  Destination Contact: 06/12/2018

## 2020-09-16 NOTE — Progress Notes (Signed)
Order(s) created erroneously. Erroneous order ID: [id number]  Order moved by: Emerson Monte  Order move date/time: 09/16/2020 4:12 PM  Source Patient: [id number]  Source Contact: 09/09/2026  Destination Patient: [id number]  Destination Contact: 06/12/2018

## 2020-09-19 DIAGNOSIS — G4733 Obstructive sleep apnea (adult) (pediatric): Secondary | ICD-10-CM | POA: Diagnosis not present

## 2020-09-19 DIAGNOSIS — S86311A Strain of muscle(s) and tendon(s) of peroneal muscle group at lower leg level, right leg, initial encounter: Secondary | ICD-10-CM | POA: Diagnosis not present

## 2020-09-19 DIAGNOSIS — S86311D Strain of muscle(s) and tendon(s) of peroneal muscle group at lower leg level, right leg, subsequent encounter: Secondary | ICD-10-CM | POA: Diagnosis not present

## 2020-09-19 DIAGNOSIS — J432 Centrilobular emphysema: Secondary | ICD-10-CM | POA: Diagnosis not present

## 2020-09-19 DIAGNOSIS — I5032 Chronic diastolic (congestive) heart failure: Secondary | ICD-10-CM | POA: Diagnosis not present

## 2020-09-21 ENCOUNTER — Other Ambulatory Visit: Payer: Self-pay

## 2020-09-21 DIAGNOSIS — J418 Mixed simple and mucopurulent chronic bronchitis: Secondary | ICD-10-CM

## 2020-09-21 MED ORDER — ALBUTEROL SULFATE HFA 108 (90 BASE) MCG/ACT IN AERS: INHALATION_SPRAY | RESPIRATORY_TRACT | 0 refills | Status: DC

## 2020-09-27 ENCOUNTER — Ambulatory Visit: Payer: Medicare HMO | Admitting: Podiatry

## 2020-09-27 ENCOUNTER — Other Ambulatory Visit: Payer: Self-pay | Admitting: Internal Medicine

## 2020-09-27 DIAGNOSIS — Z1283 Encounter for screening for malignant neoplasm of skin: Secondary | ICD-10-CM

## 2020-10-01 DIAGNOSIS — F411 Generalized anxiety disorder: Secondary | ICD-10-CM | POA: Diagnosis not present

## 2020-10-01 DIAGNOSIS — F331 Major depressive disorder, recurrent, moderate: Secondary | ICD-10-CM | POA: Diagnosis not present

## 2020-10-01 DIAGNOSIS — F4322 Adjustment disorder with anxiety: Secondary | ICD-10-CM | POA: Diagnosis not present

## 2020-10-01 DIAGNOSIS — R69 Illness, unspecified: Secondary | ICD-10-CM | POA: Diagnosis not present

## 2020-10-19 ENCOUNTER — Encounter: Payer: Self-pay | Admitting: Internal Medicine

## 2020-10-19 DIAGNOSIS — S86311A Strain of muscle(s) and tendon(s) of peroneal muscle group at lower leg level, right leg, initial encounter: Secondary | ICD-10-CM | POA: Diagnosis not present

## 2020-10-19 DIAGNOSIS — S86311D Strain of muscle(s) and tendon(s) of peroneal muscle group at lower leg level, right leg, subsequent encounter: Secondary | ICD-10-CM | POA: Diagnosis not present

## 2020-10-19 DIAGNOSIS — I5032 Chronic diastolic (congestive) heart failure: Secondary | ICD-10-CM | POA: Diagnosis not present

## 2020-10-19 DIAGNOSIS — G4733 Obstructive sleep apnea (adult) (pediatric): Secondary | ICD-10-CM | POA: Diagnosis not present

## 2020-10-19 DIAGNOSIS — J432 Centrilobular emphysema: Secondary | ICD-10-CM | POA: Diagnosis not present

## 2020-10-22 ENCOUNTER — Other Ambulatory Visit: Payer: Self-pay | Admitting: Allergy & Immunology

## 2020-10-25 DIAGNOSIS — R972 Elevated prostate specific antigen [PSA]: Secondary | ICD-10-CM | POA: Diagnosis not present

## 2020-10-25 LAB — PSA: PSA: 30.7

## 2020-11-02 DIAGNOSIS — R69 Illness, unspecified: Secondary | ICD-10-CM | POA: Diagnosis not present

## 2020-11-02 DIAGNOSIS — F410 Panic disorder [episodic paroxysmal anxiety] without agoraphobia: Secondary | ICD-10-CM | POA: Diagnosis not present

## 2020-11-13 ENCOUNTER — Encounter: Payer: Self-pay | Admitting: Internal Medicine

## 2020-11-14 ENCOUNTER — Other Ambulatory Visit: Payer: Self-pay

## 2020-11-15 ENCOUNTER — Ambulatory Visit (INDEPENDENT_AMBULATORY_CARE_PROVIDER_SITE_OTHER): Payer: Medicare HMO | Admitting: Internal Medicine

## 2020-11-15 ENCOUNTER — Encounter: Payer: Self-pay | Admitting: Internal Medicine

## 2020-11-15 VITALS — BP 110/80 | HR 82 | Temp 98.7°F | Wt 242.7 lb

## 2020-11-15 DIAGNOSIS — K219 Gastro-esophageal reflux disease without esophagitis: Secondary | ICD-10-CM | POA: Diagnosis not present

## 2020-11-15 MED ORDER — PANTOPRAZOLE SODIUM 40 MG PO TBEC
40.0000 mg | DELAYED_RELEASE_TABLET | Freq: Every day | ORAL | 1 refills | Status: DC
Start: 2020-11-15 — End: 2020-12-20

## 2020-11-15 NOTE — Progress Notes (Signed)
Established Patient Office Visit     This visit occurred during the SARS-CoV-2 public health emergency.  Safety protocols were in place, including screening questions prior to the visit, additional usage of staff PPE, and extensive cleaning of exam room while observing appropriate contact time as indicated for disinfecting solutions.    CC/Reason for Visit: Discuss burning epigastric pain  HPI: Allen [name] is a 70 y.o. male who is coming in today for the above mentioned reasons.  For the past 3 weeks he has noted significant burning pain in the center of his upper abdomen.  This is very prominent after eating and especially at bedtime.  He has not noted relationship to type of food.    Past Medical/Surgical History: Past Medical History:  Diagnosis Date   Anxiety    Arthritis    neck -   BPH with obstruction/lower urinary tract symptoms    urologist-- dr pace   Centrilobular emphysema [facility])    followed by dr Halford Chessman   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[facility])    followed by dr Halford Chessman   Cough 07/2020   covid residual, no congestion   DOE (dyspnea on exertion)    due to copd/ emphysema   ED (erectile dysfunction)    Essential tremor    neurologist--- dr tat----  bilateral upper extremities,  s/p DPS 12/ 2016   GERD (gastroesophageal reflux disease)    History of 2019 novel coronavirus disease (COVID-19) 07/18/2020   per pt postive home test 07-18-2020 w/ mild  symptoms per pt; pt stated had urgent care visit 07-24-2020 for sob/ cough with negative cxr (in care everywhere) ;  follow up pcp visit 08-03-2020 in epic   Insomnia    Mixed simple and mucopurulent chronic bronchitis (HCC)    OSA (obstructive sleep apnea)    followed by dr Halford Chessman---- no longer does uses bipap, intolerant --- pt prescriped  Oxygen  2 L oxygen via  while sleeping  (study in epic 04/ 2017 [facility])   Perennial allergic rhinitis    Pulmonary nodules    followed by dr Halford Chessman   S/P deep brain stimulator placement 12/2014    followed by neurology-----IPG change 01-21-2020 for end-of life battery  (pt has a control)   Varicose vein of leg    per pt has had 3 procedure's last one 2006   Wears dentures    upper    Past Surgical History:  Procedure Laterality Date   ANKLE SURGERY Right 2020   tendon repair   CARPAL TUNNEL RELEASE Left 2019   CATARACT EXTRACTION W/ INTRAOCULAR LENS IMPLANT Bilateral 2020   COLONOSCOPY     DEEP BRAIN STIMULATOR PLACEMENT Left 2016   INGUINAL HERNIA REPAIR Right 2007   ROTATOR CUFF REPAIR Right 2014   SUBTHALAMIC STIMULATOR BATTERY REPLACEMENT N/A 01/21/2020   Procedure: Deep brain stimulator battery replacement;  Surgeon: Erline Levine, MD;  Location: [place] [facility];  Service: Neurosurgery;  Laterality: N/A;   TONSILLECTOMY AND ADENOIDECTOMY     age 72   TOTAL SHOULDER REPLACEMENT Left 2016   TRANSURETHRAL RESECTION OF PROSTATE N/A 08/09/2020   Procedure: TRANSURETHRAL RESECTION OF THE PROSTATE (TURP), BLADDER BIOPSY AND FULGERATION;  Surgeon: Robley Fries, MD;  Location: [place];  Service: Urology;  Laterality: N/A;   VEIN SURGERY Left 2006   varicose veins    Social History:  reports that he quit smoking about 31 years ago. His smoking use included cigarettes. He has a 60.00 pack-year smoking  history. He has never used smokeless tobacco. He reports that he does not currently use alcohol. He reports that he does not use drugs.  Allergies: No Known Allergies  Family History:  Family History  Problem Relation Age of Onset   COPD Mother    Asthma Father    Rectal cancer Sister    Healthy Child    Urticaria Neg Hx    Immunodeficiency Neg Hx    Eczema Neg Hx    Atopy Neg Hx    Angioedema Neg Hx    Allergic rhinitis Neg Hx      Current Outpatient Medications:    albuterol (PROVENTIL) (2.5 MG/3ML) 0.083% nebulizer solution, Take 3 mLs (2.5 mg total) by nebulization every 6 (six) hours., Disp: 150 mL, Rfl: 3   albuterol (VENTOLIN HFA) 108 (90 Base)  MCG/ACT inhaler, INHALE 2 PUFFS INTO THE LUNGS EVERY 4 HOURS AS NEEDED FOR WHEEZE OR FOR SHORTNESS OF BREATH, Disp: 8.5 each, Rfl: 0   Ascorbic Acid (VITAMIN C) 1000 MG tablet, Take 1,000 mg by mouth daily., Disp: , Rfl:    azelastine (ASTELIN) 0.1 % nasal spray, Place 1-2 sprays into both nostrils 2 (two) times daily as needed for rhinitis., Disp: 30 mL, Rfl: 5   benzonatate (TESSALON) 100 MG capsule, Take 1 capsule (100 mg total) by mouth 2 (two) times daily as needed for cough., Disp: 20 capsule, Rfl: 0   escitalopram (LEXAPRO) 20 MG tablet, Take 20 mg by mouth daily., Disp: , Rfl:    fluticasone (FLONASE) 50 MCG/ACT nasal spray, Place 2 sprays into both nostrils daily as needed for allergies or rhinitis., Disp: 16 g, Rfl: 5   Fluticasone-Umeclidin-Vilant (TRELEGY ELLIPTA) 200-62.5-25 MCG/INH AEPB, Inhale 1 puff into the lungs daily., Disp: 3 each, Rfl: 3   folic acid (FOLVITE) 270 MCG tablet, Take 400 mcg by mouth daily., Disp: , Rfl:    HYDROcodone-acetaminophen (NORCO/VICODIN) 5-325 MG tablet, Take 1-2 tablets by mouth every 4 (four) hours as needed for moderate pain., Disp: 30 tablet, Rfl: 0   montelukast (SINGULAIR) 10 MG tablet, Take 1 tablet (10 mg total) by mouth at bedtime., Disp: 90 tablet, Rfl: 1   pantoprazole (PROTONIX) 40 MG tablet, Take 1 tablet (40 mg total) by mouth daily., Disp: 90 tablet, Rfl: 1   predniSONE (DELTASONE) 20 MG tablet, Take 1 tablet (20 mg total) by mouth daily with breakfast., Disp: 5 tablet, Rfl: 0   primidone (MYSOLINE) 50 MG tablet, Take 1 tablet (50 mg total) by mouth 2 (two) times daily., Disp: 180 tablet, Rfl: 1   silodosin (RAPAFLO) 8 MG CAPS capsule, Take 1 capsule (8 mg total) by mouth daily., Disp: 30 capsule, Rfl: 1   tadalafil (CIALIS) 20 MG tablet, Take 1 tablet (20 mg total) by mouth daily as needed for erectile dysfunction. (Patient taking differently: Take 10 mg by mouth daily as needed.), Disp: 90 tablet, Rfl: 3   tiZANidine (ZANAFLEX) 2 MG  tablet, TAKE 1 TABLET BY MOUTH EVERYDAY AT BEDTIME (Patient taking differently: Take 2 mg by mouth at bedtime as needed. TAKE 1 TABLET BY MOUTH EVERYDAY AT BEDTIME), Disp: 90 tablet, Rfl: 1   traZODone (DESYREL) 100 MG tablet, Take 1 tablet (100 mg total) by mouth at bedtime., Disp: 90 tablet, Rfl: 1   zolpidem (AMBIEN) 10 MG tablet, Take 10 mg by mouth at bedtime as needed for sleep., Disp: , Rfl:    hydrOXYzine (VISTARIL) 25 MG capsule, Take 25 mg by mouth 2 (two) times daily., Disp: ,  Rfl:   Review of Systems:  Constitutional: Denies fever, chills, diaphoresis, appetite change and fatigue.  HEENT: Denies photophobia, eye pain, redness, hearing loss, ear pain, congestion, sore throat, rhinorrhea, sneezing, mouth sores, trouble swallowing, neck pain, neck stiffness and tinnitus.   Respiratory: Denies SOB, DOE, cough, chest tightness,  and wheezing.   Cardiovascular: Denies chest pain, palpitations and leg swelling.  Gastrointestinal: Denies  diarrhea, constipation, blood in stool and abdominal distention.  Genitourinary: Denies dysuria, urgency, frequency, hematuria, flank pain and difficulty urinating.  Endocrine: Denies: hot or cold intolerance, sweats, changes in hair or nails, polyuria, polydipsia. Musculoskeletal: Denies myalgias, back pain, joint swelling, arthralgias and gait problem.  Skin: Denies pallor, rash and wound.  Neurological: Denies dizziness, seizures, syncope, weakness, light-headedness, numbness and headaches.  Hematological: Denies adenopathy. Easy bruising, personal or family bleeding history  Psychiatric/Behavioral: Denies suicidal ideation, mood changes, confusion, nervousness, sleep disturbance and agitation    Physical Exam: Vitals:   11/15/20 1301  BP: 110/80  Pulse: 82  Temp: 98.7 F (37.1 C)  TempSrc: Oral  SpO2: 96%  Weight: 242 lb 11.2 oz (110.1 kg)    Body mass index is 34.82 kg/m.   Constitutional: NAD, calm, comfortable Eyes: PERRL, lids and  conjunctivae normal ENMT: Mucous membranes are moist.  Psychiatric: Normal judgment and insight. Alert and oriented x 3. Normal mood.    Impression and Plan:  Gastroesophageal reflux disease, unspecified whether esophagitis present  - Plan: pantoprazole (PROTONIX) 40 MG tablet -Based on description sounds like acid reflux, we will do an empiric trial of daily Protonix, he knows to notify me if symptoms do not improve after a few weeks.     Lelon Frohlich, MD [facility]

## 2020-11-16 ENCOUNTER — Other Ambulatory Visit: Payer: Self-pay | Admitting: Allergy & Immunology

## 2020-11-16 DIAGNOSIS — J418 Mixed simple and mucopurulent chronic bronchitis: Secondary | ICD-10-CM

## 2020-11-19 DIAGNOSIS — G4733 Obstructive sleep apnea (adult) (pediatric): Secondary | ICD-10-CM | POA: Diagnosis not present

## 2020-11-19 DIAGNOSIS — S86311D Strain of muscle(s) and tendon(s) of peroneal muscle group at lower leg level, right leg, subsequent encounter: Secondary | ICD-10-CM | POA: Diagnosis not present

## 2020-11-19 DIAGNOSIS — J432 Centrilobular emphysema: Secondary | ICD-10-CM | POA: Diagnosis not present

## 2020-11-19 DIAGNOSIS — I5032 Chronic diastolic (congestive) heart failure: Secondary | ICD-10-CM | POA: Diagnosis not present

## 2020-11-19 DIAGNOSIS — S86311A Strain of muscle(s) and tendon(s) of peroneal muscle group at lower leg level, right leg, initial encounter: Secondary | ICD-10-CM | POA: Diagnosis not present

## 2020-11-23 ENCOUNTER — Telehealth: Payer: Self-pay | Admitting: Adult Health

## 2020-11-23 MED ORDER — TRELEGY ELLIPTA 200-62.5-25 MCG/ACT IN AEPB: 1.0000 | INHALATION_SPRAY | Freq: Every day | RESPIRATORY_TRACT | 4 refills | Status: DC

## 2020-11-23 NOTE — Telephone Encounter (Signed)
Call returned to pharmacy, confirmed DOB. They need a new script for Trelegy. Refill sent.   Nothing further needed at this time.

## 2020-11-25 ENCOUNTER — Encounter: Payer: Self-pay | Admitting: Podiatry

## 2020-11-25 ENCOUNTER — Other Ambulatory Visit: Payer: Self-pay

## 2020-11-25 ENCOUNTER — Ambulatory Visit: Payer: Medicare HMO | Admitting: Podiatry

## 2020-11-25 DIAGNOSIS — M79675 Pain in left toe(s): Secondary | ICD-10-CM | POA: Diagnosis not present

## 2020-11-25 DIAGNOSIS — M79674 Pain in right toe(s): Secondary | ICD-10-CM | POA: Diagnosis not present

## 2020-11-25 DIAGNOSIS — B351 Tinea unguium: Secondary | ICD-10-CM

## 2020-11-25 NOTE — Progress Notes (Signed)
This patient returns to the office for evaluation and treatment of long thick painful nails .  This patient is unable to trim his own nails since the patient cannot reach his feet.  Patient says the nails are painful walking and wearing his shoes.  He returns for preventive foot care services. Patient had surgery by Dr.  March Rummage.  General Appearance  Alert, conversant and in no acute stress.  Vascular  Dorsalis pedis and posterior tibial  pulses are palpable  bilaterally.  Capillary return is within normal limits  bilaterally. Temperature is within normal limits  bilaterally.  Neurologic  Senn-Weinstein monofilament wire test within normal limits  bilaterally. Muscle power within normal limits bilaterally.  Nails Thick disfigured discolored nails with subungual debris  from hallux to fifth toes bilaterally.  Hallux nails are especially thickened and split and painful   No evidence of bacterial infection or drainage bilaterally.  Orthopedic  No limitations of motion  feet .  No crepitus or effusions noted.  No bony pathology or digital deformities noted.  Skin  normotropic skin with no porokeratosis noted bilaterally.  No signs of infections or ulcers noted.     Onychomycosis  Pain in toes right foot  Pain in toes left foot  Debridement  of nails  1-5  B/L with a nail nipper.  Nails were then filed using a dremel tool with no incidents.    RTC 3 months    Gardiner Barefoot DPM

## 2020-11-29 ENCOUNTER — Other Ambulatory Visit: Payer: Self-pay | Admitting: *Deleted

## 2020-12-05 ENCOUNTER — Other Ambulatory Visit: Payer: Self-pay

## 2020-12-05 MED ORDER — TRELEGY ELLIPTA 200-62.5-25 MCG/ACT IN AEPB: 1.0000 | INHALATION_SPRAY | Freq: Every day | RESPIRATORY_TRACT | 4 refills | Status: DC

## 2020-12-15 ENCOUNTER — Ambulatory Visit: Payer: Medicare HMO | Admitting: Internal Medicine

## 2020-12-19 ENCOUNTER — Other Ambulatory Visit: Payer: Self-pay | Admitting: Internal Medicine

## 2020-12-19 DIAGNOSIS — J432 Centrilobular emphysema: Secondary | ICD-10-CM | POA: Diagnosis not present

## 2020-12-19 DIAGNOSIS — S86311A Strain of muscle(s) and tendon(s) of peroneal muscle group at lower leg level, right leg, initial encounter: Secondary | ICD-10-CM | POA: Diagnosis not present

## 2020-12-19 DIAGNOSIS — S86311D Strain of muscle(s) and tendon(s) of peroneal muscle group at lower leg level, right leg, subsequent encounter: Secondary | ICD-10-CM | POA: Diagnosis not present

## 2020-12-19 DIAGNOSIS — G4733 Obstructive sleep apnea (adult) (pediatric): Secondary | ICD-10-CM | POA: Diagnosis not present

## 2020-12-19 DIAGNOSIS — I5032 Chronic diastolic (congestive) heart failure: Secondary | ICD-10-CM | POA: Diagnosis not present

## 2020-12-19 DIAGNOSIS — K219 Gastro-esophageal reflux disease without esophagitis: Secondary | ICD-10-CM

## 2020-12-21 ENCOUNTER — Other Ambulatory Visit: Payer: Self-pay | Admitting: Neurology

## 2020-12-21 DIAGNOSIS — G250 Essential tremor: Secondary | ICD-10-CM

## 2020-12-28 ENCOUNTER — Ambulatory Visit (INDEPENDENT_AMBULATORY_CARE_PROVIDER_SITE_OTHER): Payer: Medicare HMO | Admitting: Internal Medicine

## 2020-12-28 ENCOUNTER — Encounter: Payer: Self-pay | Admitting: Internal Medicine

## 2020-12-28 VITALS — BP 114/74 | HR 62 | Temp 97.9°F | Ht 70.0 in | Wt 242.8 lb

## 2020-12-28 DIAGNOSIS — K219 Gastro-esophageal reflux disease without esophagitis: Secondary | ICD-10-CM

## 2020-12-28 DIAGNOSIS — J418 Mixed simple and mucopurulent chronic bronchitis: Secondary | ICD-10-CM

## 2020-12-28 DIAGNOSIS — R69 Illness, unspecified: Secondary | ICD-10-CM | POA: Diagnosis not present

## 2020-12-28 DIAGNOSIS — R21 Rash and other nonspecific skin eruption: Secondary | ICD-10-CM | POA: Diagnosis not present

## 2020-12-28 DIAGNOSIS — G4700 Insomnia, unspecified: Secondary | ICD-10-CM

## 2020-12-28 DIAGNOSIS — F339 Major depressive disorder, recurrent, unspecified: Secondary | ICD-10-CM | POA: Diagnosis not present

## 2020-12-28 MED ORDER — MONTELUKAST SODIUM 10 MG PO TABS
10.0000 mg | ORAL_TABLET | Freq: Every day | ORAL | 1 refills | Status: DC
Start: 2020-12-28 — End: 2021-02-15

## 2020-12-28 MED ORDER — TRAZODONE HCL 100 MG PO TABS
100.0000 mg | ORAL_TABLET | Freq: Every day | ORAL | 1 refills | Status: DC
Start: 2020-12-28 — End: 2021-06-08

## 2020-12-28 MED ORDER — ESCITALOPRAM OXALATE 20 MG PO TABS
20.0000 mg | ORAL_TABLET | Freq: Every day | ORAL | 1 refills | Status: DC
Start: 2020-12-28 — End: 2022-07-11

## 2020-12-28 MED ORDER — ZOLPIDEM TARTRATE 10 MG PO TABS: 10.0000 mg | ORAL_TABLET | Freq: Every evening | ORAL | 1 refills | Status: DC | PRN

## 2020-12-28 MED ORDER — PANTOPRAZOLE SODIUM 40 MG PO TBEC
40.0000 mg | DELAYED_RELEASE_TABLET | Freq: Every day | ORAL | 1 refills | Status: DC
Start: 2020-12-28 — End: 2021-08-17

## 2020-12-28 NOTE — Progress Notes (Signed)
Acute office Visit     This visit occurred during the SARS-CoV-2 public health emergency.  Safety protocols were in place, including screening questions prior to the visit, additional usage of staff PPE, and extensive cleaning of exam room while observing appropriate contact time as indicated for disinfecting solutions.    CC/Reason for Visit: "Spot on my back"  HPI: Allen [name] is a 70 y.o. male who is coming in today for the above mentioned reasons.  He is here today for evaluation of a spot on his back that is itchy.  The first appointment he can get with dermatology is in January.  He is concerned that it might be melanoma.  Flu and COVID booster is up-to-date.  He is also requesting medication refills.  Past Medical/Surgical History: Past Medical History:  Diagnosis Date   Anxiety    Arthritis    neck -   BPH with obstruction/lower urinary tract symptoms    urologist-- dr pace   Centrilobular emphysema [facility])    followed by dr Halford Chessman   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[facility])    followed by dr Halford Chessman   Cough 07/2020   covid residual, no congestion   DOE (dyspnea on exertion)    due to copd/ emphysema   ED (erectile dysfunction)    Essential tremor    neurologist--- dr tat----  bilateral upper extremities,  s/p DPS 12/ 2016   GERD (gastroesophageal reflux disease)    History of 2019 novel coronavirus disease (COVID-19) 07/18/2020   per pt postive home test 07-18-2020 w/ mild  symptoms per pt; pt stated had urgent care visit 07-24-2020 for sob/ cough with negative cxr (in care everywhere) ;  follow up pcp visit 08-03-2020 in epic   Insomnia    Mixed simple and mucopurulent chronic bronchitis (HCC)    OSA (obstructive sleep apnea)    followed by dr Halford Chessman---- no longer does uses bipap, intolerant --- pt prescriped  Oxygen  2 L oxygen via [place] while sleeping  (study in epic 04/ 2017 [facility])   Perennial allergic rhinitis    Pulmonary nodules    followed by dr Halford Chessman   S/P deep brain stimulator  placement 12/2014   followed by neurology-----IPG change 01-21-2020 for end-of life battery  (pt has a control)   Varicose vein of leg    per pt has had 3 procedure's last one 2006   Wears dentures    upper    Past Surgical History:  Procedure Laterality Date   ANKLE SURGERY Right 2020   tendon repair   CARPAL TUNNEL RELEASE Left 2019   CATARACT EXTRACTION W/ INTRAOCULAR LENS IMPLANT Bilateral 2020   COLONOSCOPY     DEEP BRAIN STIMULATOR PLACEMENT Left 2016   INGUINAL HERNIA REPAIR Right 2007   ROTATOR CUFF REPAIR Right 2014   SUBTHALAMIC STIMULATOR BATTERY REPLACEMENT N/A 01/21/2020   Procedure: Deep brain stimulator battery replacement;  Surgeon: Erline Levine, MD;  Location: [place];  Service: Neurosurgery;  Laterality: N/A;   TONSILLECTOMY AND ADENOIDECTOMY     age 76   TOTAL SHOULDER REPLACEMENT Left 2016   TRANSURETHRAL RESECTION OF PROSTATE N/A 08/09/2020   Procedure: TRANSURETHRAL RESECTION OF THE PROSTATE (TURP), BLADDER BIOPSY AND FULGERATION;  Surgeon: Robley Fries, MD;  Location: [place];  Service: Urology;  Laterality: N/A;   VEIN SURGERY Left 2006   varicose veins    Social History:  reports that he quit smoking about 31 years ago. His smoking use  included cigarettes. He has a 60.00 pack-year smoking history. He has never used smokeless tobacco. He reports that he does not currently use alcohol. He reports that he does not use drugs.  Allergies: No Known Allergies  Family History:  Family History  Problem Relation Age of Onset   COPD Mother    Asthma Father    Rectal cancer Sister    Healthy Child    Urticaria Neg Hx    Immunodeficiency Neg Hx    Eczema Neg Hx    Atopy Neg Hx    Angioedema Neg Hx    Allergic rhinitis Neg Hx      Current Outpatient Medications:    albuterol (PROVENTIL) (2.5 MG/3ML) 0.083% nebulizer solution, Take 3 mLs (2.5 mg total) by nebulization every 6 (six) hours., Disp: 150 mL, Rfl: 3   albuterol (VENTOLIN  HFA) 108 (90 Base) MCG/ACT inhaler, INHALE 2 PUFFS INTO THE LUNGS EVERY 4 HOURS AS NEEDED FOR WHEEZE OR FOR SHORTNESS OF BREATH, Disp: 8.5 each, Rfl: 0   Ascorbic Acid (VITAMIN C) 1000 MG tablet, Take 1,000 mg by mouth daily., Disp: , Rfl:    azelastine (ASTELIN) 0.1 % nasal spray, Place 1-2 sprays into both nostrils 2 (two) times daily as needed for rhinitis., Disp: 30 mL, Rfl: 5   benzonatate (TESSALON) 100 MG capsule, Take 1 capsule (100 mg total) by mouth 2 (two) times daily as needed for cough., Disp: 20 capsule, Rfl: 0   fluticasone (FLONASE) 50 MCG/ACT nasal spray, Place 2 sprays into both nostrils daily as needed for allergies or rhinitis., Disp: 16 g, Rfl: 5   Fluticasone-Umeclidin-Vilant (TRELEGY ELLIPTA) 200-62.5-25 MCG/ACT AEPB, Inhale 1 puff into the lungs daily., Disp: 60 each, Rfl: 4   Fluticasone-Umeclidin-Vilant (TRELEGY ELLIPTA) 200-62.5-25 MCG/INH AEPB, Inhale 1 puff into the lungs daily., Disp: 3 each, Rfl: 3   folic acid (FOLVITE) 001 MCG tablet, Take 400 mcg by mouth daily., Disp: , Rfl:    HYDROcodone-acetaminophen (NORCO/VICODIN) 5-325 MG tablet, Take 1-2 tablets by mouth every 4 (four) hours as needed for moderate pain., Disp: 30 tablet, Rfl: 0   hydrOXYzine (VISTARIL) 25 MG capsule, Take 25 mg by mouth 2 (two) times daily., Disp: , Rfl:    predniSONE (DELTASONE) 20 MG tablet, Take 1 tablet (20 mg total) by mouth daily with breakfast., Disp: 5 tablet, Rfl: 0   primidone (MYSOLINE) 50 MG tablet, TAKE 1 TABLET BY MOUTH TWICE A DAY, Disp: 180 tablet, Rfl: 0   silodosin (RAPAFLO) 8 MG CAPS capsule, Take 1 capsule (8 mg total) by mouth daily., Disp: 30 capsule, Rfl: 1   tadalafil (CIALIS) 20 MG tablet, Take 1 tablet (20 mg total) by mouth daily as needed for erectile dysfunction. (Patient taking differently: Take 10 mg by mouth daily as needed.), Disp: 90 tablet, Rfl: 3   tiZANidine (ZANAFLEX) 2 MG tablet, TAKE 1 TABLET BY MOUTH EVERYDAY AT BEDTIME (Patient taking differently: Take  2 mg by mouth at bedtime as needed. TAKE 1 TABLET BY MOUTH EVERYDAY AT BEDTIME), Disp: 90 tablet, Rfl: 1   escitalopram (LEXAPRO) 20 MG tablet, Take 1 tablet (20 mg total) by mouth daily., Disp: 90 tablet, Rfl: 1   montelukast (SINGULAIR) 10 MG tablet, Take 1 tablet (10 mg total) by mouth at bedtime., Disp: 90 tablet, Rfl: 1   pantoprazole (PROTONIX) 40 MG tablet, Take 1 tablet (40 mg total) by mouth daily., Disp: 90 tablet, Rfl: 1   traZODone (DESYREL) 100 MG tablet, Take 1 tablet (100 mg total) by  mouth at bedtime., Disp: 90 tablet, Rfl: 1   zolpidem (AMBIEN) 10 MG tablet, Take 1 tablet (10 mg total) by mouth at bedtime as needed for sleep., Disp: 90 tablet, Rfl: 1  Review of Systems:  Constitutional: Denies fever, chills, diaphoresis, appetite change and fatigue.  HEENT: Denies photophobia, eye pain, redness, hearing loss, ear pain, congestion, sore throat, rhinorrhea, sneezing, mouth sores, trouble swallowing, neck pain, neck stiffness and tinnitus.   Respiratory: Denies SOB, DOE, cough, chest tightness,  and wheezing.   Cardiovascular: Denies chest pain, palpitations and leg swelling.  Gastrointestinal: Denies nausea, vomiting, abdominal pain, diarrhea, constipation, blood in stool and abdominal distention.  Genitourinary: Denies dysuria, urgency, frequency, hematuria, flank pain and difficulty urinating.  Endocrine: Denies: hot or cold intolerance, sweats, changes in hair or nails, polyuria, polydipsia. Musculoskeletal: Denies myalgias, back pain, joint swelling, arthralgias and gait problem.  Skin: Denies pallor and wound.  Neurological: Denies dizziness, seizures, syncope, weakness, light-headedness, numbness and headaches.  Hematological: Denies adenopathy. Easy bruising, personal or family bleeding history  Psychiatric/Behavioral: Denies suicidal ideation, mood changes, confusion, nervousness, sleep disturbance and agitation    Physical Exam: Vitals:   12/28/20 1328  BP: 114/74   Pulse: 62  Temp: 97.9 F (36.6 C)  TempSrc: Oral  SpO2: 98%  Weight: 242 lb 12.8 oz (110.1 kg)  Height: 5\' 10"  (1.778 m)    Body mass index is 34.84 kg/m.   Constitutional: NAD, calm, comfortable Eyes: PERRL, lids and conjunctivae normal ENMT: Mucous membranes are moist.  Respiratory: clear to auscultation bilaterally, no wheezing, no crackles. Normal respiratory effort. No accessory muscle use.  Cardiovascular: Regular rate and rhythm, no murmurs / rubs / gallops. No extremity edema.  Skin: No skin lesion is apparent to me at area that he is describing. Psychiatric: Normal judgment and insight. Alert and oriented x 3. Normal mood.    Impression and Plan:  Rash and nonspecific skin eruption -Nothing apparent today on inspection or palpation, okay to follow-up with dermatology in January.  Mixed simple and mucopurulent chronic bronchitis (HCC) - Plan: montelukast (SINGULAIR) 10 MG tablet  Gastroesophageal reflux disease, unspecified whether esophagitis present - Plan: pantoprazole (PROTONIX) 40 MG tablet  Depression, recurrent ([place]) - Plan: escitalopram (LEXAPRO) 20 MG tablet, traZODone (DESYREL) 100 MG tablet  Insomnia, unspecified type - Plan: zolpidem (AMBIEN) 10 MG tablet  Time spent: 21 minutes reviewing chart, interviewing and examining patient and formulating plan of care.     Lelon Frohlich, MD [facility] at [facility]

## 2020-12-29 DIAGNOSIS — R3911 Hesitancy of micturition: Secondary | ICD-10-CM | POA: Diagnosis not present

## 2020-12-29 DIAGNOSIS — N5201 Erectile dysfunction due to arterial insufficiency: Secondary | ICD-10-CM | POA: Diagnosis not present

## 2020-12-29 DIAGNOSIS — R972 Elevated prostate specific antigen [PSA]: Secondary | ICD-10-CM | POA: Diagnosis not present

## 2020-12-29 DIAGNOSIS — N401 Enlarged prostate with lower urinary tract symptoms: Secondary | ICD-10-CM | POA: Diagnosis not present

## 2020-12-30 ENCOUNTER — Other Ambulatory Visit: Payer: Self-pay

## 2020-12-30 DIAGNOSIS — G250 Essential tremor: Secondary | ICD-10-CM

## 2020-12-30 MED ORDER — PRIMIDONE 50 MG PO TABS: 50.0000 mg | ORAL_TABLET | Freq: Two times a day (BID) | ORAL | 0 refills | Status: DC

## 2021-01-02 ENCOUNTER — Other Ambulatory Visit: Payer: Self-pay

## 2021-01-02 ENCOUNTER — Telehealth: Payer: Self-pay | Admitting: Neurology

## 2021-01-02 DIAGNOSIS — G250 Essential tremor: Secondary | ICD-10-CM

## 2021-01-02 MED ORDER — PRIMIDONE 50 MG PO TABS: 50.0000 mg | ORAL_TABLET | Freq: Two times a day (BID) | ORAL | 0 refills | Status: DC

## 2021-01-02 NOTE — Telephone Encounter (Signed)
Called patient to let him know I sent prescription in to [facility] and have resent that prescription

## 2021-01-02 NOTE — Telephone Encounter (Signed)
Pt called and left a message stating the pharmacy has been requesting a refill of his Primidone and we have not been responding. He needs a refill

## 2021-01-17 ENCOUNTER — Other Ambulatory Visit: Payer: Self-pay | Admitting: Adult Health

## 2021-01-18 DIAGNOSIS — L570 Actinic keratosis: Secondary | ICD-10-CM | POA: Diagnosis not present

## 2021-01-25 DIAGNOSIS — F411 Generalized anxiety disorder: Secondary | ICD-10-CM | POA: Diagnosis not present

## 2021-01-25 DIAGNOSIS — F410 Panic disorder [episodic paroxysmal anxiety] without agoraphobia: Secondary | ICD-10-CM | POA: Diagnosis not present

## 2021-01-26 DIAGNOSIS — Z0100 Encounter for examination of eyes and vision without abnormal findings: Secondary | ICD-10-CM | POA: Diagnosis not present

## 2021-01-26 DIAGNOSIS — H524 Presbyopia: Secondary | ICD-10-CM | POA: Diagnosis not present

## 2021-01-26 NOTE — Progress Notes (Signed)
Assessment/Plan:    1.  Essential Tremor  Patient had DBS surgery on the left brain in [place] in 2016.  Patient status post IPG change in January, 2022.  -Slightly increase primidone 50 mg, 2 tablets in the morning and 1 tablet at night  2.  COPD  -Following with Dr. Halford Chessman.  On meds which could contribute to tremors.  3.  Toe paresthesias  -discussed EMG test.  He wants to hold on that for now.  He will let me know if he changes his mind.  Really does not sound like it is coming from L5/S1 as he has no back pain.  Subjective:   Allen [name] was seen today in follow up for essential tremor.  My previous records were reviewed prior to todays visit.  Patient wanted to start primidone last visit for left hand tremor (surgery previously for right hand tremor).  Notes intermittent tremor tremor in the R hand.  C/o paresthesias in the 4th and 5th digit on the L.  Mostly at night.  No back pain.  Nothing down the leg.  No shooting pain.    Current prescribed movement disorder medications: Primidone, 1 tablet twice per day (started last visit)    PREVIOUS MEDICATIONS:  primidone (pt wanted to restart but felt caused ED at low dose), propranolol, gabapentin; not beta-blocker candidate now because of asthma  ALLERGIES:  No Known Allergies  CURRENT MEDICATIONS:  Outpatient Encounter Medications as of 01/30/2021  Medication Sig   albuterol (PROVENTIL) (2.5 MG/3ML) 0.083% nebulizer solution Take 3 mLs (2.5 mg total) by nebulization every 6 (six) hours.   albuterol (VENTOLIN HFA) 108 (90 Base) MCG/ACT inhaler INHALE 2 PUFFS INTO THE LUNGS EVERY 4 HOURS AS NEEDED FOR WHEEZE OR FOR SHORTNESS OF BREATH   Ascorbic Acid (VITAMIN C) 1000 MG tablet Take 1,000 mg by mouth daily.   azelastine (ASTELIN) 0.1 % nasal spray Place 1-2 sprays into both nostrils 2 (two) times daily as needed for rhinitis.   benzonatate (TESSALON) 100 MG capsule Take 1 capsule (100 mg total) by mouth 2 (two) times daily as  needed for cough.   escitalopram (LEXAPRO) 20 MG tablet Take 1 tablet (20 mg total) by mouth daily.   fluticasone (FLONASE) 50 MCG/ACT nasal spray Place 2 sprays into both nostrils daily as needed for allergies or rhinitis.   Fluticasone-Umeclidin-Vilant (TRELEGY ELLIPTA) 200-62.5-25 MCG/ACT AEPB Inhale 1 puff into the lungs daily.   Fluticasone-Umeclidin-Vilant (TRELEGY ELLIPTA) 200-62.5-25 MCG/INH AEPB Inhale 1 puff into the lungs daily.   folic acid (FOLVITE) 622 MCG tablet Take 400 mcg by mouth daily.   HYDROcodone-acetaminophen (NORCO/VICODIN) 5-325 MG tablet Take 1-2 tablets by mouth every 4 (four) hours as needed for moderate pain.   hydrOXYzine (VISTARIL) 25 MG capsule Take 25 mg by mouth 2 (two) times daily.   montelukast (SINGULAIR) 10 MG tablet Take 1 tablet (10 mg total) by mouth at bedtime.   pantoprazole (PROTONIX) 40 MG tablet Take 1 tablet (40 mg total) by mouth daily.   predniSONE (DELTASONE) 20 MG tablet Take 1 tablet (20 mg total) by mouth daily with breakfast.   primidone (MYSOLINE) 50 MG tablet Take 1 tablet (50 mg total) by mouth 2 (two) times daily.   silodosin (RAPAFLO) 8 MG CAPS capsule Take 1 capsule (8 mg total) by mouth daily.   tadalafil (CIALIS) 20 MG tablet Take 1 tablet (20 mg total) by mouth daily as needed for erectile dysfunction. (Patient taking differently: Take 10 mg by mouth daily  as needed.)   tiZANidine (ZANAFLEX) 2 MG tablet TAKE 1 TABLET BY MOUTH EVERYDAY AT BEDTIME (Patient taking differently: Take 2 mg by mouth at bedtime as needed. TAKE 1 TABLET BY MOUTH EVERYDAY AT BEDTIME)   traZODone (DESYREL) 100 MG tablet Take 1 tablet (100 mg total) by mouth at bedtime.   zolpidem (AMBIEN) 10 MG tablet Take 1 tablet (10 mg total) by mouth at bedtime as needed for sleep.   No facility-administered encounter medications on file as of 01/30/2021.     Objective:    PHYSICAL EXAMINATION:    VITALS:   Vitals:   01/30/21 1410  BP: 124/84  Pulse: 77  SpO2:  98%  Weight: 239 lb 12.8 oz (108.8 kg)  Height: 5\' 8"  (1.727 m)     GEN:  The patient appears stated age and is in NAD. HEENT:  Normocephalic, atraumatic.  The mucous membranes are moist. The superficial temporal arteries are without ropiness or tenderness.   Neurological examination:  Orientation: The patient is alert and oriented x3. Cranial nerves: There is good facial symmetry. The speech is fluent and clear. Soft palate rises symmetrically and there is no tongue deviation. Hearing is intact to conversational tone. Sensation: Sensation is intact to light touch throughout Motor: Strength is at least antigravity x4.  Movement examination: Tone: There is normal tone in the UE/LE Abnormal movements: No tremor of the right hand.  He does have mild to moderate postural tremor of the left hand.  I have reviewed and interpreted the following labs independently   Chemistry      Component Value Date/Time   NA 143 06/15/2020 1404   K 4.5 06/15/2020 1404   CL 105 06/15/2020 1404   CO2 19 06/15/2020 1404   BUN 23 06/15/2020 1404   CREATININE 1.13 06/15/2020 1404      Component Value Date/Time   CALCIUM 9.5 06/15/2020 1404   ALKPHOS 61 06/15/2020 1404   AST 17 06/15/2020 1404   ALT 13 06/15/2020 1404   BILITOT 0.3 06/15/2020 1404      Lab Results  Component Value Date   WBC 8.1 06/15/2020   HGB 13.3 06/15/2020   HCT 40.1 06/15/2020   MCV 95.0 06/15/2020   PLT 295.0 06/15/2020   Lab Results  Component Value Date   TSH 1.69 06/15/2020     Chemistry      Component Value Date/Time   NA 143 06/15/2020 1404   K 4.5 06/15/2020 1404   CL 105 06/15/2020 1404   CO2 19 06/15/2020 1404   BUN 23 06/15/2020 1404   CREATININE 1.13 06/15/2020 1404      Component Value Date/Time   CALCIUM 9.5 06/15/2020 1404   ALKPHOS 61 06/15/2020 1404   AST 17 06/15/2020 1404   ALT 13 06/15/2020 1404   BILITOT 0.3 06/15/2020 1404         Total time spent on today's visit was 20  minutes, including both face-to-face time and nonface-to-face time.  Time included that spent on review of records (prior notes available to me/labs/imaging if pertinent), discussing treatment and goals, answering patient's questions and coordinating care.  This was separate from the DBS time.  Cc:  Isaac Bliss, Rayford Halsted, MD

## 2021-01-30 ENCOUNTER — Other Ambulatory Visit: Payer: Self-pay

## 2021-01-30 ENCOUNTER — Ambulatory Visit: Payer: Medicare HMO | Admitting: Neurology

## 2021-01-30 ENCOUNTER — Encounter: Payer: Self-pay | Admitting: Neurology

## 2021-01-30 VITALS — BP 124/84 | HR 77 | Ht 68.0 in | Wt 239.8 lb

## 2021-01-30 DIAGNOSIS — R202 Paresthesia of skin: Secondary | ICD-10-CM | POA: Diagnosis not present

## 2021-01-30 DIAGNOSIS — G250 Essential tremor: Secondary | ICD-10-CM

## 2021-01-30 MED ORDER — PRIMIDONE 50 MG PO TABS: ORAL_TABLET | ORAL | 1 refills | Status: DC

## 2021-01-30 NOTE — Patient Instructions (Signed)
Increase primidone to 50 mg, 2 in the AM and 1 at bed.

## 2021-01-30 NOTE — Procedures (Signed)
DBS Programming was performed.  Patient's device interrogated.  DBS confirmed to be on.  Battery not near end-of-life.  Impedances were normal.  Total time spent programming the device was 8 minutes.  Final settings were as follows.  Manufacturer of DBS device: [employer]      Active Contact Amplitude (V) PW (ms) Frequency (hz) Side Effects Battery  Left Brain        Group A        11/26/19 2-C+ 2.1 60 150    01/04/20 2-C+ 2.1 60 150            Group B (active)        11/26/19 2-0+ 2.9 90 170  2.63  01/04/20 2-0+ 2.9 90 170  2.56  07/04/20 2-0+ 3.1 90 170  2.96  01/30/21 2-0+ 3.2 90 170  2.95                                  Right Brain        n/a

## 2021-02-14 ENCOUNTER — Telehealth: Payer: Self-pay | Admitting: Internal Medicine

## 2021-02-14 NOTE — Chronic Care Management (AMB) (Signed)
°  Chronic Care Management   Note  02/14/2021 Name: Allen [name] MRN: [id number] DOB: [birth year]  Allen [name] is a 71 y.o. year old male who is a primary care patient of Isaac Bliss, Rayford Halsted, MD. I reached out to [name] by phone today in response to a referral sent by Mr. Lennix Kneisel PCP, Isaac Bliss, Rayford Halsted, MD.   Mr. Knerr was given information about Chronic Care Management services today including:  CCM service includes personalized support from designated clinical staff supervised by his physician, including individualized plan of care and coordination with other care providers 24/7 contact phone numbers for assistance for urgent and routine care needs. Service will only be billed when office clinical staff spend 20 minutes or more in a month to coordinate care. Only one practitioner may furnish and bill the service in a calendar month. The patient may stop CCM services at any time (effective at the end of the month) by phone call to the office staff.   Patient agreed to services and verbal consent obtained.   Follow up plan:   Tatjana Secretary/administrator

## 2021-02-15 ENCOUNTER — Encounter: Payer: Self-pay | Admitting: Pulmonary Disease

## 2021-02-15 ENCOUNTER — Ambulatory Visit: Payer: Medicare HMO | Admitting: Pulmonary Disease

## 2021-02-15 ENCOUNTER — Other Ambulatory Visit: Payer: Self-pay

## 2021-02-15 VITALS — BP 150/62 | HR 76 | Temp 97.6°F | Ht 70.0 in | Wt 240.4 lb

## 2021-02-15 DIAGNOSIS — J449 Chronic obstructive pulmonary disease, unspecified: Secondary | ICD-10-CM | POA: Diagnosis not present

## 2021-02-15 DIAGNOSIS — J45909 Unspecified asthma, uncomplicated: Secondary | ICD-10-CM | POA: Diagnosis not present

## 2021-02-15 DIAGNOSIS — R0602 Shortness of breath: Secondary | ICD-10-CM | POA: Diagnosis not present

## 2021-02-15 DIAGNOSIS — G4733 Obstructive sleep apnea (adult) (pediatric): Secondary | ICD-10-CM

## 2021-02-15 DIAGNOSIS — J439 Emphysema, unspecified: Secondary | ICD-10-CM

## 2021-02-15 DIAGNOSIS — J418 Mixed simple and mucopurulent chronic bronchitis: Secondary | ICD-10-CM | POA: Diagnosis not present

## 2021-02-15 DIAGNOSIS — J9611 Chronic respiratory failure with hypoxia: Secondary | ICD-10-CM | POA: Diagnosis not present

## 2021-02-15 MED ORDER — TRELEGY ELLIPTA 200-62.5-25 MCG/ACT IN AEPB: 1.0000 | INHALATION_SPRAY | Freq: Every day | RESPIRATORY_TRACT | 0 refills | Status: AC

## 2021-02-15 MED ORDER — MONTELUKAST SODIUM 10 MG PO TABS: 10.0000 mg | ORAL_TABLET | Freq: Every day | ORAL | 3 refills | Status: DC

## 2021-02-15 MED ORDER — AZITHROMYCIN 250 MG PO TABS: ORAL_TABLET | ORAL | 0 refills | Status: DC

## 2021-02-15 MED ORDER — PREDNISONE 10 MG PO TABS: ORAL_TABLET | ORAL | 0 refills | Status: AC

## 2021-02-15 MED ORDER — TRELEGY ELLIPTA 200-62.5-25 MCG/ACT IN AEPB: 1.0000 | INHALATION_SPRAY | Freq: Every day | RESPIRATORY_TRACT | 3 refills | Status: DC

## 2021-02-15 NOTE — Progress Notes (Signed)
[facility]  Chief Complaint  Patient presents with   Follow-up    Increased sob with exertion getting worse, occass. Cough dry, hoaseness    Constitutional:  BP (!) 150/62 (BP Location: Left Arm, Cuff Size: Normal)    Pulse 76    Temp 97.6 F (36.4 C) (Temporal)    Ht 5\' 10"  (1.778 m)    Wt 240 lb 6.4 oz (109 kg)    SpO2 96%    BMI 34.49 kg/m   Past Medical History:  Carpal tunnel, GERD, Tremor, ED, Cataracts, Obstructive sleep apnea intolerant of CPAP/Bipap, Anxiety, BPH, ED, COVID July 2022  Past Surgical History:  His  has a past surgical history that includes Inguinal hernia repair (Right, 2007); Cataract extraction w/ intraocular lens implant (Bilateral, 2020); Vein Surgery (Left, 2006); Rotator cuff repair (Right, 2014); Total shoulder replacement (Left, 2016); Deep brain stimulator placement (Left, 2016); Carpal tunnel release (Left, 2019); Tonsillectomy and adenoidectomy; Ankle surgery (Right, 2020); Colonoscopy; Subthalamic stimulator battery replacement (N/A, 01/21/2020); and Transurethral resection of prostate (N/A, 08/09/2020).  Brief Summary:  Allen [name] is a 71 y.o. male former smoker with asthma, emphysema, and sleep related hypoxia.      Subjective:   He had COVID last Summer.  He gets winded more easily walking up stairs or up a hill.  He recovers after resting about 5 minutes.  Inhalers help sometimes.    He is using oxygen at night sometimes, but not sure if it is helping.  He snores at night and his wife says his breathing gets shallow when asleep.  He is tired during the day and has trouble staying awake sometimes.  He tried CPAP and Bipap before, but main difficulty was with mask fit.  He continues to use trazodone, ambien, and vistaril to help sleep.  These will help him fall asleep, but he still wakes up during the night.  Epworth score 12 out of 24.  Physical Exam:   Appearance - well kempt   ENMT - no  sinus tenderness, no oral exudate, no LAN, Mallampati 3 airway, no stridor  Respiratory - equal breath sounds bilaterally, no wheezing or rales  CV - s1s2 regular rate and rhythm, no murmurs  Ext - no clubbing, no edema  Skin - no rashes  Psych - normal mood and affect     Pulmonary testing:  PFT 12/21/16 >> FEV1 2.40 (69%), FEV1% 74, TLC 4.95 (73%), DLCO 77%, +BD IgE 04/10/19 >> 66 PFT 04/20/19 >> FEV1 2.09 (62%), FEV1% 74, TLC 5.33 (75%), DLCO 93%  Chest Imaging:  CT chest 05/04/19 >> centrilobular and paraseptal emphysema, 3 mm nodule RUL, 4 mm nodule RML, 3 mm nodule lingula CT chest 05/11/20 >> stable nodules up to 3 mm  Sleep Tests:  HST 04/20/15 >> AHI 40 Auto Bipap 05/21/19 to 06/01/19 >> used on 11 of 12 nights with average 6 hrs 50 min.  Average AHI 9.1 with median Bipap 11/7 and 95 th percentile Bipap 12/8 cm H2O. ONO with RA 11/17/19 >> test time 2 hrs 42 min.  Baseline SpO2 88%, low SpO2 82%.  Spent 1 hr 27 min with SpO2 < 88%.  Cardiac Tests:  Echo 05/29/19 >> EF 60 to 65%, grade 1 DD, mod LA dilation  Social History:  He  reports that he quit smoking about 32 years ago. His smoking use included cigarettes. He has a 60.00 pack-year smoking history. He has never used smokeless tobacco. He reports that  he does not currently use alcohol. He reports that he does not use drugs.  Family History:  His family history includes Asthma in his father; COPD in his mother; Healthy in his child; Rectal cancer in his sister.     Assessment/Plan:   COPD with emphysema and asthma. - breztri and symbicort were ineffective and too expensive - continue trelegy 200 one puff daily - continue singulair 10 mg nightly - prn albuterol - he keeps a script of zithromax and prednisone in case of flare up when he travels  Chronic respiratory failure with hypoxia. - from sleep related hypoventilation and emphysema - he uses 2 liters oxygen at night - [employer] is his DME  Snoring. - he has  apnea, disrupted sleep and daytime sleepiness - he has history of mood disorder - I am concerned he could still have obstructive sleep apnea - will arrange for home sleep study to assess current status and then determine if he should reconsider PAP therapy  Lung nodules. - these are tiny and have been stable over serial CT imaging - likely benign - no additional radiographic follow up needed  Perennial allergic rhinitis with nonallergic component. - followed by Dr. Salvatore Marvel with [facility]   Tremor, Foot paresthesia. - followed by Dr. Wells Guiles Tat with [facility]   Time Spent Involved in Patient Care on Day of Examination:  37 minutes  Follow up:   Patient Instructions  Will arrange for home sleep study and call with results  Follow up in 6 months  Medication List:   Allergies as of 02/15/2021   No Known Allergies      Medication List        Accurate as of February 15, 2021 11:15 AM. If you have any questions, ask your nurse or doctor.          albuterol (2.5 MG/3ML) 0.083% nebulizer solution Commonly known as: PROVENTIL Take 3 mLs (2.5 mg total) by nebulization every 6 (six) hours.   albuterol 108 (90 Base) MCG/ACT inhaler Commonly known as: VENTOLIN HFA INHALE 2 PUFFS INTO THE LUNGS EVERY 4 HOURS AS NEEDED FOR WHEEZE OR FOR SHORTNESS OF BREATH   azelastine 0.1 % nasal spray Commonly known as: ASTELIN Place 1-2 sprays into both nostrils 2 (two) times daily as needed for rhinitis.   benzonatate 100 MG capsule Commonly known as: TESSALON Take 1 capsule (100 mg total) by mouth 2 (two) times daily as needed for cough.   escitalopram 20 MG tablet Commonly known as: LEXAPRO Take 1 tablet (20 mg total) by mouth daily.   fluticasone 50 MCG/ACT nasal spray Commonly known as: Flonase Place 2 sprays into both nostrils daily as needed for allergies or rhinitis.   folic acid 161 MCG tablet Commonly known as: FOLVITE Take 400 mcg by mouth  daily.   HYDROcodone-acetaminophen 5-325 MG tablet Commonly known as: NORCO/VICODIN Take 1-2 tablets by mouth every 4 (four) hours as needed for moderate pain.   hydrOXYzine 25 MG capsule Commonly known as: VISTARIL Take 25 mg by mouth 2 (two) times daily.   montelukast 10 MG tablet Commonly known as: SINGULAIR Take 1 tablet (10 mg total) by mouth at bedtime.   pantoprazole 40 MG tablet Commonly known as: PROTONIX Take 1 tablet (40 mg total) by mouth daily.   predniSONE 20 MG tablet Commonly known as: DELTASONE Take 1 tablet (20 mg total) by mouth daily with breakfast.   primidone 50 MG tablet Commonly known as: MYSOLINE 2 in the  AM, 1 in the PM   silodosin 8 MG Caps capsule Commonly known as: RAPAFLO Take 1 capsule (8 mg total) by mouth daily.   tadalafil 20 MG tablet Commonly known as: CIALIS Take 1 tablet (20 mg total) by mouth daily as needed for erectile dysfunction. What changed:  how much to take reasons to take this   tiZANidine 2 MG tablet Commonly known as: ZANAFLEX TAKE 1 TABLET BY MOUTH EVERYDAY AT BEDTIME   traZODone 100 MG tablet Commonly known as: DESYREL Take 1 tablet (100 mg total) by mouth at bedtime.   Trelegy Ellipta 200-62.5-25 MCG/ACT Aepb Generic drug: Fluticasone-Umeclidin-Vilant Inhale 1 puff into the lungs daily.   Trelegy Ellipta 200-62.5-25 MCG/ACT Aepb Generic drug: Fluticasone-Umeclidin-Vilant Inhale 1 puff into the lungs daily.   vitamin C 1000 MG tablet Take 1,000 mg by mouth daily.   zolpidem 10 MG tablet Commonly known as: AMBIEN Take 1 tablet (10 mg total) by mouth at bedtime as needed for sleep.        Signature:  Chesley Mires, MD [place] Pager - [phone] 02/15/2021, 11:15 AM

## 2021-02-15 NOTE — Patient Instructions (Signed)
Will arrange for home sleep study and call with results  Follow up in 6 months 

## 2021-02-24 ENCOUNTER — Other Ambulatory Visit: Payer: Self-pay | Admitting: Pulmonary Disease

## 2021-02-28 DIAGNOSIS — I700 Atherosclerosis of aorta: Secondary | ICD-10-CM | POA: Diagnosis not present

## 2021-02-28 DIAGNOSIS — R1031 Right lower quadrant pain: Secondary | ICD-10-CM | POA: Diagnosis not present

## 2021-02-28 DIAGNOSIS — K5730 Diverticulosis of large intestine without perforation or abscess without bleeding: Secondary | ICD-10-CM | POA: Diagnosis not present

## 2021-02-28 DIAGNOSIS — N401 Enlarged prostate with lower urinary tract symptoms: Secondary | ICD-10-CM | POA: Diagnosis not present

## 2021-02-28 DIAGNOSIS — R350 Frequency of micturition: Secondary | ICD-10-CM | POA: Diagnosis not present

## 2021-03-02 ENCOUNTER — Encounter: Payer: Self-pay | Admitting: Internal Medicine

## 2021-03-10 ENCOUNTER — Encounter: Payer: Self-pay | Admitting: Podiatry

## 2021-03-10 ENCOUNTER — Ambulatory Visit (INDEPENDENT_AMBULATORY_CARE_PROVIDER_SITE_OTHER): Payer: Medicare HMO | Admitting: Podiatry

## 2021-03-10 ENCOUNTER — Other Ambulatory Visit: Payer: Self-pay

## 2021-03-10 DIAGNOSIS — B351 Tinea unguium: Secondary | ICD-10-CM

## 2021-03-10 DIAGNOSIS — M79674 Pain in right toe(s): Secondary | ICD-10-CM

## 2021-03-10 DIAGNOSIS — M79675 Pain in left toe(s): Secondary | ICD-10-CM

## 2021-03-10 NOTE — Progress Notes (Signed)
This patient returns to the office for evaluation and treatment of long thick painful nails .  This patient is unable to trim his own nails since the patient cannot reach his feet.  Patient says the nails are painful walking and wearing his shoes.  He returns for preventive foot care services. Patient had surgery by Dr.  March Rummage.  General Appearance  Alert, conversant and in no acute stress.  Vascular  Dorsalis pedis and posterior tibial  pulses are palpable  bilaterally.  Capillary return is within normal limits  bilaterally. Temperature is within normal limits  bilaterally.  Neurologic  Senn-Weinstein monofilament wire test within normal limits  bilaterally. Muscle power within normal limits bilaterally.  Nails Thick disfigured discolored nails with subungual debris  from hallux to fifth toes bilaterally.  Hallux nails are especially thickened and split and painful   No evidence of bacterial infection or drainage bilaterally.  Orthopedic  No limitations of motion  feet .  No crepitus or effusions noted.  No bony pathology or digital deformities noted.  Skin  normotropic skin with no porokeratosis noted bilaterally.  No signs of infections or ulcers noted.     Onychomycosis  Pain in toes right foot  Pain in toes left foot  Debridement  of nails  1-5  B/L with a nail nipper.  Nails were then filed using a dremel tool with no incidents.    RTC 3 months    Gardiner Barefoot DPM

## 2021-03-14 NOTE — Progress Notes (Signed)
Allen [name] [address] [place] Phone: [phone] Subjective:   [name], am serving as a scribe for Dr. Hulan Saas. This visit occurred during the SARS-CoV-2 public health emergency.  Safety protocols were in place, including screening questions prior to the visit, additional usage of staff PPE, and extensive cleaning of exam room while observing appropriate contact time as indicated for disinfecting solutions.   I'm seeing this patient by the request  of:  Isaac Bliss, Rayford Halsted, MD  CC: neck pain follow up   HFW:[id number]  Allen [name] is a 71 y.o. male coming in with complaint of neck pain. Last seen in February 2022 for DDD cervical spine. Patient states wanted the injections in his neck. Pain radiates down neck. Can be a constant pain. All interventions tried do not work. Taking advil regularly   MRI cervical March 2022 IMPRESSION: 1. C3-4: Spinal stenosis with AP diameter of the canal only 6.1 mm. Effacement of the subarachnoid space but no frank compression of the cord. Bilateral facet osteoarthritis. Bilateral foraminal stenosis could affect either C4 nerve. 2. Lesser canal narrowing at C4-5, C5-6 and C6-7. No compressive canal or foraminal narrowing at those levels. 3. Facet osteoarthritis in the upper cervical region and upper thoracic region that could relate to neck pain.    Past Medical History:  Diagnosis Date   Anxiety    Arthritis    neck -   BPH with obstruction/lower urinary tract symptoms    urologist-- dr pace   Centrilobular emphysema [facility])    followed by dr Halford Chessman   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[facility])    followed by dr Halford Chessman   Cough 07/2020   covid residual, no congestion   DOE (dyspnea on exertion)    due to copd/ emphysema   ED (erectile dysfunction)    Essential tremor    neurologist--- dr tat----  bilateral upper extremities,  s/p DPS 12/ 2016   GERD (gastroesophageal reflux disease)    History  of 2019 novel coronavirus disease (COVID-19) 07/18/2020   per pt postive home test 07-18-2020 w/ mild  symptoms per pt; pt stated had urgent care visit 07-24-2020 for sob/ cough with negative cxr (in care everywhere) ;  follow up pcp visit 08-03-2020 in epic   Insomnia    Mixed simple and mucopurulent chronic bronchitis (HCC)    OSA (obstructive sleep apnea)    followed by dr Halford Chessman---- no longer does uses bipap, intolerant --- pt prescriped  Oxygen  2 L oxygen via [place] while sleeping  (study in epic 04/ 2017 AHI 40)   Perennial allergic rhinitis    Pulmonary nodules    followed by dr Halford Chessman   S/P deep brain stimulator placement 12/2014   followed by neurology-----IPG change 01-21-2020 for end-of life battery  (pt has a control)   Varicose vein of leg    per pt has had 3 procedure's last one 2006   Wears dentures    upper   Past Surgical History:  Procedure Laterality Date   ANKLE SURGERY Right 2020   tendon repair   CARPAL TUNNEL RELEASE Left 2019   CATARACT EXTRACTION W/ INTRAOCULAR LENS IMPLANT Bilateral 2020   COLONOSCOPY     DEEP BRAIN STIMULATOR PLACEMENT Left 2016   INGUINAL HERNIA REPAIR Right 2007   ROTATOR CUFF REPAIR Right 2014   SUBTHALAMIC STIMULATOR BATTERY REPLACEMENT N/A 01/21/2020   Procedure: Deep brain stimulator battery replacement;  Surgeon: Erline Levine, MD;  Location:  [facility];  Service: Neurosurgery;  Laterality: N/A;   TONSILLECTOMY AND ADENOIDECTOMY     age 89   TOTAL SHOULDER REPLACEMENT Left 2016   TRANSURETHRAL RESECTION OF PROSTATE N/A 08/09/2020   Procedure: TRANSURETHRAL RESECTION OF THE PROSTATE (TURP), BLADDER BIOPSY AND FULGERATION;  Surgeon: Robley Fries, MD;  Location: [facility];  Service: Urology;  Laterality: N/A;   VEIN SURGERY Left 2006   varicose veins   Social History   Socioeconomic History   Marital status: Married    Spouse name: Not on file   Number of children: 3   Years of education: Not on file   Highest  education level: Associate degree: occupational, Hotel manager, or vocational program  Occupational History   Not on file  Tobacco Use   Smoking status: Former    Packs/day: 3.00    Years: 20.00    Pack years: 60.00    Types: Cigarettes    Quit date: 1991    Years since quitting: 32.1   Smokeless tobacco: Never  Vaping Use   Vaping Use: Never used  Substance and Sexual Activity   Alcohol use: Not Currently    Comment: occassionally   Drug use: Never   Sexual activity: Yes  Other Topics Concern   Not on file  Social History Narrative   Not on file   Social Determinants of Health   Financial Resource Strain: Not on file  Food Insecurity: Not on file  Transportation Needs: Not on file  Physical Activity: Not on file  Stress: Not on file  Social Connections: Not on file   No Known Allergies Family History  Problem Relation Age of Onset   COPD Mother    Asthma Father    Rectal cancer Sister    Healthy Child    Urticaria Neg Hx    Immunodeficiency Neg Hx    Eczema Neg Hx    Atopy Neg Hx    Angioedema Neg Hx    Allergic rhinitis Neg Hx     Current Outpatient Medications (Endocrine & Metabolic):    predniSONE (DELTASONE) 20 MG tablet, Take 2 tablets (40 mg total) by mouth daily with breakfast.  Current Outpatient Medications (Cardiovascular):    tadalafil (CIALIS) 20 MG tablet, Take 1 tablet (20 mg total) by mouth daily as needed for erectile dysfunction. (Patient taking differently: Take 10 mg by mouth daily as needed.)  Current Outpatient Medications (Respiratory):    albuterol (PROVENTIL) (2.5 MG/3ML) 0.083% nebulizer solution, Take 3 mLs (2.5 mg total) by nebulization every 6 (six) hours. (Patient not taking: Reported on 02/15/2021)   albuterol (VENTOLIN HFA) 108 (90 Base) MCG/ACT inhaler, INHALE 2 PUFFS INTO THE LUNGS EVERY 4 HOURS AS NEEDED FOR WHEEZE OR FOR SHORTNESS OF BREATH   azelastine (ASTELIN) 0.1 % nasal spray, Place 1-2 sprays into both nostrils 2 (two) times  daily as needed for rhinitis.   benzonatate (TESSALON) 100 MG capsule, Take 1 capsule (100 mg total) by mouth 2 (two) times daily as needed for cough.   fluticasone (FLONASE) 50 MCG/ACT nasal spray, Place 2 sprays into both nostrils daily as needed for allergies or rhinitis.   Fluticasone-Umeclidin-Vilant (TRELEGY ELLIPTA) 200-62.5-25 MCG/ACT AEPB, Inhale 1 puff into the lungs daily.   montelukast (SINGULAIR) 10 MG tablet, Take 1 tablet (10 mg total) by mouth at bedtime.  Current Outpatient Medications (Analgesics):    HYDROcodone-acetaminophen (NORCO/VICODIN) 5-325 MG tablet, Take 1-2 tablets by mouth every 4 (four) hours as needed for moderate pain.  Current  Outpatient Medications (Hematological):    folic acid (FOLVITE) 287 MCG tablet, Take 400 mcg by mouth daily.  Current Outpatient Medications (Other):    Ascorbic Acid (VITAMIN C) 1000 MG tablet, Take 1,000 mg by mouth daily.   escitalopram (LEXAPRO) 20 MG tablet, Take 1 tablet (20 mg total) by mouth daily.   hydrOXYzine (VISTARIL) 25 MG capsule, Take 25 mg by mouth 2 (two) times daily.   pantoprazole (PROTONIX) 40 MG tablet, Take 1 tablet (40 mg total) by mouth daily.   primidone (MYSOLINE) 50 MG tablet, 2 in the AM, 1 in the PM   silodosin (RAPAFLO) 8 MG CAPS capsule, Take 1 capsule (8 mg total) by mouth daily.   tiZANidine (ZANAFLEX) 2 MG tablet, TAKE 1 TABLET BY MOUTH EVERYDAY AT BEDTIME (Patient not taking: Reported on 02/15/2021)   traZODone (DESYREL) 100 MG tablet, Take 1 tablet (100 mg total) by mouth at bedtime.   zolpidem (AMBIEN) 10 MG tablet, Take 1 tablet (10 mg total) by mouth at bedtime as needed for sleep.   Reviewed prior external information including notes and imaging from  primary care provider As well as notes that were available from care everywhere and other healthcare systems.  Past medical history, social, surgical and family history all reviewed in electronic medical record.  No pertanent information unless  stated regarding to the chief complaint.   Review of Systems:  No  visual changes, nausea, vomiting, diarrhea, constipation, dizziness, abdominal pain, skin rash, fevers, chills, night sweats, weight loss, swollen lymph nodes, joint swelling, chest pain, shortness of breath, mood changes. POSITIVE muscle aches, body aches, headache  Objective  Blood pressure 124/80, pulse 93, height 5\' 10"  (1.778 m), weight 240 lb (108.9 kg), SpO2 97 %.   General: No apparent distress alert and oriented x3 mood and affect normal, dressed appropriately.  HEENT: Pupils equal, extraocular movements intact  Respiratory: Patient's speak in full sentences and does not appear short of breath  Cardiovascular: No lower extremity edema, non tender, no erythema  Gait mild antalgic Neck exam does have significant limited range of motion.  Patient does have some postsurgical changes secondary to deep brain stimulator on the left side of the neck.  No erythema noted in that area.  Patient noted does have limited extension noted.  Crepitus noted.  Minimal sidebending bilaterally.  5 out of 5 strength noted noted of the upper extremities.  Patient does have tightness noted in the lower back as well as tightness with straight leg test but no true radicular symptoms.    Impression and Recommendations:    The above documentation has been reviewed and is accurate and complete Lyndal Pulley, DO

## 2021-03-15 ENCOUNTER — Ambulatory Visit: Payer: Medicare HMO | Admitting: Family Medicine

## 2021-03-15 ENCOUNTER — Other Ambulatory Visit: Payer: Self-pay

## 2021-03-15 ENCOUNTER — Other Ambulatory Visit: Payer: Self-pay | Admitting: Family Medicine

## 2021-03-15 VITALS — BP 124/80 | HR 93 | Ht 70.0 in | Wt 240.0 lb

## 2021-03-15 DIAGNOSIS — M5030 Other cervical disc degeneration, unspecified cervical region: Secondary | ICD-10-CM

## 2021-03-15 DIAGNOSIS — M5136 Other intervertebral disc degeneration, lumbar region: Secondary | ICD-10-CM

## 2021-03-15 DIAGNOSIS — J029 Acute pharyngitis, unspecified: Secondary | ICD-10-CM | POA: Diagnosis not present

## 2021-03-15 DIAGNOSIS — H6982 Other specified disorders of Eustachian tube, left ear: Secondary | ICD-10-CM | POA: Diagnosis not present

## 2021-03-15 DIAGNOSIS — J441 Chronic obstructive pulmonary disease with (acute) exacerbation: Secondary | ICD-10-CM | POA: Diagnosis not present

## 2021-03-15 MED ORDER — PREDNISONE 20 MG PO TABS: 40.0000 mg | ORAL_TABLET | Freq: Every day | ORAL | 0 refills | Status: DC

## 2021-03-15 NOTE — Patient Instructions (Addendum)
[place] [phone] ?Call Today ? ?Prednisone 40mg  daily for 5 days for [place]. Do not take ibuprofen or Advil while taking prednisone. ?See you again in 6 weeks ?

## 2021-03-15 NOTE — Assessment & Plan Note (Signed)
Significant arthritic changes noted and patient does have an MRI from March 2022 showing severe spinal stenosis at the C3-C4 level facet arthropathy in multiple other areas.  Patient does have a travel coming up and we will have him get an epidural at this time.  Likely no radicular symptoms.  Patient does describe a little bit of some tingling in his foot but did not think that this would correspond to it.  No significant weakness noted as well.  Patient knows if he does take the prednisone which I gave him a wait-and-see prescription because he will be traveling out of the [place] do not take any anti-inflammatories.  Patient will follow-up as stated.  Follow-up with me again in 6 to 8 weeks ?

## 2021-03-16 ENCOUNTER — Telehealth: Payer: Self-pay | Admitting: Pharmacist

## 2021-03-16 NOTE — Chronic Care Management (AMB) (Signed)
? ? ?Chronic Care Management ?Pharmacy Assistant  ? ?Name: Allen [name]  MRN: [id number] DOB: 27-Oct-1950 ? ?Allen [name] is an 71 y.o. year old male who presents for his initial CCM visit with the clinical pharmacist. ? ?Reason for Encounter: Chart prep for initial visit with Jeni Salles the clinical pharmacist on 03/20/2021 at 9:00 in the office.  ?  ?Conditions to be addressed/monitored: ?CHF, COPD, GERD, Asthma, and BPH, Ulcerative colitis, chronic respiratory failure, varicose veins, essential tremors. ? ? ?Recent office visits:  ?12/28/2020 Lelon Frohlich MD - Patient was seen for rash and additional issues. No medication changes. No follow up noted.  ? ?11/15/2020  Lelon Frohlich MD - Patient was seen for Gastroesophageal reflux disease, unspecified whether esophagitis present. Started Pantoprazole 40 mg daily. No follow up noted. ? ?Recent consult visits:  ?03/15/2021 Hulan Saas D) (sport medicine) - Patient was seen for degenerative disc disease, cervical. Started prednisone 40 mg daily for 5 days. Follow up in 6 weeks. ? ?03/10/2021 Gardiner Barefoot DPM (podiatry) - Patient was seen for Pain due to onychomycosis of toenails of both feet. No medication changes. Follow up in 3 months.  ? ?02/15/2021 Chesley Mires MD (pulmonary) - Patient was seen for obstructive sleep apnea. Started Zpac. Changed Prednisone 10 mg from daily to a taper.  ? ?01/30/2021 Wells Guiles Tat DO - Patient was seen for paresthesia of left foot and an additional issue. Changed Primidone 50 mg to 2 tablet in the AM and 1 tablet in the PM. No follow up noted.  ? ?11/25/2020 Gardiner Barefoot DPM (podiatry) - Patient was seen for Pain due to onychomycosis of toenails of both feet. No medication changes. Follow up in 3 months.  ? ?Hospital visits:  ?None ? ?Medications: ?Outpatient Encounter Medications as of 03/16/2021  ?Medication Sig Note  ? albuterol (PROVENTIL) (2.5 MG/3ML) 0.083% nebulizer solution Take 3 mLs (2.5 mg total)  by nebulization every 6 (six) hours. (Patient not taking: Reported on 02/15/2021) 08/03/2020: .  ? albuterol (VENTOLIN HFA) 108 (90 Base) MCG/ACT inhaler INHALE 2 PUFFS INTO THE LUNGS EVERY 4 HOURS AS NEEDED FOR WHEEZE OR FOR SHORTNESS OF BREATH   ? Ascorbic Acid (VITAMIN C) 1000 MG tablet Take 1,000 mg by mouth daily.   ? azelastine (ASTELIN) 0.1 % nasal spray Place 1-2 sprays into both nostrils 2 (two) times daily as needed for rhinitis.   ? benzonatate (TESSALON) 100 MG capsule Take 1 capsule (100 mg total) by mouth 2 (two) times daily as needed for cough.   ? escitalopram (LEXAPRO) 20 MG tablet Take 1 tablet (20 mg total) by mouth daily.   ? fluticasone (FLONASE) 50 MCG/ACT nasal spray Place 2 sprays into both nostrils daily as needed for allergies or rhinitis.   ? Fluticasone-Umeclidin-Vilant (TRELEGY ELLIPTA) 200-62.5-25 MCG/ACT AEPB Inhale 1 puff into the lungs daily.   ? folic acid (FOLVITE) 628 MCG tablet Take 400 mcg by mouth daily.   ? HYDROcodone-acetaminophen (NORCO/VICODIN) 5-325 MG tablet Take 1-2 tablets by mouth every 4 (four) hours as needed for moderate pain.   ? hydrOXYzine (VISTARIL) 25 MG capsule Take 25 mg by mouth 2 (two) times daily.   ? montelukast (SINGULAIR) 10 MG tablet Take 1 tablet (10 mg total) by mouth at bedtime.   ? pantoprazole (PROTONIX) 40 MG tablet Take 1 tablet (40 mg total) by mouth daily.   ? predniSONE (DELTASONE) 20 MG tablet Take 2 tablets (40 mg total) by mouth daily with breakfast.   ? primidone (MYSOLINE)  50 MG tablet 2 in the AM, 1 in the PM   ? silodosin (RAPAFLO) 8 MG CAPS capsule Take 1 capsule (8 mg total) by mouth daily.   ? tadalafil (CIALIS) 20 MG tablet Take 1 tablet (20 mg total) by mouth daily as needed for erectile dysfunction. (Patient taking differently: Take 10 mg by mouth daily as needed.)   ? tiZANidine (ZANAFLEX) 2 MG tablet TAKE 1 TABLET BY MOUTH EVERYDAY AT BEDTIME (Patient not taking: Reported on 02/15/2021)   ? traZODone (DESYREL) 100 MG tablet Take 1  tablet (100 mg total) by mouth at bedtime.   ? zolpidem (AMBIEN) 10 MG tablet Take 1 tablet (10 mg total) by mouth at bedtime as needed for sleep.   ? ?No facility-administered encounter medications on file as of 03/16/2021.  ?Fill History: ?ALBUTEROL HFA 90 MCG INHALER 11/11/2020 20  ? ?CELECOXIB 100 MG CAPSULE 10/18/2018 30  ? ?escitalopram 20 mg tablet 01/18/2021 90  ? ?famotidine 40 mg tablet 03/09/2020 1  ? ?Trelegy Ellipta 200 mcg-62.5 mcg-25 mcg powder for inhalation 01/15/2021 90  ? ?montelukast 10 mg tablet 01/18/2021 90  ? ?pantoprazole 40 mg tablet,delayed release 01/15/2021 90  ? ?primidone 50 mg tablet 03/01/2021 90  ? ?TADALAFIL 20MG       TAB 01/21/2021 90  ? ?zolpidem 10 mg tablet 01/21/2021 90  ? ?trazodone 100 mg tablet 01/18/2021 90  ? ?zolpidem 10 mg tablet 01/21/2021 90  ? ? ?Have you seen any other providers since your last visit? **Patient denies any visits ? ?Any changes in your medications or health? Patient denies any changes. ? ?Any side effects from any medications? Patient denies any side effects. ? ?Do you have an symptoms or problems not managed by your medications? Patient denies any issues not managed by medications. ? ?Any concerns about your health right now? Patient denies any concerns at this time.  ? ?Has your provider asked that you check blood pressure, blood sugar, or follow special diet at home? Patient denies.  ? ?Do you get any type of exercise on a regular basis? Patient walks on a treadmill daily ? ?Can you think of a goal you would like to reach for your health? Patient denies any goals at this time.  ? ?Do you have any problems getting your medications? Patient denies any issues getting medications.  ? ?Is there anything that you would like to discuss during the appointment? Patient denies any issues to discuss at this time.  ? ?Please bring medications and supplements to appointment ? ? ?Care Gaps: ?AWV - message sent to [name] ?Last BP - 124/80 on  03/15/2021 ? ?Star Rating Drugs: ?None ? ?[name] CMA  ?Clinical Pharmacist Assistant ?[phone] ? ?

## 2021-03-20 ENCOUNTER — Ambulatory Visit (INDEPENDENT_AMBULATORY_CARE_PROVIDER_SITE_OTHER): Payer: Medicare HMO | Admitting: Pharmacist

## 2021-03-20 VITALS — BP 122/70

## 2021-03-20 DIAGNOSIS — K219 Gastro-esophageal reflux disease without esophagitis: Secondary | ICD-10-CM

## 2021-03-20 DIAGNOSIS — F339 Major depressive disorder, recurrent, unspecified: Secondary | ICD-10-CM

## 2021-03-20 NOTE — Patient Instructions (Addendum)
Allen [name], ? ?It was great to get to meet you in person! Below is a summary of some of the topics we discussed.  ? ?Don't forget to: ?Go ahead and take Zyrtec or Claritin daily during allergy season ?Try Pepcid or Tums on the days your heartburn/acid reflux is worse ?Discuss other medications with Dr. Laverta Baltimore to replace the escitalopram such as Wellbutrin which doesn't have those side effects ? ?Please reach out to me if you have any questions or need anything before our follow up! ? ?Best, ?Maddie ? ?Jeni Salles, PharmD, BCACP ?Clinical Pharmacist ?Therapist, [facility] at [place] ?[phone] ? ? Visit Information ? ? Goals Addressed   ?None ?  ? ?Patient Care Plan: [place]  ?  ? ?Problem Identified: Problem: GERD, COPD, Depression, Allergic Rhinitis, and Tremor, Pain, Insomnia   ?  ? ?Long-Range Goal: Patient-Specific Goal   ?Start Date: 03/20/2021  ?Expected End Date: 03/21/2022  ?This Visit's Progress: On track  ?Priority: High  ?Note:   ?Current Barriers:  ?Unable to independently monitor therapeutic efficacy ? ?Pharmacist Clinical Goal(s):  ?Patient will achieve adherence to monitoring guidelines and medication adherence to achieve therapeutic efficacy through collaboration with PharmD and provider.  ? ?Interventions: ?1:1 collaboration with Isaac Bliss, Rayford Halsted, MD regarding development and update of comprehensive plan of care as evidenced by provider attestation and co-signature ?Inter-disciplinary care team collaboration (see longitudinal plan of care) ?Comprehensive medication review performed; medication list updated in electronic medical record ? ?COPD (Goal: control symptoms and prevent exacerbations) ?-Controlled ?-Current treatment  ?Trelegy 200-62.5-25 mcg/act 1 puff daily - Appropriate, Effective, Safe, Query accessible ?Albuterol HFA as needed - Appropriate, Effective, Safe, Accessible ?Albuterol nebulizer as needed - Appropriate, Effective, Safe, Accessible ?-Medications  previously tried: Breo  ?-Gold Grade: Gold 2 (FEV1 50-79%) ?-Current COPD Classification:  B (high sx, <2 exacerbations/yr) ?-MMRC/CAT score: 3 ?-Pulmonary function testing: 2018 ?-Exacerbations requiring treatment in last 6 months: none ?-Patient reports consistent use of maintenance inhaler ?-Frequency of rescue inhaler use: 1-2 times a day during allergy season ?-Counseled on Benefits of consistent maintenance inhaler use ?When to use rescue inhaler ?-Counseled on diet and exercise extensively ?Recommended to continue current medication ? ?Allergic rhinitis (Goal: minimize symptoms) ?-Uncontrolled ?-Current treatment  ?Montelukast 10 mg 1 tablet at bedtime - Appropriate, Query effective, Query Safe, Accessible ?Hydroxyzine 25 mg 1 tablet twice daily - Appropriate, Query effective, Safe, Accessible ?Azelastine 0.1% nasal spray 1-2 sprays in each nostril as needed - Appropriate, Effective, Safe, Accessible ?Flonase 50 mcg/act  2 sprays in both nostrils as needed  - Appropriate, Effective, Safe, Accessible ?-Medications previously tried: unknown  ?-Recommended trial without montelukast and reached out to pulmonologist to discuss. ?Recommended Zyrtec or Claritin daily during allergy season. ? ?Depression (Goal: minimize symptoms) ?-Controlled ?-Current treatment: ?Escitalopram 20 mg 1 tablet daily - Appropriate, Effective, Safe, Accessible ?-Medications previously tried/failed: unknown ?-PHQ9: 5 ?-GAD7: n/a ?-Educated on Benefits of medication for symptom control ?Benefits of cognitive-behavioral therapy with or without medication ?-Recommended discussing with Dr. Laverta Baltimore about switching to something like Wellbutrin which does not have sexual side effects. ? ?Insomnia (Goal: improve quality and quantity of sleep) ?-Controlled ?-Current treatment  ?Trazodone 100 mg 1 tablet at bedtime - Appropriate, Effective, Safe, Accessible ?Zolpidem  10 mg 1 tablet at bedtime as needed - Appropriate, Effective, Query Safe,  Accessible ?-Medications previously tried: none  ?-Counseled on practicing good sleep hygiene by setting a sleep schedule and maintaining it, avoid excessive napping, following a nightly routine, avoiding screen  time for 30-60 minutes before going to bed, and making the bedroom a cool, quiet and dark space ?Patient has tried to limit use of Ambien before without success. ? ?GERD (Goal: minimize symptoms) ?-Not ideally controlled ?-Current treatment  ?Pantoprazole 40 mg 1 tablet daily - Appropriate, Query effective, Safe, Accessible ?-Medications previously tried: unknown  ?-Counseled on non-pharmacologic management of symptoms such as elevating the head of your bed, avoiding eating 2-3 hours before bed, avoiding triggering foods such as acidic, spicy, or fatty foods, eating smaller meals, and wearing clothes that are loose around the waist ?Recommended Pepcid or Tums as needed for days that are worse. ? ?Tremor (Goal: minimize symptoms) ?-Uncontrolled ?-Current treatment  ?Primidone 50 mg 2 tablets in the morning 1 tablet in the evening - Appropriate, Query effective, Safe, Accessible ?-Medications previously tried: none  ?-Recommended discussing with neurology about trying another medication. ? ?BPH/overactive bladder (Goal: improve urinary flow and limit frequency) ?-Controlled ?-Current treatment  ?Silodosin 8 mg 1 capsule daily - Appropriate, Query effective, Safe, Accessible ?Myrbetriq 25 mg 1 tablet daily - Appropriate, Query effective, Safe, Query accessible ?-Medications previously tried: none  ?-Recommended to continue current medication ?Assessed patient finances. Patient would not qualify for patient assistance at this time. ? ?Health Maintenance ?-Vaccine gaps: Prevnar20 ?-Current therapy:  ?Folic acid 161 mg 1 tablet daily ?Tadalafil 10 mg 1 tablet daily ?Vitamin D 2000 units daily ?Vitamin B12 1000 mcg daily  ?-Educated on Cost vs benefit of each product must be carefully weighed by individual  consumer ?-Patient is satisfied with current therapy and denies issues ?-Recommended to continue current medication ? ?Patient Goals/Self-Care Activities ?Patient will:  ?- take medications as prescribed as evidenced by patient report and record review ?target a minimum of 150 minutes of moderate intensity exercise weekly ? ?Follow Up Plan: The care management team will reach out to the patient again over the next 30 days.  ? ?  ? ? ?Mr. Usman was given information about Chronic Care Management services today including:  ?CCM service includes personalized support from designated clinical staff supervised by his physician, including individualized plan of care and coordination with other care providers ?24/7 contact phone numbers for assistance for urgent and routine care needs. ?Standard insurance, coinsurance, copays and deductibles apply for chronic care management only during months in which we provide at least 20 minutes of these services. Most insurances cover these services at 100%, however patients may be responsible for any copay, coinsurance and/or deductible if applicable. This service may help you avoid the need for more expensive face-to-face services. ?Only one practitioner may furnish and bill the service in a calendar month. ?The patient may stop CCM services at any time (effective at the end of the month) by phone call to the office staff. ? ?Patient agreed to services and verbal consent obtained.  ? ?Patient verbalizes understanding of instructions and care plan provided today and agrees to view in [place]. Active MyChart status confirmed with patient.   ?The pharmacy team will reach out to the patient again over the next 30 days.  ? ?Viona Gilmore, RPH  ?

## 2021-03-20 NOTE — Progress Notes (Signed)
Chronic Care Management Pharmacy Note  03/20/2021 Name:  Allen [name] MRN:  [id number] DOB:  [birth year]  Summary: Pt reports ED side effects with several medications  Pt feels "overmedicated"  Recommendations/Changes made from today's visit: -Recommended discussing with Dr. Laverta Baltimore about switching to something like Wellbutrin which does not have sexual side effects -Recommended Zyrtec or Claritin daily during allergy season -Recommended trial without montelukast and reached out to pulmonologist to discuss -Recommended Pepcid or Tums as needed for days that are worse  Plan: COPD assessment in 2 months Follow up in 4-5 months  Subjective: Allen [name] is an 71 y.o. year old male who is a primary patient of Isaac Bliss, Rayford Halsted, MD.  The CCM team was consulted for assistance with disease management and care coordination needs.    Engaged with patient face to face for initial visit in response to provider referral for pharmacy case management and/or care coordination services.   Consent to Services:  The patient was given the following information about Chronic Care Management services today, agreed to services, and gave verbal consent: 1. CCM service includes personalized support from designated clinical staff supervised by the primary care provider, including individualized plan of care and coordination with other care providers 2. 24/7 contact phone numbers for assistance for urgent and routine care needs. 3. Service will only be billed when office clinical staff spend 20 minutes or more in a month to coordinate care. 4. Only one practitioner may furnish and bill the service in a calendar month. 5.The patient may stop CCM services at any time (effective at the end of the month) by phone call to the office staff. 6. The patient will be responsible for cost sharing (co-pay) of up to 20% of the service fee (after annual deductible is met). Patient agreed to services and consent  obtained.  Patient Care Team: Isaac Bliss, Rayford Halsted, MD as PCP - General (Internal Medicine) Buford Dresser, MD as PCP - Cardiology (Cardiology) Tat, Eustace Quail, DO as Consulting Physician (Neurology) Viona Gilmore, [facility] as Pharmacist (Pharmacist)  Recent office visits: 12/28/2020 Lelon Frohlich MD - Patient was seen for rash and additional issues. No medication changes. No follow up noted.    11/15/2020  Lelon Frohlich MD - Patient was seen for Gastroesophageal reflux disease, unspecified whether esophagitis present. Started Pantoprazole 40 mg daily. No follow up noted.  Recent consult visits: 03/15/2021 Hulan Saas D) (sport medicine) - Patient was seen for degenerative disc disease, cervical. Started prednisone 40 mg daily for 5 days. Follow up in 6 weeks.   03/10/2021 Gardiner Barefoot DPM (podiatry) - Patient was seen for Pain due to onychomycosis of toenails of both feet. No medication changes. Follow up in 3 months.    02/15/2021 Chesley Mires MD (pulmonary) - Patient was seen for obstructive sleep apnea. Started Zpak. Changed Prednisone 10 mg from daily to a taper.    01/30/2021 McKenney Tat DO - Patient was seen for paresthesia of left foot and an additional issue. Changed Primidone 50 mg to 2 tablet in the AM and 1 tablet in the PM. No follow up noted.    11/25/2020 Gardiner Barefoot DPM (podiatry) - Patient was seen for Pain due to onychomycosis of toenails of both feet. No medication changes. Follow up in 3 months.   Hospital visits: 03/15/21 Patient presented to [facility] for earache, sore throat and cough. Prescribed Augmentin x 7 days.   Objective:  Lab Results  Component Value Date   CREATININE 1.13 06/15/2020   BUN 23 06/15/2020   GFR 66.13 06/15/2020   NA 143 06/15/2020   K 4.5 06/15/2020   CALCIUM 9.5 06/15/2020   CO2 19 06/15/2020   GLUCOSE 88 06/15/2020    Lab Results  Component  Value Date/Time   HGBA1C 5.9 06/15/2020 02:04 PM   HGBA1C 5.6 05/06/2019 10:47 AM   GFR 66.13 06/15/2020 02:04 PM   GFR 75.81 03/08/2020 12:03 PM    Last diabetic Eye exam: No results found for: HMDIABEYEEXA  Last diabetic Foot exam: No results found for: HMDIABFOOTEX   Lab Results  Component Value Date   CHOL 174 06/15/2020   HDL 65.00 06/15/2020   LDLCALC 87 06/15/2020   TRIG 111.0 06/15/2020   CHOLHDL 3 06/15/2020    Hepatic Function Latest Ref Rng & Units 06/15/2020 03/08/2020 05/06/2019  Total Protein 6.0 - 8.3 g/dL 6.8 6.7 6.5  Albumin 3.5 - 5.2 g/dL 4.5 4.3 4.4  AST 0 - 37 U/L _0 ALT 0 - 53 U/L _1 Alk Phosphatase 39 - 117 U/L 61 52 50  Total Bilirubin 0.2 - 1.2 mg/dL 0.3 0.3 0.5    Lab Results  Component Value Date/Time   TSH 1.69 06/15/2020 02:04 PM   TSH 1.54 05/06/2019 10:47 AM    CBC Latest Ref Rng & Units 06/15/2020 03/08/2020 05/06/2019  WBC 4.0 - 10.5 K/uL 8.1 6.6 6.5  Hemoglobin 13.0 - 17.0 g/dL 13.3 11.9(L) 13.2  Hematocrit 39.0 - 52.0 % 40.1 35.5(L) 39.9  Platelets 150.0 - 400.0 K/uL 295.0 296.0 304.0    Lab Results  Component Value Date/Time   VD25OH 33.85 06/15/2020 02:04 PM   VD25OH 34 12/31/2019 11:56 AM   VD25OH 41 08/04/2019 10:11 AM   VD25OH 29.09 (L) 05/06/2019 10:47 AM    Clinical ASCVD: No  The 10-year ASCVD risk score (Arnett DK, et al., 2019) is: 14.3%   Values used to calculate the score:     Age: 71 years     Sex: Male     Is Non-Hispanic African American: No     Diabetic: No     Tobacco smoker: No     Systolic Blood Pressure: 778 mmHg     Is BP treated: No     HDL Cholesterol: 65 mg/dL     Total Cholesterol: 174 mg/dL    Depression screen [facility] 2/9 12/28/2020 06/15/2020 10/27/2019  Decreased Interest 0 0 0  Down, Depressed, Hopeless 0 2 1  PHQ - 2 Score 0 2 1  Altered sleeping 0 0 3  Tired, decreased energy _2 Change in appetite 0 0 -  Feeling bad or failure about yourself  0 0 0  Trouble concentrating 0 0 0   Moving slowly or fidgety/restless 3 0 0  Suicidal thoughts 0 0 0  PHQ-9 Score _3 Difficult doing work/chores Not difficult at all - Not difficult at all    [facility] Dyspnea Scale mMRC Score  04/27/2019 3  04/10/2019 0    Social History   Tobacco Use  Smoking Status Former   Packs/day: 3.00   Years: 20.00   Pack years: 60.00   Types: Cigarettes   Quit date: 1991   Years since quitting: 32.1  Smokeless Tobacco Never   BP Readings from Last 3 Encounters:  03/15/21 124/80  02/15/21 (!) 150/62  01/30/21 124/84   Pulse Readings from Last 3 Encounters:  03/15/21 93  02/15/21 76  01/30/21 77   Wt Readings from Last 3 Encounters:  03/15/21 240 lb (108.9 kg)  02/15/21 240 lb 6.4 oz (109 kg)  01/30/21 239 lb 12.8 oz (108.8 kg)   BMI Readings from Last 3 Encounters:  03/15/21 34.44 kg/m  02/15/21 34.49 kg/m  01/30/21 36.46 kg/m    Assessment/Interventions: Review of patient past medical history, allergies, medications, health status, including review of consultants reports, laboratory and other test data, was performed as part of comprehensive evaluation and provision of chronic care management services.   SDOH:  (Social Determinants of Health) assessments and interventions performed: Yes SDOH Interventions    Flowsheet Row Most Recent Value  SDOH Interventions   Financial Strain Interventions Intervention Not Indicated  Transportation Interventions Intervention Not Indicated      SDOH Screenings   Alcohol Screen: Not on file  Depression (PHQ2-9): Medium Risk   PHQ-2 Score: 5  Financial Resource Strain: Low Risk    Difficulty of Paying Living Expenses: Not hard at all  Food Insecurity: Not on file  Housing: Not on file  Physical Activity: Not on file  Social Connections: Not on file  Stress: Not on file  Tobacco Use: Medium Risk   Smoking Tobacco Use: Former   Smokeless Tobacco Use: Never   Passive Exposure: Not on file  Transportation Needs: No  Transportation Needs   Lack of Transportation (Medical): No   Lack of Transportation (Non-Medical): No   Patient reports his dog wakes him up at 7am every morning and he takes him outside. His wife is still wokrking from home as a therapist so he makes breakfast for them. He also walks on treadmill at the gym for 35 mins a day or goes to the park for a walk with the dog. He reports he walks daily.  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 His mind races at night with thoughts/concerns about the next day or the future. He doesn't know why because he feels like he has a good life. He does follow with a psychiatrist regularly.  Patient reports he has a lot of problems with arthritis and has some upcoming neck injections scheduled for tomorrow and he hopes this will help. He is also having a CAT scan on Friday of his lower back for his arthritis. 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 He has had a lot of hand and shoulder surgeries as well.  Patient reports his wife is turning 1 and they are going to [place] at the end of the month to celebrate. She is currently semi retired and she won't retire for a couple of years. He is not officially married to his wife, mostly for financial reasons.  Patient reports the prices of his medications are much better since switching from [place] to [place]. His Trelegy was $300 for a 3 months supply and now its $125. The Myrbetriq is also expensive.  Patient reports he has a lot of ED side effects from his medications. He knows the primidone and silodosin can make this worse and he wants to cut back on them. He feels like he is overmedicated and he is taking too many medications.   CCM Care Plan  No Known Allergies  Medications Reviewed Today     Reviewed by Viona Gilmore, [facility] (Pharmacist) on 03/20/21 at [phone]  Med  List Status: <None>    Medication Order Taking? Sig Documenting Provider Last Dose Status Informant  albuterol (PROVENTIL) (2.5 MG/3ML) 0.083% nebulizer solution [id number] Yes Take 3 mLs (2.5 mg total) by nebulization every 6 (six) hours. Chesley Mires, MD Taking Active Self           Med Note Burnard Leigh   Wed Aug 03, 2020  3:32 PM) .  albuterol (VENTOLIN HFA) 108 (90 Base) MCG/ACT inhaler [id number] Yes INHALE 2 PUFFS INTO THE LUNGS EVERY 4 HOURS AS NEEDED FOR WHEEZE OR FOR SHORTNESS OF BREATH Valentina Shaggy, MD Taking Active   azelastine (ASTELIN) 0.1 % nasal spray [id number] Yes Place 1-2 sprays into both nostrils 2 (two) times daily as needed for rhinitis. Valentina Shaggy, MD Taking Active   escitalopram (LEXAPRO) 20 MG tablet [id number] Yes Take 1 tablet (20 mg total) by mouth daily. Isaac Bliss, Rayford Halsted, MD Taking Active   fluticasone [facility]) 50 MCG/ACT nasal spray [id number] Yes Place 2 sprays into both nostrils daily as needed for allergies or rhinitis. Valentina Shaggy, MD Taking Active   Fluticasone-Umeclidin-Vilant [facility] ELLIPTA) 200-62.5-25 MCG/ACT AEPB [id number] Yes Inhale 1 puff into the lungs daily. Chesley Mires, MD Taking Active   folic acid (FOLVITE) 937 MCG tablet [id number] Yes Take 400 mcg by mouth daily. [provider] Taking Active Self  hydrOXYzine (VISTARIL) 25 MG capsule [id number] Yes Take 25 mg by mouth as needed. [provider] Taking Active   mirabegron ER (MYRBETRIQ) 25 MG TB24 tablet [id number] Yes Take 25 mg by mouth daily. [provider] Taking Active   montelukast (SINGULAIR) 10 MG tablet [id number] Yes Take 1 tablet (10 mg total) by mouth at bedtime. Chesley Mires, MD Taking Active   pantoprazole (PROTONIX) 40 MG tablet [id number] Yes Take 1 tablet (40 mg total) by mouth daily. Isaac Bliss, Rayford Halsted, MD Taking Active   predniSONE (DELTASONE) 20 MG tablet [id number] Yes Take 2 tablets (40 mg total) by mouth daily with breakfast.  Lyndal Pulley, DO Taking Active   primidone (MYSOLINE) 50 MG tablet [id number] Yes 2 in the AM, 1 in the PM Tat, Rebecca S, DO Taking Active   silodosin (RAPAFLO) 8 MG CAPS capsule [id number] Yes Take 1 capsule (8 mg total) by mouth daily. Robley Fries, MD Taking Active   tadalafil (CIALIS) 20 MG tablet [id number] Yes Take 1 tablet (20 mg total) by mouth daily as needed for erectile dysfunction.  Patient taking differently: Take 10 mg by mouth daily.   Isaac Bliss, Rayford Halsted, MD Taking Active Self  traZODone (DESYREL) 100 MG tablet [id number] Yes Take 1 tablet (100 mg total) by mouth at bedtime. Isaac Bliss, Rayford Halsted, MD Taking Active   zolpidem [facility]) 10 MG tablet [id number] Yes Take 1 tablet (10 mg total) by mouth at bedtime as needed for sleep. Isaac Bliss, Rayford Halsted, MD Taking Active             Patient Active Problem List   Diagnosis Date Noted   Chronic respiratory failure with hypoxia ([place]) 09/01/2020   BPH with obstruction/lower urinary tract symptoms 08/09/2020   S/P deep brain stimulator placement 03/25/2020   Tremor 03/25/2020   Pulmonary nodule less than 6 mm in diameter with high risk for malignant neoplasm 03/09/2020   Injury of axilla 03/03/2020   Body mass index (BMI) 32.0-32.9, adult 02/22/2020   Cervical radiculopathy 02/22/2020   Elevated blood-pressure reading, without diagnosis of hypertension 02/22/2020   Neck pain 02/22/2020  Greater trochanteric bursitis of right hip 11/18/2019   Degenerative disc disease, cervical 07/01/2019   Degenerative disc disease, lumbar 07/01/2019   Nonallopathic lesion of lumbar region 07/01/2019   Varicose veins of both lower extremities 06/23/2019   Chronic diastolic CHF (congestive heart failure) ([place]) 06/02/2019   Perennial allergic rhinitis probable nonallergic component 06/01/2019   Cough, persistent 06/01/2019   Vitamin D deficiency 05/07/2019   Moderate persistent asthma without complication 39/03/90    Allergic rhinitis 04/10/2019   Shortness of breath 04/10/2019   GERD (gastroesophageal reflux disease)    COPD (chronic obstructive pulmonary disease) (HCC)    OSA treated with BiPAP    Right carpal tunnel syndrome    Essential tremor    Insomnia    ED (erectile dysfunction)    Ulcerative colitis ([place]) 06/30/2014    Immunization History  Administered Date(s) Administered   Fluad Quad(high Dose 65+) 09/18/2018, 11/10/2019   Influenza, High Dose Seasonal PF 11/01/2020   PFIZER(Purple Top)SARS-COV-2 Vaccination 02/20/2019, 03/17/2019, 06/02/2019, 10/03/2019, 11/01/2020   Pneumococcal Polysaccharide-23 01/08/2015   Pneumococcal-Unspecified 12/15/2017   Tdap 09/17/2016   Zoster Recombinat (Shingrix) 06/06/2019, 09/05/2019    Conditions to be addressed/monitored:  GERD, COPD, Depression, BPH, Allergic Rhinitis, and Tremor, Insomnia  Care Plan : CCM Pharmacy Care Plan  Updates made by Viona Gilmore, [place] since 03/20/2021 12:00 AM     Problem: Problem: GERD, COPD, Depression, Allergic Rhinitis, and Tremor, Pain, Insomnia      Long-Range Goal: Patient-Specific Goal   Start Date: 03/20/2021  Expected End Date: 03/21/2022  This Visit's Progress: On track  Priority: High  Note:   Current Barriers:  Unable to independently monitor therapeutic efficacy  Pharmacist Clinical Goal(s):  Patient will achieve adherence to monitoring guidelines and medication adherence to achieve therapeutic efficacy through collaboration with PharmD and provider.   Interventions: 1:1 collaboration with Isaac Bliss, Rayford Halsted, MD regarding development and update of comprehensive plan of care as evidenced by provider attestation and co-signature Inter-disciplinary care team collaboration (see longitudinal plan of care) Comprehensive medication review performed; medication list updated in electronic medical record  COPD (Goal: control symptoms and prevent exacerbations) -Controlled -Current treatment   Trelegy 200-62.5-25 mcg/act 1 puff daily - Appropriate, Effective, Safe, Query accessible Albuterol HFA as needed - Appropriate, Effective, Safe, Accessible Albuterol nebulizer as needed - Appropriate, Effective, Safe, Accessible -Medications previously tried: Breo  -Gold Grade: Gold 2 (FEV1 50-79%) -Current COPD Classification:  B (high sx, <2 exacerbations/yr) -MMRC/CAT score: 3 -Pulmonary function testing: 2018 -Exacerbations requiring treatment in last 6 months: none -Patient reports consistent use of maintenance inhaler -Frequency of rescue inhaler use: 1-2 times a day during allergy season -Counseled on Benefits of consistent maintenance inhaler use When to use rescue inhaler -Counseled on diet and exercise extensively Recommended to continue current medication  Allergic rhinitis (Goal: minimize symptoms) -Uncontrolled -Current treatment  Montelukast 10 mg 1 tablet at bedtime - Appropriate, Query effective, Query Safe, Accessible Hydroxyzine 25 mg 1 tablet twice daily - Appropriate, Query effective, Safe, Accessible Azelastine 0.1% nasal spray 1-2 sprays in each nostril as needed - Appropriate, Effective, Safe, Accessible Flonase 50 mcg/act  2 sprays in both nostrils as needed  - Appropriate, Effective, Safe, Accessible -Medications previously tried: unknown  -Recommended trial without montelukast and reached out to pulmonologist to discuss. Recommended Zyrtec or Claritin daily during allergy season.  Depression (Goal: minimize symptoms) -Controlled -Current treatment: Escitalopram 20 mg 1 tablet daily - Appropriate, Effective, Safe, Accessible -Medications previously tried/failed: unknown -PHQ9: 5 -GAD7:  n/a -Educated on Benefits of medication for symptom control Benefits of cognitive-behavioral therapy with or without medication -Recommended discussing with Dr. Laverta Baltimore about switching to something like Wellbutrin which does not have sexual side effects.  Insomnia (Goal:  improve quality and quantity of sleep) -Controlled -Current treatment  Trazodone 100 mg 1 tablet at bedtime - Appropriate, Effective, Safe, Accessible Zolpidem  10 mg 1 tablet at bedtime as needed - Appropriate, Effective, Query Safe, Accessible -Medications previously tried: none  -Counseled on practicing good sleep hygiene by setting a sleep schedule and maintaining it, avoid excessive napping, following a nightly routine, avoiding screen time for 30-60 minutes before going to bed, and making the bedroom a cool, quiet and dark space Patient has tried to limit use of Ambien before without success.  GERD (Goal: minimize symptoms) -Not ideally controlled -Current treatment  Pantoprazole 40 mg 1 tablet daily - Appropriate, Query effective, Safe, Accessible -Medications previously tried: unknown  -Counseled on non-pharmacologic management of symptoms such as elevating the head of your bed, avoiding eating 2-3 hours before bed, avoiding triggering foods such as acidic, spicy, or fatty foods, eating smaller meals, and wearing clothes that are loose around the waist Recommended Pepcid or Tums as needed for days that are worse.  Tremor (Goal: minimize symptoms) -Uncontrolled -Current treatment  Primidone 50 mg 2 tablets in the morning 1 tablet in the evening - Appropriate, Query effective, Safe, Accessible -Medications previously tried: none  -Recommended discussing with neurology about trying another medication.  BPH/overactive bladder (Goal: improve urinary flow and limit frequency) -Controlled -Current treatment  Silodosin 8 mg 1 capsule daily - Appropriate, Query effective, Safe, Accessible Myrbetriq 25 mg 1 tablet daily - Appropriate, Query effective, Safe, Query accessible -Medications previously tried: none  -Recommended to continue current medication Assessed patient finances. Patient would not qualify for patient assistance at this time.  Health Maintenance -Vaccine gaps:  Prevnar20 -Current therapy:  Folic acid 962 mg 1 tablet daily Tadalafil 10 mg 1 tablet daily Vitamin D 2000 units daily Vitamin B12 1000 mcg daily  -Educated on Cost vs benefit of each product must be carefully weighed by individual consumer -Patient is satisfied with current therapy and denies issues -Recommended to continue current medication  Patient Goals/Self-Care Activities Patient will:  - take medications as prescribed as evidenced by patient report and record review target a minimum of 150 minutes of moderate intensity exercise weekly  Follow Up Plan: The care management team will reach out to the patient again over the next 30 days.         Medication Assistance:  Patient does not qualify for any patient assistance programs at this time.  Compliance/Adherence/Medication fill history: Care Gaps: Hep C screening, Prevnar 20  Last BP - 124/80 on 03/15/2021  Star-Rating Drugs: None  Patient's preferred pharmacy is:  [id number] [place] [place] - [address]Phone: [phone]: [phone] [facility]Phone: [phone]: [phone] [id number] [address] [place]one: [phone]: [phone] [id number] [address] [place] [address] [facility] [facility]hone: [phone]: [phone] (Now [facility]) - [place]  [place] [address] [place] [address] Phone: [phone] Fax: [phone]  [place], [place] [address] Phone:  [phone] Fax: [place], [place] [facility] [address] AT [facility] OF [address] [place] [place] [address] Phone: [phone] Fax: [phone]  Uses pill box? Yes - wife fills them once a month ([place] reminders and wife) Pt endorses 100% compliance  We discussed: Current pharmacy is preferred with insurance plan and patient is satisfied with pharmacy services Patient decided to: Continue current medication management strategy  Care Plan and Follow Up Patient Decision:  Patient agrees to Care Plan and Follow-up.  Plan: The care management team will reach out to the patient again over the next 30 days.  Jeni Salles, PharmD, [place] Pharmacist [place] [facility] [place]

## 2021-03-21 ENCOUNTER — Ambulatory Visit
Admission: RE | Admit: 2021-03-21 | Discharge: 2021-03-21 | Disposition: A | Discharge status: Home / Self Care | Payer: Medicare HMO | Source: Ambulatory Visit | Attending: Family Medicine | Admitting: Family Medicine

## 2021-03-21 ENCOUNTER — Other Ambulatory Visit: Payer: Self-pay

## 2021-03-21 DIAGNOSIS — M47812 Spondylosis without myelopathy or radiculopathy, cervical region: Secondary | ICD-10-CM | POA: Diagnosis not present

## 2021-03-21 DIAGNOSIS — M5030 Other cervical disc degeneration, unspecified cervical region: Secondary | ICD-10-CM

## 2021-03-21 MED ORDER — IOPAMIDOL (ISOVUE-M 300) INJECTION 61%
1.0000 mL | Freq: Once | INTRAMUSCULAR | Status: AC
  Administered 2021-03-21: 1 mL via EPIDURAL

## 2021-03-21 MED ORDER — TRIAMCINOLONE ACETONIDE 40 MG/ML IJ SUSP (RADIOLOGY)
60.0000 mg | Freq: Once | INTRAMUSCULAR | Status: AC
  Administered 2021-03-21: 60 mg via EPIDURAL

## 2021-03-21 NOTE — Discharge Instructions (Signed)

## 2021-03-24 ENCOUNTER — Ambulatory Visit
Admission: RE | Admit: 2021-03-24 | Discharge: 2021-03-24 | Disposition: A | Discharge status: Home / Self Care | Payer: Medicare HMO | Source: Ambulatory Visit | Attending: Family Medicine | Admitting: Family Medicine

## 2021-03-24 ENCOUNTER — Other Ambulatory Visit: Payer: Self-pay

## 2021-03-24 DIAGNOSIS — M5126 Other intervertebral disc displacement, lumbar region: Secondary | ICD-10-CM | POA: Diagnosis not present

## 2021-03-24 DIAGNOSIS — M4316 Spondylolisthesis, lumbar region: Secondary | ICD-10-CM | POA: Diagnosis not present

## 2021-03-24 DIAGNOSIS — M5136 Other intervertebral disc degeneration, lumbar region: Secondary | ICD-10-CM

## 2021-03-24 MED ORDER — IOPAMIDOL (ISOVUE-M 200) INJECTION 41%
20.0000 mL | Freq: Once | INTRAMUSCULAR | Status: AC
  Administered 2021-03-24: 20 mL via INTRATHECAL

## 2021-03-24 MED ORDER — ONDANSETRON HCL 4 MG/2ML IJ SOLN: 4.0000 mg | Freq: Once | INTRAMUSCULAR | Status: DC | PRN

## 2021-03-24 MED ORDER — DIAZEPAM 5 MG PO TABS
5.0000 mg | ORAL_TABLET | Freq: Once | ORAL | Status: AC
  Administered 2021-03-24: 5 mg via ORAL

## 2021-03-24 MED ORDER — MEPERIDINE HCL 50 MG/ML IJ SOLN: 50.0000 mg | Freq: Once | INTRAMUSCULAR | Status: DC | PRN

## 2021-03-24 NOTE — Discharge Instructions (Signed)

## 2021-03-27 DIAGNOSIS — H353131 Nonexudative age-related macular degeneration, bilateral, early dry stage: Secondary | ICD-10-CM | POA: Diagnosis not present

## 2021-03-27 DIAGNOSIS — Z961 Presence of intraocular lens: Secondary | ICD-10-CM | POA: Diagnosis not present

## 2021-03-29 DIAGNOSIS — R3913 Splitting of urinary stream: Secondary | ICD-10-CM | POA: Diagnosis not present

## 2021-03-29 DIAGNOSIS — R3915 Urgency of urination: Secondary | ICD-10-CM | POA: Diagnosis not present

## 2021-03-29 DIAGNOSIS — N401 Enlarged prostate with lower urinary tract symptoms: Secondary | ICD-10-CM | POA: Diagnosis not present

## 2021-03-29 DIAGNOSIS — R3916 Straining to void: Secondary | ICD-10-CM | POA: Diagnosis not present

## 2021-04-04 ENCOUNTER — Telehealth: Payer: Self-pay | Admitting: Pharmacist

## 2021-04-04 DIAGNOSIS — W010XXA Fall on same level from slipping, tripping and stumbling without subsequent striking against object, initial encounter: Secondary | ICD-10-CM | POA: Diagnosis not present

## 2021-04-04 DIAGNOSIS — G8911 Acute pain due to trauma: Secondary | ICD-10-CM | POA: Diagnosis not present

## 2021-04-04 DIAGNOSIS — J449 Chronic obstructive pulmonary disease, unspecified: Secondary | ICD-10-CM | POA: Diagnosis not present

## 2021-04-04 DIAGNOSIS — R079 Chest pain, unspecified: Secondary | ICD-10-CM | POA: Diagnosis not present

## 2021-04-04 DIAGNOSIS — M25461 Effusion, right knee: Secondary | ICD-10-CM | POA: Diagnosis not present

## 2021-04-04 DIAGNOSIS — S80211A Abrasion, right knee, initial encounter: Secondary | ICD-10-CM | POA: Diagnosis not present

## 2021-04-04 DIAGNOSIS — M4802 Spinal stenosis, cervical region: Secondary | ICD-10-CM | POA: Diagnosis not present

## 2021-04-04 DIAGNOSIS — S0003XA Contusion of scalp, initial encounter: Secondary | ICD-10-CM | POA: Diagnosis not present

## 2021-04-04 DIAGNOSIS — W1809XA Striking against other object with subsequent fall, initial encounter: Secondary | ICD-10-CM | POA: Diagnosis not present

## 2021-04-04 DIAGNOSIS — Y92017 Garden or yard in single-family (private) house as the place of occurrence of the external cause: Secondary | ICD-10-CM | POA: Diagnosis not present

## 2021-04-04 DIAGNOSIS — K219 Gastro-esophageal reflux disease without esophagitis: Secondary | ICD-10-CM | POA: Diagnosis not present

## 2021-04-04 DIAGNOSIS — S0083XA Contusion of other part of head, initial encounter: Secondary | ICD-10-CM | POA: Diagnosis not present

## 2021-04-04 DIAGNOSIS — S61001A Unspecified open wound of right thumb without damage to nail, initial encounter: Secondary | ICD-10-CM | POA: Diagnosis not present

## 2021-04-04 DIAGNOSIS — S60311A Abrasion of right thumb, initial encounter: Secondary | ICD-10-CM | POA: Diagnosis not present

## 2021-04-04 DIAGNOSIS — Y9289 Other specified places as the place of occurrence of the external cause: Secondary | ICD-10-CM | POA: Diagnosis not present

## 2021-04-04 DIAGNOSIS — S0081XA Abrasion of other part of head, initial encounter: Secondary | ICD-10-CM | POA: Diagnosis not present

## 2021-04-04 DIAGNOSIS — Z9682 Presence of neurostimulator: Secondary | ICD-10-CM | POA: Diagnosis not present

## 2021-04-04 NOTE — Telephone Encounter (Signed)
Called patient for follow up from CCM visit. Patient had inquired about stopping montelukast. Reached out to Dr. Halford Chessman to see if he was ok with this. Per Dr. Halford Chessman patient is ok to stop this medication as he did not feel it was working. Removed from medication list. ?

## 2021-04-12 ENCOUNTER — Ambulatory Visit: Payer: Medicare HMO | Admitting: Dermatology

## 2021-04-14 DIAGNOSIS — F339 Major depressive disorder, recurrent, unspecified: Secondary | ICD-10-CM

## 2021-04-14 DIAGNOSIS — J449 Chronic obstructive pulmonary disease, unspecified: Secondary | ICD-10-CM

## 2021-04-14 DIAGNOSIS — N400 Enlarged prostate without lower urinary tract symptoms: Secondary | ICD-10-CM

## 2021-04-24 NOTE — Progress Notes (Signed)
?Allen [name] D.O. ?[facility] ?[place] ?Phone: [phone] ?Subjective:   ?I, Allen [name], am serving as a scribe for Dr. Hulan Saas. ? ?I'm seeing this patient by the request  of:  Isaac Bliss, Rayford Halsted, MD ? ?CC: Neck and back pain follow-up ? ?[id number]  ?03/15/2021 ?Significant arthritic changes noted and patient does have an MRI from March 2022 showing severe spinal stenosis at the C3-C4 level facet arthropathy in multiple other areas.  Patient does have a travel coming up and we will have him get an epidural at this time.  Likely no radicular symptoms.  Patient does describe a little bit of some tingling in his foot but did not think that this would correspond to it.  No significant weakness noted as well.  Patient knows if he does take the prednisone which I gave him a wait-and-see prescription because [place] be [place] do not take any anti-inflammatories.  Patient will follow-up as stated.  Follow-up with me again in 6 to 8 weeks ? ?Update 04/25/2021 ?Allen [name] is a 71 y.o. male coming in with complaint of lumbar spine pain. Epidural on 03/21/2021. Patient states that his left side of his neck is still tight, and is wanting to follow up on the CT scan. Patient states he thinks he needs another Epidural for the neck.  Patient states that it is affecting daily activities.  Feels like it is getting worse over the course of time.  In addition to this affecting sleep from time to time.  Also patient feels like he is having difficulty with balance. ? ?Lumbar myelogram 03/24/2021 ?IMPRESSION: ?1. Advanced lumbar disc and facet degeneration without significant ?spinal stenosis. ?2. Moderate lateral recess and right neural foraminal stenosis at ?L3-4. ?3. Mild lateral recess and mild to moderate neural foraminal ?stenosis at L4-5 and L5-S1. ?4. Aortic Atherosclerosis (ICD10-I70.0). ? ?Lumbar CT 03/24/2021 ?IMPRESSION: ?1. Advanced lumbar  disc and facet degeneration without significant ?spinal stenosis. ?2. Moderate lateral recess and right neural foraminal stenosis at ?L3-4. ?3. Mild lateral recess and mild to moderate neural foraminal ?stenosis at L4-5 and L5-S1. ?4. Aortic Atherosclerosis (ICD10-I70.0). ?  ?  ? ?Past Medical History:  ?Diagnosis Date  ? Anxiety   ? Arthritis   ? neck -  ? BPH with obstruction/lower urinary tract symptoms   ? urologist-- dr pace  ? Centrilobular emphysema ([place])   ? followed by dr Halford Chessman  ? 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([place])   ? followed by dr Halford Chessman  ? Cough 07/2020  ? covid residual, no congestion  ? DOE (dyspnea on exertion)   ? due to copd/ emphysema  ? ED (erectile dysfunction)   ? Essential tremor   ? neurologist--- dr tat----  bilateral upper extremities,  s/p DPS 12/ 2016  ? GERD (gastroesophageal reflux disease)   ? History of 2019 novel coronavirus disease (COVID-19) 07/18/2020  ? per pt postive home test 07-18-2020 w/ mild  symptoms per pt; pt stated had urgent care visit 07-24-2020 for sob/ cough with negative cxr (in care everywhere) ;  follow up pcp visit 08-03-2020 in epic  ? Insomnia   ? Mixed simple and mucopurulent chronic bronchitis ([place])   ? OSA (obstructive sleep apnea)   ? followed by dr Halford Chessman---- no longer does uses bipap, intolerant --- pt prescriped  Oxygen  2 L oxygen via [place] while sleeping  (study in epic 04/ 2017 AHI 40)  ? Perennial allergic rhinitis   ?  Pulmonary nodules   ? followed by dr Halford Chessman  ? S/P deep brain stimulator placement 12/2014  ? followed by neurology-----IPG change 01-21-2020 for end-of life battery  (pt has a control)  ? Varicose vein of leg   ? per pt has had 3 procedure's last one 2006  ? Wears dentures   ? upper  ? ?Past Surgical History:  ?Procedure Laterality Date  ? ANKLE SURGERY Right 2020  ? tendon repair  ? CARPAL TUNNEL RELEASE Left 2019  ? CATARACT EXTRACTION W/ INTRAOCULAR LENS IMPLANT Bilateral 2020  ? COLONOSCOPY    ? DEEP BRAIN STIMULATOR PLACEMENT Left 2016  ? INGUINAL  HERNIA REPAIR Right 2007  ? ROTATOR CUFF REPAIR Right 2014  ? SUBTHALAMIC STIMULATOR BATTERY REPLACEMENT N/A 01/21/2020  ? Procedure: Deep brain stimulator battery replacement;  Surgeon: Erline Levine, MD;  Location: [facility];  Service: Neurosurgery;  Laterality: N/A;  ? TONSILLECTOMY AND ADENOIDECTOMY    ? age 4  ? TOTAL SHOULDER REPLACEMENT Left 2016  ? TRANSURETHRAL RESECTION OF PROSTATE N/A 08/09/2020  ? Procedure: TRANSURETHRAL RESECTION OF THE PROSTATE (TURP), BLADDER BIOPSY AND FULGERATION;  Surgeon: Robley Fries, MD;  Location: [facility];  Service: Urology;  Laterality: N/A;  ? VEIN SURGERY Left 2006  ? varicose veins  ? ?Social History  ? ?Socioeconomic History  ? Marital status: Married  ?  Spouse name: Not on file  ? Number of children: 3  ? Years of education: Not on file  ? Highest education level: Associate degree: occupational, Hotel manager, or vocational program  ?Occupational History  ? Not on file  ?Tobacco Use  ? Smoking status: Former  ?  Packs/day: 3.00  ?  Years: 20.00  ?  Pack years: 60.00  ?  Types: Cigarettes  ?  Quit date: 82  ?  Years since quitting: 32.2  ? Smokeless tobacco: Never  ?Vaping Use  ? Vaping Use: Never used  ?Substance and Sexual Activity  ? Alcohol use: Not Currently  ?  Comment: occassionally  ? Drug use: Never  ? Sexual activity: Yes  ?Other Topics Concern  ? Not on file  ?Social History Narrative  ? Not on file  ? ?Social Determinants of Health  ? ?Financial Resource Strain: Low Risk   ? Difficulty of Paying Living Expenses: Not hard at all  ?Food Insecurity: Not on file  ?Transportation Needs: No Transportation Needs  ? Lack of Transportation (Medical): No  ? Lack of Transportation (Non-Medical): No  ?Physical Activity: Not on file  ?Stress: Not on file  ?Social Connections: Not on file  ? ?No Known Allergies ?Family History  ?Problem Relation Age of Onset  ? COPD Mother   ? Asthma Father   ? Rectal cancer Sister   ? Healthy Child   ? Urticaria Neg Hx    ? Immunodeficiency Neg Hx   ? Eczema Neg Hx   ? Atopy Neg Hx   ? Angioedema Neg Hx   ? Allergic rhinitis Neg Hx   ? ? ?Current Outpatient Medications (Endocrine & Metabolic):  ?  predniSONE (DELTASONE) 20 MG tablet, Take 2 tablets (40 mg total) by mouth daily with breakfast. ? ?Current Outpatient Medications (Cardiovascular):  ?  tadalafil (CIALIS) 20 MG tablet, Take 1 tablet (20 mg total) by mouth daily as needed for erectile dysfunction. (Patient taking differently: Take 10 mg by mouth daily.) ? ?Current Outpatient Medications (Respiratory):  ?  albuterol (PROVENTIL) (2.5 MG/3ML) 0.083% nebulizer solution, Take 3 mLs (2.5 mg  total) by nebulization every 6 (six) hours. ?  albuterol (VENTOLIN HFA) 108 (90 Base) MCG/ACT inhaler, INHALE 2 PUFFS INTO THE LUNGS EVERY 4 HOURS AS NEEDED FOR WHEEZE OR FOR SHORTNESS OF BREATH ?  azelastine (ASTELIN) 0.1 % nasal spray, Place 1-2 sprays into both nostrils 2 (two) times daily as needed for rhinitis. ?  fluticasone (FLONASE) 50 MCG/ACT nasal spray, Place 2 sprays into both nostrils daily as needed for allergies or rhinitis. ?  Fluticasone-Umeclidin-Vilant (TRELEGY ELLIPTA) 200-62.5-25 MCG/ACT AEPB, Inhale 1 puff into the lungs daily. ? ? ?Current Outpatient Medications (Hematological):  ?  folic acid (FOLVITE) 712 MCG tablet, Take 400 mcg by mouth daily. ? ?Current Outpatient Medications (Other):  ?  escitalopram (LEXAPRO) 20 MG tablet, Take 1 tablet (20 mg total) by mouth daily. ?  hydrOXYzine (VISTARIL) 25 MG capsule, Take 25 mg by mouth as needed. ?  mirabegron ER (MYRBETRIQ) 25 MG TB24 tablet, Take 25 mg by mouth daily. ?  pantoprazole (PROTONIX) 40 MG tablet, Take 1 tablet (40 mg total) by mouth daily. ?  primidone (MYSOLINE) 50 MG tablet, 2 in the AM, 1 in the PM ?  silodosin (RAPAFLO) 8 MG CAPS capsule, Take 1 capsule (8 mg total) by mouth daily. ?  traZODone (DESYREL) 100 MG tablet, Take 1 tablet (100 mg total) by mouth at bedtime. ?  zolpidem (AMBIEN) 10 MG tablet,  Take 1 tablet (10 mg total) by mouth at bedtime as needed for sleep. ? ? ?Reviewed prior external information including notes and imaging from  ?primary care provider ?As well as notes that were available

## 2021-04-25 ENCOUNTER — Ambulatory Visit (INDEPENDENT_AMBULATORY_CARE_PROVIDER_SITE_OTHER): Payer: Medicare HMO | Admitting: Family Medicine

## 2021-04-25 ENCOUNTER — Encounter: Payer: Self-pay | Admitting: Family Medicine

## 2021-04-25 VITALS — BP 132/72 | HR 94 | Ht 70.0 in | Wt 249.0 lb

## 2021-04-25 DIAGNOSIS — M999 Biomechanical lesion, unspecified: Secondary | ICD-10-CM

## 2021-04-25 DIAGNOSIS — M5136 Other intervertebral disc degeneration, lumbar region: Secondary | ICD-10-CM | POA: Diagnosis not present

## 2021-04-25 DIAGNOSIS — M5412 Radiculopathy, cervical region: Secondary | ICD-10-CM

## 2021-04-25 DIAGNOSIS — R2689 Other abnormalities of gait and mobility: Secondary | ICD-10-CM

## 2021-04-25 DIAGNOSIS — M5030 Other cervical disc degeneration, unspecified cervical region: Secondary | ICD-10-CM | POA: Diagnosis not present

## 2021-04-25 DIAGNOSIS — M5416 Radiculopathy, lumbar region: Secondary | ICD-10-CM | POA: Diagnosis not present

## 2021-04-25 NOTE — Assessment & Plan Note (Signed)
Also significant arthritic changes.  Chronic problem with exacerbation.  Patient is sick of being in pain and wants to know if there is any surgical intervention.  Patient has significant facet arthropathy may have some nerve impingement noted but difficult to assess for sure.  Patient is going to be scheduled for an epidural but does want to be referred to neurosurgery to discuss other treatment options. ?

## 2021-04-25 NOTE — Patient Instructions (Addendum)
Good to see you  ?Epidural ordered for C7-T1 and L5-S1 ?Referral [place] at neurosurgery ?PT referral balance and coordination at [address] ?Lets follow up 6 weeks after your injection  ? ?

## 2021-04-25 NOTE — Assessment & Plan Note (Signed)
Significant arthritic changes noted.  Patient did respond well prior to the epidural.  Feels like the pain is starting to come back again.  We will repeat an epidural of the cervical spine.  Patient also does want to discuss with neurosurgery about other possibilities.  Patient does not know if he needs surgery or not.  At this point patient continues to have discomfort and pain.  Increase activity slowly.  Follow-up again in 6 to 8 weeks after injection  ?

## 2021-04-26 ENCOUNTER — Ambulatory Visit: Payer: Medicare HMO

## 2021-04-26 DIAGNOSIS — G4733 Obstructive sleep apnea (adult) (pediatric): Secondary | ICD-10-CM

## 2021-04-28 ENCOUNTER — Ambulatory Visit
Admission: RE | Admit: 2021-04-28 | Discharge: 2021-04-28 | Disposition: A | Discharge status: Home / Self Care | Payer: Medicare HMO | Source: Ambulatory Visit | Attending: Family Medicine | Admitting: Family Medicine

## 2021-04-28 DIAGNOSIS — M5416 Radiculopathy, lumbar region: Secondary | ICD-10-CM

## 2021-04-28 DIAGNOSIS — M4727 Other spondylosis with radiculopathy, lumbosacral region: Secondary | ICD-10-CM | POA: Diagnosis not present

## 2021-04-28 MED ORDER — IOPAMIDOL (ISOVUE-M 200) INJECTION 41%
1.0000 mL | Freq: Once | INTRAMUSCULAR | Status: AC
  Administered 2021-04-28: 1 mL via EPIDURAL

## 2021-04-28 MED ORDER — METHYLPREDNISOLONE ACETATE 40 MG/ML INJ SUSP (RADIOLOG
80.0000 mg | Freq: Once | INTRAMUSCULAR | Status: AC
Start: 2021-04-28 — End: 2021-04-28
  Administered 2021-04-28: 80 mg via EPIDURAL

## 2021-04-28 NOTE — Discharge Instructions (Signed)

## 2021-05-01 ENCOUNTER — Telehealth: Payer: Self-pay | Admitting: Pulmonary Disease

## 2021-05-01 DIAGNOSIS — G4733 Obstructive sleep apnea (adult) (pediatric): Secondary | ICD-10-CM | POA: Diagnosis not present

## 2021-05-01 NOTE — Telephone Encounter (Signed)
Called and went over results with patient. He voiced understanding. Made him an appt for 05/08/21 with [facility] APP. Nothing further needed.  ?

## 2021-05-01 NOTE — Telephone Encounter (Signed)
HST 04/26/21 >> AHI 8.1, SpO2 low 82%, spent 149 min with SpO2 < 89% ? ? ?Please inform him that his sleep study shows mild obstructive sleep apnea and low oxygen.  Please arrange for ROV with me or NP to discuss treatment options. ? ? ? ?

## 2021-05-03 ENCOUNTER — Encounter: Payer: Self-pay | Admitting: Dermatology

## 2021-05-03 ENCOUNTER — Ambulatory Visit: Payer: Medicare HMO | Admitting: Dermatology

## 2021-05-03 DIAGNOSIS — D3611 Benign neoplasm of peripheral nerves and autonomic nervous system of face, head, and neck: Secondary | ICD-10-CM | POA: Diagnosis not present

## 2021-05-03 DIAGNOSIS — L738 Other specified follicular disorders: Secondary | ICD-10-CM | POA: Diagnosis not present

## 2021-05-03 DIAGNOSIS — D3610 Benign neoplasm of peripheral nerves and autonomic nervous system, unspecified: Secondary | ICD-10-CM

## 2021-05-03 DIAGNOSIS — D485 Neoplasm of uncertain behavior of skin: Secondary | ICD-10-CM

## 2021-05-03 DIAGNOSIS — D0439 Carcinoma in situ of skin of other parts of face: Secondary | ICD-10-CM | POA: Diagnosis not present

## 2021-05-03 DIAGNOSIS — Z1283 Encounter for screening for malignant neoplasm of skin: Secondary | ICD-10-CM

## 2021-05-03 DIAGNOSIS — L918 Other hypertrophic disorders of the skin: Secondary | ICD-10-CM | POA: Diagnosis not present

## 2021-05-03 DIAGNOSIS — L729 Follicular cyst of the skin and subcutaneous tissue, unspecified: Secondary | ICD-10-CM | POA: Diagnosis not present

## 2021-05-03 NOTE — Patient Instructions (Signed)
Otc Dermend ? ?Biopsy, Surgery (Curettage) & Surgery (Excision) Aftercare Instructions ? ?1. Okay to remove bandage in 24 hours ? ?2. Wash area with soap and water ? ?3. Apply Vaseline to area twice daily until healed (Not Neosporin) ? ?4. Okay to cover with a Band-Aid to decrease the chance of infection or prevent irritation from clothing; also it's okay to uncover lesion at home. ? ?5. Suture instructions: return to our office in 7-10 or 10-14 days for a nurse visit for suture removal. Variable healing with sutures, if pain or itching occurs call our office. It's okay to shower or bathe 24 hours after sutures are given. ? ?6. The following risks may occur after a biopsy, curettage or excision: bleeding, scarring, discoloration, recurrence, infection (redness, yellow drainage, pain or swelling). ? ?7. For questions, concerns and results call our office at [facility] before 4pm & Friday before 3pm. Biopsy results will be available in 1 week. ? ?

## 2021-05-08 ENCOUNTER — Encounter: Payer: Self-pay | Admitting: Nurse Practitioner

## 2021-05-08 ENCOUNTER — Telehealth: Payer: Self-pay | Admitting: *Deleted

## 2021-05-08 ENCOUNTER — Ambulatory Visit: Payer: Medicare HMO | Attending: Family Medicine

## 2021-05-08 ENCOUNTER — Ambulatory Visit: Payer: Medicare HMO | Admitting: Nurse Practitioner

## 2021-05-08 VITALS — BP 122/70 | HR 86 | Temp 98.2°F | Ht 70.0 in | Wt 243.0 lb

## 2021-05-08 DIAGNOSIS — M999 Biomechanical lesion, unspecified: Secondary | ICD-10-CM | POA: Diagnosis not present

## 2021-05-08 DIAGNOSIS — K219 Gastro-esophageal reflux disease without esophagitis: Secondary | ICD-10-CM | POA: Diagnosis not present

## 2021-05-08 DIAGNOSIS — J309 Allergic rhinitis, unspecified: Secondary | ICD-10-CM

## 2021-05-08 DIAGNOSIS — M6281 Muscle weakness (generalized): Secondary | ICD-10-CM | POA: Insufficient documentation

## 2021-05-08 DIAGNOSIS — R2689 Other abnormalities of gait and mobility: Secondary | ICD-10-CM | POA: Insufficient documentation

## 2021-05-08 DIAGNOSIS — R2681 Unsteadiness on feet: Secondary | ICD-10-CM | POA: Diagnosis not present

## 2021-05-08 DIAGNOSIS — J9611 Chronic respiratory failure with hypoxia: Secondary | ICD-10-CM | POA: Diagnosis not present

## 2021-05-08 DIAGNOSIS — J449 Chronic obstructive pulmonary disease, unspecified: Secondary | ICD-10-CM | POA: Diagnosis not present

## 2021-05-08 DIAGNOSIS — G4733 Obstructive sleep apnea (adult) (pediatric): Secondary | ICD-10-CM | POA: Diagnosis not present

## 2021-05-08 MED ORDER — TRELEGY ELLIPTA 100-62.5-25 MCG/ACT IN AEPB: 1.0000 | INHALATION_SPRAY | Freq: Every day | RESPIRATORY_TRACT | 0 refills | Status: DC

## 2021-05-08 NOTE — Therapy (Addendum)
OUTPATIENT PHYSICAL THERAPY THORACOLUMBAR EVALUATION/DC SUMMARY   Patient Name: Allen [name] MRN: [id number] DOB:1950-09-22, 71 y.o., male Today's Date: 05/08/2021 PHYSICAL THERAPY DISCHARGE SUMMARY  Visits from Start of Care: 1  Current functional level related to goals / functional outcomes: UTA   Remaining deficits: UTA   Education / Equipment: HEP   Patient agrees to discharge. Patient goals were not met. Patient is being discharged due to not returning since the last visit.   PT End of Session - 05/08/21 1254     Visit Number 1    Number of Visits 8    Date for PT Re-Evaluation 06/05/21    Authorization Type [employer]    PT Start Time 1130    PT Stop Time 3614    PT Time Calculation (min) 50 min    Activity Tolerance Patient tolerated treatment well    Behavior During Therapy WFL for tasks assessed/performed             Past Medical History:  Diagnosis Date   Anxiety    Arthritis    neck -   BPH with obstruction/lower urinary tract symptoms    urologist-- dr pace   Centrilobular emphysema [facility])    followed by dr Halford Chessman   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[facility])    followed by dr Halford Chessman   Cough 07/2020   covid residual, no congestion   DOE (dyspnea on exertion)    due to copd/ emphysema   ED (erectile dysfunction)    Essential tremor    neurologist--- dr tat----  bilateral upper extremities,  s/p DPS 12/ 2016   GERD (gastroesophageal reflux disease)    History of 2019 novel coronavirus disease (COVID-19) 07/18/2020   per pt postive home test 07-18-2020 w/ mild  symptoms per pt; pt stated had urgent care visit 07-24-2020 for sob/ cough with negative cxr (in care everywhere) ;  follow up pcp visit 08-03-2020 in epic   Insomnia    Mixed simple and mucopurulent chronic bronchitis ([facility])    OSA (obstructive sleep apnea)    followed by dr Halford Chessman---- no longer does uses bipap, intolerant --- pt prescriped  Oxygen  2 L oxygen via [place] while sleeping  (study in epic 04/ 2017 AHI 40)    Perennial allergic rhinitis    Pulmonary nodules    followed by dr Halford Chessman   S/P deep brain stimulator placement 12/2014   followed by neurology-----IPG change 01-21-2020 for end-of life battery  (pt has a control)   Varicose vein of leg    per pt has had 3 procedure's last one 2006   Wears dentures    upper   Past Surgical History:  Procedure Laterality Date   ANKLE SURGERY Right 2020   tendon repair   CARPAL TUNNEL RELEASE Left 2019   CATARACT EXTRACTION W/ INTRAOCULAR LENS IMPLANT Bilateral 2020   COLONOSCOPY     DEEP BRAIN STIMULATOR PLACEMENT Left 2016   INGUINAL HERNIA REPAIR Right 2007   ROTATOR CUFF REPAIR Right 2014   SUBTHALAMIC STIMULATOR BATTERY REPLACEMENT N/A 01/21/2020   Procedure: Deep brain stimulator battery replacement;  Surgeon: Erline Levine, MD;  Location: [facility];  Service: Neurosurgery;  Laterality: N/A;   TONSILLECTOMY AND ADENOIDECTOMY     age 19   TOTAL SHOULDER REPLACEMENT Left 2016   TRANSURETHRAL RESECTION OF PROSTATE N/A 08/09/2020   Procedure: TRANSURETHRAL RESECTION OF THE PROSTATE (TURP), BLADDER BIOPSY AND FULGERATION;  Surgeon: Robley Fries, MD;  Location: [facility];  Service: Urology;  Laterality: N/A;   VEIN SURGERY Left 2006   varicose veins   Patient Active Problem List   Diagnosis Date Noted   Chronic respiratory failure with hypoxia ([place]) 09/01/2020   BPH with obstruction/lower urinary tract symptoms 08/09/2020   S/P deep brain stimulator placement 03/25/2020   Tremor 03/25/2020   Pulmonary nodule less than 6 mm in diameter with high risk for malignant neoplasm 03/09/2020   Injury of axilla 03/03/2020   Body mass index (BMI) 32.0-32.9, adult 02/22/2020   Cervical radiculopathy 02/22/2020   Elevated blood-pressure reading, without diagnosis of hypertension 02/22/2020   Neck pain 02/22/2020   Greater trochanteric bursitis of right hip 11/18/2019   Degenerative disc disease, cervical 07/01/2019   Degenerative disc  disease, lumbar 07/01/2019   Nonallopathic lesion of lumbar region 07/01/2019   Varicose veins of both lower extremities 06/23/2019   Chronic diastolic CHF (congestive heart failure) ([place]) 06/02/2019   Perennial allergic rhinitis probable nonallergic component 06/01/2019   Cough, persistent 06/01/2019   Vitamin D deficiency 05/07/2019   Moderate persistent asthma without complication 16/38/4665   Allergic rhinitis 04/10/2019   Shortness of breath 04/10/2019   GERD (gastroesophageal reflux disease)    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([place])    OSA treated with BiPAP    Right carpal tunnel syndrome    Essential tremor    Insomnia    ED (erectile dysfunction)    Ulcerative colitis ([place]) 06/30/2014    PCP: Isaac Bliss, Rayford Halsted, MD  REFERRING PROVIDER: Lyndal Pulley, DO  REFERRING DIAG: M99.9 (ICD-10-CM) - Nonallopathic lesion of lumbar region R26.89 (ICD-10-CM) - Balance problems   THERAPY DIAG:  Unsteadiness on feet  Muscle weakness (generalized)  ONSET DATE: chronic  SUBJECTIVE:                                                                                                                                                                                           SUBJECTIVE STATEMENT: Describes a history of balance loss due to low back pain and postural changes.  Has fallen 3 times that he knows of.  Balance loss is always forward   PERTINENT HISTORY:  Allen [name] is a 70 y.o. male coming in with complaint of lumbar spine pain. Epidural on 03/21/2021. Patient states that his left side of his neck is still tight, and is wanting to follow up on the CT scan. Patient states he thinks he needs another Epidural for the neck.  Patient states that it is affecting daily activities.  Feels like it is getting worse over the course of time.  In addition to this affecting sleep from time to time.  Also patient feels like he is having difficulty with balance.  PAIN:  Are you having pain? Yes: NPRS  scale: 2/10 Pain location: low back/neck Pain description: ache Aggravating factors: activity Relieving factors: rest   PRECAUTIONS: Fall  WEIGHT BEARING RESTRICTIONS No  FALLS:  Has patient fallen in last 6 months? Yes. Number of falls 3  LIVING ENVIRONMENT: Lives with: lives with their family Lives in: House/apartment Stairs: Yes: Internal: 16 steps; on right going up  OCCUPATION: retired  PLOF: Independent  PATIENT GOALS To improve my balance and decrease my fall risk   OBJECTIVE:   DIAGNOSTIC FINDINGS:  Lumbar myelogram 03/24/2021 IMPRESSION: 1. Advanced lumbar disc and facet degeneration without significant spinal stenosis. 2. Moderate lateral recess and right neural foraminal stenosis at L3-4. 3. Mild lateral recess and mild to moderate neural foraminal stenosis at L4-5 and L5-S1. 4. Aortic Atherosclerosis (ICD10-I70.0).   Lumbar CT 03/24/2021 IMPRESSION: 1. Advanced lumbar disc and facet degeneration without significant spinal stenosis. 2. Moderate lateral recess and right neural foraminal stenosis at L3-4. 3. Mild lateral recess and mild to moderate neural foraminal stenosis at L4-5 and L5-S1. 4. Aortic Atherosclerosis (ICD10-I70.0).  PATIENT SURVEYS:  FOTO 44 (24 predicted)  SCREENING FOR RED FLAGS: Bowel or bladder incontinence: No Spinal tumors: No Cauda equina syndrome: No Compression fracture: No Abdominal aneurysm: No  COGNITION:  Overall cognitive status: Within functional limits for tasks assessed     SENSATION: WFL  MUSCLE LENGTH: Tight heelcords B  POSTURE:  Rounded shoulders, flat lordosis   PALPATION: Unremarkable   LUMBAR ROM: Deferred in favor of balance assessment  Active  A/PROM  05/08/2021  Flexion   Extension   Right lateral flexion   Left lateral flexion   Right rotation   Left rotation    (Blank rows = not tested)  LE ROM:  Active  Right 05/08/2021 Left 05/08/2021  Hip flexion    Hip extension    Hip  abduction    Hip adduction    Hip internal rotation    Hip external rotation    Knee flexion    Knee extension    Ankle dorsiflexion 6d 8d  Ankle plantarflexion    Ankle inversion    Ankle eversion     (Blank rows = not tested)  LE MMT:  MMT Right 05/08/2021 Left 05/08/2021  Hip flexion    Hip extension    Hip abduction    Hip adduction    Hip internal rotation    Hip external rotation    Knee flexion    Knee extension    Ankle dorsiflexion 3+ 4  Ankle plantarflexion 3+ 3+  Ankle inversion    Ankle eversion     (Blank rows = not tested)  LUMBAR SPECIAL TESTS:  Deferred in favor of balance testing  FUNCTIONAL TESTS:  5 times sit to stand: 19s  mCTSIB 30s at#1,2,3, 20s at #4   05/08/21 0001  Dynamic Gait Index  Level Surface 3  Change in Gait Speed 2  Gait with Horizontal Head Turns 2  Gait with Vertical Head Turns 2  Gait and Pivot Turn 3  Step Over Obstacle 2  Step Around Obstacles 3  Steps 2  Total Score 19     GAIT: Distance walked: 45f x2 Assistive device utilized: None Level of assistance: Complete Independence     TODAY'S TREATMENT  Eval    PATIENT EDUCATION:  Education details: Discussed eval findings, rehab rationale and POC and patient is in agreement  Person  educated: Patient Education method: Explanation, Demonstration, and handouts Education comprehension: needs further education   HOME EXERCISE PROGRAM: Access Code: [email or web address] URL: [email or web address] Date: 05/08/2021 Prepared by: Sharlynn Oliphant  Exercises - Toe Raise With Back Against Wall  - 2 x daily - 7 x weekly - 2 sets - 15 reps - Standing Heel Raise with Support  - 2 x daily - 7 x weekly - 2 sets - 15 reps - Standing Ankle Dorsiflexion Stretch  - 2 x daily - 7 x weekly - 1 sets - 3 reps - 30s hold  ASSESSMENT:  CLINICAL IMPRESSION: Patient is a 71 y.o. male who was seen today for physical therapy evaluation and treatment for balance disorder. Patient  presents with chronic low back pain which has led him to adopt a flexed posture to accommodate pain but causes a change in his COG.  Mobility loss and weakness noted in B ankles.   OBJECTIVE IMPAIRMENTS decreased balance, decreased endurance, decreased knowledge of condition, decreased ROM, decreased strength, postural dysfunction, and pain.    PERSONAL FACTORS Age, Past/current experiences, and Time since onset of injury/illness/exacerbation are also affecting patient's functional outcome.    REHAB POTENTIAL: Good  CLINICAL DECISION MAKING: Stable/uncomplicated  EVALUATION COMPLEXITY: Moderate   GOALS: Goals reviewed with patient? Yes  SHORT TERM GOALS: Target date: 05/22/2021  Patient to demonstrate independence in HEP  Baseline: Goal status: INITIAL  2.  Patient to hold position #4 on mCTSIB for 30s   Baseline: 20 Goal status: INITIAL   LONG TERM GOALS: Target date: 06/05/2021  Increase B ankle strength to 4/5 Baseline:  MMT Right 05/08/2021 Left 05/08/2021   Ankle dorsiflexion 3+ 4  Ankle plantarflexion 3+ 3+   Goal status: INITIAL  2.  Decrease 5x STS time to 15s or less Baseline: 19s  Goal status: INITIAL  3.  Increase FOTO score to 53 Baseline: 44 Goal status: INITIAL  4.  Increase DGI score to 22 Baseline: 19 Goal status: INITIAL  PLAN: PT FREQUENCY: 2x/week  PT DURATION: 4 weeks  PLANNED INTERVENTIONS: Therapeutic exercises, Therapeutic activity, Neuromuscular re-education, Balance training, Gait training, Patient/Family education, Joint mobilization, and Stair training.  PLAN FOR NEXT SESSION: Upgrade HEP, ankle strength and stretch, balance and proprioception, stair, postural education and training    Lanice Shirts, PT 05/08/2021, 3:59 PM   Referring diagnosis? Balance disorder Treatment diagnosis? (if different than referring diagnosis) Balance disorder What was this (referring dx) caused by? _0  Surgery _1  Fall _2  Ongoing issue _3   Arthritis _4  Other: ____________  Laterality: _5  Rt _6  Lt _7  Both  Check all possible CPT codes:  *CHOOSE 10 OR LESS*    _8  97110 (Therapeutic Exercise)  _9  92507 (SLP Treatment)  _10  02585 (Neuro Re-ed)   _11  27782 (Swallowing Treatment)   _12  42353 (Gait Training)   _13  61443 (Cognitive Training, 1st 15 minutes) _14  97140 (Manual Therapy)   _15  97130 (Cognitive Training, each add'l 15 minutes)  _16  97164 (Re-evaluation)                              _17  Other, List CPT Code ____________  _18  15400 (Therapeutic Activities)     _19  86761 (Self Care)   _20  All codes above (97110 - 97535)  _21  97012 (Mechanical Traction)  _22  97014 (E-stim Unattended)  _23  97032 (E-stim manual)  _24  97033 (Ionto)  _25  97035 (Ultrasound)  _26  95093 (Orthotic Fit) _27  [id number] (Physical Performance Training) _28  [id number] (  Aquatic Therapy) _0  97034 (Contrast Bath) _1  [id number] (Paraffin) _2  97597 (Wound Care 1st 20 sq cm) _3  97598 (Wound Care each add'l 20 sq cm) _4  97016 (Vasopneumatic Device) _5  25638 (Orthotic Training) _6  [phone] (Prosthetic Training)

## 2021-05-08 NOTE — Telephone Encounter (Signed)
-----   Message from Lavonna Monarch, MD sent at 05/05/2021  5:47 PM EDT ----- ?The note about a treated nonmelanoma skin cancer on the shoulder was meant for a different individual rather than this patient..  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 ?

## 2021-05-08 NOTE — Assessment & Plan Note (Signed)
Well-controlled on PPI regimen. ?

## 2021-05-08 NOTE — Addendum Note (Signed)
Addended by: Lanice Shirts on: 05/08/2021 04:16 PM ? ? Modules accepted: Orders ? ?

## 2021-05-08 NOTE — Assessment & Plan Note (Addendum)
Mild OSA with AHI 8.1. 149 min spent <88%. Desires to restart BiPAP therapy at previous settings - BiPAP 7/14, pressure support 4. Mask desensitization ordered. Will obtain ONO once on BiPAP to assess need for supplemental O2. We discussed how untreated sleep apnea puts an individual at risk for cardiac arrhthymias, pulm HTN, DM, stroke and increases their risk for daytime accidents. We also briefly reviewed treatment options including weight loss, side sleeping position, oral appliance, CPAP therapy or referral to ENT for possible surgical options ? ?Patient Instructions  ?Continue Trelegy 1 puff daily. Brush tongue and rinse mouth afterwards ?Continue Albuterol inhaler 2 puffs or 3 mL neb every 6 hours as needed for shortness of breath or wheezing. Notify if symptoms persist despite rescue inhaler/neb use. ?Continue astelin nasal spray 1-2 sprays each nostril Twice daily as needed for nasal congestion/runny nose ?Continue flonase nasal spray 2 sprays each nostril as needed for nasal congestion/runny nose ?Continue protonix 40 mg daily  ?Continue supplemental oxygen 2 lpm at night.  ? ?Restart BiPAP every night, minimum of 4-6 hours a night.  ?Change equipment every 30 days or as directed by DME. Wash your tubing with warm soap and water daily, hang to dry. Wash humidifier portion weekly.  ?Be aware of reduced alertness and do not drive or operate heavy machinery if experiencing this or drowsiness.  ?Healthy weight management discussed.  ?Avoid or decrease alcohol consumption and medications that make you more sleepy, if possible. ?Notify if persistent daytime sleepiness occurs even with consistent use of CPAP.  ? ?Follow up with Dr. Halford Chessman or Katie Tashena Ibach,NP in 8-12 weeks. If symptoms do not improve or worsen, please contact office for sooner follow up or seek emergency care. ? ? ?

## 2021-05-08 NOTE — Telephone Encounter (Signed)
Path to patient. Made surgical appointment with Dr. Denna Haggard. ?

## 2021-05-08 NOTE — Progress Notes (Signed)
Reviewed and agree with assessment/plan. ? ? ?Chesley Mires, MD ?[place] ?05/08/2021, 1:36 PM ?Pager:  [phone] ? ?

## 2021-05-08 NOTE — Assessment & Plan Note (Signed)
Stable at visit on room air. Primary use at night. Advised him to continue to use supplemental O2 2 lpm at night until he starts back on his BiPAP. Goal SpO2 >88-90% ?

## 2021-05-08 NOTE — Assessment & Plan Note (Addendum)
Well-controlled on current regimen. ?

## 2021-05-08 NOTE — Progress Notes (Signed)
? ?'@Patient'$  ID: Allen [name], male    DOB: 05/08/1950, 71 y.o.   MRN: [id number] ? ?Chief Complaint  ?Patient presents with  ? Follow-up  ?  He is her to go over his HST results from 04/26/21.  ? ? ?Referring provider: ?Isaac Bliss, [place]* ? ?HPI: ?71 year old male, former smoker (60 pack years) followed for COPD/asthma, OSA, allergic rhinitis, obesity hypoventilation syndrome. He is a patient of Dr. Juanetta Gosling and last seen in office 02/15/2021. Past medical history significant for diastolic CHF, GERD, UC, carpal tunnel, DDD, BPH, vit D deficiency, insomnia.  ? ?TEST/EVENTS:  ?04/20/2015 HST: AHI 40  ?12/21/2016 PFTs: FEV1 2.4 (69), FEV1% 74, TLC 4.95 (73%), DLCO 77%, +BD ?04/10/2019 IgE: 66  ?04/20/2019 PFTs: FEV1 2.09 (62%), FEV1% 74, TLC 5.33 (75%), DLCO 93%  ?05/04/2019 CT chest: centrilobular and paraseptal emphysema, 3 mm nodule RUL, 4 mm nodule RML, 3 mm nodule lingula ?05/11/2020 CT chest: stable nodules up to 3 mm ? ?05/08/2021: Today - follow up ?Patient presents today for follow up after home sleep study which showed a mild obstructive sleep apnea and significant time spent below 88%. He has a BiPAP machine at home that is 71 years old and paid for so he would like to go back on this. He does continue to wake frequently throughout the night, despite the use of trazodone. Also notes daytime fatigue symptoms. He denies morning headaches or drowsy driving. He would like to restart his BiPAP therapy but wants to see what other mask options he may have. This was his biggest issue last time as the full face mask mad him breakout.  ? ?Overall, he reports feeling well without any recent exacerbations in his COPD/asthma. Breathing has been well-controlled. He does have some DOE with strenuous activity, but states it is unchanged from baseline. Denies any cough or wheezing. He really only uses albuterol with exercise. Continues on Trelegy daily. Uses flonase and astelin as needed for allergies. He is anticipated to see a  cardiologist May 1st for evaluation of his lower extremity swelling.  ? ?No Known Allergies ? ?Immunization History  ?Administered Date(s) Administered  ? Fluad Quad(high Dose 65+) 09/18/2018, 11/10/2019  ? Influenza, High Dose Seasonal PF 11/01/2020  ? PFIZER(Purple Top)SARS-COV-2 Vaccination 02/20/2019, 03/17/2019, 06/02/2019, 10/03/2019, 11/01/2020  ? Pneumococcal Polysaccharide-23 01/08/2015  ? Pneumococcal-Unspecified 12/15/2017  ? Tdap 09/17/2016  ? Zoster Recombinat (Shingrix) 06/06/2019, 09/05/2019  ? ? ?Past Medical History:  ?Diagnosis Date  ? Anxiety   ? Arthritis   ? neck -  ? BPH with obstruction/lower urinary tract symptoms   ? urologist-- dr pace  ? Centrilobular emphysema ([place])   ? followed by dr Halford Chessman  ? 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([place])   ? followed by dr Halford Chessman  ? Cough 07/2020  ? covid residual, no congestion  ? DOE (dyspnea on exertion)   ? due to copd/ emphysema  ? ED (erectile dysfunction)   ? Essential tremor   ? neurologist--- dr tat----  bilateral upper extremities,  s/p DPS 12/ 2016  ? GERD (gastroesophageal reflux disease)   ? History of 2019 novel coronavirus disease (COVID-19) 07/18/2020  ? per pt postive home test 07-18-2020 w/ mild  symptoms per pt; pt stated had urgent care visit 07-24-2020 for sob/ cough with negative cxr (in care everywhere) ;  follow up pcp visit 08-03-2020 in epic  ? Insomnia   ? Mixed simple and mucopurulent chronic bronchitis ([place])   ? OSA (obstructive sleep apnea)   ? followed by  dr Halford Chessman---- no longer does uses bipap, intolerant --- pt prescriped  Oxygen  2 L oxygen via [place] while sleeping  (study in epic 04/ 2017 AHI 40)  ? Perennial allergic rhinitis   ? Pulmonary nodules   ? followed by dr Halford Chessman  ? S/P deep brain stimulator placement 12/2014  ? followed by neurology-----IPG change 01-21-2020 for end-of life battery  (pt has a control)  ? Varicose vein of leg   ? per pt has had 3 procedure's last one 2006  ? Wears dentures   ? upper  ? ? ?Tobacco History: ?Social History   ? ?Tobacco Use  ?Smoking Status Former  ? Packs/day: 3.00  ? Years: 20.00  ? Pack years: 60.00  ? Types: Cigarettes  ? Quit date: 60  ? Years since quitting: 32.3  ?Smokeless Tobacco Never  ? ?Counseling given: Not Answered ? ? ?Outpatient Medications Prior to Visit  ?Medication Sig Dispense Refill  ? albuterol (PROVENTIL) (2.5 MG/3ML) 0.083% nebulizer solution Take 3 mLs (2.5 mg total) by nebulization every 6 (six) hours. 150 mL 3  ? albuterol (VENTOLIN HFA) 108 (90 Base) MCG/ACT inhaler INHALE 2 PUFFS INTO THE LUNGS EVERY 4 HOURS AS NEEDED FOR WHEEZE OR FOR SHORTNESS OF BREATH 8.5 each 0  ? azelastine (ASTELIN) 0.1 % nasal spray Place 1-2 sprays into both nostrils 2 (two) times daily as needed for rhinitis. 30 mL 5  ? escitalopram (LEXAPRO) 20 MG tablet Take 1 tablet (20 mg total) by mouth daily. 90 tablet 1  ? fluticasone (FLONASE) 50 MCG/ACT nasal spray Place 2 sprays into both nostrils daily as needed for allergies or rhinitis. 16 g 5  ? Fluticasone-Umeclidin-Vilant (TRELEGY ELLIPTA) 200-62.5-25 MCG/ACT AEPB Inhale 1 puff into the lungs daily. 60 each 0  ? folic acid (FOLVITE) 762 MCG tablet Take 400 mcg by mouth daily.    ? hydrOXYzine (VISTARIL) 25 MG capsule Take 25 mg by mouth as needed.    ? mirabegron ER (MYRBETRIQ) 25 MG TB24 tablet Take 25 mg by mouth daily.    ? pantoprazole (PROTONIX) 40 MG tablet Take 1 tablet (40 mg total) by mouth daily. 90 tablet 1  ? predniSONE (DELTASONE) 20 MG tablet Take 2 tablets (40 mg total) by mouth daily with breakfast. 10 tablet 0  ? primidone (MYSOLINE) 50 MG tablet 2 in the AM, 1 in the PM 270 tablet 1  ? silodosin (RAPAFLO) 8 MG CAPS capsule Take 1 capsule (8 mg total) by mouth daily. 30 capsule 1  ? tadalafil (CIALIS) 20 MG tablet Take 1 tablet (20 mg total) by mouth daily as needed for erectile dysfunction. (Patient taking differently: Take 10 mg by mouth daily.) 90 tablet 3  ? traZODone (DESYREL) 100 MG tablet Take 1 tablet (100 mg total) by mouth at bedtime. 90  tablet 1  ? zolpidem (AMBIEN) 10 MG tablet Take 1 tablet (10 mg total) by mouth at bedtime as needed for sleep. 90 tablet 1  ? ?No facility-administered medications prior to visit.  ? ? ? ?Review of Systems:  ? ?Constitutional: No weight loss or gain, night sweats, fevers, chills. +fatigue ?HEENT: No headaches, difficulty swallowing, tooth/dental problems, or sore throat. No sneezing, itching, ear ache, nasal congestion, or post nasal drip. +snoring ?CV:  +swelling in lower extremities. No chest pain, orthopnea, PND, anasarca, dizziness, palpitations, syncope ?Resp: +shortness of breath with strenuous activity (baseline). No excess mucus or change in color of mucus. No productive or non-productive. No hemoptysis. No wheezing.  No  chest wall deformity ?GI:  No heartburn, indigestion, abdominal pain, nausea, vomiting, diarrhea, change in bowel habits, loss of appetite, bloody stools.  ?Skin: No rash, lesions, ulcerations ?MSK:  No joint pain or swelling.  No decreased range of motion.  No back pain. ?Neuro: No dizziness or lightheadedness.  ?Psych: No depression or anxiety. Mood stable.  ? ? ? ?Physical Exam: ? ?BP 122/70 (BP Location: Left Arm, Cuff Size: Large)   Pulse 86   Temp 98.2 ?F (36.8 ?C) (Oral)   Ht '5\' 10"'$  (1.778 m)   Wt 243 lb (110.2 kg)   SpO2 96%   BMI 34.87 kg/m?  ? ?GEN: Pleasant, interactive, well-appearing; obese; in no acute distress. ?HEENT:  Normocephalic and atraumatic. PERRLA. Sclera white. Nasal turbinates pink, moist and patent bilaterally. No rhinorrhea present. Oropharynx pink and moist, without exudate or edema. No lesions, ulcerations, or postnasal drip.  ?NECK:  Supple w/ fair ROM. No JVD present. Normal carotid impulses w/o bruits. Thyroid symmetrical with no goiter or nodules palpated. No lymphadenopathy.   ?CV: RRR, no m/r/g, no peripheral edema. Pulses intact, +2 bilaterally. No cyanosis, pallor or clubbing. ?PULMONARY:  Unlabored, regular breathing. Clear bilaterally A&P w/o  wheezes/rales/rhonchi. No accessory muscle use. No dullness to percussion. ?GI: BS present and normoactive. Soft, non-tender to palpation. No organomegaly or masses detected. No CVA tenderness. ?MSK: No erythema,

## 2021-05-08 NOTE — Patient Instructions (Addendum)
Continue Trelegy 1 puff daily. Brush tongue and rinse mouth afterwards ?Continue Albuterol inhaler 2 puffs or 3 mL neb every 6 hours as needed for shortness of breath or wheezing. Notify if symptoms persist despite rescue inhaler/neb use. ?Continue astelin nasal spray 1-2 sprays each nostril Twice daily as needed for nasal congestion/runny nose ?Continue flonase nasal spray 2 sprays each nostril as needed for nasal congestion/runny nose ?Continue protonix 40 mg daily  ?Continue supplemental oxygen 2 lpm at night.  ? ?Restart BiPAP every night, minimum of 4-6 hours a night.  ?Change equipment every 30 days or as directed by DME. Wash your tubing with warm soap and water daily, hang to dry. Wash humidifier portion weekly.  ?Be aware of reduced alertness and do not drive or operate heavy machinery if experiencing this or drowsiness.  ?Healthy weight management discussed.  ?Avoid or decrease alcohol consumption and medications that make you more sleepy, if possible. ?Notify if persistent daytime sleepiness occurs even with consistent use of CPAP.  ? ?Follow up with Dr. Halford Chessman or Katie Kit Brubacher,NP in 8-12 weeks. If symptoms do not improve or worsen, please contact office for sooner follow up or seek emergency care. ?

## 2021-05-08 NOTE — Assessment & Plan Note (Signed)
Compensated on current regimen. No recent flares requiring prednisone. Maintain on triple therapy with Trelegy. ?

## 2021-05-12 DIAGNOSIS — M25571 Pain in right ankle and joints of right foot: Secondary | ICD-10-CM | POA: Diagnosis not present

## 2021-05-15 DIAGNOSIS — M4712 Other spondylosis with myelopathy, cervical region: Secondary | ICD-10-CM | POA: Diagnosis not present

## 2021-05-15 DIAGNOSIS — Z6834 Body mass index (BMI) 34.0-34.9, adult: Secondary | ICD-10-CM | POA: Diagnosis not present

## 2021-05-15 DIAGNOSIS — M542 Cervicalgia: Secondary | ICD-10-CM | POA: Diagnosis not present

## 2021-05-16 ENCOUNTER — Other Ambulatory Visit (HOSPITAL_COMMUNITY): Payer: Self-pay | Admitting: Neurological Surgery

## 2021-05-16 ENCOUNTER — Other Ambulatory Visit: Payer: Self-pay | Admitting: Neurological Surgery

## 2021-05-16 DIAGNOSIS — M4712 Other spondylosis with myelopathy, cervical region: Secondary | ICD-10-CM

## 2021-05-17 ENCOUNTER — Telehealth: Payer: Self-pay | Admitting: Pharmacist

## 2021-05-17 NOTE — Chronic Care Management (AMB) (Signed)
Chronic Care Management Pharmacy Assistant   Name: Allen [name]  MRN: [id number] DOB: [birth year]  Reason for Encounter: Disease State / COPD Assessment Call   Conditions to be addressed/monitored: COPD  Recent office visits:  None  Recent consult visits:  05/15/2021 Pieter Partridge Dawley DO (neurosurgery) - Patient was seen for Cervical spondylosis with myelopathy and additional issues. No additional chart notes.    05/08/2021 Sharlynn Oliphant PT (OP Rehab) - Patient was seen for unsteadiness on feet and muscle weakness. No medication changes. Follow up twice weekly for 4 weeks.   05/08/2021 [name] Kitchen NP (pulmonary) - Patient was seen for OSA (obstructive sleep apnea) and additional issues. Changed Trelegy Ellipta to 100-62.5-25 mcg/act 1 puff daily. Follow up with Dr. Halford Chessman or Katie Cobb,NP in 8-12 weeks. If symptoms do not improve or worsen, please contact office for sooner follow up or seek emergency care.  05/03/2021 Lavonna Monarch MD (dermatology) - Patient was seen for Encounter for screening for malignant neoplasm of skin and additional issues. No medication changes. Follow up in 1 year.  04/25/2021 Hulan Saas DO (sports med) - Patient was seen for Nonallopathic lesion of lumbar region and additional issues. Referral to physical therapy and neurosurgery. No medication changes. Follow up in 6-8 weeks after injection.   03/27/2021 Richard Groat (opthamology) - Patient was seen for Nonexudative age-related macular degeneration, bilateral, early dry stage and an additional issue. No additional chart notes.   Hospital visits:  None  Medications: Outpatient Encounter Medications as of 05/17/2021  Medication Sig Note   albuterol (PROVENTIL) (2.5 MG/3ML) 0.083% nebulizer solution Take 3 mLs (2.5 mg total) by nebulization every 6 (six) hours. 08/03/2020: .   albuterol (VENTOLIN HFA) 108 (90 Base) MCG/ACT inhaler INHALE 2 PUFFS INTO THE LUNGS EVERY 4 HOURS AS NEEDED FOR WHEEZE OR FOR  SHORTNESS OF BREATH    azelastine (ASTELIN) 0.1 % nasal spray Place 1-2 sprays into both nostrils 2 (two) times daily as needed for rhinitis.    escitalopram (LEXAPRO) 20 MG tablet Take 1 tablet (20 mg total) by mouth daily.    fluticasone (FLONASE) 50 MCG/ACT nasal spray Place 2 sprays into both nostrils daily as needed for allergies or rhinitis.    Fluticasone-Umeclidin-Vilant (TRELEGY ELLIPTA) 100-62.5-25 MCG/ACT AEPB Inhale 1 puff into the lungs daily.    folic acid (FOLVITE) 854 MCG tablet Take 400 mcg by mouth daily.    hydrOXYzine (VISTARIL) 25 MG capsule Take 25 mg by mouth as needed.    mirabegron ER (MYRBETRIQ) 25 MG TB24 tablet Take 25 mg by mouth daily.    pantoprazole (PROTONIX) 40 MG tablet Take 1 tablet (40 mg total) by mouth daily.    predniSONE (DELTASONE) 20 MG tablet Take 2 tablets (40 mg total) by mouth daily with breakfast.    primidone (MYSOLINE) 50 MG tablet 2 in the AM, 1 in the PM    silodosin (RAPAFLO) 8 MG CAPS capsule Take 1 capsule (8 mg total) by mouth daily.    tadalafil (CIALIS) 20 MG tablet Take 1 tablet (20 mg total) by mouth daily as needed for erectile dysfunction. (Patient taking differently: Take 10 mg by mouth daily.)    traZODone (DESYREL) 100 MG tablet Take 1 tablet (100 mg total) by mouth at bedtime.    zolpidem (AMBIEN) 10 MG tablet Take 1 tablet (10 mg total) by mouth at bedtime as needed for sleep.    No facility-administered encounter medications on file as of 05/17/2021.  Fill History: AZELASTINE 0.1% (137 MCG)  SPRY 11/24/2019 90   ALBUTEROL HFA 90 MCG INHALER 11/11/2020 20   escitalopram 20 mg tablet 03/27/2021 90   FLUTICASONE PROP 0.005% OINT 07/26/2020 30   Trelegy Ellipta 200 mcg-62.5 mcg-25 mcg powder for inhalation 03/27/2021 90   Myrbetriq 50 mg tablet,extended release 03/30/2021 90   montelukast 10 mg tablet 03/27/2021 90   pantoprazole 40 mg tablet,delayed release 03/29/2021 90   primidone 50 mg tablet 03/01/2021 90    SILODOSIN '8MG'$        CAP 01/27/2021 90   TADALAFIL '20MG'$       TAB 01/21/2021 90   TAMSULOSIN 0.'4MG'$  CAPSULES 03/29/2021 90   zolpidem 10 mg tablet 03/31/2021 90   hydroxyzine pamoate 50 mg capsule 03/27/2021 90   trazodone 100 mg tablet 03/29/2021 90        View : No data to display.         Current COPD regimen: Proventil 2.'5mg'$ /'3mg'$  nebulizer solution Take 3 mLs  by nebulization every 6 hours. Ventolin HFA INHALE 2 PUFFS INTO THE LUNGS EVERY 4 HOURS AS NEEDED FOR WHEEZE OR FOR SHORTNESS OF BREATH. Trelegy Ellipta 100-62.5-25 Inhale 1 puff into the lungs daily  Any recent hospitalizations or ED visits since last visit with CPP? Patient denies any hospital visits.   Patient denies COPD symptoms currently  What recent interventions/DTPs have been made by any provider to improve breathing since last visit: No recent interventions  Have you had exacerbation/flare-up since last visit? Patient denies.   What do you do when you are short of breath?  Patient will rest and use his rescue inhaler.  He only needs this if he climbs a steep hill of set of stairs. He doesn't do this often.   Respiratory Devices/Equipment Do you have a nebulizer? Patient does have a nebulizer if needed.   Do you use a Peak Flow Meter? Patient denies  Do you use a maintenance inhaler? Patient doe use a maintenance inhaler.  How often do you forget to use your daily inhaler? Patient doesn't forget to use this.   Do you use a rescue inhaler? Patient states he does use his rescue inhaler as needed.   How often do you use your rescue inhaler?  Patient states he uses his rescue inhaler when he walks up steep hills or climbs a set of stairs. He doesn't do this often.   Do you use a spacer with your inhaler? Patient doesn't use a spacer anymore.   Adherence Review: Does the patient have >5 day gap between last estimated fill date for maintenance inhaler medications? No   Care Gaps: AWV - previous  message sent to [name] Last BP - 122/70 on 05/08/2021 Covid booster - overdue  Star Rating Drugs: None  [place] Pharmacist Assistant [phone]

## 2021-05-18 ENCOUNTER — Other Ambulatory Visit: Payer: Medicare HMO

## 2021-05-21 ENCOUNTER — Encounter: Payer: Self-pay | Admitting: Dermatology

## 2021-05-21 NOTE — Progress Notes (Signed)
? ?  New Patient ?  ?Subjective  ?Allen [name] is a 71 y.o. male who presents for the following: Annual Exam (Lesion on left side of nose x years- no itch or bleed). ? ?Check spots on nose and neck, no other areas ?Location:  ?Duration:  ?Quality:  ?Associated Signs/Symptoms: ?Modifying Factors:  ?Severity:  ?Timing: ?Context:  ? ? ?The following portions of the chart were reviewed this encounter and updated as appropriate:  Tobacco  Allergies  Meds  Problems  Med Hx  Surg Hx  Fam Hx   ?  ? ?Objective  ?Well appearing patient in no apparent distress; mood and affect are within normal limits. ?General skin examination, no atypical pigmented spots (all checked with dermoscopy).  Possible nonmelanoma skin cancers on nose and lower neck areas will be biopsied ? ?Head - Anterior (Face) ?Several 2 mm flesh-colored papules with eccentric dell, compatible dermoscopy ? ?Left Axilla, Left Upper Back, Right Upper Arm - Anterior ?Multiple 1 to 4 mm pedunculated flesh-colored papules ? ?Mid Back ?Open cyst ? ?Right Anterior Neck ?Pearly 5 mm pink papule ? ? ? ? ?Left Nasal Sidewall ?Waxy 3 mm crust ? ? ? ? ? ? ? ? ?All skin waist up examined. ? ? ?Assessment & Plan  ?Encounter for screening for malignant neoplasm of skin ? ?Annual skin examination ? ?Sebaceous gland hyperplasia ?Head - Anterior (Face) ? ?Told of similar appearance of early [facility] so if any lesion grows or bleeds return for biopsy ? ?Skin tag (3) ?Right Upper Arm - Anterior; Left Axilla; Left Upper Back ? ?Patient may choose to have removed in future ? ?Cyst of skin ?Mid Back ? ?Neoplasm of uncertain behavior of skin (2) ?Right Anterior Neck ? ?Skin / nail biopsy ?Type of biopsy: tangential   ?Informed consent: discussed and consent obtained   ?Timeout: patient name, date of birth, surgical site, and procedure verified   ?Anesthesia: the lesion was anesthetized in a standard fashion   ?Anesthetic:  1% lidocaine w/ epinephrine 1-100,000 local  infiltration ?Instrument used: flexible razor blade   ?Hemostasis achieved with: ferric subsulfate and electrodesiccation   ?Outcome: patient tolerated procedure well   ?Post-procedure details: wound care instructions given   ? ?Specimen 1 - Surgical pathology ?Differential Diagnosis: bcc vs scc ? ?Check Margins: No ? ?Left Nasal Sidewall ? ?Skin / nail biopsy ?Type of biopsy: tangential   ?Informed consent: discussed and consent obtained   ?Timeout: patient name, date of birth, surgical site, and procedure verified   ?Anesthesia: the lesion was anesthetized in a standard fashion   ?Anesthetic:  1% lidocaine w/ epinephrine 1-100,000 local infiltration ?Instrument used: flexible razor blade   ?Hemostasis achieved with: ferric subsulfate and electrodesiccation   ?Outcome: patient tolerated procedure well   ?Post-procedure details: wound care instructions given   ? ?Specimen 2 - Surgical pathology ?Differential Diagnosis: bcc vs scc ? ?Check Margins: No ? ? ?

## 2021-05-23 ENCOUNTER — Ambulatory Visit: Payer: Medicare HMO

## 2021-05-25 ENCOUNTER — Ambulatory Visit
Admission: RE | Admit: 2021-05-25 | Discharge: 2021-05-25 | Disposition: A | Discharge status: Home / Self Care | Payer: Medicare HMO | Source: Ambulatory Visit | Attending: Family Medicine | Admitting: Family Medicine

## 2021-05-25 DIAGNOSIS — M47812 Spondylosis without myelopathy or radiculopathy, cervical region: Secondary | ICD-10-CM | POA: Diagnosis not present

## 2021-05-25 DIAGNOSIS — M5412 Radiculopathy, cervical region: Secondary | ICD-10-CM

## 2021-05-25 DIAGNOSIS — M25561 Pain in right knee: Secondary | ICD-10-CM | POA: Diagnosis not present

## 2021-05-25 MED ORDER — TRIAMCINOLONE ACETONIDE 40 MG/ML IJ SUSP (RADIOLOGY)
60.0000 mg | Freq: Once | INTRAMUSCULAR | Status: AC
  Administered 2021-05-25: 60 mg via EPIDURAL

## 2021-05-25 MED ORDER — IOPAMIDOL (ISOVUE-M 300) INJECTION 61%
1.0000 mL | Freq: Once | INTRAMUSCULAR | Status: AC
  Administered 2021-05-25: 1 mL via EPIDURAL

## 2021-05-25 NOTE — Discharge Instructions (Signed)

## 2021-05-29 ENCOUNTER — Ambulatory Visit (HOSPITAL_BASED_OUTPATIENT_CLINIC_OR_DEPARTMENT_OTHER): Payer: Medicare HMO | Admitting: Cardiology

## 2021-05-29 ENCOUNTER — Encounter (HOSPITAL_BASED_OUTPATIENT_CLINIC_OR_DEPARTMENT_OTHER): Payer: Self-pay | Admitting: Cardiology

## 2021-05-29 VITALS — BP 132/82 | HR 64 | Ht 70.0 in | Wt 247.9 lb

## 2021-05-29 DIAGNOSIS — I8311 Varicose veins of right lower extremity with inflammation: Secondary | ICD-10-CM

## 2021-05-29 DIAGNOSIS — R600 Localized edema: Secondary | ICD-10-CM

## 2021-05-29 DIAGNOSIS — Z7189 Other specified counseling: Secondary | ICD-10-CM

## 2021-05-29 DIAGNOSIS — I8312 Varicose veins of left lower extremity with inflammation: Secondary | ICD-10-CM

## 2021-05-29 NOTE — Patient Instructions (Signed)
Medication Instructions:  ?Your Physician recommend you continue on your current medication as directed.   ? ?*If you need a refill on your cardiac medications before your next appointment, please call your pharmacy* ? ? ?Lab Work: ?None ordered today ? ? ?Testing/Procedures: ?None ordered today ? ? ?Follow-Up: ?At [facility], you and your health needs are our priority.  As part of our continuing mission to provide you with exceptional heart care, we have created designated Provider Care Teams.  These Care Teams include your primary Cardiologist (physician) and Advanced Practice Providers (APPs -  Physician Assistants and Nurse Practitioners) who all work together to provide you with the care you need, when you need it. ? ?We recommend signing up for the patient portal called "MyChart".  Sign up information is provided on this After Visit Summary.  MyChart is used to connect with patients for Virtual Visits (Telemedicine).  Patients are able to view lab/test results, encounter notes, upcoming appointments, etc.  Non-urgent messages can be sent to your provider as well.   ?To learn more about what you can do with MyChart, go to [email or web address].   ? ?Your next appointment:   ?As needed ? ?The format for your next appointment:   ?In Person ? ?Provider:   ?Buford Dresser, MD{ ? ?Important Information About Sugar ? ? ? ? ? ? ?

## 2021-05-29 NOTE — Progress Notes (Signed)
Cardiology Office Note:    Date:  05/29/2021   ID:  Allen [name], DOB 04-21-1950, MRN [id number]  PCP:  Isaac Bliss, Rayford Halsted, MD  Cardiologist:  Buford Dresser, MD  Referring MD: Isaac Bliss, [place]*   CC: follow up  History of Present Illness:    Allen [name] is a 71 y.o. male with a hx of shortness of breath, LE edema who is seen for follow up today. I initially met him 05/26/19 as a new consult at the request of Isaac Bliss, [facility]* for the evaluation and management of shortness of breath, enlarged heart on CT scan.  Cardiovascular risk factors: Tobacco use history: former, quit >30 years ago Family history: father died of myocarditis at age 27, heart was good prior. No other heart issues. Many with lung issues. Mother died of COPD. Father and grandfather had bad asthma.  Prior cardiac testing and/or incidental findings on other testing: [place] several years ago, told his vessels were clear. Cath done due to shortness of breath and leg swelling. Noted enlarged heart on recent CT.  Has COPD and a chronic cough. Has OSA, uses CPAP.  Has chronic bilateral LE edema. Has had three prior vein surgeries, the earliest of which was in his 62s.  Today: Continues to have intermittent LE edema. He has tried compression stockings, which did not help. It is helped by elevation. Much worse on long drives, recently took an airplane trip to [place]. Notices any time his feet are hanging down, even with walking. Will go down by the morning.  We reviewed his prior echo, which does not suggest a cardiac etiology of his LE edema. Normal EF, only grade 1 diastolic dysfunction, no significant valve disease. Right atrial pressure was only 3 mmHg.  Peak weight was in the 260s, now down in the 240s. Off of prednisone. Watches salt intake, reviewed this today. E/e' does not suggest elevated LVEDP.  We discussed long term management options. He is concerned that this  seems to be progressing. As salt avoidance, compression not helping, discussed PRN diuretic and vascular surgery. He would like to see vascular to discuss other options.   Past Medical History:  Diagnosis Date   Anxiety    Arthritis    neck -   BPH with obstruction/lower urinary tract symptoms    urologist-- dr pace   Centrilobular emphysema [facility])    followed by dr Halford Chessman   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[facility])    followed by dr Halford Chessman   Cough 07/2020   covid residual, no congestion   DOE (dyspnea on exertion)    due to copd/ emphysema   ED (erectile dysfunction)    Essential tremor    neurologist--- dr tat----  bilateral upper extremities,  s/p DPS 12/ 2016   GERD (gastroesophageal reflux disease)    History of 2019 novel coronavirus disease (COVID-19) 07/18/2020   per pt postive home test 07-18-2020 w/ mild  symptoms per pt; pt stated had urgent care visit 07-24-2020 for sob/ cough with negative cxr (in care everywhere) ;  follow up pcp visit 08-03-2020 in epic   Insomnia    Mixed simple and mucopurulent chronic bronchitis (HCC)    OSA (obstructive sleep apnea)    followed by dr Halford Chessman---- no longer does uses bipap, intolerant --- pt prescriped  Oxygen  2 L oxygen via [place] while sleeping  (study in epic 04/ 2017 AHI 40)   Perennial allergic rhinitis    Pulmonary nodules  followed by dr Halford Chessman   S/P deep brain stimulator placement 12/2014   followed by neurology-----IPG change 01-21-2020 for end-of life battery  (pt has a control)   Varicose vein of leg    per pt has had 3 procedure's last one 2006   Wears dentures    upper    Past Surgical History:  Procedure Laterality Date   ANKLE SURGERY Right 2020   tendon repair   CARPAL TUNNEL RELEASE Left 2019   CATARACT EXTRACTION W/ INTRAOCULAR LENS IMPLANT Bilateral 2020   COLONOSCOPY     DEEP BRAIN STIMULATOR PLACEMENT Left 2016   INGUINAL HERNIA REPAIR Right 2007   ROTATOR CUFF REPAIR Right 2014   SUBTHALAMIC STIMULATOR BATTERY REPLACEMENT N/A  01/21/2020   Procedure: Deep brain stimulator battery replacement;  Surgeon: Erline Levine, MD;  Location: [place];  Service: Neurosurgery;  Laterality: N/A;   TONSILLECTOMY AND ADENOIDECTOMY     age 47   TOTAL SHOULDER REPLACEMENT Left 2016   TRANSURETHRAL RESECTION OF PROSTATE N/A 08/09/2020   Procedure: TRANSURETHRAL RESECTION OF THE PROSTATE (TURP), BLADDER BIOPSY AND FULGERATION;  Surgeon: Robley Fries, MD;  Location: [place];  Service: Urology;  Laterality: N/A;   VEIN SURGERY Left 2006   varicose veins    Current Medications: Current Outpatient Medications on File Prior to Visit  Medication Sig   albuterol (PROVENTIL) (2.5 MG/3ML) 0.083% nebulizer solution Take 3 mLs (2.5 mg total) by nebulization every 6 (six) hours.   albuterol (VENTOLIN HFA) 108 (90 Base) MCG/ACT inhaler INHALE 2 PUFFS INTO THE LUNGS EVERY 4 HOURS AS NEEDED FOR WHEEZE OR FOR SHORTNESS OF BREATH   azelastine (ASTELIN) 0.1 % nasal spray Place 1-2 sprays into both nostrils 2 (two) times daily as needed for rhinitis.   escitalopram (LEXAPRO) 20 MG tablet Take 1 tablet (20 mg total) by mouth daily.   fluticasone (FLONASE) 50 MCG/ACT nasal spray Place 2 sprays into both nostrils daily as needed for allergies or rhinitis.   Fluticasone-Umeclidin-Vilant (TRELEGY ELLIPTA) 100-62.5-25 MCG/ACT AEPB Inhale 1 puff into the lungs daily.   folic acid (FOLVITE) 790 MCG tablet Take 400 mcg by mouth daily.   hydrOXYzine (VISTARIL) 25 MG capsule Take 25 mg by mouth as needed.   mirabegron ER (MYRBETRIQ) 25 MG TB24 tablet Take 25 mg by mouth daily.   pantoprazole (PROTONIX) 40 MG tablet Take 1 tablet (40 mg total) by mouth daily.   predniSONE (DELTASONE) 20 MG tablet Take 2 tablets (40 mg total) by mouth daily with breakfast.   primidone (MYSOLINE) 50 MG tablet 2 in the AM, 1 in the PM   silodosin (RAPAFLO) 8 MG CAPS capsule Take 1 capsule (8 mg total) by mouth daily.   tadalafil (CIALIS) 20 MG tablet Take 1  tablet (20 mg total) by mouth daily as needed for erectile dysfunction. (Patient taking differently: Take 10 mg by mouth daily.)   traZODone (DESYREL) 100 MG tablet Take 1 tablet (100 mg total) by mouth at bedtime.   zolpidem (AMBIEN) 10 MG tablet Take 1 tablet (10 mg total) by mouth at bedtime as needed for sleep.   No current facility-administered medications on file prior to visit.     Allergies:   Patient has no known allergies.   Social History   Tobacco Use   Smoking status: Former    Packs/day: 3.00    Years: 20.00    Pack years: 60.00    Types: Cigarettes    Quit date: 1991  Years since quitting: 32.3   Smokeless tobacco: Never  Vaping Use   Vaping Use: Never used  Substance Use Topics   Alcohol use: Not Currently    Comment: occassionally   Drug use: Never    Family History: family history includes Asthma in his father; COPD in his mother; Healthy in his child; Rectal cancer in his sister. There is no history of Urticaria, Immunodeficiency, Eczema, Atopy, Angioedema, or Allergic rhinitis.  ROS:   Please see the history of present illness.  Additional pertinent ROS otherwise unremarkable.    EKGs/Labs/Other Studies Reviewed:    The following studies were reviewed today: Echo 05/29/19  1. Left ventricular ejection fraction, by estimation, is 60 to 65%. The  left ventricle has normal function. The left ventricle has no regional  wall motion abnormalities. There is mild concentric left ventricular  hypertrophy. Left ventricular diastolic  parameters are consistent with Grade I diastolic dysfunction (impaired  relaxation).   2. Right ventricular systolic function is normal. The right ventricular  size is normal. Tricuspid regurgitation signal is inadequate for assessing  PA pressure.   3. Left atrial size was moderately dilated.   4. The mitral valve is normal in structure. No evidence of mitral valve  regurgitation. No evidence of mitral stenosis.   5. The  aortic valve is normal in structure. Aortic valve regurgitation is  not visualized. No aortic stenosis is present.   6. The inferior vena cava is normal in size with greater than 50%  respiratory variability, suggesting right atrial pressure of 3 mmHg.   CT chest 05/04/19 Cardiovascular: Heart is enlarged.  No pericardial effusion  EKG:  EKG is personally reviewed.  05/29/21: NSR at 64 bpm with RSR'  Recent Labs: 06/15/2020: ALT 13; BUN 23; Creatinine, Ser 1.13; Hemoglobin 13.3; Platelets 295.0; Potassium 4.5; Sodium 143; TSH 1.69  Recent Lipid Panel    Component Value Date/Time   CHOL 174 06/15/2020 1404   TRIG 111.0 06/15/2020 1404   HDL 65.00 06/15/2020 1404   CHOLHDL 3 06/15/2020 1404   VLDL 22.2 06/15/2020 1404   LDLCALC 87 06/15/2020 1404    Physical Exam:    VS:  BP 132/82   Pulse 64   Ht $R'5\' 10"'Cv$  (1.778 m)   Wt 247 lb 14.4 oz (112.4 kg)   SpO2 97%   BMI 35.57 kg/m     Wt Readings from Last 3 Encounters:  05/31/21 242 lb 14.4 oz (110.2 kg)  05/29/21 247 lb 14.4 oz (112.4 kg)  05/08/21 243 lb (110.2 kg)    GEN: Well nourished, well developed in no acute distress HEENT: Normal, moist mucous membranes NECK: No JVD CARDIAC: regular rhythm, normal S1 and S2, no rubs or gallops. No murmurs. VASCULAR: Radial and DP pulses 2+ bilaterally. No carotid bruits RESPIRATORY:  Clear to auscultation without rales, wheezing or rhonchi  ABDOMEN: Soft, non-tender, non-distended MUSCULOSKELETAL:  Ambulates independently SKIN: Warm and dry, bilateral trace LE edema. +varicose veins NEUROLOGIC:  Alert and oriented x 3. No focal neuro deficits noted. PSYCHIATRIC:  Normal affect    ASSESSMENT:    1. Varicose veins of both lower extremities with inflammation   2. Bilateral leg edema   3. Cardiac risk counseling   4. Counseling on health promotion and disease prevention     PLAN:    Lower extremity edema Varicose veins -echo supports that this is not cardiac in etiology -we  discussed venous insufficiency at length, recommendations for management -he would like to see vascular to  discuss options/second opinion  Cardiac risk counseling and prevention recommendations: -recommend heart healthy/Mediterranean diet, with whole grains, fruits, vegetable, fish, lean meats, nuts, and olive oil. Limit salt. -recommend moderate walking, 3-5 times/week for 30-50 minutes each session. Aim for at least 150 minutes.week. Goal should be pace of 3 miles/hours, or walking 1.5 miles in 30 minutes -recommend avoidance of tobacco products. Avoid excess alcohol. -ASCVD risk score: The 10-year ASCVD risk score (Arnett DK, et al., 2019) is: 19%   Values used to calculate the score:     Age: 31 years     Sex: Male     Is Non-Hispanic African American: No     Diabetic: No     Tobacco smoker: No     Systolic Blood Pressure: 166 mmHg     Is BP treated: No     HDL Cholesterol: 65 mg/dL     Total Cholesterol: 174 mg/dL    Plan for follow up: as needed  Buford Dresser, MD, PhD [place]  [facility]    Medication Adjustments/Labs and Tests Ordered: Current medicines are reviewed at length with the patient today.  Concerns regarding medicines are outlined above.  Orders Placed This Encounter  Procedures   Ambulatory referral to Vascular Surgery   EKG 12-Lead   No orders of the defined types were placed in this encounter.   Patient Instructions  Medication Instructions:  Your Physician recommend you continue on your current medication as directed.    *If you need a refill on your cardiac medications before your next appointment, please call your pharmacy*   Lab Work: None ordered today   Testing/Procedures: None ordered today   Follow-Up: At [facility], you and your health needs are our priority.  As part of our continuing mission to provide you with exceptional heart care, we have created designated Provider Care Teams.  These Care Teams include your  primary Cardiologist (physician) and Advanced Practice Providers (APPs -  Physician Assistants and Nurse Practitioners) who all work together to provide you with the care you need, when you need it.  We recommend signing up for the patient portal called "MyChart".  Sign up information is provided on this After Visit Summary.  MyChart is used to connect with patients for Virtual Visits (Telemedicine).  Patients are able to view lab/test results, encounter notes, upcoming appointments, etc.  Non-urgent messages can be sent to your provider as well.   To learn more about what you can do with MyChart, go to [email or web address].    Your next appointment:   As needed  The format for your next appointment:   In Person  Provider:   Buford Dresser, MD{  Important Information About Sugar        Signed, Buford Dresser, MD PhD 05/29/2021    [place]

## 2021-05-31 ENCOUNTER — Encounter: Payer: Self-pay | Admitting: Internal Medicine

## 2021-05-31 ENCOUNTER — Ambulatory Visit (INDEPENDENT_AMBULATORY_CARE_PROVIDER_SITE_OTHER): Payer: Medicare HMO | Admitting: Internal Medicine

## 2021-05-31 VITALS — BP 120/70 | HR 74 | Temp 97.7°F | Wt 242.9 lb

## 2021-05-31 DIAGNOSIS — F411 Generalized anxiety disorder: Secondary | ICD-10-CM | POA: Diagnosis not present

## 2021-05-31 DIAGNOSIS — Z8719 Personal history of other diseases of the digestive system: Secondary | ICD-10-CM

## 2021-05-31 DIAGNOSIS — R1031 Right lower quadrant pain: Secondary | ICD-10-CM | POA: Diagnosis not present

## 2021-05-31 DIAGNOSIS — F5102 Adjustment insomnia: Secondary | ICD-10-CM | POA: Diagnosis not present

## 2021-05-31 DIAGNOSIS — F330 Major depressive disorder, recurrent, mild: Secondary | ICD-10-CM | POA: Diagnosis not present

## 2021-05-31 DIAGNOSIS — Z9889 Other specified postprocedural states: Secondary | ICD-10-CM | POA: Diagnosis not present

## 2021-05-31 NOTE — Progress Notes (Signed)
? ? ? ?Established Patient Office Visit ? ? ? ? ?CC/Reason for Visit: Right groin pain ? ?HPI: Allen [name] is a 71 y.o. male who is coming in today for the above mentioned reasons.  He had a right inguinal hernia repair in [place] in 2008.  For the past 2 weeks or so he has had significant right groin pain that worsens with Valsalva maneuvers.  He has not noted return of the hernia.  He is concerned that something is wrong with the mesh. ? ?Past Medical/Surgical History: ?Past Medical History:  ?Diagnosis Date  ? Anxiety   ? Arthritis   ? neck -  ? BPH with obstruction/lower urinary tract symptoms   ? urologist-- dr pace  ? Centrilobular emphysema ([place])   ? followed by dr Halford Chessman  ? 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([place])   ? followed by dr Halford Chessman  ? Cough 07/2020  ? covid residual, no congestion  ? DOE (dyspnea on exertion)   ? due to copd/ emphysema  ? ED (erectile dysfunction)   ? Essential tremor   ? neurologist--- dr tat----  bilateral upper extremities,  s/p DPS 12/ 2016  ? GERD (gastroesophageal reflux disease)   ? History of 2019 novel coronavirus disease (COVID-19) 07/18/2020  ? per pt postive home test 07-18-2020 w/ mild  symptoms per pt; pt stated had urgent care visit 07-24-2020 for sob/ cough with negative cxr (in care everywhere) ;  follow up pcp visit 08-03-2020 in epic  ? Insomnia   ? Mixed simple and mucopurulent chronic bronchitis ([place])   ? OSA (obstructive sleep apnea)   ? followed by dr Halford Chessman---- no longer does uses bipap, intolerant --- pt prescriped  Oxygen  2 L oxygen via [place] while sleeping  (study in epic 04/ 2017 AHI 40)  ? Perennial allergic rhinitis   ? Pulmonary nodules   ? followed by dr Halford Chessman  ? S/P deep brain stimulator placement 12/2014  ? followed by neurology-----IPG change 01-21-2020 for end-of life battery  (pt has a control)  ? Varicose vein of leg   ? per pt has had 3 procedure's last one 2006  ? Wears dentures   ? upper  ? ? ?Past Surgical History:  ?Procedure Laterality Date  ? ANKLE SURGERY Right  2020  ? tendon repair  ? CARPAL TUNNEL RELEASE Left 2019  ? CATARACT EXTRACTION W/ INTRAOCULAR LENS IMPLANT Bilateral 2020  ? COLONOSCOPY    ? DEEP BRAIN STIMULATOR PLACEMENT Left 2016  ? INGUINAL HERNIA REPAIR Right 2007  ? ROTATOR CUFF REPAIR Right 2014  ? SUBTHALAMIC STIMULATOR BATTERY REPLACEMENT N/A 01/21/2020  ? Procedure: Deep brain stimulator battery replacement;  Surgeon: Erline Levine, MD;  Location: [place];  Service: Neurosurgery;  Laterality: N/A;  ? TONSILLECTOMY AND ADENOIDECTOMY    ? age 89  ? TOTAL SHOULDER REPLACEMENT Left 2016  ? TRANSURETHRAL RESECTION OF PROSTATE N/A 08/09/2020  ? Procedure: TRANSURETHRAL RESECTION OF THE PROSTATE (TURP), BLADDER BIOPSY AND FULGERATION;  Surgeon: Robley Fries, MD;  Location: [place];  Service: Urology;  Laterality: N/A;  ? VEIN SURGERY Left 2006  ? varicose veins  ? ? ?Social History: ? reports that he quit smoking about 32 years ago. His smoking use included cigarettes. He has a 60.00 pack-year smoking history. He has never used smokeless tobacco. He reports that he does not currently use alcohol. He reports that he does not use drugs. ? ?Allergies: ?No Known Allergies ? ?Family History:  ?Family History  ?Problem  Relation Age of Onset  ? COPD Mother   ? Asthma Father   ? Rectal cancer Sister   ? Healthy Child   ? Urticaria Neg Hx   ? Immunodeficiency Neg Hx   ? Eczema Neg Hx   ? Atopy Neg Hx   ? Angioedema Neg Hx   ? Allergic rhinitis Neg Hx   ? ? ? ?Current Outpatient Medications:  ?  albuterol (PROVENTIL) (2.5 MG/3ML) 0.083% nebulizer solution, Take 3 mLs (2.5 mg total) by nebulization every 6 (six) hours., Disp: 150 mL, Rfl: 3 ?  albuterol (VENTOLIN HFA) 108 (90 Base) MCG/ACT inhaler, INHALE 2 PUFFS INTO THE LUNGS EVERY 4 HOURS AS NEEDED FOR WHEEZE OR FOR SHORTNESS OF BREATH, Disp: 8.5 each, Rfl: 0 ?  azelastine (ASTELIN) 0.1 % nasal spray, Place 1-2 sprays into both nostrils 2 (two) times daily as needed for rhinitis., Disp: 30 mL,  Rfl: 5 ?  escitalopram (LEXAPRO) 20 MG tablet, Take 1 tablet (20 mg total) by mouth daily., Disp: 90 tablet, Rfl: 1 ?  fluticasone (FLONASE) 50 MCG/ACT nasal spray, Place 2 sprays into both nostrils daily as needed for allergies or rhinitis., Disp: 16 g, Rfl: 5 ?  Fluticasone-Umeclidin-Vilant (TRELEGY ELLIPTA) 100-62.5-25 MCG/ACT AEPB, Inhale 1 puff into the lungs daily., Disp: 60 each, Rfl: 0 ?  hydrOXYzine (VISTARIL) 25 MG capsule, Take 25 mg by mouth as needed., Disp: , Rfl:  ?  mirabegron ER (MYRBETRIQ) 25 MG TB24 tablet, Take 25 mg by mouth daily., Disp: , Rfl:  ?  pantoprazole (PROTONIX) 40 MG tablet, Take 1 tablet (40 mg total) by mouth daily., Disp: 90 tablet, Rfl: 1 ?  primidone (MYSOLINE) 50 MG tablet, 2 in the AM, 1 in the PM, Disp: 270 tablet, Rfl: 1 ?  silodosin (RAPAFLO) 8 MG CAPS capsule, Take 1 capsule (8 mg total) by mouth daily., Disp: 30 capsule, Rfl: 1 ?  tadalafil (CIALIS) 20 MG tablet, Take 1 tablet (20 mg total) by mouth daily as needed for erectile dysfunction. (Patient taking differently: Take 10 mg by mouth daily.), Disp: 90 tablet, Rfl: 3 ?  traZODone (DESYREL) 100 MG tablet, Take 1 tablet (100 mg total) by mouth at bedtime., Disp: 90 tablet, Rfl: 1 ?  zolpidem (AMBIEN) 10 MG tablet, Take 1 tablet (10 mg total) by mouth at bedtime as needed for sleep., Disp: 90 tablet, Rfl: 1 ? ?Review of Systems:  ?Constitutional: Denies fever, chills, diaphoresis, appetite change and fatigue.  ?HEENT: Denies photophobia, eye pain, redness, hearing loss, ear pain, congestion, sore throat, rhinorrhea, sneezing, mouth sores, trouble swallowing, neck pain, neck stiffness and tinnitus.   ?Respiratory: Denies SOB, DOE, cough, chest tightness,  and wheezing.   ?Cardiovascular: Denies chest pain, palpitations and leg swelling.  ?Gastrointestinal: Denies nausea, vomiting, diarrhea, constipation, blood in stool and abdominal distention.  ?Genitourinary: Denies dysuria, urgency, frequency, hematuria, flank pain and  difficulty urinating.  ?Endocrine: Denies: hot or cold intolerance, sweats, changes in hair or nails, polyuria, polydipsia. ?Musculoskeletal: Denies myalgias, back pain, joint swelling, arthralgias and gait problem.  ?Skin: Denies pallor, rash and wound.  ?Neurological: Denies dizziness, seizures, syncope, weakness, light-headedness, numbness and headaches.  ?Hematological: Denies adenopathy. Easy bruising, personal or family bleeding history  ?Psychiatric/Behavioral: Denies suicidal ideation, mood changes, confusion, nervousness, sleep disturbance and agitation ? ? ? ?Physical Exam: ?Vitals:  ? 05/31/21 0821  ?BP: 120/70  ?Pulse: 74  ?Temp: 97.7 ?F (36.5 ?C)  ?TempSrc: Oral  ?SpO2: 98%  ?Weight: 242 lb 14.4 oz (110.2 kg)  ? ? ?  Body mass index is 34.85 kg/m?. ? ? ?Constitutional: NAD, calm, comfortable ?Eyes: PERRL, lids and conjunctivae normal, wears corrective lenses ?ENMT: Mucous membranes are moist.  ?Abdomen: no tenderness, no masses palpated. No hepatosplenomegaly. Bowel sounds positive.  No bulging of right inguinal hernia with bearing down and coughing. ?Psychiatric: Normal judgment and insight. Alert and oriented x 3. Normal mood.  ? ? ?Impression and Plan: ? ?Right groin pain - Plan: Ambulatory referral to General Surgery ? ?H/O right inguinal hernia repair ? ?-He requests general surgery referral for evaluation. ? ?Time spent:21 minutes reviewing chart, interviewing and examining patient and formulating plan of care. ? ? ? ?Lelon Frohlich, MD ?[facility] ? ? ?

## 2021-06-01 ENCOUNTER — Encounter: Payer: Medicare HMO | Admitting: Dermatology

## 2021-06-03 DIAGNOSIS — H00022 Hordeolum internum right lower eyelid: Secondary | ICD-10-CM | POA: Diagnosis not present

## 2021-06-05 ENCOUNTER — Ambulatory Visit (HOSPITAL_COMMUNITY)
Admission: RE | Admit: 2021-06-05 | Discharge: 2021-06-05 | Disposition: A | Discharge status: Home / Self Care | Payer: Medicare HMO | Source: Ambulatory Visit | Attending: Neurological Surgery | Admitting: Neurological Surgery

## 2021-06-05 DIAGNOSIS — M4712 Other spondylosis with myelopathy, cervical region: Secondary | ICD-10-CM | POA: Diagnosis not present

## 2021-06-05 DIAGNOSIS — M47812 Spondylosis without myelopathy or radiculopathy, cervical region: Secondary | ICD-10-CM | POA: Diagnosis not present

## 2021-06-05 NOTE — Progress Notes (Deleted)
[place] Phone: [phone] Subjective:    I'm seeing this patient by the request  of:  Erline Hau, MD  CC:   [id number]  03/15/2021 Significant arthritic changes noted and patient does have an MRI from March 2022 showing severe spinal stenosis at the C3-C4 level facet arthropathy in multiple other areas.  Patient does have a travel coming up and we will have him get an epidural at this time.  Likely no radicular symptoms.  Patient does describe a little bit of some tingling in his foot but did not think that this would correspond to it.  No significant weakness noted as well.  Patient knows if he does take the prednisone which I gave him a wait-and-see prescription because [place] will be [place] of [place] do not take any anti-inflammatories.  Patient will follow-up as stated.  Follow-up with me again in 6 to 8 weeks  Update 06/06/2021 Allen [name] is a 71 y.o. male coming in with complaint of DDD, cervical. Epidural 05/25/2021. MRI 06/05/2021. Patient states      Past Medical History:  Diagnosis Date   Anxiety    Arthritis    neck -   BPH with obstruction/lower urinary tract symptoms    urologist-- dr pace   Centrilobular emphysema [facility])    followed by dr Halford Chessman   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[facility])    followed by dr Halford Chessman   Cough 07/2020   covid residual, no congestion   DOE (dyspnea on exertion)    due to copd/ emphysema   ED (erectile dysfunction)    Essential tremor    neurologist--- dr tat----  bilateral upper extremities,  s/p DPS 12/ 2016   GERD (gastroesophageal reflux disease)    History of 2019 novel coronavirus disease (COVID-19) 07/18/2020   per pt postive home test 07-18-2020 w/ mild  symptoms per pt; pt stated had urgent care visit 07-24-2020 for sob/ cough with negative cxr (in care everywhere) ;  follow up pcp visit 08-03-2020 in epic   Insomnia    Mixed simple and mucopurulent  chronic bronchitis (HCC)    OSA (obstructive sleep apnea)    followed by dr Halford Chessman---- no longer does uses bipap, intolerant --- pt prescriped  Oxygen  2 L oxygen via [place] while sleeping  (study in epic 04/ 2017 [facility] 40)   Perennial allergic rhinitis    Pulmonary nodules    followed by dr Halford Chessman   S/P deep brain stimulator placement 12/2014   followed by neurology-----IPG change 01-21-2020 for end-of life battery  (pt has a control)   Varicose vein of leg    per pt has had 3 procedure's last one 2006   Wears dentures    upper   Past Surgical History:  Procedure Laterality Date   ANKLE SURGERY Right 2020   tendon repair   CARPAL TUNNEL RELEASE Left 2019   CATARACT EXTRACTION W/ INTRAOCULAR LENS IMPLANT Bilateral 2020   COLONOSCOPY     DEEP BRAIN STIMULATOR PLACEMENT Left 2016   INGUINAL HERNIA REPAIR Right 2007   ROTATOR CUFF REPAIR Right 2014   SUBTHALAMIC STIMULATOR BATTERY REPLACEMENT N/A 01/21/2020   Procedure: Deep brain stimulator battery replacement;  Surgeon: Erline Levine, MD;  Location: [place];  Service: Neurosurgery;  Laterality: N/A;   TONSILLECTOMY AND ADENOIDECTOMY     age 54   TOTAL SHOULDER REPLACEMENT Left 2016   TRANSURETHRAL RESECTION OF PROSTATE N/A 08/09/2020  Procedure: TRANSURETHRAL RESECTION OF THE PROSTATE (TURP), BLADDER BIOPSY AND FULGERATION;  Surgeon: Robley Fries, MD;  Location: [place];  Service: Urology;  Laterality: N/A;   VEIN SURGERY Left 2006   varicose veins   Social History   Socioeconomic History   Marital status: Married    Spouse name: Not on file   Number of children: 3   Years of education: Not on file   Highest education level: Associate degree: occupational, Hotel manager, or vocational program  Occupational History   Not on file  Tobacco Use   Smoking status: Former    Packs/day: 3.00    Years: 20.00    Pack years: 60.00    Types: Cigarettes    Quit date: 1991    Years since quitting: 32.4   Smokeless tobacco:  Never  Vaping Use   Vaping Use: Never used  Substance and Sexual Activity   Alcohol use: Not Currently    Comment: occassionally   Drug use: Never   Sexual activity: Yes  Other Topics Concern   Not on file  Social History Narrative   Not on file   Social Determinants of Health   Financial Resource Strain: Low Risk    Difficulty of Paying Living Expenses: Not hard at all  Food Insecurity: Not on file  Transportation Needs: No Transportation Needs   Lack of Transportation (Medical): No   Lack of Transportation (Non-Medical): No  Physical Activity: Not on file  Stress: Not on file  Social Connections: Not on file   No Known Allergies Family History  Problem Relation Age of Onset   COPD Mother    Asthma Father    Rectal cancer Sister    Healthy Child    Urticaria Neg Hx    Immunodeficiency Neg Hx    Eczema Neg Hx    Atopy Neg Hx    Angioedema Neg Hx    Allergic rhinitis Neg Hx      Current Outpatient Medications (Cardiovascular):    tadalafil (CIALIS) 20 MG tablet, Take 1 tablet (20 mg total) by mouth daily as needed for erectile dysfunction. (Patient taking differently: Take 10 mg by mouth daily.)  Current Outpatient Medications (Respiratory):    albuterol (PROVENTIL) (2.5 MG/3ML) 0.083% nebulizer solution, Take 3 mLs (2.5 mg total) by nebulization every 6 (six) hours.   albuterol (VENTOLIN HFA) 108 (90 Base) MCG/ACT inhaler, INHALE 2 PUFFS INTO THE LUNGS EVERY 4 HOURS AS NEEDED FOR WHEEZE OR FOR SHORTNESS OF BREATH   azelastine (ASTELIN) 0.1 % nasal spray, Place 1-2 sprays into both nostrils 2 (two) times daily as needed for rhinitis.   fluticasone (FLONASE) 50 MCG/ACT nasal spray, Place 2 sprays into both nostrils daily as needed for allergies or rhinitis.   Fluticasone-Umeclidin-Vilant (TRELEGY ELLIPTA) 100-62.5-25 MCG/ACT AEPB, Inhale 1 puff into the lungs daily.    Current Outpatient Medications (Other):    escitalopram (LEXAPRO) 20 MG tablet, Take 1 tablet (20  mg total) by mouth daily.   hydrOXYzine (VISTARIL) 25 MG capsule, Take 25 mg by mouth as needed.   mirabegron ER (MYRBETRIQ) 25 MG TB24 tablet, Take 25 mg by mouth daily.   pantoprazole (PROTONIX) 40 MG tablet, Take 1 tablet (40 mg total) by mouth daily.   primidone (MYSOLINE) 50 MG tablet, 2 in the AM, 1 in the PM   silodosin (RAPAFLO) 8 MG CAPS capsule, Take 1 capsule (8 mg total) by mouth daily.   traZODone (DESYREL) 100 MG tablet, Take 1 tablet (100 mg  total) by mouth at bedtime.   zolpidem (AMBIEN) 10 MG tablet, Take 1 tablet (10 mg total) by mouth at bedtime as needed for sleep.   Reviewed prior external information including notes and imaging from  primary care provider As well as notes that were available from care everywhere and other healthcare systems.  Past medical history, social, surgical and family history all reviewed in electronic medical record.  No pertanent information unless stated regarding to the chief complaint.   Review of Systems:  No headache, visual changes, nausea, vomiting, diarrhea, constipation, dizziness, abdominal pain, skin rash, fevers, chills, night sweats, weight loss, swollen lymph nodes, body aches, joint swelling, chest pain, shortness of breath, mood changes. POSITIVE muscle aches  Objective  There were no vitals taken for this visit.   General: No apparent distress alert and oriented x3 mood and affect normal, dressed appropriately.  HEENT: Pupils equal, extraocular movements intact  Respiratory: Patient's speak in full sentences and does not appear short of breath  Cardiovascular: No lower extremity edema, non tender, no erythema  Gait normal with good balance and coordination.  MSK:  Non tender with full range of motion and good stability and symmetric strength and tone of shoulders, elbows, wrist, hip, knee and ankles bilaterally.     Impression and Recommendations:     The above documentation has been reviewed and is accurate and  complete Jacqualin Combes

## 2021-06-06 ENCOUNTER — Ambulatory Visit: Payer: Medicare HMO | Admitting: Family Medicine

## 2021-06-07 ENCOUNTER — Encounter: Payer: Self-pay | Admitting: *Deleted

## 2021-06-07 DIAGNOSIS — Z6835 Body mass index (BMI) 35.0-35.9, adult: Secondary | ICD-10-CM | POA: Diagnosis not present

## 2021-06-07 DIAGNOSIS — M542 Cervicalgia: Secondary | ICD-10-CM | POA: Diagnosis not present

## 2021-06-08 ENCOUNTER — Other Ambulatory Visit: Payer: Self-pay | Admitting: Internal Medicine

## 2021-06-08 DIAGNOSIS — F339 Major depressive disorder, recurrent, unspecified: Secondary | ICD-10-CM

## 2021-06-09 ENCOUNTER — Encounter: Payer: Self-pay | Admitting: Podiatry

## 2021-06-09 ENCOUNTER — Ambulatory Visit: Payer: Medicare HMO | Admitting: Podiatry

## 2021-06-09 DIAGNOSIS — M79674 Pain in right toe(s): Secondary | ICD-10-CM

## 2021-06-09 DIAGNOSIS — M79675 Pain in left toe(s): Secondary | ICD-10-CM

## 2021-06-09 DIAGNOSIS — B351 Tinea unguium: Secondary | ICD-10-CM | POA: Diagnosis not present

## 2021-06-09 NOTE — Progress Notes (Signed)
This patient returns to the office for evaluation and treatment of long thick painful nails .  This patient is unable to trim his own nails since the patient cannot reach his feet.  Patient says the nails are painful walking and wearing his shoes.  He returns for preventive foot care services.  General Appearance  Alert, conversant and in no acute stress.  Vascular  Dorsalis pedis and posterior tibial  pulses are palpable  bilaterally.  Capillary return is within normal limits  bilaterally. Temperature is within normal limits  bilaterally.  Neurologic  Senn-Weinstein monofilament wire test within normal limits  bilaterally. Muscle power within normal limits bilaterally.  Nails Thick disfigured discolored nails with subungual debris  from hallux to fifth toes bilaterally.  Hallux nails are especially thickened and split and painful   No evidence of bacterial infection or drainage bilaterally.  Orthopedic  No limitations of motion  feet .  No crepitus or effusions noted.  No bony pathology or digital deformities noted.  Skin  normotropic skin with no porokeratosis noted bilaterally.  No signs of infections or ulcers noted.     Onychomycosis  Pain in toes right foot  Pain in toes left foot  Debridement  of nails  1-5  B/L with a nail nipper.  Nails were then filed using a dremel tool with no incidents.    RTC 3 months    Gardiner Barefoot DPM

## 2021-06-14 ENCOUNTER — Encounter (HOSPITAL_BASED_OUTPATIENT_CLINIC_OR_DEPARTMENT_OTHER): Payer: Self-pay

## 2021-06-27 ENCOUNTER — Other Ambulatory Visit (HOSPITAL_BASED_OUTPATIENT_CLINIC_OR_DEPARTMENT_OTHER): Payer: Medicare HMO | Admitting: Pulmonary Disease

## 2021-07-06 ENCOUNTER — Encounter: Payer: Medicare HMO | Admitting: Internal Medicine

## 2021-07-07 ENCOUNTER — Other Ambulatory Visit: Payer: Self-pay | Admitting: *Deleted

## 2021-07-07 DIAGNOSIS — I8393 Asymptomatic varicose veins of bilateral lower extremities: Secondary | ICD-10-CM

## 2021-07-10 ENCOUNTER — Ambulatory Visit (HOSPITAL_BASED_OUTPATIENT_CLINIC_OR_DEPARTMENT_OTHER): Payer: Medicare HMO | Attending: Nurse Practitioner | Admitting: Pulmonary Disease

## 2021-07-10 DIAGNOSIS — G4733 Obstructive sleep apnea (adult) (pediatric): Secondary | ICD-10-CM

## 2021-07-10 DIAGNOSIS — M47812 Spondylosis without myelopathy or radiculopathy, cervical region: Secondary | ICD-10-CM | POA: Diagnosis not present

## 2021-07-10 DIAGNOSIS — Z9689 Presence of other specified functional implants: Secondary | ICD-10-CM | POA: Diagnosis not present

## 2021-07-10 DIAGNOSIS — G250 Essential tremor: Secondary | ICD-10-CM | POA: Diagnosis not present

## 2021-07-12 ENCOUNTER — Other Ambulatory Visit: Payer: Self-pay | Admitting: Pulmonary Disease

## 2021-07-12 ENCOUNTER — Encounter: Payer: Self-pay | Admitting: Internal Medicine

## 2021-07-17 NOTE — Procedures (Signed)
     BiPAP mask refitting.  Provided with Fisher [employer]kel Eson2 medium nasal mask and [employer]kel Evora large full face mask.  [place] PH: [phone]   FX: [phone] [place]

## 2021-07-17 NOTE — Progress Notes (Signed)
Assessment/Plan:    1.  Essential Tremor  Patient had DBS surgery on the left brain in [place] in 2016.  Patient status post IPG change in January, 2022.  DBS was adjusted today and did help the right hand.  -Patient was on primidone and found it somewhat useful for the left hand.  He stated that the pharmacist at [facility] is now refusing to send it to him, stating that it interacts with his psychiatric medication.  I am not sure what the interaction is.  He is only on Wellbutrin and Lexapro and I cannot find a major interaction with these.  The pharmacist did not contact us.  We will try to contact them.  -In the meantime, the patient very much wants to start on something else.  Discussed with him that he has already been on propranolol and gabapentin.  He wanted to try Topamax, not only because he thought it would be helpful for tremor, but also because neurosurgery has suggested it for his pain.  Discussed extensively risk, benefits, and side effects including but not limited to cognitive change, paresthesias, nephrolithiasis.  He will start with 25 mg, 1 tablet daily for 1 week, then 2 tablets daily for 1 week and then 3 tablets daily for 1 week and then increase to 100 mg daily.   2.  COPD  -Following with Dr. Halford Chessman.  On meds which could contribute to tremors.  3.  Back and neck pain  -Following with Dr. Reatha Armour and Dr. Davy Pique.  He is following up next week and hopeful to have further procedures.  This is his primary complaint today.  Subjective:   Allen [name] was seen today in follow up for essential tremor.  My previous records were reviewed prior to todays visit.  Patient's primidone was increased last visit (in addition to the DBS device).  He reports he is tolerating it well.  He saw Dr. Reatha Armour since our last visit, who ultimately referred him to Dr. Davy Pique for cervical medial branch blocks.  That hasn't helped.  He states that they are considering what sounds like rhizotomy  but are worried about the DBS lead.  He has an appt next week to see if they will do it.  States that the R hand tremor is intermittent.  It is good today.    He did fall in February.  was visiting grandkids in [place] and tripped and fell and hit his face.  Had CT brain.  Was neg.    Current prescribed movement disorder medications: Primidone, 50 mg, 2 tablets in the morning and 1 tablet at night (pt reports not on it; stated that pharmacist at [facility] told him it interacted with his psychiatric medicine and they refused to send it to him)    PREVIOUS MEDICATIONS:  primidone (pt wanted to restart but felt caused ED at low dose), propranolol, gabapentin; not beta-blocker candidate now because of asthma  ALLERGIES:   Allergies  Allergen Reactions   Copper-Containing Compounds     Copper gloves caused redness and blisters bilateral hands    CURRENT MEDICATIONS:  Outpatient Encounter Medications as of 07/31/2021  Medication Sig   albuterol (PROVENTIL) (2.5 MG/3ML) 0.083% nebulizer solution Take 3 mLs (2.5 mg total) by nebulization every 6 (six) hours.   albuterol (VENTOLIN HFA) 108 (90 Base) MCG/ACT inhaler INHALE 2 PUFFS INTO THE LUNGS EVERY 4 HOURS AS NEEDED FOR WHEEZE OR FOR SHORTNESS OF BREATH   azelastine (ASTELIN) 0.1 % nasal spray Place 1-2  sprays into both nostrils 2 (two) times daily as needed for rhinitis.   buPROPion (WELLBUTRIN XL) 150 MG 24 hr tablet Take 150 mg by mouth every morning.   clonazePAM (KLONOPIN) 0.5 MG tablet Take 0.5 mg by mouth daily as needed.   erythromycin ophthalmic ointment SMARTSIG:sparingly Right Eye Twice Daily   escitalopram (LEXAPRO) 20 MG tablet Take 1 tablet (20 mg total) by mouth daily.   fluticasone (FLONASE) 50 MCG/ACT nasal spray Place 2 sprays into both nostrils daily as needed for allergies or rhinitis.   Fluticasone-Umeclidin-Vilant (TRELEGY ELLIPTA) 100-62.5-25 MCG/ACT AEPB Inhale 1 puff into the lungs daily.   hydrOXYzine (VISTARIL) 25 MG  capsule Take 25 mg by mouth as needed.   mirabegron ER (MYRBETRIQ) 25 MG TB24 tablet Take 25 mg by mouth daily.   montelukast (SINGULAIR) 10 MG tablet Take 10 mg by mouth daily.   pantoprazole (PROTONIX) 40 MG tablet Take 1 tablet (40 mg total) by mouth daily.   silodosin (RAPAFLO) 8 MG CAPS capsule Take 1 capsule (8 mg total) by mouth daily.   tadalafil (CIALIS) 20 MG tablet Take 1 tablet (20 mg total) by mouth daily as needed for erectile dysfunction. (Patient taking differently: Take 10 mg by mouth daily.)   tamsulosin (FLOMAX) 0.4 MG CAPS capsule    tiZANidine (ZANAFLEX) 2 MG tablet Take 1 tablet (2 mg total) by mouth 3 (three) times daily.   traZODone (DESYREL) 100 MG tablet TAKE 1 TABLET AT BEDTIME   zaleplon (SONATA) 10 MG capsule Take 10 mg by mouth at bedtime as needed.   zolpidem (AMBIEN) 10 MG tablet Take 1 tablet (10 mg total) by mouth at bedtime as needed for sleep.   [EXPIRED] azithromycin (ZITHROMAX) 250 MG tablet TAKE 2 TABLETS (500 MG TOTAL) BY MOUTH DAILY FOR 1 DAY, THEN 1 TABLET (250 MG TOTAL) DAILY FOR 4 DAYS.   primidone (MYSOLINE) 50 MG tablet 2 in the AM, 1 in the PM (Patient not taking: Reported on 07/31/2021)   No facility-administered encounter medications on file as of 07/31/2021.     Objective:    PHYSICAL EXAMINATION:    VITALS:   Vitals:   07/31/21 1250  BP: 104/67  Pulse: 88  SpO2: 93%  Weight: 252 lb (114.3 kg)  Height: 5' 9.75" (1.772 m)   Wt Readings from Last 3 Encounters:  07/31/21 252 lb (114.3 kg)  07/25/21 252 lb 3.2 oz (114.4 kg)  07/20/21 254 lb 1.6 oz (115.3 kg)       GEN:  The patient appears stated age and is in NAD. HEENT:  Normocephalic, atraumatic.  The mucous membranes are moist. The superficial temporal arteries are without ropiness or tenderness.   Neurological examination:  Orientation: The patient is alert and oriented x3. Cranial nerves: There is good facial symmetry. The speech is fluent and clear. Soft palate rises  symmetrically and there is no tongue deviation. Hearing is intact to conversational tone. Sensation: Sensation is intact to light touch throughout Motor: Strength is at least antigravity x4.  Movement examination: Tone: There is normal tone in the UE/LE Abnormal movements: Prior to programming, he had no postural tremor on the right hand, but did have postural tremor (mild to moderate) on the left hand.  On the right hand, he did have mild to moderate intention tremor when holding something of weight prior to programming.  Post programming that was resolved.  I have reviewed and interpreted the following labs independently   Chemistry      Component  Value Date/Time   NA 138 07/20/2021 1010   K 4.5 07/20/2021 1010   CL 103 07/20/2021 1010   CO2 27 07/20/2021 1010   BUN 27 (H) 07/20/2021 1010   CREATININE 1.17 07/20/2021 1010      Component Value Date/Time   CALCIUM 9.5 07/20/2021 1010   ALKPHOS 46 07/20/2021 1010   AST 21 07/20/2021 1010   ALT 20 07/20/2021 1010   BILITOT 0.5 07/20/2021 1010      Lab Results  Component Value Date   WBC 7.2 07/20/2021   HGB 12.9 (L) 07/20/2021   HCT 39.2 07/20/2021   MCV 96.3 07/20/2021   PLT 284.0 07/20/2021   Lab Results  Component Value Date   TSH 1.95 07/20/2021     Chemistry      Component Value Date/Time   NA 138 07/20/2021 1010   K 4.5 07/20/2021 1010   CL 103 07/20/2021 1010   CO2 27 07/20/2021 1010   BUN 27 (H) 07/20/2021 1010   CREATININE 1.17 07/20/2021 1010      Component Value Date/Time   CALCIUM 9.5 07/20/2021 1010   ALKPHOS 46 07/20/2021 1010   AST 21 07/20/2021 1010   ALT 20 07/20/2021 1010   BILITOT 0.5 07/20/2021 1010         Total time spent on today's visit was 20 minutes, including both face-to-face time and nonface-to-face time.  Time included that spent on review of records (prior notes available to me/labs/imaging if pertinent), discussing treatment and goals, answering patient's questions and  coordinating care.  This was separate from the DBS time.  Cc:  Isaac Bliss, Rayford Halsted, MD

## 2021-07-20 ENCOUNTER — Encounter: Payer: Self-pay | Admitting: Internal Medicine

## 2021-07-20 ENCOUNTER — Ambulatory Visit (INDEPENDENT_AMBULATORY_CARE_PROVIDER_SITE_OTHER): Payer: Medicare HMO | Admitting: Internal Medicine

## 2021-07-20 VITALS — BP 122/82 | HR 75 | Temp 98.0°F | Ht 69.75 in | Wt 254.1 lb

## 2021-07-20 DIAGNOSIS — G4733 Obstructive sleep apnea (adult) (pediatric): Secondary | ICD-10-CM | POA: Diagnosis not present

## 2021-07-20 DIAGNOSIS — Z9689 Presence of other specified functional implants: Secondary | ICD-10-CM

## 2021-07-20 DIAGNOSIS — E559 Vitamin D deficiency, unspecified: Secondary | ICD-10-CM | POA: Diagnosis not present

## 2021-07-20 DIAGNOSIS — J4540 Moderate persistent asthma, uncomplicated: Secondary | ICD-10-CM | POA: Diagnosis not present

## 2021-07-20 DIAGNOSIS — N401 Enlarged prostate with lower urinary tract symptoms: Secondary | ICD-10-CM | POA: Diagnosis not present

## 2021-07-20 DIAGNOSIS — Z0000 Encounter for general adult medical examination without abnormal findings: Secondary | ICD-10-CM | POA: Diagnosis not present

## 2021-07-20 DIAGNOSIS — G250 Essential tremor: Secondary | ICD-10-CM

## 2021-07-20 DIAGNOSIS — M5030 Other cervical disc degeneration, unspecified cervical region: Secondary | ICD-10-CM | POA: Diagnosis not present

## 2021-07-20 DIAGNOSIS — Z1159 Encounter for screening for other viral diseases: Secondary | ICD-10-CM

## 2021-07-20 DIAGNOSIS — I5032 Chronic diastolic (congestive) heart failure: Secondary | ICD-10-CM | POA: Diagnosis not present

## 2021-07-20 DIAGNOSIS — N138 Other obstructive and reflux uropathy: Secondary | ICD-10-CM

## 2021-07-20 LAB — CBC WITH DIFFERENTIAL/PLATELET
Basophils Absolute: 0.1 10*3/uL (ref 0.0–0.1)
Basophils Relative: 0.7 % (ref 0.0–3.0)
Eosinophils Absolute: 0.1 10*3/uL (ref 0.0–0.7)
Eosinophils Relative: 1.3 % (ref 0.0–5.0)
HCT: 39.2 % (ref 39.0–52.0)
Hemoglobin: 12.9 g/dL — ABNORMAL LOW (ref 13.0–17.0)
Lymphocytes Relative: 13.5 % (ref 12.0–46.0)
Lymphs Abs: 1 10*3/uL (ref 0.7–4.0)
MCHC: 32.9 g/dL (ref 30.0–36.0)
MCV: 96.3 fl (ref 78.0–100.0)
Monocytes Absolute: 0.7 10*3/uL (ref 0.1–1.0)
Monocytes Relative: 10.3 % (ref 3.0–12.0)
Neutro Abs: 5.3 10*3/uL (ref 1.4–7.7)
Neutrophils Relative %: 74.2 % (ref 43.0–77.0)
Platelets: 284 10*3/uL (ref 150.0–400.0)
RBC: 4.07 Mil/uL — ABNORMAL LOW (ref 4.22–5.81)
RDW: 15.4 % (ref 11.5–15.5)
WBC: 7.2 10*3/uL (ref 4.0–10.5)

## 2021-07-20 LAB — LIPID PANEL
Cholesterol: 194 mg/dL (ref 0–200)
HDL: 67.2 mg/dL (ref >39.00)
LDL Cholesterol: 96 mg/dL (ref 0–99)
NonHDL: 126.84
Total CHOL/HDL Ratio: 3
Triglycerides: 154 mg/dL — ABNORMAL HIGH (ref 0.0–149.0)
VLDL: 30.8 mg/dL (ref 0.0–40.0)

## 2021-07-20 LAB — TSH: TSH: 1.95 u[IU]/mL (ref 0.35–5.50)

## 2021-07-20 LAB — COMPREHENSIVE METABOLIC PANEL
ALT: 20 U/L (ref 0–53)
AST: 21 U/L (ref 0–37)
Albumin: 4.5 g/dL (ref 3.5–5.2)
Alkaline Phosphatase: 46 U/L (ref 39–117)
BUN: 27 mg/dL — ABNORMAL HIGH (ref 6–23)
CO2: 27 mEq/L (ref 19–32)
Calcium: 9.5 mg/dL (ref 8.4–10.5)
Chloride: 103 mEq/L (ref 96–112)
Creatinine, Ser: 1.17 mg/dL (ref 0.40–1.50)
GFR: 62.94 mL/min (ref >60.00)
Glucose, Bld: 106 mg/dL — ABNORMAL HIGH (ref 70–99)
Potassium: 4.5 mEq/L (ref 3.5–5.1)
Sodium: 138 mEq/L (ref 135–145)
Total Bilirubin: 0.5 mg/dL (ref 0.2–1.2)
Total Protein: 6.7 g/dL (ref 6.0–8.3)

## 2021-07-20 LAB — VITAMIN B12: Vitamin B-12: 271 pg/mL (ref 211–911)

## 2021-07-20 LAB — HEMOGLOBIN A1C: Hgb A1c MFr Bld: 5.9 % (ref 4.6–6.5)

## 2021-07-20 LAB — VITAMIN D 25 HYDROXY (VIT D DEFICIENCY, FRACTURES): VITD: 38.83 ng/mL (ref 30.00–100.00)

## 2021-07-20 MED ORDER — TIZANIDINE HCL 2 MG PO CAPS
2.0000 mg | ORAL_CAPSULE | Freq: Three times a day (TID) | ORAL | 0 refills | Status: DC | PRN
Start: 2021-07-20 — End: 2021-07-26

## 2021-07-20 NOTE — Progress Notes (Signed)
Established Patient Office Visit     CC/Reason for Visit: Medicare wellness visit  HPI: Allen [name] is a 71 y.o. male who is coming in today for the above mentioned reasons. Past Medical History is significant for:    1.  Essential tremor with a brain stimulator followed by Dr. Carles Collet.   2.  COPD/asthma, followed by pulmonary   3.  Obstructive sleep apnea on BiPAP.   4.  Right carpal tunnel syndrome.   5.  Bilateral cataracts   6.  GERD causing vocal cord dysfunction.  He has been taking famotidine in the morning and omeprazole in the evening.   7.  He has had recurrent episodes of prostatitis followed by urology.  8.  He has been having a lot of depression.  He saw a psychiatrist who started him on Lexapro and Ambien.   8.  He has cervical radiculopathy and disc disease.    For the past couple nights he has been experiencing severe pain over his left neck and shoulder muscles, he believes related to sleeping position, cervical disc disease as well as strap for his BiPAP.  He is requesting a muscle relaxer.  He has routine eye and dental care.  All immunizations and cancer screenings are up-to-date.  Past Medical/Surgical History: Past Medical History:  Diagnosis Date   Anxiety    Arthritis    neck -   BPH with obstruction/lower urinary tract symptoms    urologist-- dr pace   Centrilobular emphysema [facility])    followed by dr Halford Chessman   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[facility])    followed by dr Halford Chessman   Cough 07/2020   covid residual, no congestion   DOE (dyspnea on exertion)    due to copd/ emphysema   ED (erectile dysfunction)    Essential tremor    neurologist--- dr tat----  bilateral upper extremities,  s/p DPS 12/ 2016   GERD (gastroesophageal reflux disease)    History of 2019 novel coronavirus disease (COVID-19) 07/18/2020   per pt postive home test 07-18-2020 w/ mild  symptoms per pt; pt stated had urgent care visit 07-24-2020 for sob/ cough with negative cxr (in care  everywhere) ;  follow up pcp visit 08-03-2020 in epic   Insomnia    Mixed simple and mucopurulent chronic bronchitis (HCC)    OSA (obstructive sleep apnea)    followed by dr Halford Chessman---- no longer does uses bipap, intolerant --- pt prescriped  Oxygen  2 L oxygen via [place] while sleeping  (study in epic 04/ 2017 [facility])   Perennial allergic rhinitis    Pulmonary nodules    followed by dr Halford Chessman   S/P deep brain stimulator placement 12/2014   followed by neurology-----IPG change 01-21-2020 for end-of life battery  (pt has a control)   Varicose vein of leg    per pt has had 3 procedure's last one 2006   Wears dentures    upper    Past Surgical History:  Procedure Laterality Date   ANKLE SURGERY Right 2020   tendon repair   CARPAL TUNNEL RELEASE Left 2019   CATARACT EXTRACTION W/ INTRAOCULAR LENS IMPLANT Bilateral 2020   COLONOSCOPY     DEEP BRAIN STIMULATOR PLACEMENT Left 2016   INGUINAL HERNIA REPAIR Right 2007   ROTATOR CUFF REPAIR Right 2014   SUBTHALAMIC STIMULATOR BATTERY REPLACEMENT N/A 01/21/2020   Procedure: Deep brain stimulator battery replacement;  Surgeon: Erline Levine, MD;  Location: [place];  Service: Neurosurgery;  Laterality:  N/A;   TONSILLECTOMY AND ADENOIDECTOMY     age 55   TOTAL SHOULDER REPLACEMENT Left 2016   TRANSURETHRAL RESECTION OF PROSTATE N/A 08/09/2020   Procedure: TRANSURETHRAL RESECTION OF THE PROSTATE (TURP), BLADDER BIOPSY AND FULGERATION;  Surgeon: Robley Fries, MD;  Location: [place];  Service: Urology;  Laterality: N/A;   VEIN SURGERY Left 2006   varicose veins    Social History:  reports that he quit smoking about 32 years ago. His smoking use included cigarettes. He has a 60.00 pack-year smoking history. He has never used smokeless tobacco. He reports that he does not currently use alcohol. He reports that he does not use drugs.  Allergies: Allergies  Allergen Reactions   Copper-Containing Compounds     Copper gloves caused  redness and blisters bilateral hands    Family History:  Family History  Problem Relation Age of Onset   COPD Mother    Asthma Father    Rectal cancer Sister    Healthy Child    Urticaria Neg Hx    Immunodeficiency Neg Hx    Eczema Neg Hx    Atopy Neg Hx    Angioedema Neg Hx    Allergic rhinitis Neg Hx      Current Outpatient Medications:    albuterol (PROVENTIL) (2.5 MG/3ML) 0.083% nebulizer solution, Take 3 mLs (2.5 mg total) by nebulization every 6 (six) hours., Disp: 150 mL, Rfl: 3   albuterol (VENTOLIN HFA) 108 (90 Base) MCG/ACT inhaler, INHALE 2 PUFFS INTO THE LUNGS EVERY 4 HOURS AS NEEDED FOR WHEEZE OR FOR SHORTNESS OF BREATH, Disp: 8.5 each, Rfl: 0   azelastine (ASTELIN) 0.1 % nasal spray, Place 1-2 sprays into both nostrils 2 (two) times daily as needed for rhinitis., Disp: 30 mL, Rfl: 5   buPROPion (WELLBUTRIN XL) 150 MG 24 hr tablet, Take 150 mg by mouth every morning., Disp: , Rfl:    clonazePAM (KLONOPIN) 0.5 MG tablet, Take 0.5 mg by mouth daily as needed., Disp: , Rfl:    erythromycin ophthalmic ointment, SMARTSIG:sparingly Right Eye Twice Daily, Disp: , Rfl:    escitalopram (LEXAPRO) 20 MG tablet, Take 1 tablet (20 mg total) by mouth daily., Disp: 90 tablet, Rfl: 1   fluticasone (FLONASE) 50 MCG/ACT nasal spray, Place 2 sprays into both nostrils daily as needed for allergies or rhinitis., Disp: 16 g, Rfl: 5   Fluticasone-Umeclidin-Vilant (TRELEGY ELLIPTA) 100-62.5-25 MCG/ACT AEPB, Inhale 1 puff into the lungs daily., Disp: 60 each, Rfl: 0   hydrOXYzine (VISTARIL) 25 MG capsule, Take 25 mg by mouth as needed., Disp: , Rfl:    mirabegron ER (MYRBETRIQ) 25 MG TB24 tablet, Take 25 mg by mouth daily., Disp: , Rfl:    montelukast (SINGULAIR) 10 MG tablet, Take 10 mg by mouth daily., Disp: , Rfl:    pantoprazole (PROTONIX) 40 MG tablet, Take 1 tablet (40 mg total) by mouth daily., Disp: 90 tablet, Rfl: 1   primidone (MYSOLINE) 50 MG tablet, 2 in the AM, 1 in the PM, Disp:  270 tablet, Rfl: 1   silodosin (RAPAFLO) 8 MG CAPS capsule, Take 1 capsule (8 mg total) by mouth daily., Disp: 30 capsule, Rfl: 1   tadalafil (CIALIS) 20 MG tablet, Take 1 tablet (20 mg total) by mouth daily as needed for erectile dysfunction. (Patient taking differently: Take 10 mg by mouth daily.), Disp: 90 tablet, Rfl: 3   tamsulosin (FLOMAX) 0.4 MG CAPS capsule, , Disp: , Rfl:    tizanidine (ZANAFLEX) 2 MG  capsule, Take 1 capsule (2 mg total) by mouth 3 (three) times daily as needed for muscle spasms., Disp: 90 capsule, Rfl: 0   traZODone (DESYREL) 100 MG tablet, TAKE 1 TABLET AT BEDTIME, Disp: 90 tablet, Rfl: 1   zaleplon (SONATA) 10 MG capsule, Take 10 mg by mouth at bedtime as needed., Disp: , Rfl:    zolpidem (AMBIEN) 10 MG tablet, Take 1 tablet (10 mg total) by mouth at bedtime as needed for sleep., Disp: 90 tablet, Rfl: 1  Review of Systems:  Constitutional: Denies fever, chills, diaphoresis, appetite change and fatigue.  HEENT: Denies photophobia, eye pain, redness, hearing loss, ear pain, congestion, sore throat, rhinorrhea, sneezing, mouth sores, trouble swallowing, neck pain, neck stiffness and tinnitus.   Respiratory: Denies SOB, DOE, cough, chest tightness,  and wheezing.   Cardiovascular: Denies chest pain, palpitations and leg swelling.  Gastrointestinal: Denies nausea, vomiting, abdominal pain, diarrhea, constipation, blood in stool and abdominal distention.  Genitourinary: Denies dysuria, urgency, frequency, hematuria, flank pain and difficulty urinating.  Endocrine: Denies: hot or cold intolerance, sweats, changes in hair or nails, polyuria, polydipsia. Musculoskeletal: Denies myalgias, back pain, joint swelling, arthralgias and gait problem.  Skin: Denies pallor, rash and wound.  Neurological: Denies dizziness, seizures, syncope, weakness, light-headedness, numbness and headaches.  Hematological: Denies adenopathy. Easy bruising, personal or family bleeding history   Psychiatric/Behavioral: Denies suicidal ideation, mood changes, confusion, nervousness, sleep disturbance and agitation    Physical Exam: Vitals:   07/20/21 0918  BP: 122/82  Pulse: 75  Temp: 98 F (36.7 C)  TempSrc: Oral  SpO2: 98%  Weight: 254 lb 1.6 oz (115.3 kg)  Height: 5' 9.75" (1.772 m)    Body mass index is 36.72 kg/m.   Constitutional: NAD, calm, comfortable Eyes: PERRL, lids and conjunctivae normal, wears corrective lenses ENMT: Mucous membranes are moist. Posterior pharynx clear of any exudate or lesions. Normal dentition. Tympanic membrane is pearly white, no erythema or bulging. Neck: normal, supple, no masses, no thyromegaly Respiratory: clear to auscultation bilaterally, no wheezing, no crackles. Normal respiratory effort. No accessory muscle use.  Cardiovascular: Regular rate and rhythm, no murmurs / rubs / gallops. No extremity edema. 2+ pedal pulses. No carotid bruits.  Abdomen: no tenderness, no masses palpated. No hepatosplenomegaly. Bowel sounds positive.  Musculoskeletal: no clubbing / cyanosis. No joint deformity upper and lower extremities. Good ROM, no contractures. Normal muscle tone.  Skin: no rashes, lesions, ulcers. No induration Neurologic: CN 2-12 grossly intact. Sensation intact, DTR normal. Strength 5/5 in all 4.  Psychiatric: Normal judgment and insight. Alert and oriented x 3. Normal mood.    Subsequent Medicare wellness visit   1. Risk factors, based on past  M,S,F -cardiovascular disease risk factors include age, gender.   2.  Physical activities: Walks on a daily basis   3.  Depression/mood: History of depression but mood is stable   4.  Hearing: No perceived issues   5.  ADL's: Independent in all ADLs   6.  Fall risk: Low fall risk   7.  Home safety: No problems identified   8.  Height weight, and visual acuity: height and weight as above, vision:  Vision Screening   Right eye Left eye Both eyes  Without correction 20/25  20/32   With correction        9.  Counseling: We have discussed healthy lifestyle modifications in great detail   10. Lab orders based on risk factors: Laboratory update will be reviewed   11. Referral :  None today   12. Care plan: Follow-up with me in 6 months   13. Cognitive assessment: No cognitive impairment   14. Screening: Patient provided with a written and personalized 5-10 year screening schedule in the AVS. yes   15. Provider List Update: PCP, pulmonologist, cardiologist, vascular surgeon, urologist, ophthalmologist  47. Advance Directives: Full code   17. Opioids: Patient is not on any opioid prescriptions and has no risk factors for a substance use disorder.   [place] Visit from 07/20/2021 in [place] at [place]  PHQ-9 Total Score 6          07/04/2020    2:27 PM 12/28/2020    1:33 PM 01/30/2021    2:11 PM 05/31/2021    8:27 AM 07/20/2021    9:17 AM  [place] in the past year? 0 1 0 1 1  Was there an injury with Fall? 0 0 0 0 1  Fall Risk Category Calculator 0 1 0 2 3  Fall Risk Category Low Low Low Moderate High  Patient Fall Risk Level Low fall risk Low fall risk Low fall risk Moderate fall risk Low fall risk  Patient at Risk for Falls Due to    Impaired balance/gait History of fall(s)  Fall risk Follow up     Falls evaluation completed     Impression and Plan:  Encounter for preventive health examination -Recommend routine eye and dental care. -Immunizations: Up-to-date -Healthy lifestyle discussed in detail. -Labs to be updated today. -Colon cancer screening: 09/2017 -Breast cancer screening: Not applicable -Cervical cancer screening: Not applicable -Lung cancer screening: Not applicable -Prostate cancer screening: Followed by urology -DEXA: not applicable  Essential tremor S/P deep brain stimulator placement -Followed by neurology  Degenerative disc disease, cervical  - Plan: tizanidine (ZANAFLEX) 2 MG  capsule  Moderate persistent asthma without complication/COPD -Followed by pulmonary  OSA treated with BiPAP -Noted  Vitamin D deficiency  - Plan: VITAMIN D 25 Hydroxy (Vit-D Deficiency, Fractures), VITAMIN D 25 Hydroxy (Vit-D Deficiency, Fractures)  Chronic diastolic CHF (congestive heart failure) ([place])  - Plan: CBC with Differential/Platelet, Comprehensive metabolic panel, Hemoglobin A1c, Lipid panel, TSH, Vitamin B12, Vitamin B12, TSH, Lipid panel, Hemoglobin A1c, Comprehensive metabolic panel, CBC with Differential/Platelet -Compensated, sees cardiology routinely  BPH with obstruction/lower urinary tract symptoms -Followed by urology.  Encounter for hepatitis C screening test for low risk patient  - Plan: Hep C Antibody      Lelon Frohlich, MD [facility]

## 2021-07-21 ENCOUNTER — Ambulatory Visit (HOSPITAL_COMMUNITY)
Admission: RE | Admit: 2021-07-21 | Discharge: 2021-07-21 | Disposition: A | Discharge status: Home / Self Care | Payer: Medicare HMO | Source: Ambulatory Visit | Attending: Vascular Surgery | Admitting: Vascular Surgery

## 2021-07-21 DIAGNOSIS — I8393 Asymptomatic varicose veins of bilateral lower extremities: Secondary | ICD-10-CM | POA: Insufficient documentation

## 2021-07-24 NOTE — Progress Notes (Unsigned)
[facility]   ASSESSMENT / PLAN: Allen [name] is a 71 y.o. male with chronic venous insufficiency of bilateral lower extremities causing varicosities (C2 disease).  He reports some numbness about the left fourth and fifth toes.  I do not this can be attributed to venous disease. Venous duplex is significant for prior evidence of great saphenous vein ablation.  Small saphenous and anterior accessory saphenous reflux. Recommend compression and elevation for symptomatic relief. Given his atypical symptoms, I counseled him against intervention on the small saphenous or anterior accessory saphenous veins.  Should he desire intervention, he knows he will need a trial of 3 months of compression before intervention can be offered.  Follow-up with me as needed.  CHIEF COMPLAINT: Varicosities, numbness  HISTORY OF PRESENT ILLNESS: Allen [name] is a 71 y.o. male who presents to clinic for evaluation of varicose veins throughout the lower extremities.  He has a long history of chronic venous insufficiency.  He first underwent what sounds like high ligation and stripping of the greater saphenous vein in his 31s.  Since then he is undergone multiple interventions for venous insufficiency.  He underwent what sounds like venous ablation of a accessory greater saphenous vein in 2008.  Since that time, he has developed numbness about his left fourth and fifth toes.  He is had worsening varicosities.  He has swelling, but this does not seem to be his main complaint.  He reports that his swelling gets worse with compression.  I counseled him that further intervention may be possible, but I would not expect any kind of durable relief or numbness from an intervention.  Past Medical History:  Diagnosis Date   Anxiety    Arthritis    neck -   BPH with obstruction/lower urinary tract symptoms    urologist-- dr pace   Centrilobular emphysema [facility])    followed by dr Halford Chessman   COPD with  asthma [facility])    followed by dr Halford Chessman   Cough 07/2020   covid residual, no congestion   DOE (dyspnea on exertion)    due to copd/ emphysema   ED (erectile dysfunction)    Essential tremor    neurologist--- dr tat----  bilateral upper extremities,  s/p DPS 12/ 2016   GERD (gastroesophageal reflux disease)    History of 2019 novel coronavirus disease (COVID-19) 07/18/2020   per pt postive home test 07-18-2020 w/ mild  symptoms per pt; pt stated had urgent care visit 07-24-2020 for sob/ cough with negative cxr (in care everywhere) ;  follow up pcp visit 08-03-2020 in epic   Insomnia    Mixed simple and mucopurulent chronic bronchitis ([place])    OSA (obstructive sleep apnea)    followed by dr Halford Chessman---- no longer does uses bipap, intolerant --- pt prescriped  Oxygen  2 L oxygen via [place] while sleeping  (study in epic 04/ 2017 [facility] 40)   Perennial allergic rhinitis    Pulmonary nodules    followed by dr Halford Chessman   S/P deep brain stimulator placement 12/2014   followed by neurology-----IPG change 01-21-2020 for end-of life battery  (pt has a control)   Varicose vein of leg    per pt has had 3 procedure's last one 2006   Wears dentures    upper    Past Surgical History:  Procedure Laterality Date   ANKLE SURGERY Right 2020   tendon repair   CARPAL TUNNEL RELEASE Left 2019   CATARACT EXTRACTION W/ INTRAOCULAR LENS IMPLANT  Bilateral 2020   COLONOSCOPY     DEEP BRAIN STIMULATOR PLACEMENT Left 2016   INGUINAL HERNIA REPAIR Right 2007   ROTATOR CUFF REPAIR Right 2014   SUBTHALAMIC STIMULATOR BATTERY REPLACEMENT N/A 01/21/2020   Procedure: Deep brain stimulator battery replacement;  Surgeon: Erline Levine, MD;  Location: [place];  Service: Neurosurgery;  Laterality: N/A;   TONSILLECTOMY AND ADENOIDECTOMY     age 37   TOTAL SHOULDER REPLACEMENT Left 2016   TRANSURETHRAL RESECTION OF PROSTATE N/A 08/09/2020   Procedure: TRANSURETHRAL RESECTION OF THE PROSTATE (TURP), BLADDER BIOPSY AND FULGERATION;   Surgeon: Robley Fries, MD;  Location: [place];  Service: Urology;  Laterality: N/A;   VEIN SURGERY Left 2006   varicose veins    Family History  Problem Relation Age of Onset   COPD Mother    Asthma Father    Rectal cancer Sister    Healthy Child    Urticaria Neg Hx    Immunodeficiency Neg Hx    Eczema Neg Hx    Atopy Neg Hx    Angioedema Neg Hx    Allergic rhinitis Neg Hx     Social History   Socioeconomic History   Marital status: Married    Spouse name: Not on file   Number of children: 3   Years of education: Not on file   Highest education level: Associate degree: occupational, Hotel manager, or vocational program  Occupational History   Not on file  Tobacco Use   Smoking status: Former    Packs/day: 3.00    Years: 20.00    Total pack years: 60.00    Types: Cigarettes    Quit date: 1991    Years since quitting: 32.5   Smokeless tobacco: Never  Vaping Use   Vaping Use: Never used  Substance and Sexual Activity   Alcohol use: Not Currently    Comment: occassionally   Drug use: Never   Sexual activity: Yes  Other Topics Concern   Not on file  Social History Narrative   Not on file   Social Determinants of Health   Financial Resource Strain: Low Risk  (03/20/2021)   Overall Financial Resource Strain (CARDIA)    Difficulty of Paying Living Expenses: Not hard at all  Food Insecurity: Not on file  Transportation Needs: No Transportation Needs (03/20/2021)   PRAPARE - Hydrologist (Medical): No    Lack of Transportation (Non-Medical): No  Physical Activity: Not on file  Stress: Not on file  Social Connections: Not on file  Intimate Partner Violence: Not on file    Allergies  Allergen Reactions   Copper-Containing Compounds     Copper gloves caused redness and blisters bilateral hands    Current Outpatient Medications  Medication Sig Dispense Refill   albuterol (PROVENTIL) (2.5 MG/3ML) 0.083% nebulizer  solution Take 3 mLs (2.5 mg total) by nebulization every 6 (six) hours. 150 mL 3   albuterol (VENTOLIN HFA) 108 (90 Base) MCG/ACT inhaler INHALE 2 PUFFS INTO THE LUNGS EVERY 4 HOURS AS NEEDED FOR WHEEZE OR FOR SHORTNESS OF BREATH 8.5 each 0   azelastine (ASTELIN) 0.1 % nasal spray Place 1-2 sprays into both nostrils 2 (two) times daily as needed for rhinitis. 30 mL 5   buPROPion (WELLBUTRIN XL) 150 MG 24 hr tablet Take 150 mg by mouth every morning.     clonazePAM (KLONOPIN) 0.5 MG tablet Take 0.5 mg by mouth daily as needed.     erythromycin  ophthalmic ointment SMARTSIG:sparingly Right Eye Twice Daily     escitalopram (LEXAPRO) 20 MG tablet Take 1 tablet (20 mg total) by mouth daily. 90 tablet 1   fluticasone (FLONASE) 50 MCG/ACT nasal spray Place 2 sprays into both nostrils daily as needed for allergies or rhinitis. 16 g 5   Fluticasone-Umeclidin-Vilant (TRELEGY ELLIPTA) 100-62.5-25 MCG/ACT AEPB Inhale 1 puff into the lungs daily. 60 each 0   hydrOXYzine (VISTARIL) 25 MG capsule Take 25 mg by mouth as needed.     mirabegron ER (MYRBETRIQ) 25 MG TB24 tablet Take 25 mg by mouth daily.     montelukast (SINGULAIR) 10 MG tablet Take 10 mg by mouth daily.     pantoprazole (PROTONIX) 40 MG tablet Take 1 tablet (40 mg total) by mouth daily. 90 tablet 1   primidone (MYSOLINE) 50 MG tablet 2 in the AM, 1 in the PM 270 tablet 1   silodosin (RAPAFLO) 8 MG CAPS capsule Take 1 capsule (8 mg total) by mouth daily. 30 capsule 1   tadalafil (CIALIS) 20 MG tablet Take 1 tablet (20 mg total) by mouth daily as needed for erectile dysfunction. (Patient taking differently: Take 10 mg by mouth daily.) 90 tablet 3   tamsulosin (FLOMAX) 0.4 MG CAPS capsule      tizanidine (ZANAFLEX) 2 MG capsule Take 1 capsule (2 mg total) by mouth 3 (three) times daily as needed for muscle spasms. 90 capsule 0   traZODone (DESYREL) 100 MG tablet TAKE 1 TABLET AT BEDTIME 90 tablet 1   zaleplon (SONATA) 10 MG capsule Take 10 mg by mouth  at bedtime as needed.     zolpidem (AMBIEN) 10 MG tablet Take 1 tablet (10 mg total) by mouth at bedtime as needed for sleep. 90 tablet 1   No current facility-administered medications for this visit.    PHYSICAL EXAM Vitals:   07/25/21 0902  BP: 121/78  Pulse: 80  Resp: 20  Temp: 98.6 F (37 C)  SpO2: 97%  Weight: 252 lb 3.2 oz (114.4 kg)  Height: 5' 9.75" (1.772 m)    Well-appearing elderly man in no acute distress Regular rate and rhythm Unlabored breathing Prominent varicosities about the lower extremities bilaterally.  These are not in a typical distribution of the superficial vein.  They are cross every surface of the exposed lower extremities.  He has 2+ pedal pulses bilaterally.    PERTINENT LABORATORY AND RADIOLOGIC DATA  Most recent CBC    Latest Ref Rng & Units 07/20/2021   10:10 AM 06/15/2020    2:04 PM 03/08/2020   12:03 PM  CBC  WBC 4.0 - 10.5 K/uL 7.2  8.1  6.6   Hemoglobin 13.0 - 17.0 g/dL 12.9  13.3  11.9   Hematocrit 39.0 - 52.0 % 39.2  40.1  35.5   Platelets 150.0 - 400.0 K/uL 284.0  295.0  296.0      Most recent CMP    Latest Ref Rng & Units 07/20/2021   10:10 AM 06/15/2020    2:04 PM 03/08/2020   12:03 PM  CMP  Glucose 70 - 99 mg/dL 106  88  108   BUN 6 - 23 mg/dL '27  23  21   '$ Creatinine 0.40 - 1.50 mg/dL 1.17  1.13  1.01   Sodium 135 - 145 mEq/L 138  143  138   Potassium 3.5 - 5.1 mEq/L 4.5  4.5  4.5   Chloride 96 - 112 mEq/L 103  105  105  CO2 19 - 32 mEq/L '27  19  25   '$ Calcium 8.4 - 10.5 mg/dL 9.5  9.5  9.3   Total Protein 6.0 - 8.3 g/dL 6.7  6.8  6.7   Total Bilirubin 0.2 - 1.2 mg/dL 0.5  0.3  0.3   Alkaline Phos 39 - 117 U/L 46  61  52   AST 0 - 37 U/L '21  17  21   '$ ALT 0 - 53 U/L '20  13  24     '$ Renal function Estimated Creatinine Clearance: 73.1 mL/min (by C-G formula based on SCr of 1.17 mg/dL).  Hgb A1c MFr Bld (%)  Date Value  07/20/2021 5.9    LDL Cholesterol  Date Value Ref Range Status  07/20/2021 96 0 - 99 mg/dL Final      Vascular Imaging: Left lower extremity venous duplex for reflux Left:  - No evidence of deep vein thrombosis seen in the left lower extremity,  from the common femoral through the popliteal veins.  - Venous reflux is noted in the left femoral vein.  - Venous reflux is noted in the left short saphenous vein.  - Venous reflux in the AASV proximal to mid segment, origin not visualized  - The Greater Saphenous vein in its entirety appears to have been  stripped.      Yevonne Aline. Stanford Breed, MD [facility] Phone Number: [phone] 07/25/2021 2:51 PM  Total time spent on preparing this encounter including chart review, data review, collecting history, examining the patient, coordinating care for this new patient, 60 minutes.  Portions of this report may have been transcribed using voice recognition software.  Every effort has been made to ensure accuracy; however, inadvertent computerized transcription errors may still be present.

## 2021-07-25 ENCOUNTER — Ambulatory Visit: Payer: Medicare HMO | Admitting: Vascular Surgery

## 2021-07-25 ENCOUNTER — Encounter: Payer: Self-pay | Admitting: Vascular Surgery

## 2021-07-25 VITALS — BP 121/78 | HR 80 | Temp 98.6°F | Resp 20 | Ht 69.75 in | Wt 252.2 lb

## 2021-07-25 DIAGNOSIS — I872 Venous insufficiency (chronic) (peripheral): Secondary | ICD-10-CM

## 2021-07-26 ENCOUNTER — Other Ambulatory Visit: Payer: Self-pay | Admitting: *Deleted

## 2021-07-26 DIAGNOSIS — M47812 Spondylosis without myelopathy or radiculopathy, cervical region: Secondary | ICD-10-CM | POA: Diagnosis not present

## 2021-07-26 MED ORDER — TIZANIDINE HCL 2 MG PO TABS: 2.0000 mg | ORAL_TABLET | Freq: Three times a day (TID) | ORAL | 0 refills | Status: DC

## 2021-07-27 ENCOUNTER — Ambulatory Visit (INDEPENDENT_AMBULATORY_CARE_PROVIDER_SITE_OTHER): Payer: Medicare HMO | Admitting: Dermatology

## 2021-07-27 DIAGNOSIS — D0439 Carcinoma in situ of skin of other parts of face: Secondary | ICD-10-CM | POA: Diagnosis not present

## 2021-07-27 DIAGNOSIS — D692 Other nonthrombocytopenic purpura: Secondary | ICD-10-CM

## 2021-07-27 DIAGNOSIS — D099 Carcinoma in situ, unspecified: Secondary | ICD-10-CM

## 2021-07-27 NOTE — Patient Instructions (Addendum)
Pick up DerMend lotion over the counter for arms   Biopsy, Surgery (Curettage) & Surgery (Excision) Aftercare Instructions  1. Okay to remove bandage in 24 hours  2. Wash area with soap and water  3. Apply Vaseline to area twice daily until healed (Not Neosporin)  4. Okay to cover with a Band-Aid to decrease the chance of infection or prevent irritation from clothing; also it's okay to uncover lesion at home.  5. Suture instructions: return to our office in 7-10 or 10-14 days for a nurse visit for suture removal. Variable healing with sutures, if pain or itching occurs call our office. It's okay to shower or bathe 24 hours after sutures are given.  6. The following risks may occur after a biopsy, curettage or excision: bleeding, scarring, discoloration, recurrence, infection (redness, yellow drainage, pain or swelling).  7. For questions, concerns and results call our office at [place] before 4pm & Friday before 3pm. Biopsy results will be available in 1 week.

## 2021-07-31 ENCOUNTER — Other Ambulatory Visit: Payer: Self-pay | Admitting: Neurology

## 2021-07-31 ENCOUNTER — Encounter: Payer: Self-pay | Admitting: Vascular Surgery

## 2021-07-31 ENCOUNTER — Telehealth: Payer: Self-pay | Admitting: Neurology

## 2021-07-31 ENCOUNTER — Ambulatory Visit: Payer: Medicare HMO | Admitting: Neurology

## 2021-07-31 ENCOUNTER — Encounter: Payer: Self-pay | Admitting: Neurology

## 2021-07-31 DIAGNOSIS — G250 Essential tremor: Secondary | ICD-10-CM

## 2021-07-31 MED ORDER — TOPIRAMATE 100 MG PO TABS: 100.0000 mg | ORAL_TABLET | Freq: Two times a day (BID) | ORAL | 1 refills | Status: DC

## 2021-07-31 MED ORDER — TOPIRAMATE 25 MG PO TABS: ORAL_TABLET | ORAL | 0 refills | Status: DC

## 2021-07-31 NOTE — Patient Instructions (Signed)
Start topamax 25 mg, 1 tablet daily for 1 week, then 2 tablets daily for 1 week and then 3 tablets daily for 1 week and then you will start topamax 100 mg daily

## 2021-07-31 NOTE — Procedures (Signed)
DBS Programming was performed.  Patient's device interrogated.  DBS confirmed to be on.  Battery not near end-of-life.  Impedances were normal.  Total time spent programming the device was 25 minutes.  Final settings were as follows.  Manufacturer of DBS device: [employer]onic      Active Contact Amplitude (V) PW (ms) Frequency (hz) Side Effects Battery  Left Brain        Group A        11/26/19 2-C+ 2.1 60 150    01/04/20 2-C+ 2.1 60 150                            Group B (active)        11/26/19 2-0+ 2.9 90 170  2.63  01/04/20 2-0+ 2.9 90 170  2.56  07/04/20 2-0+ 3.1 90 170  2.96  01/30/21 2-0+ 3.2 90 170  2.95  07/31/21 2-0+ 3.6 90 180  2.92                          Right Brain        n/a

## 2021-07-31 NOTE — Telephone Encounter (Signed)
Patient seen in the clinic today.  He reported that [place] refused to send him his primidone because of interactions with his other medications.  Please call [facility] and tell them that we are not understanding why they are holding his medications.  Perhaps we do not understand the interaction, but also do not understand why they have not called [place] about holding the medication we are prescribing

## 2021-08-01 ENCOUNTER — Other Ambulatory Visit: Payer: Self-pay | Admitting: Internal Medicine

## 2021-08-01 NOTE — Telephone Encounter (Signed)
There appears to have been a missed communication on the Primidone. I called patient and left voicemail he can discontinue Topamax and that [place] is going to deliver the Primidone

## 2021-08-07 DIAGNOSIS — M47812 Spondylosis without myelopathy or radiculopathy, cervical region: Secondary | ICD-10-CM | POA: Diagnosis not present

## 2021-08-07 DIAGNOSIS — Z6835 Body mass index (BMI) 35.0-35.9, adult: Secondary | ICD-10-CM | POA: Diagnosis not present

## 2021-08-08 ENCOUNTER — Encounter: Payer: Self-pay | Admitting: Pulmonary Disease

## 2021-08-08 DIAGNOSIS — G4733 Obstructive sleep apnea (adult) (pediatric): Secondary | ICD-10-CM

## 2021-08-08 NOTE — Telephone Encounter (Signed)
Received the following message from patient:   "I finally have a mask that fits and have been using every day. I notice that my events are going up as a pattern and wondering if I should be concerned about this."  Joellen Jersey, do you mind taking a look at his bipap download? He was seen by you back in April of this year and Dr. Halford Chessman is still currently out of the office.

## 2021-08-09 DIAGNOSIS — R3916 Straining to void: Secondary | ICD-10-CM | POA: Diagnosis not present

## 2021-08-09 DIAGNOSIS — N401 Enlarged prostate with lower urinary tract symptoms: Secondary | ICD-10-CM | POA: Diagnosis not present

## 2021-08-09 DIAGNOSIS — R3913 Splitting of urinary stream: Secondary | ICD-10-CM | POA: Diagnosis not present

## 2021-08-09 DIAGNOSIS — R351 Nocturia: Secondary | ICD-10-CM | POA: Diagnosis not present

## 2021-08-09 DIAGNOSIS — R3915 Urgency of urination: Secondary | ICD-10-CM | POA: Diagnosis not present

## 2021-08-09 NOTE — Telephone Encounter (Signed)
Download reviewed. Leaks look better. He does have some breakthrough events but these are less than 5 an hour. He is using the max inspiratory pressure most times. Let's try adjusting his pressure settings to see if this corrects the breakthrough events. Please send order to DME to adjust max IPAP to 15 and min EPAP to 8; PS 4. Thanks! Please ensure he has follow up scheduled to review.

## 2021-08-09 NOTE — Telephone Encounter (Signed)
Download has been given to [facility] to review.

## 2021-08-10 ENCOUNTER — Other Ambulatory Visit: Payer: Self-pay | Admitting: Urology

## 2021-08-14 ENCOUNTER — Telehealth: Payer: Self-pay | Admitting: Pharmacist

## 2021-08-14 NOTE — Chronic Care Management (AMB) (Signed)
    Chronic Care Management Pharmacy Assistant   Name: [name]  MRN: [id number] DOB: 09/10/1950  08/15/2021 APPOINTMENT REMINDER  Allen [name] was reminded to have all medications, supplements and any blood glucose and blood pressure readings available for review with Jeni Salles, Pharm. D, at his telephone visit on 08/15/2021 at 12:00.  Care Gaps: AWV - previous message sent to [name] Last BP - 104/67 on 07/31/2021 Last A1C - 5.9 on 07/20/2021 Hep C Screen - never done Covid booster - overdue Pneumonia - postponed  Star Rating Drug: None  Any gaps in medications fill history? No  [name] [facility]  Catering manager [phone]

## 2021-08-15 ENCOUNTER — Ambulatory Visit (INDEPENDENT_AMBULATORY_CARE_PROVIDER_SITE_OTHER): Payer: Medicare HMO | Admitting: Pharmacist

## 2021-08-15 DIAGNOSIS — F339 Major depressive disorder, recurrent, unspecified: Secondary | ICD-10-CM

## 2021-08-15 DIAGNOSIS — N138 Other obstructive and reflux uropathy: Secondary | ICD-10-CM

## 2021-08-15 NOTE — Progress Notes (Signed)
Chronic Care Management Pharmacy Note  08/24/2021 Name:  Allen [name] MRN:  [id number] DOB:  February 16, 1950  Summary: Pt reports side effects with primidone  Recommendations/Changes made from today's visit: -Recommended discussing with neurology about switching to primidone to alternative medication  -Recommended Pepcid or Tums as needed for days that are worse  Plan: COPD assessment in 2 months Follow up in 4-5 months  Subjective: Allen [name] is an 71 y.o. year old male who is a primary patient of Isaac Bliss, Rayford Halsted, MD.  The CCM team was consulted for assistance with disease management and care coordination needs.    Engaged with patient by telephone for follow up visit  in response to provider referral for pharmacy case management and/or care coordination services.   Consent to Services:  The patient was given information about Chronic Care Management services, agreed to services, and gave verbal consent prior to initiation of services.  Please see initial visit note for detailed documentation.   Patient Care Team: Isaac Bliss, Rayford Halsted, MD as PCP - General (Internal Medicine) Buford Dresser, MD as PCP - Cardiology (Cardiology) Tat, Eustace Quail, DO as Consulting Physician (Neurology) Viona Gilmore, [facility] as Pharmacist (Pharmacist) Lavonna Monarch, MD as Consulting Physician (Dermatology)  Recent office visits: 07/20/21 Lelon Frohlich, MD : Patient presented for annual exam. No medication changes.  05/31/21 Estela Isaac Bliss, MD : Patient presented for hernia and weight loss. Referred to general surgery.  Recent consult visits: 08/07/21 Elwin Sleight, DO (neurosurgery): Patient presented for facet arthropathy. Unable to access notes.  07/31/21 Alonza Bogus, DO (neurology): Patient presented for tremor follow up. Prescribed topamax 25 mg daily x 1 week, then 50 mg/day x 1 week, 75 mg/day x 1 week, then 100 mg daily.  07/27/21 Lavonna Monarch, MD  (dermatology): Patient presented for solar purpura and destruction of squamous cell lesion. Recommended OTC Dermend.  07/25/21 Jamelle Haring, MD (vein and vascular): Patient presented for initial visit for chronic venous insufficiency.  07/10/21 Patient presented for sleep study.  06/09/21 Gardiner Barefoot, DPM (podiatry): Patient presented for nail debridement. No medication changes. Follow up in 3 months.   05/29/21 Buford Dresser, MD (cardiology): Patient presented for varicose veins follow up. Referred to vascular surgery. Follow up PRN.  05/15/2021 Troy Dawley DO (neurosurgery) - Patient was seen for Cervical spondylosis with myelopathy and additional issues. No additional chart notes.     05/08/2021 Sharlynn Oliphant PT (OP Rehab) - Patient was seen for unsteadiness on feet and muscle weakness. No medication changes. Follow up twice weekly for 4 weeks.    05/08/21 [name] Kitchen NP (pulmonary) - Patient was seen for OSA (obstructive sleep apnea) and additional issues. Changed Trelegy Ellipta to 100-62.5-25 mcg/act 1 puff daily. Follow up with Dr. Halford Chessman or Katie Cobb,NP in 8-12 weeks. If symptoms do not improve or worsen, please contact office for sooner follow up or seek emergency care.   05/03/21 Lavonna Monarch MD (dermatology) - Patient was seen for Encounter for screening for malignant neoplasm of skin and additional issues. No medication changes. Follow up in 1 year.   04/25/21 Hulan Saas DO (sports med) - Patient was seen for Nonallopathic lesion of lumbar region and additional issues. Referral to physical therapy and neurosurgery. No medication changes. Follow up in 6-8 weeks after injection.   03/29/21 Arnette Schaumann (urology): Patient presented for follow up. Unable to access notes.  03/27/21 Wallis Mart (ophthalmology): Patient presented for eye exam. Unable to access notes.  03/15/21 Hulan Saas  D) (sport medicine) - Patient was seen for degenerative disc disease, cervical.  Started prednisone 40 mg daily for 5 days. Follow up in 6 weeks.   Hospital visits: 03/15/21 Patient presented to [facility] on [facility] for earache, sore throat and cough. Prescribed Augmentin x 7 days.   Objective:  Lab Results  Component Value Date   CREATININE 1.21 08/22/2021   BUN 15 08/22/2021   GFR 62.94 07/20/2021   GFRNONAA >60 08/22/2021   NA 139 08/22/2021   K 4.8 08/22/2021   CALCIUM 9.6 08/22/2021   CO2 26 08/22/2021   GLUCOSE 110 (H) 08/22/2021    Lab Results  Component Value Date/Time   HGBA1C 5.9 07/20/2021 10:10 AM   HGBA1C 5.9 06/15/2020 02:04 PM   GFR 62.94 07/20/2021 10:10 AM   GFR 66.13 06/15/2020 02:04 PM    Last diabetic Eye exam: No results found for: "HMDIABEYEEXA"  Last diabetic Foot exam: No results found for: "HMDIABFOOTEX"   Lab Results  Component Value Date   CHOL 194 07/20/2021   HDL 67.20 07/20/2021   LDLCALC 96 07/20/2021   TRIG 154.0 (H) 07/20/2021   CHOLHDL 3 07/20/2021       Latest Ref Rng & Units 07/20/2021   10:10 AM 06/15/2020    2:04 PM 03/08/2020   12:03 PM  Hepatic Function  Total Protein 6.0 - 8.3 g/dL 6.7  6.8  6.7   Albumin 3.5 - 5.2 g/dL 4.5  4.5  4.3   AST 0 - 37 U/L _0 ALT 0 - 53 U/L _1 Alk Phosphatase 39 - 117 U/L 46  61  52   Total Bilirubin 0.2 - 1.2 mg/dL 0.5  0.3  0.3     Lab Results  Component Value Date/Time   TSH 1.95 07/20/2021 10:10 AM   TSH 1.69 06/15/2020 02:04 PM       Latest Ref Rng & Units 08/22/2021    9:06 AM 07/20/2021   10:10 AM 06/15/2020    2:04 PM  CBC  WBC 4.0 - 10.5 K/uL 6.7  7.2  8.1   Hemoglobin 13.0 - 17.0 g/dL 13.1  12.9  13.3   Hematocrit 39.0 - 52.0 % 40.8  39.2  40.1   Platelets 150 - 400 K/uL 277  284.0  295.0     Lab Results  Component Value Date/Time   VD25OH 38.83 07/20/2021 10:10 AM   VD25OH 33.85 06/15/2020 02:04 PM    Clinical ASCVD: No  The 10-year ASCVD risk score (Arnett DK, et al., 2019) is: 15.6%    Values used to calculate the score:     Age: 71 years     Sex: Male     Is Non-Hispanic African American: No     Diabetic: No     Tobacco smoker: No     Systolic Blood Pressure: 703 mmHg     Is BP treated: No     HDL Cholesterol: 67.2 mg/dL     Total Cholesterol: 194 mg/dL       07/20/2021   10:10 AM 05/31/2021    8:27 AM 12/28/2020    1:32 PM  Depression screen PHQ 2/9  Decreased Interest 1 1 0  Down, Depressed, Hopeless 2 0 0  PHQ - 2 Score 3 1 0  Altered sleeping 1 2 0  Tired, decreased energy _2 Change in appetite 0 0 0  Feeling  bad or failure about yourself  0 0 0  Trouble concentrating 0 0 0  Moving slowly or fidgety/restless 0 0 3  Suicidal thoughts 0 0 0  PHQ-9 Score _0 Difficult doing work/chores Not difficult at all Not difficult at all Not difficult at all    [facility] Dyspnea Scale mMRC Score  04/27/2019  1:38 PM 3  04/10/2019  9:02 AM 0    Social History   Tobacco Use  Smoking Status Former   Packs/day: 3.00   Years: 20.00   Total pack years: 60.00   Types: Cigarettes   Quit date: 1991   Years since quitting: 32.6  Smokeless Tobacco Never   BP Readings from Last 3 Encounters:  08/22/21 122/85  07/31/21 104/67  07/25/21 121/78   Pulse Readings from Last 3 Encounters:  08/22/21 72  07/31/21 88  07/25/21 80   Wt Readings from Last 3 Encounters:  08/22/21 238 lb 9.6 oz (108.2 kg)  07/31/21 252 lb (114.3 kg)  07/25/21 252 lb 3.2 oz (114.4 kg)   BMI Readings from Last 3 Encounters:  08/22/21 34.24 kg/m  07/31/21 36.42 kg/m  07/25/21 36.45 kg/m    Assessment/Interventions: Review of patient past medical history, allergies, medications, health status, including review of consultants reports, laboratory and other test data, was performed as part of comprehensive evaluation and provision of chronic care management services.   SDOH:  (Social Determinants of Health) assessments and interventions performed: Yes (last 03/20/21)   SDOH  Screenings   Alcohol Screen: Not on file  Depression (PHQ2-9): Medium Risk (07/20/2021)   Depression (PHQ2-9)    PHQ-2 Score: 6  Financial Resource Strain: Low Risk  (03/20/2021)   Overall Financial Resource Strain (CARDIA)    Difficulty of Paying Living Expenses: Not hard at all  Food Insecurity: Not on file  Housing: Not on file  Physical Activity: Not on file  Social Connections: Not on file  Stress: Not on file  Tobacco Use: Medium Risk (08/22/2021)   Patient History    Smoking Tobacco Use: Former    Smokeless Tobacco Use: Never    Passive Exposure: Not on file  Transportation Needs: No Transportation Needs (03/20/2021)   PRAPARE - Hydrologist (Medical): No    Lack of Transportation (Non-Medical): No    CCM Care Plan  Allergies  Allergen Reactions   Copper-Containing Compounds     Copper gloves caused redness and blisters bilateral hands    Medications Reviewed Today     Reviewed by Willeen Cass, RN (Registered Nurse) on 08/22/21 at [place] List Status: <None>   Medication Order Taking? Sig Documenting Provider Last Dose Status Informant  albuterol (PROVENTIL) (2.5 MG/3ML) 0.083% nebulizer solution [id number]  Take 3 mLs (2.5 mg total) by nebulization every 6 (six) hours. Chesley Mires, MD  Active Self           Med Note Liliane Bade Aug 17, 2021  1:34 PM) On hand  albuterol (VENTOLIN HFA) 108 (90 Base) MCG/ACT inhaler [id number]  INHALE 2 PUFFS INTO THE LUNGS EVERY 4 HOURS AS NEEDED FOR WHEEZE OR FOR SHORTNESS OF BREATH Valentina Shaggy, MD  Active Self           Med Note Kenton Kingfisher, Nani Skillern Aug 17, 2021  1:33 PM) On hand  Ascorbic Acid (VITAMIN C) 1000 MG tablet [id number]  Take 1,000 mg by mouth in the morning. [provider]  Active Self           Med Note Liliane Bade Aug 17, 2021  1:38 PM) On hold due to upcoming procedure.   azelastine (ASTELIN) 0.1 % nasal spray [id number]  Place 1-2 sprays  into both nostrils 2 (two) times daily as needed for rhinitis. Valentina Shaggy, MD  Active Self           Med Note Kenton Kingfisher, Nani Skillern Aug 17, 2021  1:33 PM) During March & April   buPROPion (WELLBUTRIN XL) 150 MG 24 hr tablet [id number]  Take 150 mg by mouth every morning. [provider]  Active Self  Cholecalciferol (VITAMIN D3) 125 MCG (5000 UT) TABS [id number]  Take 5,000 Units by mouth in the morning. [provider]  Active Self           Med Note Liliane Bade Aug 17, 2021  1:36 PM) On hold due to upcoming procedure.   Coenzyme Q10 300 MG CAPS [id number]  Take 300 mg by mouth in the morning. [provider]  Active Self           Med Note Liliane Bade Aug 17, 2021  1:36 PM) On hold due to upcoming procedure.  escitalopram (LEXAPRO) 20 MG tablet [id number]  Take 1 tablet (20 mg total) by mouth daily.  Patient taking differently: Take 20 mg by mouth at bedtime.   Isaac Bliss, Rayford Halsted, MD  Active Self  fluticasone [facility]) 50 MCG/ACT nasal spray [id number]  Place 2 sprays into both nostrils daily as needed for allergies or rhinitis. Valentina Shaggy, MD  Active Self           Med Note Kenton Kingfisher, Nani Skillern Aug 17, 2021  1:33 PM) During March & April    MYRBETRIQ 50 MG TB24 tablet [id number]  Take 50 mg by mouth in the morning. [provider]  Active Self  Omega-3 Fatty Acids (FISH OIL PO) [id number]  Take 2,200 mg by mouth in the morning. [provider]  Active Self           Med Note Liliane Bade Aug 17, 2021  1:37 PM) On hold due to upcoming procedure.   pantoprazole (PROTONIX) 40 MG tablet [id number]  TAKE 1 TABLET EVERY DAY  Patient not taking: Reported on 08/17/2021   Isaac Bliss, Rayford Halsted, MD  Active Self  primidone (MYSOLINE) 50 MG tablet [id number]  2 in the AM, 1 in the PM  Patient taking differently: Take 50 mg by mouth in the morning.   Ludwig Clarks, DO  Active Self   tadalafil (CIALIS) 20 MG tablet [id number]  Take 1 tablet (20 mg total) by mouth daily as needed for erectile dysfunction.  Patient taking differently: Take 10 mg by mouth daily.   Isaac Bliss, Rayford Halsted, MD  Active Self  tamsulosin [facility]) 0.4 MG CAPS capsule [id number]  Take 0.4 mg by mouth in the morning. [provider]  Active Self  tiZANidine (ZANAFLEX) 2 MG tablet [id number]  Take 1 tablet (2 mg total) by mouth 3 (three) times daily.  Patient not taking: Reported on 08/17/2021   Isaac Bliss, Rayford Halsted, MD  Active Self  topiramate (TOPAMAX) 100 MG tablet [id number]  Take 1 tablet (100 mg total) by mouth 2 (two) times daily.  Patient not taking: Reported on 08/15/2021   Tat, Eustace Quail,  DO  Active Self  topiramate (TOPAMAX) 25 MG tablet [id number]  1 po q day x 1 week, then 2 po qd, then 3 po q day  Patient not taking: Reported on 08/15/2021   Tat, Eustace Quail, DO  Active Self  traZODone (DESYREL) 100 MG tablet [id number]  TAKE 1 TABLET AT BEDTIME Isaac Bliss, Rayford Halsted, MD  Active Self  TRELEGY ELLIPTA 200-62.5-25 MCG/ACT AEPB [id number]  Inhale 1 puff into the lungs in the morning. [provider]  Active Self  TURMERIC PO [id number]  Take 2,250 mg by mouth in the morning. [provider]  Active Self           Med Note Liliane Bade Aug 17, 2021  1:38 PM) On hold due to upcoming procedure.   zaleplon (SONATA) 10 MG capsule [id number]  Take 10 mg by mouth at bedtime. [provider]  Active Self            Patient Active Problem List   Diagnosis Date Noted   Chronic respiratory failure with hypoxia ([place]) 09/01/2020   BPH with obstruction/lower urinary tract symptoms 08/09/2020   S/P deep brain stimulator placement 03/25/2020   Tremor 03/25/2020   Pulmonary nodule less than 6 mm in diameter with high risk for malignant neoplasm 03/09/2020   Injury of axilla 03/03/2020   Body mass index (BMI) 32.0-32.9, adult 02/22/2020   Cervical  radiculopathy 02/22/2020   Elevated blood-pressure reading, without diagnosis of hypertension 02/22/2020   Neck pain 02/22/2020   Greater trochanteric bursitis of right hip 11/18/2019   Degenerative disc disease, cervical 07/01/2019   Degenerative disc disease, lumbar 07/01/2019   Nonallopathic lesion of lumbar region 07/01/2019   Varicose veins of both lower extremities 06/23/2019   Chronic diastolic CHF (congestive heart failure) ([place]) 06/02/2019   Perennial allergic rhinitis probable nonallergic component 06/01/2019   Cough, persistent 06/01/2019   Vitamin D deficiency 05/07/2019   Moderate persistent asthma without complication 85/88/5027   Allergic rhinitis 04/10/2019   Shortness of breath 04/10/2019   GERD (gastroesophageal reflux disease)    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([place])    OSA treated with BiPAP    Right carpal tunnel syndrome    Essential tremor    Insomnia    ED (erectile dysfunction)    Ulcerative colitis ([place]) 06/30/2014    Immunization History  Administered Date(s) Administered   Fluad Quad(high Dose 65+) 09/18/2018, 11/10/2019   Influenza, High Dose Seasonal PF 11/01/2020   PFIZER(Purple Top)SARS-COV-2 Vaccination 02/20/2019, 03/17/2019, 06/02/2019, 10/03/2019, 11/01/2020   Pneumococcal Polysaccharide-23 01/08/2015   Pneumococcal-Unspecified 12/15/2017   Tdap 09/17/2016   Zoster Recombinat (Shingrix) 06/06/2019, 09/05/2019   -did he stop montelukast or no? He is not taking this anymore. He only has March and April that bother him.   Patient reports his biggest concern with his medications right now are with primidone and the side effects he is having with it. It is mostly dizziness and nausea and he is going to contact neurology about coming off of it. He also wants to try topiramate which was prescribed at his last visit but he never tried because his neurologist wanted him to stay on primidone.  Patient is also still having daytime fatigue which could be coming from  his sleeping medications as he is taking both trazodone and Sonata every night. He is also using the BiPAP regularly.  Patient was still waking up on the Ambien every few hours and that is why that was stopped. Patient  is not taking clonazepam for anxiety. Patient is still taking the hydroxyzine for anxiety. Patient reports the wellbutrin has been helping mood wise. He has a visit with pshyciatrist every 3 months to see how medications are doing. Patient is trying to get connected with therapist, he has tried one a long time ago and she wasn't right with him.   Patient has 3.5 hour surgery discussion with surgery in a few days will discuss. He is living with the pain, it's constant. He is using ibuprofen - but cannot use that right now.   Patient does get 25% off medications so that is better than nothing. He is unsure how he got approved with this but it really helps. Patient does not qualify for patient assistance based on current household income.  Patient inquired about meloxicam for pain but will discuss after his surgery.  Conditions to be addressed/monitored:  GERD, COPD, Depression, BPH, Allergic Rhinitis, and Tremor, Insomnia  Conditions addressed this visit: Depression, BPH  Care Plan : CCM Pharmacy Care Plan  Updates made by Viona Gilmore, [place] since 08/24/2021 12:00 AM     Problem: Problem: GERD, COPD, Depression, Allergic Rhinitis, and Tremor, Pain, Insomnia      Long-Range Goal: Patient-Specific Goal   Start Date: 03/20/2021  Expected End Date: 03/21/2022  Recent Progress: On track  Priority: High  Note:   Current Barriers:  Unable to independently monitor therapeutic efficacy  Pharmacist Clinical Goal(s):  Patient will achieve adherence to monitoring guidelines and medication adherence to achieve therapeutic efficacy through collaboration with PharmD and provider.   Interventions: 1:1 collaboration with Isaac Bliss, Rayford Halsted, MD regarding development and update of  comprehensive plan of care as evidenced by provider attestation and co-signature Inter-disciplinary care team collaboration (see longitudinal plan of care) Comprehensive medication review performed; medication list updated in electronic medical record  COPD (Goal: control symptoms and prevent exacerbations) -Controlled -Current treatment  Trelegy 200-62.5-25 mcg/act 1 puff daily - Appropriate, Effective, Safe, Query accessible Albuterol HFA as needed - Appropriate, Effective, Safe, Accessible Albuterol nebulizer as needed - Appropriate, Effective, Safe, Accessible -Medications previously tried: Breo  -Gold Grade: Gold 2 (FEV1 50-79%) -Current COPD Classification:  B (high sx, <2 exacerbations/yr) -MMRC/CAT score: 3 -Pulmonary function testing: 2018 -Exacerbations requiring treatment in last 6 months: none -Patient reports consistent use of maintenance inhaler -Frequency of rescue inhaler use: 1-2 times a day during allergy season -Counseled on Benefits of consistent maintenance inhaler use When to use rescue inhaler -Counseled on diet and exercise extensively Recommended to continue current medication  Allergic rhinitis (Goal: minimize symptoms) -Controlled -Current treatment  Hydroxyzine 25 mg 1 tablet twice daily - Appropriate, Query effective, Safe, Accessible Azelastine 0.1% nasal spray 1-2 sprays in each nostril as needed - Appropriate, Effective, Safe, Accessible Flonase 50 mcg/act  2 sprays in both nostrils as needed  - Appropriate, Effective, Safe, Accessible -Medications previously tried: montelukast (ineffective) - Recommended Zyrtec or Claritin daily during allergy season.  Depression (Goal: minimize symptoms) -Controlled -Current treatment: Escitalopram 20 mg 1 tablet daily - Appropriate, Effective, Safe, Accessible Bupropion XL 150 mg 1 tablet daily - Appropriate, Effective, Safe, Accessible  -Medications previously tried/failed: unknown -PHQ9: 5 -GAD7:  n/a -Educated on Benefits of medication for symptom control Benefits of cognitive-behavioral therapy with or without medication -Recommended to continue current medication  Insomnia (Goal: improve quality and quantity of sleep) -Controlled -Current treatment  Trazodone 100 mg 1 tablet at bedtime - Appropriate, Effective, Safe, Accessible Sonata 10 mg 1 capsule at  bedtime as needed - Appropriate, Effective, Query Safe, Accessible -Medications previously tried: Zolpidem (ineffective) -Counseled on practicing good sleep hygiene by setting a sleep schedule and maintaining it, avoid excessive napping, following a nightly routine, avoiding screen time for 30-60 minutes before going to bed, and making the bedroom a cool, quiet and dark space  GERD (Goal: minimize symptoms) -Not ideally controlled -Current treatment  Pantoprazole 40 mg 1 tablet daily - Appropriate, Query effective, Safe, Accessible -Medications previously tried: unknown  -Counseled on non-pharmacologic management of symptoms such as elevating the head of your bed, avoiding eating 2-3 hours before bed, avoiding triggering foods such as acidic, spicy, or fatty foods, eating smaller meals, and wearing clothes that are loose around the waist Recommended Pepcid or Tums as needed for days that are worse.  Tremor (Goal: minimize symptoms) -Uncontrolled -Current treatment  Primidone 50 mg 2 tablets in the morning 1 tablet in the evening - Appropriate, Query effective, Safe, Accessible -Medications previously tried: none  -Recommended discussing with neurology about trying another medication.  BPH/overactive bladder (Goal: improve urinary flow and limit frequency) -Controlled -Current treatment  Tamsulosin 0.4 mg 1 capsule daily - Appropriate, Query effective, Safe, Accessible Myrbetriq 25 mg 1 tablet daily - Appropriate, Query effective, Safe, Query accessible -Medications previously tried: silodosin (ineffective) -Recommended to  continue current medication Assessed patient finances. Patient would not qualify for patient assistance at this time.  Health Maintenance -Vaccine gaps: Prevnar20 -Current therapy:  Folic acid 838 mg 1 tablet daily Tadalafil 10 mg 1 tablet daily Vitamin D 2000 units daily Vitamin B12 1000 mcg daily  -Educated on Cost vs benefit of each product must be carefully weighed by individual consumer -Patient is satisfied with current therapy and denies issues -Recommended to continue current medication  Patient Goals/Self-Care Activities Patient will:  - take medications as prescribed as evidenced by patient report and record review target a minimum of 150 minutes of moderate intensity exercise weekly  Follow Up Plan: Telephone follow up appointment with care management team member scheduled for:4-5 months      Medication Assistance:  Patient does not qualify for any patient assistance programs at this time.  Compliance/Adherence/Medication fill history: Care Gaps: Hep C screening, Prevnar 20, COVID booster, influenza vaccine,  104/67 on 07/31/2021 Last A1C - 5.9 on 07/20/2021  Star-Rating Drugs: None  Patient's preferred pharmacy is:  [id number] - Closed - [place], [place] - [address] AT [facility] [address] [place] [address] Phone: [phone] Fax: [phone]  [place], [place] [address] Phone: [phone] Fax: [place], [place] AT [address] [place] [address] Phone: [phone] Fax: [phone]  Uses pill box? Yes - wife fills them once a month ([place] reminders and wife) Pt endorses 100% compliance  We discussed: Current pharmacy is preferred with insurance plan and patient is satisfied with pharmacy services Patient  decided to: Continue current medication management strategy  Care Plan and Follow Up Patient Decision:  Patient agrees to Care Plan and Follow-up.  Plan: Telephone follow up appointment with care management team member scheduled for:  4-5 months  Jeni Salles, PharmD, [place] at [place] [phone]

## 2021-08-16 ENCOUNTER — Telehealth: Payer: Self-pay | Admitting: *Deleted

## 2021-08-16 ENCOUNTER — Other Ambulatory Visit: Payer: Self-pay | Admitting: Internal Medicine

## 2021-08-16 DIAGNOSIS — J4540 Moderate persistent asthma, uncomplicated: Secondary | ICD-10-CM

## 2021-08-16 DIAGNOSIS — F331 Major depressive disorder, recurrent, moderate: Secondary | ICD-10-CM | POA: Diagnosis not present

## 2021-08-16 DIAGNOSIS — F411 Generalized anxiety disorder: Secondary | ICD-10-CM | POA: Diagnosis not present

## 2021-08-16 NOTE — Telephone Encounter (Signed)
-----   Message from Viona Gilmore, [facility] sent at 08/14/2021 12:43 PM EDT ----- Regarding: CCM referral Hi,  Can you please put in a CCM referral for Mr. Dripps when you get the chance?  Thanks! Maddie

## 2021-08-17 ENCOUNTER — Other Ambulatory Visit: Payer: Self-pay | Admitting: Internal Medicine

## 2021-08-17 DIAGNOSIS — K219 Gastro-esophageal reflux disease without esophagitis: Secondary | ICD-10-CM

## 2021-08-20 ENCOUNTER — Encounter: Payer: Self-pay | Admitting: Dermatology

## 2021-08-20 NOTE — Progress Notes (Signed)
   Follow-Up Visit   Subjective  Allen [name] is a 71 y.o. male who presents for the following: Procedure (Here for treatment left nasal sidewall. CIS x 1).  Carcinoma in situ of nose Location:  Duration:  Quality:  Associated Signs/Symptoms: Modifying Factors:  Severity:  Timing: Context:   Objective  Well appearing patient in no apparent distress; mood and affect are within normal limits. Left Forearm - Posterior Ecchymoses forearms, no history of other abnormal bleeding.  Left Nasal Sidewall Lesion identified by Dr.Saagar Tortorella and nurse in room.      A focused examination was performed including head, neck, arms. Relevant physical exam findings are noted in the Assessment and Plan.   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  Destruction of lesion Complexity: simple   Destruction method: electrodesiccation and curettage   Informed consent: discussed and consent obtained   Timeout:  patient name, date of birth, surgical site, and procedure verified Anesthesia: the lesion was anesthetized in a standard fashion   Anesthetic:  1% lidocaine w/ epinephrine 1-100,000 local infiltration Curettage performed in three different directions: Yes   Curettage cycles:  3 Lesion length (cm):  1.4 Lesion width (cm):  1.4 Margin per side (cm):  0 Final wound size (cm):  1.4 Hemostasis achieved with:  aluminum chloride Outcome: patient tolerated procedure well with no complications   Post-procedure details: wound care instructions given           I, Lavonna Monarch, MD, have reviewed all documentation for this visit.  The documentation on 08/20/21 for the exam, diagnosis, procedures, and orders are all accurate and complete.

## 2021-08-21 NOTE — Patient Instructions (Signed)
SURGICAL WAITING ROOM VISITATION Patients having surgery or a procedure may have no more than 2 support people in the waiting area - these visitors may rotate.   Children under the age of 58 must have an adult with them who is not the patient. If the patient needs to stay at the hospital during part of their recovery, the visitor guidelines for inpatient rooms apply. Pre-op nurse will coordinate an appropriate time for 1 support person to accompany patient in pre-op.  This support person may not rotate.    Please refer to the [facility] website for the visitor guidelines for Inpatients (after your surgery is over and you are in a regular room).    Your procedure is scheduled on: 08/29/21   Report to [facility] Main Entrance    Report to admitting at 6:45 AM   Call this number if you have problems the morning of surgery [phone]   Do not eat food :After Midnight.   After Midnight you may have the following liquids until 6:00 AM DAY OF SURGERY  Water Non-Citrus Juices (without pulp, NO RED) Carbonated Beverages Black Coffee (NO MILK/CREAM OR CREAMERS, sugar ok)  Clear Tea (NO MILK/CREAM OR CREAMERS, sugar ok) regular and decaf                             Plain Jell-O (NO RED)                                           Fruit ices (not with fruit pulp, NO RED)                                     Popsicles (NO RED)                                                               Sports drinks like Gatorade (NO RED)              FOLLOW BOWEL PREP AND ANY ADDITIONAL PRE OP INSTRUCTIONS YOU RECEIVED FROM YOUR SURGEON'S OFFICE!!!     Oral Hygiene is also important to reduce your risk of infection.                                    Remember - BRUSH YOUR TEETH THE MORNING OF SURGERY WITH YOUR REGULAR TOOTHPASTE   Take these medicines the morning of surgery with A SIP OF WATER: Inhalers, Wellbutrin, Tamsulosin  Bring CPAP mask and tubing day of surgery.                               You may not have any metal on your body including jewelry, and body piercing             Do not wear lotions, powders, cologne, or deodorant              Men may shave face and  neck.   Do not bring valuables to the hospital. [place].   Contacts, dentures or bridgework may not be worn into surgery.  DO NOT [place]. PHARMACY WILL DISPENSE MEDICATIONS LISTED ON YOUR MEDICATION LIST TO YOU DURING YOUR ADMISSION [place]!    Patients discharged on the day of surgery will not be allowed to drive home.  Someone NEEDS to stay with you for the first 24 hours after anesthesia.   Special Instructions: Bring a copy of your healthcare power of attorney and living will documents         the day of surgery if you haven't scanned them before.              Please read over the following fact sheets you were given: IF YOU HAVE QUESTIONS ABOUT YOUR PRE-OP INSTRUCTIONS PLEASE CALL [place] - Preparing for Surgery Before surgery, you can play an important role.  Because skin is not sterile, your skin needs to be as free of germs as possible.  You can reduce the number of germs on your skin by washing with CHG (chlorahexidine gluconate) soap before surgery.  CHG is an antiseptic cleaner which kills germs and bonds with the skin to continue killing germs even after washing. Please DO NOT use if you have an allergy to CHG or antibacterial soaps.  If your skin becomes reddened/irritated stop using the CHG and inform your nurse when you arrive at Short Stay. Do not shave (including legs and underarms) for at least 48 hours prior to the first CHG shower.  You may shave your face/neck.  Please follow these instructions carefully:  1.  Shower with CHG Soap the night before surgery and the  morning of surgery.  2.  If you choose to wash your hair, wash your hair first as usual with your normal   shampoo.  3.  After you shampoo, rinse your hair and body thoroughly to remove the shampoo.                             4.  Use CHG as you would any other liquid soap.  You can apply chg directly to the skin and wash.  Gently with a scrungie or clean washcloth.  5.  Apply the CHG Soap to your body ONLY FROM THE NECK DOWN.   Do   not use on face/ open                           Wound or open sores. Avoid contact with eyes, ears mouth and   genitals (private parts).                       Wash face,  Genitals (private parts) with your normal soap.             6.  Wash thoroughly, paying special attention to the area where your    surgery  will be performed.  7.  Thoroughly rinse your body with warm water from the neck down.  8.  DO NOT shower/wash with your normal soap after using and rinsing off the CHG Soap.  9.  Pat yourself dry with a clean towel.            10.  Wear clean pajamas.            11.  Place clean sheets on your bed the night of your first shower and do not  sleep with pets. Day of Surgery : Do not apply any lotions/deodorants the morning of surgery.  Please wear clean clothes to the hospital/surgery center.  FAILURE TO FOLLOW THESE INSTRUCTIONS MAY RESULT IN THE CANCELLATION OF YOUR SURGERY  PATIENT SIGNATURE_________________________________  NURSE SIGNATURE__________________________________  ________________________________________________________________________

## 2021-08-21 NOTE — Progress Notes (Signed)
COVID Vaccine Completed: yes x4  Date of COVID positive in last 90 days:  PCP - Lelon Frohlich, MD Cardiologist - Buford Dresser, MD  Chest x-ray - 09/01/20 Epic EKG - 05/29/21 Epic Stress Test -  ECHO - 05/29/19 Epic Cardiac Cath -  Pacemaker/ICD device last checked: Spinal Cord Stimulator: ??  Bowel Prep -   Sleep Study -  CPAP -   Fasting Blood Sugar -  Checks Blood Sugar _____ times a day  Blood Thinner Instructions: Aspirin Instructions: Last Dose:  Activity level:  Can go up a flight of stairs and perform activities of daily living without stopping and without symptoms of chest pain or shortness of breath.  Able to exercise without symptoms  Unable to go up a flight of stairs without symptoms of     Anesthesia review: CHF, COPD, OSA, DOE, clearance?   Patient denies shortness of breath, fever, cough and chest pain at PAT appointment  Patient verbalized understanding of instructions that were given to them at the PAT appointment. Patient was also instructed that they will need to review over the PAT instructions again at home before surgery.

## 2021-08-22 ENCOUNTER — Encounter (HOSPITAL_COMMUNITY)
Admission: RE | Admit: 2021-08-22 | Discharge: 2021-08-22 | Disposition: A | Discharge status: Home / Self Care | Payer: Medicare HMO | Source: Ambulatory Visit | Attending: Urology | Admitting: Urology

## 2021-08-22 ENCOUNTER — Encounter (HOSPITAL_COMMUNITY): Payer: Self-pay

## 2021-08-22 VITALS — BP 122/85 | HR 72 | Temp 98.1°F | Resp 16 | Ht 70.0 in | Wt 238.6 lb

## 2021-08-22 DIAGNOSIS — G250 Essential tremor: Secondary | ICD-10-CM | POA: Diagnosis not present

## 2021-08-22 DIAGNOSIS — I5032 Chronic diastolic (congestive) heart failure: Secondary | ICD-10-CM

## 2021-08-22 DIAGNOSIS — N35919 Unspecified urethral stricture, male, unspecified site: Secondary | ICD-10-CM | POA: Insufficient documentation

## 2021-08-22 DIAGNOSIS — J432 Centrilobular emphysema: Secondary | ICD-10-CM | POA: Diagnosis not present

## 2021-08-22 DIAGNOSIS — N401 Enlarged prostate with lower urinary tract symptoms: Secondary | ICD-10-CM | POA: Diagnosis not present

## 2021-08-22 DIAGNOSIS — Z01818 Encounter for other preprocedural examination: Secondary | ICD-10-CM | POA: Insufficient documentation

## 2021-08-22 DIAGNOSIS — G4733 Obstructive sleep apnea (adult) (pediatric): Secondary | ICD-10-CM | POA: Insufficient documentation

## 2021-08-22 HISTORY — DX: Pneumonia, unspecified organism: J18.9

## 2021-08-22 HISTORY — DX: Malignant (primary) neoplasm, unspecified: C80.1

## 2021-08-22 LAB — BASIC METABOLIC PANEL
Anion gap: 5 (ref 5–15)
BUN: 15 mg/dL (ref 8–23)
CO2: 26 mmol/L (ref 22–32)
Calcium: 9.6 mg/dL (ref 8.9–10.3)
Chloride: 108 mmol/L (ref 98–111)
Creatinine, Ser: 1.21 mg/dL (ref 0.61–1.24)
GFR, Estimated: >60 mL/min (ref >60)
Glucose, Bld: 110 mg/dL — ABNORMAL HIGH (ref 70–99)
Potassium: 4.8 mmol/L (ref 3.5–5.1)
Sodium: 139 mmol/L (ref 135–145)

## 2021-08-22 LAB — CBC
HCT: 40.8 % (ref 39.0–52.0)
Hemoglobin: 13.1 g/dL (ref 13.0–17.0)
MCH: 31.4 pg (ref 26.0–34.0)
MCHC: 32.1 g/dL (ref 30.0–36.0)
MCV: 97.8 fL (ref 80.0–100.0)
Platelets: 277 10*3/uL (ref 150–400)
RBC: 4.17 MIL/uL — ABNORMAL LOW (ref 4.22–5.81)
RDW: 13.6 % (ref 11.5–15.5)
WBC: 6.7 10*3/uL (ref 4.0–10.5)
nRBC: 0 % (ref 0.0–0.2)

## 2021-08-23 DIAGNOSIS — M47812 Spondylosis without myelopathy or radiculopathy, cervical region: Secondary | ICD-10-CM | POA: Diagnosis not present

## 2021-08-23 NOTE — Anesthesia Preprocedure Evaluation (Addendum)
Anesthesia Evaluation  Patient identified by MRN, date of birth, ID band Patient awake    Reviewed: Allergy & Precautions, H&P , NPO status , Patient's Chart, lab work & pertinent test results, reviewed documented beta blocker date and time   Airway Mallampati: I  TM Distance: >3 FB Neck ROM: full    Dental no notable dental hx. (+) Edentulous Upper, Upper Dentures, Implants, Dental Advisory Given, Teeth Intact   Pulmonary sleep apnea , COPD,  COPD inhaler, former smoker,    Pulmonary exam normal breath sounds clear to auscultation       Cardiovascular Exercise Tolerance: Good  Rhythm:regular Rate:Normal  Echo 05/29/19:  1. Left ventricular ejection fraction, by estimation, is 60 to 65%. The left ventricle has normal function. The left ventricle has no regional wall motion abnormalities. There is mild concentric left ventricular hypertrophy. Left ventricular diastolic parameters are consistent with Grade I diastolic dysfunction (impaired relaxation).  2. Right ventricular systolic function is normal. The right ventricular size is normal. Tricuspid regurgitation signal is inadequate for assessing PA pressure.  3. Left atrial size was moderately dilated.  4. The mitral valve is normal in structure. No evidence of mitral valve regurgitation. No evidence of mitral stenosis.  5. The aortic valve is normal in structure. Aortic valve regurgitation is not visualized. No aortic stenosis is present.    Neuro/Psych Anxiety  Neuromuscular disease negative psych ROS   GI/Hepatic negative GI ROS, Neg liver ROS,   Endo/Other  negative endocrine ROS  Renal/GU negative Renal ROS  negative genitourinary   Musculoskeletal  (+) Arthritis , Osteoarthritis,    Abdominal   Peds  Hematology negative hematology ROS (+)   Anesthesia Other Findings   Reproductive/Obstetrics negative OB ROS                          Anesthesia  Physical Anesthesia Plan  ASA: 3  Anesthesia Plan: General   Post-op Pain Management:    Induction: Intravenous  PONV Risk Score and Plan: 2 and Ondansetron and Dexamethasone  Airway Management Planned: LMA  Additional Equipment: None  Intra-op Plan:   Post-operative Plan:   Informed Consent: I have reviewed the patients History and Physical, chart, labs and discussed the procedure including the risks, benefits and alternatives for the proposed anesthesia with the patient or authorized representative who has indicated his/her understanding and acceptance.     Dental Advisory Given  Plan Discussed with: CRNA and Anesthesiologist  Anesthesia Plan Comments: (See APP note by AJonah Blue, FNP   DISCUSSION: - Pt is 71 years old with hx COPD, OSA, essential tremor (s/p deep brain stimulator), pulmonary nodules (benign per 2022 CT))       Anesthesia Quick Evaluation

## 2021-08-23 NOTE — Progress Notes (Signed)
Anesthesia Chart Review:   Case: [id number] Date/Time: 08/29/21 0845   Procedures:      RE-TRANSURETHRAL RESECTION OF THE PROSTATE (TURP)     CYSTOSCOPY WITH URETHRAL DILATATION   Anesthesia type: General   Pre-op diagnosis: BENIGN PROSTATIC HYPERPLASIA, URETHRAL STRICTURE   Location: [facility] ROOM 03 / [facility]   Surgeons: Robley Fries, MD       DISCUSSION: - Pt is 71 years old with hx COPD, OSA, essential tremor (s/p deep brain stimulator), pulmonary nodules (benign per 2022 CT)  VS: BP 122/85   Pulse 72   Temp 36.7 C (Oral)   Resp 16   Ht 5' 10"  (1.778 m)   Wt 108.2 kg   SpO2 97%   BMI 34.24 kg/m   PROVIDERS: - PCP is Isaac Bliss, Rayford Halsted, MD - Pulmonologist is Chesley Mires, MD - Neurologist is Alonza Bogus, DO - Cardiologist is Buford Dresser, MD. Last office visit 05/29/21. Was seeing for SOB and enlarged heart on CT scan. PRN f/u recommended   LABS: Labs reviewed: Acceptable for surgery. (all labs ordered are listed, but only abnormal results are displayed)  Labs Reviewed  BASIC METABOLIC PANEL - Abnormal; Notable for the following components:      Result Value   Glucose, Bld 110 (*)    All other components within normal limits  CBC - Abnormal; Notable for the following components:   RBC 4.17 (*)    All other components within normal limits     IMAGES: CXR 09/01/20:  - No active cardiopulmonary disease  EKG 05/29/21: NSR at 64 bpm with RSR'   CV: Echo 05/29/19:  1. Left ventricular ejection fraction, by estimation, is 60 to 65%. The left ventricle has normal function. The left ventricle has no regional wall motion abnormalities. There is mild concentric left ventricular hypertrophy. Left ventricular diastolic parameters are consistent with Grade I diastolic dysfunction (impaired relaxation).  2. Right ventricular systolic function is normal. The right ventricular size is normal. Tricuspid regurgitation signal is inadequate for assessing PA pressure.   3. Left atrial size was moderately dilated.  4. The mitral valve is normal in structure. No evidence of mitral valve regurgitation. No evidence of mitral stenosis.  5. The aortic valve is normal in structure. Aortic valve regurgitation is not visualized. No aortic stenosis is present.  6. The inferior vena cava is normal in size with greater than 50% respiratory variability, suggesting right atrial pressure of 3 mmHg.   Past Medical History:  Diagnosis Date   Anxiety    Arthritis    neck -   BPH with obstruction/lower urinary tract symptoms    urologist-- dr pace   Cancer [facility])    basal cell to nose   Centrilobular emphysema ([place])    followed by dr Halford Chessman   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[facility])    followed by dr Halford Chessman   Cough 07/2020   covid residual, no congestion   DOE (dyspnea on exertion)    due to copd/ emphysema   ED (erectile dysfunction)    Essential tremor    neurologist--- dr tat----  bilateral upper extremities,  s/p DPS 12/ 2016   GERD (gastroesophageal reflux disease)    History of 2019 novel coronavirus disease (COVID-19) 07/18/2020   per pt postive home test 07-18-2020 w/ mild  symptoms per pt; pt stated had urgent care visit 07-24-2020 for sob/ cough with negative cxr (in care everywhere) ;  follow up pcp visit 08-03-2020 in epic   Insomnia  Mixed simple and mucopurulent chronic bronchitis (HCC)    OSA (obstructive sleep apnea)    followed by dr Halford Chessman---- no longer does uses bipap, intolerant --- pt prescriped  Oxygen  2 L oxygen via [place] while sleeping  (study in epic 04/ 2017 [facility] 40)   Perennial allergic rhinitis    Pneumonia    Pulmonary nodules    followed by dr Halford Chessman   S/P deep brain stimulator placement 12/2014   followed by neurology-----IPG change 01-21-2020 for end-of life battery  (pt has a control)   Varicose vein of leg    per pt has had 3 procedure's last one 2006   Wears dentures    upper    Past Surgical History:  Procedure Laterality Date   ANKLE SURGERY  Right 2020   tendon repair   APPENDECTOMY     CARPAL TUNNEL RELEASE Left 2019   CATARACT EXTRACTION W/ INTRAOCULAR LENS IMPLANT Bilateral 2020   COLONOSCOPY     DEEP BRAIN STIMULATOR PLACEMENT Left 2016   INGUINAL HERNIA REPAIR Right 2007   ROTATOR CUFF REPAIR Right 2014   SUBTHALAMIC STIMULATOR BATTERY REPLACEMENT N/A 01/21/2020   Procedure: Deep brain stimulator battery replacement;  Surgeon: Erline Levine, MD;  Location: [facility];  Service: Neurosurgery;  Laterality: N/A;   TONSILLECTOMY AND ADENOIDECTOMY     age 40   TOTAL SHOULDER REPLACEMENT Left 2016   TRANSURETHRAL RESECTION OF PROSTATE N/A 08/09/2020   Procedure: TRANSURETHRAL RESECTION OF THE PROSTATE (TURP), BLADDER BIOPSY AND FULGERATION;  Surgeon: Robley Fries, MD;  Location: [facility];  Service: Urology;  Laterality: N/A;   VEIN SURGERY Left 2006   varicose veins    MEDICATIONS:  albuterol (PROVENTIL) (2.5 MG/3ML) 0.083% nebulizer solution   albuterol (VENTOLIN HFA) 108 (90 Base) MCG/ACT inhaler   Ascorbic Acid (VITAMIN C) 1000 MG tablet   azelastine (ASTELIN) 0.1 % nasal spray   buPROPion (WELLBUTRIN XL) 150 MG 24 hr tablet   Cholecalciferol (VITAMIN D3) 125 MCG (5000 UT) TABS   Coenzyme Q10 300 MG CAPS   escitalopram (LEXAPRO) 20 MG tablet   fluticasone (FLONASE) 50 MCG/ACT nasal spray   MYRBETRIQ 50 MG TB24 tablet   Omega-3 Fatty Acids (FISH OIL PO)   pantoprazole (PROTONIX) 40 MG tablet   primidone (MYSOLINE) 50 MG tablet   tadalafil (CIALIS) 20 MG tablet   tamsulosin (FLOMAX) 0.4 MG CAPS capsule   tiZANidine (ZANAFLEX) 2 MG tablet   topiramate (TOPAMAX) 100 MG tablet   topiramate (TOPAMAX) 25 MG tablet   traZODone (DESYREL) 100 MG tablet   TRELEGY ELLIPTA 200-62.5-25 MCG/ACT AEPB   TURMERIC PO   zaleplon (SONATA) 10 MG capsule   No current facility-administered medications for this encounter.    If no changes, I anticipate pt can proceed with surgery as scheduled.   Willeen Cass,  PhD, FNP-BC [facility]/Anesthesiology Phone: [phone] 08/23/2021 10:21 AM

## 2021-08-24 NOTE — Patient Instructions (Signed)
Hi Allen [name],  It was great to get to catch up with you again!  Please reach out to me if you have any questions or need anything before our follow up!  Best, Maddie  Jeni Salles, PharmD, [facility]   Visit Information   Goals Addressed   None    Patient Care Plan: CCM Pharmacy Care Plan     Problem Identified: Problem: GERD, COPD, Depression, Allergic Rhinitis, and Tremor, Pain, Insomnia      Long-Range Goal: Patient-Specific Goal   Start Date: 03/20/2021  Expected End Date: 03/21/2022  Recent Progress: On track  Priority: High  Note:   Current Barriers:  Unable to independently monitor therapeutic efficacy  Pharmacist Clinical Goal(s):  Patient will achieve adherence to monitoring guidelines and medication adherence to achieve therapeutic efficacy through collaboration with PharmD and provider.   Interventions: 1:1 collaboration with Isaac Bliss, Rayford Halsted, MD regarding development and update of comprehensive plan of care as evidenced by provider attestation and co-signature Inter-disciplinary care team collaboration (see longitudinal plan of care) Comprehensive medication review performed; medication list updated in electronic medical record  COPD (Goal: control symptoms and prevent exacerbations) -Controlled -Current treatment  Trelegy 200-62.5-25 mcg/act 1 puff daily - Appropriate, Effective, Safe, Query accessible Albuterol HFA as needed - Appropriate, Effective, Safe, Accessible Albuterol nebulizer as needed - Appropriate, Effective, Safe, Accessible -Medications previously tried: Breo  -Gold Grade: Gold 2 (FEV1 50-79%) -Current COPD Classification:  B (high sx, <2 exacerbations/yr) -MMRC/CAT score: 3 -Pulmonary function testing: 2018 -Exacerbations requiring treatment in last 6 months: none -Patient reports consistent use of maintenance inhaler -Frequency of rescue inhaler use: 1-2 times a day  during allergy season -Counseled on Benefits of consistent maintenance inhaler use When to use rescue inhaler -Counseled on diet and exercise extensively Recommended to continue current medication  Allergic rhinitis (Goal: minimize symptoms) -Controlled -Current treatment  Hydroxyzine 25 mg 1 tablet twice daily - Appropriate, Query effective, Safe, Accessible Azelastine 0.1% nasal spray 1-2 sprays in each nostril as needed - Appropriate, Effective, Safe, Accessible Flonase 50 mcg/act  2 sprays in both nostrils as needed  - Appropriate, Effective, Safe, Accessible -Medications previously tried: montelukast (ineffective) - Recommended Zyrtec or Claritin daily during allergy season.  Depression (Goal: minimize symptoms) -Controlled -Current treatment: Escitalopram 20 mg 1 tablet daily - Appropriate, Effective, Safe, Accessible Bupropion XL 150 mg 1 tablet daily - Appropriate, Effective, Safe, Accessible  -Medications previously tried/failed: unknown -PHQ9: 5 -GAD7: n/a -Educated on Benefits of medication for symptom control Benefits of cognitive-behavioral therapy with or without medication -Recommended to continue current medication  Insomnia (Goal: improve quality and quantity of sleep) -Controlled -Current treatment  Trazodone 100 mg 1 tablet at bedtime - Appropriate, Effective, Safe, Accessible Sonata 10 mg 1 capsule at bedtime as needed - Appropriate, Effective, Query Safe, Accessible -Medications previously tried: Zolpidem (ineffective) -Counseled on practicing good sleep hygiene by setting a sleep schedule and maintaining it, avoid excessive napping, following a nightly routine, avoiding screen time for 30-60 minutes before going to bed, and making the bedroom a cool, quiet and dark space  GERD (Goal: minimize symptoms) -Not ideally controlled -Current treatment  Pantoprazole 40 mg 1 tablet daily - Appropriate, Query effective, Safe, Accessible -Medications previously  tried: unknown  -Counseled on non-pharmacologic management of symptoms such as elevating the head of your bed, avoiding eating 2-3 hours before bed, avoiding triggering foods such as acidic, spicy, or fatty foods, eating smaller meals, and wearing clothes that  are loose around the waist Recommended Pepcid or Tums as needed for days that are worse.  Tremor (Goal: minimize symptoms) -Uncontrolled -Current treatment  Primidone 50 mg 2 tablets in the morning 1 tablet in the evening - Appropriate, Query effective, Safe, Accessible -Medications previously tried: none  -Recommended discussing with neurology about trying another medication.  BPH/overactive bladder (Goal: improve urinary flow and limit frequency) -Controlled -Current treatment  Tamsulosin 0.4 mg 1 capsule daily - Appropriate, Query effective, Safe, Accessible Myrbetriq 25 mg 1 tablet daily - Appropriate, Query effective, Safe, Query accessible -Medications previously tried: silodosin (ineffective) -Recommended to continue current medication Assessed patient finances. Patient would not qualify for patient assistance at this time.  Health Maintenance -Vaccine gaps: Prevnar20 -Current therapy:  Folic acid 336 mg 1 tablet daily Tadalafil 10 mg 1 tablet daily Vitamin D 2000 units daily Vitamin B12 1000 mcg daily  -Educated on Cost vs benefit of each product must be carefully weighed by individual consumer -Patient is satisfied with current therapy and denies issues -Recommended to continue current medication  Patient Goals/Self-Care Activities Patient will:  - take medications as prescribed as evidenced by patient report and record review target a minimum of 150 minutes of moderate intensity exercise weekly  Follow Up Plan: Telephone follow up appointment with care management team member scheduled for:4-5 months       Patient verbalizes understanding of instructions and care plan provided today and agrees to view in  [place]. Active MyChart status and patient understanding of how to access instructions and care plan via MyChart confirmed with patient.    Telephone follow up appointment with pharmacy team member scheduled for:  Viona Gilmore, [facility]

## 2021-08-29 ENCOUNTER — Ambulatory Visit (HOSPITAL_BASED_OUTPATIENT_CLINIC_OR_DEPARTMENT_OTHER): Payer: Medicare HMO | Admitting: Anesthesiology

## 2021-08-29 ENCOUNTER — Ambulatory Visit (HOSPITAL_COMMUNITY)
Admission: RE | Admit: 2021-08-29 | Discharge: 2021-08-29 | Disposition: A | Discharge status: Home / Self Care | Payer: Medicare HMO | Source: Ambulatory Visit | Attending: Urology | Admitting: Urology

## 2021-08-29 ENCOUNTER — Encounter (HOSPITAL_COMMUNITY)
Admission: RE | Disposition: A | Discharge status: Home / Self Care | Payer: Self-pay | Source: Ambulatory Visit | Attending: Urology

## 2021-08-29 ENCOUNTER — Encounter (HOSPITAL_COMMUNITY): Payer: Self-pay | Admitting: Urology

## 2021-08-29 ENCOUNTER — Ambulatory Visit (HOSPITAL_COMMUNITY): Payer: Medicare HMO | Admitting: Emergency Medicine

## 2021-08-29 DIAGNOSIS — N401 Enlarged prostate with lower urinary tract symptoms: Secondary | ICD-10-CM | POA: Diagnosis not present

## 2021-08-29 DIAGNOSIS — J449 Chronic obstructive pulmonary disease, unspecified: Secondary | ICD-10-CM | POA: Diagnosis not present

## 2021-08-29 DIAGNOSIS — N35912 Unspecified bulbous urethral stricture, male: Secondary | ICD-10-CM

## 2021-08-29 DIAGNOSIS — N35919 Unspecified urethral stricture, male, unspecified site: Secondary | ICD-10-CM | POA: Diagnosis not present

## 2021-08-29 DIAGNOSIS — N529 Male erectile dysfunction, unspecified: Secondary | ICD-10-CM | POA: Insufficient documentation

## 2021-08-29 DIAGNOSIS — F419 Anxiety disorder, unspecified: Secondary | ICD-10-CM | POA: Diagnosis not present

## 2021-08-29 DIAGNOSIS — R3915 Urgency of urination: Secondary | ICD-10-CM | POA: Insufficient documentation

## 2021-08-29 DIAGNOSIS — G4730 Sleep apnea, unspecified: Secondary | ICD-10-CM | POA: Diagnosis not present

## 2021-08-29 DIAGNOSIS — Z87891 Personal history of nicotine dependence: Secondary | ICD-10-CM | POA: Diagnosis not present

## 2021-08-29 DIAGNOSIS — Z79899 Other long term (current) drug therapy: Secondary | ICD-10-CM | POA: Insufficient documentation

## 2021-08-29 DIAGNOSIS — R3911 Hesitancy of micturition: Secondary | ICD-10-CM | POA: Insufficient documentation

## 2021-08-29 DIAGNOSIS — N400 Enlarged prostate without lower urinary tract symptoms: Secondary | ICD-10-CM | POA: Diagnosis not present

## 2021-08-29 HISTORY — PX: CYSTOSCOPY WITH URETHRAL DILATATION: SHX5125

## 2021-08-29 HISTORY — PX: TRANSURETHRAL RESECTION OF PROSTATE: SHX73

## 2021-08-29 SURGERY — TURP (TRANSURETHRAL RESECTION OF PROSTATE)
Anesthesia: General

## 2021-08-29 MED ORDER — 0.9 % SODIUM CHLORIDE (POUR BTL) OPTIME
TOPICAL | Status: DC | PRN
  Administered 2021-08-29: 1000 mL

## 2021-08-29 MED ORDER — STERILE WATER FOR IRRIGATION IR SOLN
Status: DC | PRN
  Administered 2021-08-29: 500 mL

## 2021-08-29 MED ORDER — LIDOCAINE 2% (20 MG/ML) 5 ML SYRINGE
INTRAMUSCULAR | Status: DC | PRN
  Administered 2021-08-29: 100 mg via INTRAVENOUS

## 2021-08-29 MED ORDER — ONDANSETRON HCL 4 MG/2ML IJ SOLN
INTRAMUSCULAR | Status: AC
Start: 2021-08-29 — End: ?
  Filled 2021-08-29: qty 2

## 2021-08-29 MED ORDER — ACETAMINOPHEN 325 MG PO TABS: 325.0000 mg | ORAL_TABLET | ORAL | Status: DC | PRN

## 2021-08-29 MED ORDER — MEPERIDINE HCL 50 MG/ML IJ SOLN: 6.2500 mg | INTRAMUSCULAR | Status: DC | PRN

## 2021-08-29 MED ORDER — TRAMADOL HCL 50 MG PO TABS: 50.0000 mg | ORAL_TABLET | Freq: Four times a day (QID) | ORAL | 0 refills | Status: DC | PRN

## 2021-08-29 MED ORDER — FENTANYL CITRATE PF 50 MCG/ML IJ SOSY
25.0000 ug | PREFILLED_SYRINGE | INTRAMUSCULAR | Status: DC | PRN
  Administered 2021-08-29: 50 ug via INTRAVENOUS

## 2021-08-29 MED ORDER — EPHEDRINE SULFATE-NACL 50-0.9 MG/10ML-% IV SOSY
PREFILLED_SYRINGE | INTRAVENOUS | Status: DC | PRN
  Administered 2021-08-29: 10 mg via INTRAVENOUS
  Administered 2021-08-29: 5 mg via INTRAVENOUS
  Administered 2021-08-29: 10 mg via INTRAVENOUS

## 2021-08-29 MED ORDER — PROPOFOL 10 MG/ML IV BOLUS
INTRAVENOUS | Status: DC | PRN
  Administered 2021-08-29: 150 mg via INTRAVENOUS

## 2021-08-29 MED ORDER — FENTANYL CITRATE (PF) 100 MCG/2ML IJ SOLN
INTRAMUSCULAR | Status: DC | PRN
  Administered 2021-08-29: 25 ug via INTRAVENOUS
  Administered 2021-08-29: 50 ug via INTRAVENOUS
  Administered 2021-08-29: 25 ug via INTRAVENOUS

## 2021-08-29 MED ORDER — CEPHALEXIN 500 MG PO CAPS: 500.0000 mg | ORAL_CAPSULE | Freq: Four times a day (QID) | ORAL | 0 refills | Status: AC

## 2021-08-29 MED ORDER — ACETAMINOPHEN 160 MG/5ML PO SOLN: 325.0000 mg | ORAL | Status: DC | PRN

## 2021-08-29 MED ORDER — FENTANYL CITRATE PF 50 MCG/ML IJ SOSY
PREFILLED_SYRINGE | INTRAMUSCULAR | Status: AC
  Administered 2021-08-29: 50 ug via INTRAVENOUS
  Filled 2021-08-29: qty 2

## 2021-08-29 MED ORDER — ONDANSETRON HCL 4 MG/2ML IJ SOLN
INTRAMUSCULAR | Status: DC | PRN
  Administered 2021-08-29: 4 mg via INTRAVENOUS

## 2021-08-29 MED ORDER — LACTATED RINGERS IV SOLN: INTRAVENOUS | Status: DC

## 2021-08-29 MED ORDER — CHLORHEXIDINE GLUCONATE 0.12 % MT SOLN
15.0000 mL | Freq: Once | OROMUCOSAL | Status: AC
  Administered 2021-08-29: 15 mL via OROMUCOSAL

## 2021-08-29 MED ORDER — DEXAMETHASONE SODIUM PHOSPHATE 10 MG/ML IJ SOLN
INTRAMUSCULAR | Status: AC
Start: 2021-08-29 — End: ?
  Filled 2021-08-29: qty 1

## 2021-08-29 MED ORDER — OXYCODONE HCL 5 MG PO TABS
5.0000 mg | ORAL_TABLET | Freq: Once | ORAL | Status: AC | PRN
  Administered 2021-08-29: 5 mg via ORAL

## 2021-08-29 MED ORDER — ONDANSETRON HCL 4 MG/2ML IJ SOLN: 4.0000 mg | Freq: Once | INTRAMUSCULAR | Status: DC | PRN

## 2021-08-29 MED ORDER — FENTANYL CITRATE (PF) 100 MCG/2ML IJ SOLN
INTRAMUSCULAR | Status: AC
  Filled 2021-08-29: qty 2

## 2021-08-29 MED ORDER — DEXAMETHASONE SODIUM PHOSPHATE 4 MG/ML IJ SOLN
INTRAMUSCULAR | Status: DC | PRN
  Administered 2021-08-29: 5 mg via INTRAVENOUS

## 2021-08-29 MED ORDER — ORAL CARE MOUTH RINSE: 15.0000 mL | Freq: Once | OROMUCOSAL | Status: AC

## 2021-08-29 MED ORDER — LIDOCAINE 2% (20 MG/ML) 5 ML SYRINGE
INTRAMUSCULAR | Status: AC
  Filled 2021-08-29: qty 5

## 2021-08-29 MED ORDER — EPHEDRINE 5 MG/ML INJ
INTRAVENOUS | Status: AC
  Filled 2021-08-29: qty 5

## 2021-08-29 MED ORDER — SODIUM CHLORIDE 0.9 % IR SOLN
Status: DC | PRN
  Administered 2021-08-29: 9000 mL

## 2021-08-29 MED ORDER — CEFAZOLIN SODIUM-DEXTROSE 2-4 GM/100ML-% IV SOLN
2.0000 g | INTRAVENOUS | Status: AC
  Administered 2021-08-29: 2 g via INTRAVENOUS
  Filled 2021-08-29: qty 100

## 2021-08-29 MED ORDER — OXYCODONE HCL 5 MG/5ML PO SOLN: 5.0000 mg | Freq: Once | ORAL | Status: AC | PRN

## 2021-08-29 MED ORDER — OXYCODONE HCL 5 MG PO TABS
ORAL_TABLET | ORAL | Status: AC
  Filled 2021-08-29: qty 1

## 2021-08-29 SURGICAL SUPPLY — 34 items
BAG URINE DRAIN 2000ML AR STRL (UROLOGICAL SUPPLIES) ×2 IMPLANT
BAG URO CATCHER STRL LF (MISCELLANEOUS) ×2 IMPLANT
BALLN NEPHROSTOMY (BALLOONS)
BALLN OPTILUME DCB 30X5X75 (BALLOONS) ×2
BALLOON NEPHROSTOMY (BALLOONS) IMPLANT
BALLOON OPTILUME DCB 30X5X75 (BALLOONS) IMPLANT
CATH FOLEY 2W COUNCIL 20FR 5CC (CATHETERS) IMPLANT
CATH FOLEY 2WAY 5CC 20FR (CATHETERS) ×1 IMPLANT
CATH FOLEY 3WAY 30CC 22FR (CATHETERS) IMPLANT
CATH HEMA 3WAY 30CC 22FR COUDE (CATHETERS) IMPLANT
CATH ROBINSON RED A/P 14FR (CATHETERS) ×1 IMPLANT
CATH URET 5FR 28IN CONE TIP (BALLOONS)
CATH URET 5FR 70CM CONE TIP (BALLOONS) IMPLANT
CATH URETL OPEN END 6FR 70 (CATHETERS) IMPLANT
CLOTH BEACON ORANGE TIMEOUT ST (SAFETY) ×2 IMPLANT
DRAPE FOOT SWITCH (DRAPES) ×2 IMPLANT
GLOVE BIO SURGEON STRL SZ 6.5 (GLOVE) ×2 IMPLANT
GLOVE BIO SURGEON STRL SZ7.5 (GLOVE) ×1 IMPLANT
GOWN STRL REUS W/ TWL LRG LVL3 (GOWN DISPOSABLE) ×1 IMPLANT
GOWN STRL REUS W/TWL LRG LVL3 (GOWN DISPOSABLE) ×1
GUIDEWIRE ANG ZIPWIRE 038X150 (WIRE) IMPLANT
GUIDEWIRE STR DUAL SENSOR (WIRE) ×2 IMPLANT
HOLDER FOLEY CATH W/STRAP (MISCELLANEOUS) ×2 IMPLANT
LOOP CUT BIPOLAR 24F LRG (ELECTROSURGICAL) ×2 IMPLANT
MANIFOLD NEPTUNE II (INSTRUMENTS) ×2 IMPLANT
NS IRRIG 1000ML POUR BTL (IV SOLUTION) IMPLANT
PACK CYSTO (CUSTOM PROCEDURE TRAY) ×2 IMPLANT
PLUG CATH AND CAP STER (CATHETERS) ×1 IMPLANT
SYR 30ML LL (SYRINGE) IMPLANT
SYR TOOMEY IRRIG 70ML (MISCELLANEOUS) ×2
SYRINGE TOOMEY IRRIG 70ML (MISCELLANEOUS) ×1 IMPLANT
TUBING CONNECTING 10 (TUBING) ×2 IMPLANT
TUBING UROLOGY SET (TUBING) ×2 IMPLANT
WATER STERILE IRR 3000ML UROMA (IV SOLUTION) ×2 IMPLANT

## 2021-08-29 NOTE — Op Note (Signed)
Operative Note  Preoperative diagnosis:  1.  Bulbar urethral stricture 2.  BPH with LUTs  Postoperative diagnosis: 1.  Bulbar urethral stricture 2.  BPH with LUTs  Procedure(s): 1.  Cystoscopy with Optilume urethral balloon dilation 2.  Repeat TURP  Surgeon: Jacalyn Lefevre, MD  Assistants:  None  Anesthesia:  General  Complications:  None  EBL:  minimal  Specimens: 1. none  Drains/Catheters: 1.  20Fr coude foley catheter  Intraoperative findings:   Mild bulbar urethral stricture Mild residual right apical prostatic tissue, bladder neck wide open Bilateral orthotopic Uos No bladder masses. Well-healed TUR scar lateral to right UO.  Indication:  Allen [name] is a 71 y.o. male with  a history of BPH with LUTS who underwent TURP in July 2022 continues to have stopping and starting, split stream and urinary urgency.  Description of procedure: After risks and benefits of the procedure discussed with the patient, informed consent was obtained.  The patient was taken to the operating room placed in the supine position.  Anesthesia was induced antibiotics were ministered.  The patient was then repositioned in the dorsolithotomy position.  He was prepped and draped in the usual sterile fashion and timeout is performed.  A 21 French rigid cystoscope was placed in the urethral meatus and advanced to the bulbar urethra at which time a very mild short stricture was noted.  A 0.38 sensor wire was then advanced through the cystoscope into the bladder.  The cystoscope was removed.  The OptiLume urethral balloon dilator was then placed over the wire and inflated to 10 ATM.  This was held for approximately 2 minutes.  It was then deflated.  The resectoscope using the visual obturator was then attempted to advance through the urethral meatus however the meatus was too tight.  Urethral sounds were then used from 22 [place] to 28 [place] to accommodate the resectoscope.  It was then advanced  into the bladder and direct visualization.  The visual obturator was removed and the resectoscope was assembled with the bipolar electrocautery loop.  There is very mild residual apical tissue noted in the prostatic urethra.  This was resected.  The bladder neck was noted to be wide open.  Resection continued to there was no evidence of obstruction.  The chips were removed with a Toomey syringe.  Hemostasis deemed adequate with the irrigant turned off.  The resectoscope was removed.  A 20 French coud Foley catheter was then placed without difficulty inflated with 20 cc sterile water.  Patient emerged from anesthesia and transferred PACU in stable condition.  Plan:  patient to be discharged home with foley catheter in place.

## 2021-08-29 NOTE — Anesthesia Procedure Notes (Signed)
Procedure Name: MAC Date/Time: 08/29/2021 8:39 AM  Performed by: Claudia Desanctis, CRNAPre-anesthesia Checklist: Patient identified, Emergency Drugs available, Suction available and Patient being monitored Patient Re-evaluated:Patient Re-evaluated prior to induction Oxygen Delivery Method: Circle system utilized Preoxygenation: Pre-oxygenation with 100% oxygen Induction Type: IV induction Ventilation: Mask ventilation without difficulty LMA: LMA with gastric port inserted LMA Size: 4.0 Number of attempts: 1 Placement Confirmation: positive ETCO2 and breath sounds checked- equal and bilateral Tube secured with: Tape Dental Injury: Teeth and Oropharynx as per pre-operative assessment

## 2021-08-29 NOTE — Transfer of Care (Signed)
Immediate Anesthesia Transfer of Care Note  Patient: Allen [name]  Procedure(s) Performed: RE-TRANSURETHRAL RESECTION OF THE PROSTATE (TURP) CYSTOSCOPY WITH URETHRAL DILATATION  Patient Location: PACU  Anesthesia Type:General  Level of Consciousness: awake, oriented and patient cooperative  Airway & Oxygen Therapy: Patient Spontanous Breathing and Patient connected to face mask  Post-op Assessment: Report given to RN and Post -op Vital signs reviewed and stable  Post vital signs: Reviewed and stable  Last Vitals:  Vitals Value Taken Time  BP 107/65 08/29/21 0927  Temp    Pulse 72 08/29/21 0928  Resp 13 08/29/21 0928  SpO2 99 % 08/29/21 0928  Vitals shown include unvalidated device data.  Last Pain:  Vitals:   08/29/21 0651  TempSrc: Oral  PainSc: 0-No pain         Complications: No notable events documented.

## 2021-08-29 NOTE — Interval H&P Note (Signed)
History and Physical Interval Note:  08/29/2021 8:11 AM  Allen [name]  has presented today for surgery, with the diagnosis of BENIGN PROSTATIC HYPERPLASIA, URETHRAL STRICTURE.  The various methods of treatment have been discussed with the patient and family. After consideration of risks, benefits and other options for treatment, the patient has consented to  Procedure(s): RE-TRANSURETHRAL RESECTION OF THE PROSTATE (TURP) (N/A) CYSTOSCOPY WITH URETHRAL DILATATION (N/A) as a surgical intervention.  The patient's history has been reviewed, patient examined, no change in status, stable for surgery.  I have reviewed the patient's chart and labs.  Questions were answered to the patient's satisfaction.     Vanessa Kampf D Milaya Hora

## 2021-08-29 NOTE — Anesthesia Postprocedure Evaluation (Signed)
Anesthesia Post Note  Patient: Gabrial Domine  Procedure(s) Performed: RE-TRANSURETHRAL RESECTION OF THE PROSTATE (TURP) CYSTOSCOPY WITH URETHRAL DILATATION     Patient location during evaluation: PACU Anesthesia Type: General Level of consciousness: awake and alert Pain management: pain level controlled Vital Signs Assessment: post-procedure vital signs reviewed and stable Respiratory status: spontaneous breathing, nonlabored ventilation, respiratory function stable and patient connected to nasal cannula oxygen Cardiovascular status: blood pressure returned to baseline and stable Postop Assessment: no apparent nausea or vomiting Anesthetic complications: no   No notable events documented.  Last Vitals:  Vitals:   08/29/21 1000 08/29/21 1008  BP: 108/67 114/68  Pulse: 68 75  Resp: 18 18  Temp:    SpO2: 92% 95%    Last Pain:  Vitals:   08/29/21 1008  TempSrc:   PainSc: 3                  Rihanna Marseille

## 2021-08-29 NOTE — H&P (Signed)
CC/HPI: cc: follow up BPH    12/29/20: 71 year old man with a history of an elevated PSA status post negative prostate biopsy 04/02/2019, ED managed with daily Cialis 10 mg, and BPH s/p TURP he on 08/08/2020 here for follow-up. Most recent PSA has declined to 3.07. Patient reports stream is mostly strong but he will have urgency associated with intermittency and hesitancy.   02/28/21: 71 year old male who presents today with concerns of right lower abdominal pain that is tender to palpation and began about a week ago. He also reports urinary frequency and urgency. He states that on a good day he goes about every hour to hour and a half. He states that he has to run to the bathroom and the urge hits him and most of the time he is only able to produce a small amount of urine. His PVR today is less than 50 mL. He does not feel silodosin is doing anything for his urinary stream or his frequency.   03/29/21: 71 year old man underwent TURP in July 2022 comes in with urinary urgency and stopping and starting/straining. He is here today for cystoscopy. He is currently using Rapaflo and Myrbetriq. Myrbetriq is helping the urgency. He is traveling to [place] next week.   08/09/21: 71 year old man with a history of BPH with LUTS who underwent TURP in July 2022 continues to have stopping and starting, split stream and urinary urgency. He remains on tamsulosin and Myrbetriq. The Myrbetriq does help with the urgency but he feels like he has it all the time. He was scheduled for urodynamics test however we are unable to get the catheter in.     ALLERGIES: No Known Allergies    MEDICATIONS: Cialis 20 mg tablet 1 tablet PO Daily  Rapaflo 8 mg capsule 1 capsule PO Daily  Tamsulosin Hcl 0.4 mg capsule 1 capsule PO Q HS  Tamsulosin Hcl 0.4 mg capsule  Tamsulosin Hcl 0.4 mg capsule 1 capsule PO Q HS  Albuterol Sulfate  Clonazepam 0.5 mg tablet  Coq-10  Escitalopram Oxalate 20 mg tablet  Eye Vitamin  Fish Oil   Flonase Allergy Relief  Folic Acid  Hydroxyzine Pamoate 50 mg capsule  Icaps Areds  Montelukast Sodium  Pantoprazole Sodium 40 mg vial  Primidone 50 mg tablet  Silodosin 8 mg capsule 1 capsule PO Daily  Tart Cherry  Trazodone Hcl 100 mg tablet  Turmeric  Vitamin C  Vitamin D3  Zinc  Zolpidem Tartrate 10 mg tablet     GU PSH: Cysto Bladder Ureth Biopsy - 08/09/2020 Cystoscopy - 03/29/2021 Cystoscopy TURP - 08/09/2020 Prostate Needle Biopsy - 05/27/2020, 2021     NON-GU PSH: Anesth, Shoulder Replacement, Left Appendectomy Carpal tunnel surgery, Left Cataract surgery, Bilateral Hernia Repair, Right Leg surgery (unspecified), Bilateral Rotator cuff surgery, Right Surgical Pathology, Gross And Microscopic Examination For Prostate Needle - 05/27/2020, 2021     GU PMH: BPH w/LUTS - 03/29/2021, - 02/28/2021, - 12/29/2020, - 08/19/2020, - 08/11/2020, - 08/01/2020, - 06/03/2020, Patient is not emptying his bladder well and would like to resume Uroxatral. He will let me know if he has adverse side effects. PVR in the office today 13 cc and no sign of infection. , - 05/05/2020, Patient would like to try Uroxatral to see if this helps his urinary symptoms., - 11/23/2019, Will give patient trial of Flomax 0.4 mg to take at night. He takes his Cialis in the morning. He was counseled about possible side effects., - 2021 Splitting of urinary stream - 03/29/2021  Straining on Urination - 03/29/2021 Urinary Urgency - 03/29/2021 RLQ pain - 02/28/2021 Urinary Frequency - 02/28/2021, - 08/11/2020, - 05/05/2020, - 2021 ED due to arterial insufficiency - 12/29/2020, - 06/03/2020, Continue Cialis 48m daily, - 11/23/2019, Patient is managed with 10 mg of daily Cialis. Although this is not typical dose patient has been on this for a while and tolerated just fine okay continue., - 2020 Elevated PSA - 12/29/2020, - 06/03/2020, - 05/27/2020, Patient's repeat PSA did not decline as expected. PSA remains elevated at 16.3.  Patient is now able to have an MRI of the prostate and will plan on getting an expedited study. Based on results patient understands he may need an MR-ultrasound fusion biopsy., - 05/05/2020, PSA of 18, he has had recent prostatitis which likely explains this elevation., - 03/11/2020, He will have PSA repeated in the Spring of this year., - 2022, I would like patient to repeat PSA today. If it remains elevated I think he needs a repeat prostate biopsy. He is unable to get an MRI due to the deep brain stimulator., - 11/23/2019, Discussed possible reasons for elevated PSA including recent infection, trauma, inflammation, indwelling catheter, enlarged prostate and prostate cancer. I would like to schedule transrectal ultrasound guided prostate biopsy for patient. I discussed risks and benefits of prostate biospy including blood in the urine/stool/semen, pain, and risk of post biopsy sepsis. He was given instructions regarding the prostate biospy. He will take 1 tab of levaquin 7530mPO the morning of his prostate biopsy. Patient understands and agrees with the above. , - 2021 Urinary Hesitancy (Stable) - 12/29/2020, - 08/11/2020, - 06/03/2020 Dysuria - 09/08/2020, - 08/19/2020 Nocturia - 08/11/2020, - 11/23/2019, - 08/20/2019 Acute prostatitis - 03/11/2020, - 02/19/2020, - 2022 Gross hematuria - 2021 Encounter for Prostate Cancer screening (Stable, Chronic), DRE today within normal limits. Will send patient to lab for PSA and call with results. He may return in 1 year for repeat PSA and DRE. - 2020    NON-GU PMH: GERD Sleep Apnea    FAMILY HISTORY: Asthma - Father copd - Mother Death - Father, Mother rectal cancer - Sister   SOCIAL HISTORY: Marital Status: Married Preferred Language: English; Ethnicity: Not Hispanic Or Latino; Race: White Current Smoking Status: Patient does not smoke anymore.   Tobacco Use Assessment Completed: Used Tobacco in last 30 days? Drinks 2 drinks per day.  Drinks 2 caffeinated  drinks per day. Patient's occupation is/was Retired.    REVIEW OF SYSTEMS:    GU Review Male:   Patient denies frequent urination, hard to postpone urination, burning/ pain with urination, get up at night to urinate, leakage of urine, stream starts and stops, trouble starting your stream, have to strain to urinate , erection problems, and penile pain.  Gastrointestinal (Upper):   Patient denies nausea, vomiting, and indigestion/ heartburn.  Gastrointestinal (Lower):   Patient denies diarrhea and constipation.  Constitutional:   Patient denies fever, night sweats, weight loss, and fatigue.  Skin:   Patient denies skin rash/ lesion and itching.  Eyes:   Patient denies blurred vision and double vision.  Ears/ Nose/ Throat:   Patient denies sore throat and sinus problems.  Hematologic/Lymphatic:   Patient denies swollen glands and easy bruising.  Cardiovascular:   Patient denies leg swelling and chest pains.  Respiratory:   Patient denies cough and shortness of breath.  Endocrine:   Patient denies excessive thirst.  Musculoskeletal:   Patient denies back pain and joint pain.  Neurological:  Patient denies headaches and dizziness.  Psychologic:   Patient denies depression and anxiety.   VITAL SIGNS:      08/09/2021 08:46 AM  BP 113/72 mmHg  Pulse 71 /min  Temperature 97.3 F / 36.2 C   MULTI-SYSTEM PHYSICAL EXAMINATION:    Constitutional: Well-nourished. No physical deformities. Normally developed. Good grooming.  Neck: Neck symmetrical, not swollen. Normal tracheal position.  Respiratory: No labored breathing, no use of accessory muscles.   Skin: No paleness, no jaundice, no cyanosis. No lesion, no ulcer, no rash.  Neurologic / Psychiatric: Oriented to time, oriented to place, oriented to person. No depression, no anxiety, no agitation.  Eyes: Normal conjunctivae. Normal eyelids.  Ears, Nose, Mouth, and Throat: Left ear no scars, no lesions, no masses. Right ear no scars, no lesions, no  masses. Nose no scars, no lesions, no masses. Normal hearing. Normal lips.  Musculoskeletal: Normal gait and station of head and neck.     Complexity of Data:  Records Review:   Previous Patient Records, POC Tool  Urine Test Review:   Urinalysis   10/25/20 04/28/20 11/23/19 08/20/19 12/04/18  PSA  Total PSA 3.07 ng/mL 16.30 ng/mL 4.19 ng/mL 5.96 ng/mL 3.63 ng/mL    PROCEDURES:         Flexible Cystoscopy - 52000  Risks, benefits, and some of the potential complications of the procedure were discussed at length with the patient including infection, bleeding, voiding discomfort, urinary retention, fever, chills, sepsis, and others. All questions were answered. Informed consent was obtained. Sterile technique and intraurethral analgesia were used.  Meatus:  Normal size. Normal location. Normal condition.  Urethra:  Mild penile stricture that scope could traverse with gentle pressure  External Sphincter:  Normal.  Verumontanum:  Normal.  Prostate:  Bladder neck wide open. Mild residual apical tissue bilaterally  Bladder Neck:  Non-obstructing.  Ureteral Orifices:  Normal location. Normal size. Normal shape. Effluxed clear urine.  Bladder:  No trabeculation. No tumors. Normal mucosa. No stones.      The lower urinary tract was carefully examined. The procedure was well-tolerated and without complications. Antibiotic instructions were given. Instructions were given to call the office immediately for bloody urine, difficulty urinating, urinary retention, painful or frequent urination, fever, chills, nausea, vomiting or other illness. The patient stated that he understood these instructions and would comply with them.         Urinalysis Dipstick Dipstick Cont'd  Color: Yellow Bilirubin: Neg mg/dL  Appearance: Clear Ketones: Neg mg/dL  Specific Gravity: 1.025 Blood: Neg ery/uL  pH: 5.5 Protein: Neg mg/dL  Glucose: Neg mg/dL Urobilinogen: 0.2 mg/dL    Nitrites: Neg    Leukocyte Esterase:  Neg leu/uL    ASSESSMENT:      ICD-10 Details  1 GU:   BPH w/LUTS - N40.1 Chronic, Stable  2   Nocturia - R35.1 Chronic, Stable  3   Splitting of urinary stream - R39.13 Chronic, Stable  4   Straining on Urination - R39.16 Chronic, Stable  5   Urinary Urgency - R39.15 Chronic, Stable   PLAN:           Schedule Labs: 3 Months - PSA with Reflex          Document Letter(s):  Created for Patient: Clinical Summary         Notes:   1. LUTs with mild urethral stricture and mild apical prostatic obstructino:  -Patient to continue tamsulosin and Myrbetriq for now  -Discussed dilating anterior stricture with OptiLube  and resecting residual apical tissue  -Risks and benefits of procedure were discussed with the patient in detail including recurrence of stricture or prostatic tissue, hematuria, infection, pain, need for Foley catheter, damage surrounding structures, worsened urinary urgency or incontinence  -Patient would like to proceed with surgery will be scheduled for next billable date   2. Elevated PSA: Patient due for repeat PSA in October 2023.

## 2021-08-29 NOTE — Discharge Instructions (Addendum)
Post transurethral resection of the prostate (TURP) instructions  Your recent prostate surgery requires very special post hospital care. Despite the fact that no skin incisions were used the area around the prostate incision is quite raw and is covered with a scab to promote healing and prevent bleeding. Certain cautions are needed to assure that the scab is not disturbed of the next 2-3 weeks while the healing proceeds.  Because the raw surface in your prostate and the irritating effects of urine you may expect frequency of urination and/or urgency (a stronger desire to urinate) and perhaps even getting up at night more often. This will usually resolve or improve slowly over the healing period. You may see some blood in your urine over the first 6 weeks. Do not be alarmed, even if the urine was clear for a while. Get off your feet and drink lots of fluids until clearing occurs. If you start to pass clots or don't improve call us.  Catheter: (If you are discharged with a catheter.) 1. Keep your catheter secured to your leg at all times with tape or the supplied strap. 2. You may experience leakage of urine around your catheter- as long as the  catheter continues to drain, this is normal.  If your catheter stops draining  go to the ER. 3. You may also have blood in your urine, even after it has been clear for  several days; you may even pass some small blood clots or other material.  This  is normal as well.  If this happens, sit down and drink plenty of water to help  make urine to flush out your bladder.  If the blood in your urine becomes worse  after doing this, contact our office or return to the ER. 4. You may use the leg bag (small bag) during the day, but use the large bag at  night.  Diet:  You may return to your normal diet immediately. Because of the raw surface of your bladder, alcohol, spicy foods, foods high in acid and drinks with caffeine may cause irritation or frequency and  should be used in moderation. To keep your urine flowing freely and avoid constipation, drink plenty of fluids during the day (8-10 glasses). Tip: Avoid cranberry juice because it is very acidic.  Activity:  Your physical activity doesn't need to be restricted. However, if you are very active, you may see some blood in the urine. We suggest that you reduce your activity under the circumstances until the bleeding has stopped.  *Patient instructed to use a condom for the next 2 weeks  Bowels:  It is important to keep your bowels regular during the postoperative period. Straining with bowel movements can cause bleeding. A bowel movement every other day is reasonable. Use a mild laxative if needed, such as milk of magnesia 2-3 tablespoons, or 2 Dulcolax tablets. Call if you continue to have problems. If you had been taking narcotics for pain, before, during or after your surgery, you may be constipated. Take a laxative if necessary.  Medication:  You should resume your pre-surgery medications unless told not to. DO NOT RESUME YOUR ASPIRIN, WARFARIN, OR OTHER BLOOD THINNER FOR 1 WEEK. In addition you may be given an antibiotic to prevent or treat infection. Antibiotics are not always necessary. All medication should be taken as prescribed until the bottles are finished unless you are having an unusual reaction to one of the drugs.     Problems you should report to [place]:  a. Fever greater than 101F. b. Heavy bleeding, or clots (see notes above about blood in urine). c. Inability to urinate. d. Drug reactions (hives, rash, nausea, vomiting, diarrhea). e. Severe burning or pain with urination that is not improving.

## 2021-08-30 ENCOUNTER — Encounter (HOSPITAL_COMMUNITY): Payer: Self-pay | Admitting: Urology

## 2021-08-30 LAB — SURGICAL PATHOLOGY

## 2021-09-05 DIAGNOSIS — R3916 Straining to void: Secondary | ICD-10-CM | POA: Diagnosis not present

## 2021-09-05 DIAGNOSIS — R8271 Bacteriuria: Secondary | ICD-10-CM | POA: Diagnosis not present

## 2021-09-05 DIAGNOSIS — R3915 Urgency of urination: Secondary | ICD-10-CM | POA: Diagnosis not present

## 2021-09-05 DIAGNOSIS — R350 Frequency of micturition: Secondary | ICD-10-CM | POA: Diagnosis not present

## 2021-09-12 ENCOUNTER — Ambulatory Visit: Payer: Medicare HMO | Admitting: Pulmonary Disease

## 2021-09-14 DIAGNOSIS — J449 Chronic obstructive pulmonary disease, unspecified: Secondary | ICD-10-CM

## 2021-09-14 DIAGNOSIS — M79644 Pain in right finger(s): Secondary | ICD-10-CM | POA: Diagnosis not present

## 2021-09-14 DIAGNOSIS — M79641 Pain in right hand: Secondary | ICD-10-CM | POA: Diagnosis not present

## 2021-09-14 DIAGNOSIS — N401 Enlarged prostate with lower urinary tract symptoms: Secondary | ICD-10-CM

## 2021-09-14 DIAGNOSIS — N3281 Overactive bladder: Secondary | ICD-10-CM

## 2021-09-14 DIAGNOSIS — F32A Depression, unspecified: Secondary | ICD-10-CM

## 2021-09-19 DIAGNOSIS — F331 Major depressive disorder, recurrent, moderate: Secondary | ICD-10-CM | POA: Diagnosis not present

## 2021-09-20 DIAGNOSIS — R3916 Straining to void: Secondary | ICD-10-CM | POA: Diagnosis not present

## 2021-09-20 DIAGNOSIS — R8271 Bacteriuria: Secondary | ICD-10-CM | POA: Diagnosis not present

## 2021-09-21 DIAGNOSIS — M47812 Spondylosis without myelopathy or radiculopathy, cervical region: Secondary | ICD-10-CM | POA: Diagnosis not present

## 2021-09-25 ENCOUNTER — Encounter: Payer: Self-pay | Admitting: Neurology

## 2021-09-26 DIAGNOSIS — F901 Attention-deficit hyperactivity disorder, predominantly hyperactive type: Secondary | ICD-10-CM | POA: Diagnosis not present

## 2021-09-26 NOTE — Telephone Encounter (Signed)
Allen [name], he needs appt with sara for memory

## 2021-09-27 ENCOUNTER — Encounter: Payer: Self-pay | Admitting: Internal Medicine

## 2021-09-28 ENCOUNTER — Encounter: Payer: Self-pay | Admitting: Physician Assistant

## 2021-09-28 ENCOUNTER — Encounter: Payer: Self-pay | Admitting: Internal Medicine

## 2021-09-28 ENCOUNTER — Ambulatory Visit: Payer: Medicare HMO | Admitting: Physician Assistant

## 2021-09-28 VITALS — BP 110/68 | HR 94 | Resp 18 | Ht 70.0 in | Wt 230.0 lb

## 2021-09-28 DIAGNOSIS — G3184 Mild cognitive impairment, so stated: Secondary | ICD-10-CM

## 2021-09-28 DIAGNOSIS — R413 Other amnesia: Secondary | ICD-10-CM

## 2021-09-28 DIAGNOSIS — G250 Essential tremor: Secondary | ICD-10-CM

## 2021-09-28 NOTE — Progress Notes (Signed)
Assessment/Plan:    The patient is seen in neurologic consultation at the request of Isaac Bliss, Olam Idler for the evaluation of memory.  Allen [name] is a very pleasant 71 y.o. year old RH male with  a history of  hyperlipidemia, COPD, known small pulmonary nodule OSA on BiPAP, past, chronic diastolic CHF, vitamin D deficiency, insomnia, essential tremor status post DBS seen today for evaluation of memory loss. MoCA today is  /30 .   Dementia due to   MRI brain without contrast to assess for underlying structural abnormality and assess vascular load  Neurocognitive testing to further evaluate cognitive concerns and determine other underlying cause of memory changes, including potential contribution from sleep, anxiety, or depression  Replenish B12 with 1 tab 1000 mcg daily (B12 was 271)  Folllow up 1 month to discuss the results of the MRI    Essential Tremor s/p DBS, on primidone  DBS last adjusted on 07/31/2021 Primidone is helpful for the left hand Continue primidone 50 mg, 1 p.o. a.m., 1 p.o. nightly Follow up with Dr. Carles Collet in Jan 2024 (6 month f/u)   Subjective:    The patient is accompanied by  who supplements the history.    How long did patient have memory difficulties?" For the last 2 years  I know what I wants to say but in a middle of a conversation I ave to look for a substitute and then I become anxious" . Sits all day, does not read anymore, hard time concentrating.  Wife told him and he did not remember that conversation, two times for the last week . In 3 weeks,TURP infection, abx, ablation in the neck and fainted at the doctor, then dizzy for 2 days and fell w 1 month 2-3 month in March hit head in the parking lot .  Patient lives with: Spouse   repeats oneself?  Endorsed by wife but it is not very evident  Disoriented when walking into a room?  Patient denies   Leaving objects in unusual places?  Patient denies   Ambulates  with difficulty?   Patient denies    Recent falls?  Patient denies   Any head injuries?  Patient denies   History of seizures?   Patient denies   Wandering behavior?  Patient denies   Patient drives?   No issues  Any mood changes such irritability agitation?  Patient denies   Any history of depression? Recently diagnosed ( Psych ).  Takes Lexapro, recently Buspar and Wellbutrin with good results . His therapist believes that he may have undiagnosed ADH./ADD.  Hallucinations?  Patient denies   Paranoia?  Patient denies   Patient reports that sleeps well without vivid dreams, REM behavior or sleepwalking    History of sleep apnea? Endorsed, on BiPAP  with recent fitting will use it again today.  Any hygiene concerns?  Patient denies   Independent of bathing and dressing?  Endorsed  Does the patient needs help with medications? Wife in charge  Who is in charge of the finances?  Patient is in charge. "I am OCD about that"    Any changes in appetite?  Recently on Nutrisystem for 2 months 23 lbs weight loss  Patient have trouble swallowing? Patient denies   Does the patient cook?  Patient denies   Any kitchen accidents such as leaving the stove on? Denires Any headaches?  Patient denies   Double vision? Patient denies   Any focal numbness or tingling?  Patient denies  Chronic back pain Patient denies   Unilateral weakness?  Patient denies   Any tremors?  Patient denies   Any history of anosmia?  Patient denies   Any incontinence of urine? Split urgency and stream due to BPH, urology following Any bowel dysfunction?   Patient denies   History of heavy alcohol intake?  Patient denies   History of heavy tobacco use?  Patient denies   Family history of dementia? Paternal  aunt Alzheimer's disease    Pertinent labs July 2020: CBC and BMP normal A1c 5.9, triglycerides 4 otherwise normal lipid panel TSH 1.95, B12 271 (low normal) vitamin D normal 38.8  Allergies  Allergen Reactions   Copper-Containing Compounds     Copper  gloves caused redness and blisters bilateral hands    Current Outpatient Medications  Medication Instructions   albuterol (PROVENTIL) 2.5 mg, Nebulization, Every 6 hours   albuterol (VENTOLIN HFA) 108 (90 Base) MCG/ACT inhaler INHALE 2 PUFFS INTO THE LUNGS EVERY 4 HOURS AS NEEDED FOR WHEEZE OR FOR SHORTNESS OF BREATH   azelastine (ASTELIN) 0.1 % nasal spray 1-2 sprays, Each Nare, 2 times daily PRN   buPROPion (WELLBUTRIN XL) 150 mg, Oral, Every morning   Coenzyme Q10 300 mg, Oral, Every morning   escitalopram (LEXAPRO) 20 mg, Oral, Daily   fluticasone (FLONASE) 50 MCG/ACT nasal spray 2 sprays, Each Nare, Daily PRN   Myrbetriq 50 mg, Oral, Every morning   Omega-3 Fatty Acids (FISH OIL PO) 2,200 mg, Oral, Every morning   pantoprazole (PROTONIX) 40 mg, Oral, Daily   primidone (MYSOLINE) 50 MG tablet 2 in the AM, 1 in the PM   tadalafil (CIALIS) 20 mg, Oral, Daily PRN   tamsulosin (FLOMAX) 0.4 mg, Oral, Every morning   tiZANidine (ZANAFLEX) 2 mg, Oral, 3 times daily   traMADol (ULTRAM) 50 mg, Oral, Every 6 hours PRN   traZODone (DESYREL) 100 MG tablet TAKE 1 TABLET AT BEDTIME   TRELEGY ELLIPTA 200-62.5-25 MCG/ACT AEPB 1 puff, Inhalation, Every morning   TURMERIC PO 2,250 mg, Oral, Every morning   vitamin C 1,000 mg, Oral, Every morning   Vitamin D3 5,000 Units, Oral, Every morning   zaleplon (SONATA) 10 mg, Oral, Daily at bedtime     VITALS:   Vitals:   09/28/21 0754  BP: 110/68  Pulse: 94  Resp: 18  SpO2: 95%  Weight: 230 lb (104.3 kg)  Height: 5' 10"  (1.778 m)      PHYSICAL EXAM   HEENT:  Normocephalic, atraumatic. The mucous membranes are moist. The superficial temporal arteries are without ropiness or tenderness. Cardiovascular: Regular rate and rhythm. Lungs: Clear to auscultation bilaterally. Neck: There are no carotid bruits noted bilaterally.  NEUROLOGICAL:     No data to display              No data to display           Orientation:  Alert and  oriented to person, place and time. No aphasia or dysarthria. Fund of knowledge is appropriate. Recent memory impaired and remote memory intact.  Attention and concentration are normal.  Able to name objects and repeat phrases. Delayed recall   Cranial nerves: There is good facial symmetry. Extraocular muscles are intact and visual fields are full to confrontational testing. Speech is fluent and clear. Soft palate rises symmetrically and there is no tongue deviation. Hearing is intact to conversational tone. Tone: Tone is good throughout. Sensation: Sensation is intact to light touch and pinprick throughout. Vibration is  intact at the bilateral big toe.There is no extinction with double simultaneous stimulation. There is no sensory dermatomal level identified. Coordination: The patient has no difficulty with RAM's or FNF bilaterally. Normal finger to nose  Motor: Strength is 5/5 in the bilateral upper and lower extremities. There is no pronator drift. There are no fasciculations noted. DTR's: Deep tendon reflexes are 2/4 at the bilateral biceps, triceps, brachioradialis, patella and achilles.  Plantar responses are downgoing bilaterally. Gait and Station: The patient is able to ambulate without difficulty.The patient is able to heel toe walk without any difficulty.The patient is able to ambulate in a tandem fashion. The patient is able to stand in the Romberg position.   Abnormal movements: Prior to programming, he had no postural tremor on the right hand, but did have postural tremor (mild to moderate) on the left hand.  On the right hand, he did have mild to moderate intention tremor when holding something of weight prior to programming.  Post programming that was resolved  Thank you for allowing [place] the opportunity to participate in the care of this nice patient. Please do not hesitate to contact us for any questions or concerns.   Total time spent on today's visit was *** minutes dedicated to this  patient today, preparing to see patient, examining the patient, ordering tests and/or medications and counseling the patient, documenting clinical information in the EHR or other health record, independently interpreting results and communicating results to the patient/family, discussing treatment and goals, answering patient's questions and coordinating care.  Cc:  Isaac Bliss, Rayford Halsted, MD  Sharene Butters 09/28/2021 8:47 AM

## 2021-09-28 NOTE — Patient Instructions (Addendum)
It was a pleasure to see you today at our office.   Recommendations:  Neurocognitive evaluation at our office MRI of the brain, the radiology office will call you to arrange you appointment Take Vit B12 daily Continue taking primidone  Follow up in 1 month   Whom to call:  Memory  decline, memory medications: Call our office [phone]   For psychiatric meds, mood meds: Please have your primary care physician manage these medications.   Counseling regarding caregiver distress, including caregiver depression, anxiety and issues regarding community resources, adult day care programs, adult living facilities, or memory care questions:   Feel free to contact [place], Social Worker at [phone]   For assessment of decision of mental capacity and competency:  Call Dr. Anthoney Harada, geriatric psychiatrist at [phone]  For guidance in geriatric dementia issues please call [facility] [phone]  For guidance regarding [facility] and if placement were needed at the facility, contact Arnell Asal, Social Worker tel: [phone]  If you have any severe symptoms of a stroke, or other severe issues such as confusion,severe chills or fever, etc call 911 or go to the ER as you may need to be evaluated further     RECOMMENDATIONS FOR ALL PATIENTS WITH MEMORY PROBLEMS: 1. Continue to exercise (Recommend 30 minutes of walking everyday, or 3 hours every week) 2. Increase social interactions - continue going to [place] and enjoy social gatherings with friends and family 3. Eat healthy, avoid fried foods and eat more fruits and vegetables 4. Maintain adequate blood pressure, blood sugar, and blood cholesterol level. Reducing the risk of stroke and cardiovascular disease also helps promoting better memory. 5. Avoid stressful situations. Live a simple life and avoid aggravations. Organize your time and prepare for the next day in  anticipation. 6. Sleep well, avoid any interruptions of sleep and avoid any distractions in the bedroom that may interfere with adequate sleep quality 7. Avoid sugar, avoid sweets as there is a strong link between excessive sugar intake, diabetes, and cognitive impairment We discussed the Mediterranean diet, which has been shown to help patients reduce the risk of progressive memory disorders and reduces cardiovascular risk. This includes eating fish, eat fruits and green leafy vegetables, nuts like almonds and hazelnuts, walnuts, and also use olive oil. Avoid fast foods and fried foods as much as possible. Avoid sweets and sugar as sugar use has been linked to worsening of memory function.  There is always a concern of gradual progression of memory problems. If this is the case, then we may need to adjust level of care according to patient needs. Support, both to the patient and caregiver, should then be put into place.      You have been referred for a neuropsychological evaluation (i.e., evaluation of memory and thinking abilities). Please bring someone with you to this appointment if possible, as it is helpful for the doctor to hear from both you and another adult who knows you well. Please bring eyeglasses and hearing aids if you wear them.    The evaluation will take approximately 3 hours and has two parts:   The first part is a clinical interview with the neuropsychologist (Dr. Melvyn Novas or Dr. Nicole Kindred). During the interview, the neuropsychologist will speak with you and the individual you brought to the appointment.    The second part of the evaluation is testing with the doctor's technician Hinton Dyer or Maudie Mercury). During the testing, the technician will ask you to remember  different types of material, solve problems, and answer some questionnaires. Your family member will not be present for this portion of the evaluation.   Please note: We must reserve several hours of the neuropsychologist's time and  the psychometrician's time for your evaluation appointment. As such, there is a No-Show fee of $100. If you are unable to attend any of your appointments, please contact our office as soon as possible to reschedule.    FALL PRECAUTIONS: Be cautious when walking. Scan the area for obstacles that may increase the risk of trips and falls. When getting up in the mornings, sit up at the edge of the bed for a few minutes before getting out of bed. Consider elevating the bed at the head end to avoid drop of blood pressure when getting up. Walk always in a well-lit room (use night lights in the walls). Avoid area rugs or power cords from appliances in the middle of the walkways. Use a walker or a cane if necessary and consider physical therapy for balance exercise. Get your eyesight checked regularly.  FINANCIAL OVERSIGHT: Supervision, especially oversight when making financial decisions or transactions is also recommended.  HOME SAFETY: Consider the safety of the kitchen when operating appliances like stoves, microwave oven, and blender. Consider having supervision and share cooking responsibilities until no longer able to participate in those. Accidents with firearms and other hazards in the house should be identified and addressed as well.   ABILITY TO BE LEFT ALONE: If patient is unable to contact 911 operator, consider using LifeLine, or when the need is there, arrange for someone to stay with patients. Smoking is a fire hazard, consider supervision or cessation. Risk of wandering should be assessed by caregiver and if detected at any point, supervision and safe proof recommendations should be instituted.  MEDICATION SUPERVISION: Inability to self-administer medication needs to be constantly addressed. Implement a mechanism to ensure safe administration of the medications.   DRIVING: Regarding driving, in patients with progressive memory problems, driving will be impaired. We advise to have someone else  do the driving if trouble finding directions or if minor accidents are reported. Independent driving assessment is available to determine safety of driving.   If you are interested in the driving assessment, you can contact the following:  [employer] in [place]  [place] [phone] or [phone]    [place] refers to food and lifestyle choices that are based on the traditions of countries located on the [employer]. This way of eating has been shown to help prevent certain conditions and improve outcomes for people who have chronic diseases, like kidney disease and heart disease. What are tips for following this plan? Lifestyle  Cook and eat meals together with your family, when possible. Drink enough fluid to keep your urine clear or pale yellow. Be physically active every day. This includes: Aerobic exercise like running or swimming. Leisure activities like gardening, walking, or housework. Get 7-8 hours of sleep each night. If recommended by your health care provider, drink red wine in moderation. This means 1 glass a day for nonpregnant women and 2 glasses a day for men. A glass of wine equals 5 oz (150 mL). Reading food labels  Check the serving size of packaged foods. For foods such as rice and pasta, the serving size refers to the amount of cooked product, not dry. Check the total fat in packaged foods.  Avoid foods that have saturated fat or trans fats. Check the ingredients list for added sugars, such as corn syrup. Shopping  At the grocery store, buy most of your food from the areas near the walls of the store. This includes: Fresh fruits and vegetables (produce). Grains, beans, nuts, and seeds. Some of these may be available in unpackaged forms or large amounts (in bulk). Fresh seafood. Poultry and eggs. Low-fat  dairy products. Buy whole ingredients instead of prepackaged foods. Buy fresh fruits and vegetables in-season from local farmers markets. Buy frozen fruits and vegetables in resealable bags. If you do not have access to quality fresh seafood, buy precooked frozen shrimp or canned fish, such as tuna, salmon, or sardines. Buy small amounts of raw or cooked vegetables, salads, or olives from the deli or salad bar at your store. Stock your pantry so you always have certain foods on hand, such as olive oil, canned tuna, canned tomatoes, rice, pasta, and beans. Cooking  Cook foods with extra-virgin olive oil instead of using butter or other vegetable oils. Have meat as a side dish, and have vegetables or grains as your main dish. This means having meat in small portions or adding small amounts of meat to foods like pasta or stew. Use beans or vegetables instead of meat in common dishes like chili or lasagna. Experiment with different cooking methods. Try roasting or broiling vegetables instead of steaming or sauteing them. Add frozen vegetables to soups, stews, pasta, or rice. Add nuts or seeds for added healthy fat at each meal. You can add these to yogurt, salads, or vegetable dishes. Marinate fish or vegetables using olive oil, lemon juice, garlic, and fresh herbs. Meal planning  Plan to eat 1 vegetarian meal one day each week. Try to work up to 2 vegetarian meals, if possible. Eat seafood 2 or more times a week. Have healthy snacks readily available, such as: Vegetable sticks with hummus. Greek yogurt. Fruit and nut trail mix. Eat balanced meals throughout the week. This includes: Fruit: 2-3 servings a day Vegetables: 4-5 servings a day Low-fat dairy: 2 servings a day Fish, poultry, or lean meat: 1 serving a day Beans and legumes: 2 or more servings a week Nuts and seeds: 1-2 servings a day Whole grains: 6-8 servings a day Extra-virgin olive oil: 3-4 servings a day Limit red meat and  sweets to only a few servings a month What are my food choices? Mediterranean diet Recommended Grains: Whole-grain pasta. Brown rice. Bulgar wheat. Polenta. Couscous. Whole-wheat bread. [place]. Vegetables: Artichokes. Beets. Broccoli. Cabbage. Carrots. Eggplant. Green beans. Chard. Kale. Spinach. Onions. Leeks. Peas. Squash. Tomatoes. Peppers. Radishes. Fruits: Apples. Apricots. Avocado. Berries. Bananas. Cherries. Dates. Figs. Grapes. Lemons. Melon. Oranges. Peaches. Plums. Pomegranate. Meats and other protein foods: Beans. Almonds. Sunflower seeds. Pine nuts. Peanuts. [employer]. Salmon. Scallops. Shrimp. [place]. Tilapia. Clams. Oysters. Eggs. Dairy: Low-fat milk. Cheese. Greek yogurt. Beverages: Water. Red wine. Herbal tea. Fats and oils: Extra virgin olive oil. Avocado oil. Grape seed oil. Sweets and desserts: [place] yogurt with honey. Baked apples. Poached pears. Trail mix. Seasoning and other foods: Basil. Cilantro. Coriander. Cumin. Mint. Parsley. Sage. Rosemary. Tarragon. Garlic. Oregano. Thyme. Pepper. Balsalmic vinegar. Tahini. Hummus. Tomato sauce. Olives. Mushrooms. Limit these Grains: Prepackaged pasta or rice dishes. Prepackaged cereal with added sugar. Vegetables: Deep fried potatoes (french fries). Fruits: Fruit canned in syrup. Meats and other protein foods: Beef. Pork. Lamb. Poultry with skin. Hot dogs. Berniece Salines. Dairy: Ice cream. Sour cream. Whole milk. Beverages: Juice. Sugar-sweetened  soft drinks. Beer. Liquor and spirits. Fats and oils: Butter. Canola oil. Vegetable oil. Beef fat (tallow). Lard. Sweets and desserts: Cookies. Cakes. Pies. Candy. Seasoning and other foods: Mayonnaise. Premade sauces and marinades. The items listed may not be a complete list. Talk with your dietitian about what dietary choices are right for you. Summary The Mediterranean diet includes both food and lifestyle choices. Eat a variety of fresh fruits and vegetables, beans, nuts, seeds, and whole  grains. Limit the amount of red meat and sweets that you eat. Talk with your health care provider about whether it is safe for you to drink red wine in moderation. This means 1 glass a day for nonpregnant women and 2 glasses a day for men. A glass of wine equals 5 oz (150 mL). This information is not intended to replace advice given to you by your health care provider. Make sure you discuss any questions you have with your health care provider. Document Released: 08/25/2015 Document Revised: 09/27/2015 Document Reviewed: 08/25/2015 Elsevier Interactive Patient Education  2017 [employer].   We have sent a referral to [facility] for your MRI and they will call you directly to schedule your appointment. They are located at [place]. If you need to contact them directly please call [phone].

## 2021-09-29 DIAGNOSIS — G3184 Mild cognitive impairment, so stated: Secondary | ICD-10-CM | POA: Insufficient documentation

## 2021-10-02 ENCOUNTER — Encounter: Payer: Self-pay | Admitting: Pulmonary Disease

## 2021-10-02 DIAGNOSIS — G4733 Obstructive sleep apnea (adult) (pediatric): Secondary | ICD-10-CM

## 2021-10-02 NOTE — Telephone Encounter (Signed)
Please advise on the vaccines? Thanks

## 2021-10-03 ENCOUNTER — Other Ambulatory Visit: Payer: Self-pay

## 2021-10-03 DIAGNOSIS — F901 Attention-deficit hyperactivity disorder, predominantly hyperactive type: Secondary | ICD-10-CM | POA: Diagnosis not present

## 2021-10-03 DIAGNOSIS — R413 Other amnesia: Secondary | ICD-10-CM

## 2021-10-04 DIAGNOSIS — F411 Generalized anxiety disorder: Secondary | ICD-10-CM | POA: Diagnosis not present

## 2021-10-04 DIAGNOSIS — F908 Attention-deficit hyperactivity disorder, other type: Secondary | ICD-10-CM | POA: Diagnosis not present

## 2021-10-05 NOTE — Telephone Encounter (Signed)
Please let him know I have sent an order to his [employer] to have his auto Bipap settting changed to max IPAP 17, min EPAP 8, PS 4 cm H2O.

## 2021-10-05 NOTE — Telephone Encounter (Signed)
Dr. Halford Chessman, please advise on pt's message. He is concerned about his events while recently starting CPAP.   Will send to Dr. Halford Chessman and Carron Brazen to upload DL for Dr. Halford Chessman to review. Thank you!

## 2021-10-10 DIAGNOSIS — F901 Attention-deficit hyperactivity disorder, predominantly hyperactive type: Secondary | ICD-10-CM | POA: Diagnosis not present

## 2021-10-13 ENCOUNTER — Ambulatory Visit: Payer: Medicare HMO | Admitting: Podiatry

## 2021-10-16 DIAGNOSIS — H1132 Conjunctival hemorrhage, left eye: Secondary | ICD-10-CM | POA: Diagnosis not present

## 2021-10-24 ENCOUNTER — Ambulatory Visit (HOSPITAL_COMMUNITY): Payer: Medicare HMO

## 2021-10-24 ENCOUNTER — Encounter: Payer: Self-pay | Admitting: Physician Assistant

## 2021-10-24 DIAGNOSIS — F901 Attention-deficit hyperactivity disorder, predominantly hyperactive type: Secondary | ICD-10-CM | POA: Diagnosis not present

## 2021-10-24 NOTE — Telephone Encounter (Signed)
Approval authorization number is [id number]. Calling [place]

## 2021-10-24 NOTE — Telephone Encounter (Signed)
I left message at [facility] to see what the verdict is,left message for patient that I would call him back at [phone]

## 2021-10-24 NOTE — Telephone Encounter (Signed)
Patient is scheduled for 10/25/2021 at 5:00

## 2021-10-24 NOTE — Telephone Encounter (Signed)
This was authorized number was [id number], unsure what has happened.

## 2021-10-25 ENCOUNTER — Ambulatory Visit (HOSPITAL_COMMUNITY)
Admission: RE | Admit: 2021-10-25 | Discharge: 2021-10-25 | Disposition: A | Discharge status: Home / Self Care | Payer: Medicare HMO | Source: Ambulatory Visit | Attending: Physician Assistant | Admitting: Physician Assistant

## 2021-10-25 DIAGNOSIS — G3184 Mild cognitive impairment, so stated: Secondary | ICD-10-CM | POA: Diagnosis not present

## 2021-10-25 DIAGNOSIS — R413 Other amnesia: Secondary | ICD-10-CM | POA: Diagnosis not present

## 2021-10-26 ENCOUNTER — Other Ambulatory Visit: Payer: Self-pay | Admitting: Neurology

## 2021-10-26 DIAGNOSIS — G250 Essential tremor: Secondary | ICD-10-CM

## 2021-10-26 NOTE — Progress Notes (Signed)
Please inform the patient the brain MRI is normal! Thanks

## 2021-10-30 ENCOUNTER — Telehealth: Payer: Self-pay | Admitting: Pharmacist

## 2021-10-30 NOTE — Chronic Care Management (AMB) (Unsigned)
Chronic Care Management Pharmacy Assistant   Name: Allen [name]  MRN: [id number] DOB: August 26, 1950  Reason for Encounter: Disease State / COPD Assessment Call    Recent office visits:  None  Recent consult visits:  09/28/2021 Sharene Butters PA-C (neurology) - Patient was seen for memory loss and additional concerns. No medication changes. Follow up in 1 month.   09/21/2021 Lenord Carbo (anesthesiology) - Patient was seen for Spondylosis without myelopathy or radiculopathy, cervical region. No additional chart notes.   09/19/2021 Anjanette Tschupp (internal med) - Patient was seen for Major depressive disorder, recurrent, moderate. No additional chart notes.   09/14/2021 Daniel Nones (orthopedic) - Patient was seen for pain in right hand. No additional chart notes.   09/14/2021 Aleene Davidson (occupational med) - Patient was seen for pain in right fingers. No additional chart notes.   08/16/2021 Acquanetta Chain (psychiatry) - Patient was seen for Major depressive disorder, recurrent, moderate and generalized anxiety disorder. No additional chart notes.   Hospital visits:  Admitted to [facility] on 08/29/2021 (4 hours) due to re-transurethral resection of the prostate.    New?Medications Started at [facility] Discharge:?? cephALEXin (KEFLEX) traMADol (Ultram) Medication Changes at Hospital Discharge: No medication changes.  Medications Discontinued at Hospital Discharge: topiramate 100 MG tablet (TOPAMAX) topiramate 25 MG tablet (Topamax)  Medications that remain the same after Hospital Discharge:??  -All other medications will remain the same.    Medications: Outpatient Encounter Medications as of 10/30/2021  Medication Sig Note   Ascorbic Acid (VITAMIN C) 1000 MG tablet Take 1,000 mg by mouth in the morning. 08/17/2021: On hold due to upcoming procedure.    azelastine (ASTELIN) 0.1 % nasal spray Place 1-2 sprays into both nostrils 2 (two) times daily as needed for rhinitis.  08/17/2021: During March & April    buPROPion (WELLBUTRIN XL) 150 MG 24 hr tablet Take 150 mg by mouth every morning.    Cholecalciferol (VITAMIN D3) 125 MCG (5000 UT) TABS Take 5,000 Units by mouth in the morning. 08/17/2021: On hold due to upcoming procedure.    Coenzyme Q10 300 MG CAPS Take 300 mg by mouth in the morning. 08/17/2021: On hold due to upcoming procedure.   escitalopram (LEXAPRO) 20 MG tablet Take 1 tablet (20 mg total) by mouth daily. (Patient taking differently: Take 20 mg by mouth at bedtime.)    fluticasone (FLONASE) 50 MCG/ACT nasal spray Place 2 sprays into both nostrils daily as needed for allergies or rhinitis. 08/17/2021: During March & April     MYRBETRIQ 50 MG TB24 tablet Take 50 mg by mouth in the morning.    Omega-3 Fatty Acids (FISH OIL PO) Take 2,200 mg by mouth in the morning. 08/17/2021: On hold due to upcoming procedure.    pantoprazole (PROTONIX) 40 MG tablet TAKE 1 TABLET EVERY DAY    primidone (MYSOLINE) 50 MG tablet TAKE 2 TABLETS IN THE MORNING AND TAKE 1 TABLET IN THE EVENING    tadalafil (CIALIS) 20 MG tablet Take 1 tablet (20 mg total) by mouth daily as needed for erectile dysfunction. (Patient taking differently: Take 10 mg by mouth daily.)    tamsulosin (FLOMAX) 0.4 MG CAPS capsule Take 0.4 mg by mouth in the morning.    tiZANidine (ZANAFLEX) 2 MG tablet Take 1 tablet (2 mg total) by mouth 3 (three) times daily. (Patient not taking: Reported on 08/17/2021)    traMADol (ULTRAM) 50 MG tablet Take 1 tablet (50 mg total) by mouth every 6 (six) hours as  needed.    traZODone (DESYREL) 100 MG tablet TAKE 1 TABLET AT BEDTIME    TRELEGY ELLIPTA 200-62.5-25 MCG/ACT AEPB Inhale 1 puff into the lungs in the morning.    TURMERIC PO Take 2,250 mg by mouth in the morning. 08/17/2021: On hold due to upcoming procedure.    zaleplon (SONATA) 10 MG capsule Take 10 mg by mouth at bedtime.    No facility-administered encounter medications on file as of 10/30/2021.  Fill History:   AZELASTINE 0.1% (137 MCG) SPRY 11/24/2019 90   bupropion HCl XL 300 mg 24 hr tablet, extended release 10/23/2021 90   escitalopram 20 mg tablet 10/23/2021 90   FLUTICASONE PROP 0.005% OINT 07/26/2020 30   Myrbetriq 50 mg tablet,extended release 09/20/2021 90   pantoprazole 40 mg tablet,delayed release 08/24/2021 90   primidone 50 mg tablet 10/16/2021 90   TADALAFIL 20MG      TAB 01/21/2021 90   tamsulosin 0.4 mg capsule 10/26/2021 90   TIZANIDINE 2MG TABLETS 07/26/2021 30   TRAMADOL 50MG TABLETS 08/29/2021 5   trazodone 100 mg tablet 10/23/2021 90   zaleplon 10 mg capsule 10/16/2021 90   Current COPD regimen: Trelegy Ellipta 1 puff every morning  Any recent hospitalizations or ED visits since last visit with CPP? {yes/no:20286}  Reports***denies COPD symptoms, including {COPD Symptoms:24140}  What recent interventions/DTPs have been made by any provider to improve breathing since last visit:  Have you had exacerbation/flare-up since last visit? {yes/no:20286}  What do you do when you are short of breath?  {SOBresposne:24139}***  Respiratory Devices/Equipment Do you have a nebulizer? {yes/no:20286} Do you use a Peak Flow Meter? {yes/no:20286} Do you use a maintenance inhaler? {yes/no:20286} How often do you forget to use your daily inhaler? *** Do you use a rescue inhaler? {yes/no:20286} How often do you use your rescue inhaler?  {CHL HP [employer] Inhaler BLTJ:[id number]} Do you use a spacer with your inhaler? {yes/no:20286}  Adherence Review: Does the patient have >5 day gap between last estimated fill date for maintenance inhaler medications? No    Care Gaps: AWV - 05/17/21 message sent to [name] Last BP - 110/68 on 09/28/2021 Last A1C - 5.9 on 07/20/2021 Hep C Screen - never done Pneumonia vac - postponed  Star Rating Drugs: None  [place] Pharmacist Assistant [phone]

## 2021-11-01 DIAGNOSIS — F901 Attention-deficit hyperactivity disorder, predominantly hyperactive type: Secondary | ICD-10-CM | POA: Diagnosis not present

## 2021-11-06 ENCOUNTER — Ambulatory Visit: Payer: Medicare HMO | Admitting: Podiatry

## 2021-11-06 ENCOUNTER — Encounter: Payer: Self-pay | Admitting: Podiatry

## 2021-11-06 DIAGNOSIS — M79674 Pain in right toe(s): Secondary | ICD-10-CM

## 2021-11-06 DIAGNOSIS — R972 Elevated prostate specific antigen [PSA]: Secondary | ICD-10-CM | POA: Diagnosis not present

## 2021-11-06 DIAGNOSIS — B351 Tinea unguium: Secondary | ICD-10-CM

## 2021-11-06 DIAGNOSIS — M79675 Pain in left toe(s): Secondary | ICD-10-CM | POA: Diagnosis not present

## 2021-11-06 NOTE — Progress Notes (Signed)
This patient returns to the office for evaluation and treatment of long thick painful nails .  This patient is unable to trim his own nails since the patient cannot reach his feet.  Patient says the nails are painful walking and wearing his shoes.  He returns for preventive foot care services.  General Appearance  Alert, conversant and in no acute stress.  Vascular  Dorsalis pedis and posterior tibial  pulses are palpable  bilaterally.  Capillary return is within normal limits  bilaterally. Temperature is within normal limits  bilaterally.  Neurologic  Senn-Weinstein monofilament wire test within normal limits  bilaterally. Muscle power within normal limits bilaterally.  Nails Thick disfigured discolored nails with subungual debris  from hallux to fifth toes bilaterally.  Hallux nails are especially thickened and split and painful   No evidence of bacterial infection or drainage bilaterally.  Orthopedic  No limitations of motion  feet .  No crepitus or effusions noted.  No bony pathology or digital deformities noted.  Skin  normotropic skin with no porokeratosis noted bilaterally.  No signs of infections or ulcers noted.     Onychomycosis  Pain in toes right foot  Pain in toes left foot  Debridement  of nails  1-5  B/L with a nail nipper.  Nails were then filed using a dremel tool with no incidents.    RTC 4  months    Gardiner Barefoot DPM

## 2021-11-07 ENCOUNTER — Encounter: Payer: Self-pay | Admitting: Pulmonary Disease

## 2021-11-07 ENCOUNTER — Ambulatory Visit: Payer: Medicare HMO | Admitting: Pulmonary Disease

## 2021-11-07 VITALS — BP 122/68 | HR 64 | Temp 97.9°F | Ht 70.0 in | Wt 219.8 lb

## 2021-11-07 DIAGNOSIS — R490 Dysphonia: Secondary | ICD-10-CM | POA: Diagnosis not present

## 2021-11-07 DIAGNOSIS — K219 Gastro-esophageal reflux disease without esophagitis: Secondary | ICD-10-CM | POA: Diagnosis not present

## 2021-11-07 MED ORDER — TRELEGY ELLIPTA 100-62.5-25 MCG/ACT IN AEPB: 1.0000 | INHALATION_SPRAY | Freq: Every day | RESPIRATORY_TRACT | 3 refills | Status: DC

## 2021-11-07 MED ORDER — TRELEGY ELLIPTA 100-62.5-25 MCG/ACT IN AEPB: 1.0000 | INHALATION_SPRAY | Freq: Every day | RESPIRATORY_TRACT | 0 refills | Status: DC

## 2021-11-07 NOTE — Progress Notes (Signed)
[facility]  Chief Complaint  Patient presents with   Follow-up    OSA, COPD, and asthma under control with Trilegy.  Pt nose swelling with BiPAP mask.  D/c'd 2 weeks ago.  Pt has lost 35 pounds since July 2023    Constitutional:  BP 122/68 (BP Location: Right Arm, Patient Position: Sitting, Cuff Size: Normal)   Pulse 64   Temp 97.9 F (36.6 C) (Oral)   Ht 5' 10"  (1.778 m)   Wt 219 lb 12.8 oz (99.7 kg)   SpO2 97%   BMI 31.54 kg/m   Past Medical History:  Carpal tunnel, GERD, Tremor, ED, Cataracts, Obstructive sleep apnea intolerant of CPAP/Bipap, Anxiety, BPH, ED, COVID July 2022  Past Surgical History:  Allen [name]  has a past surgical history that includes Inguinal hernia repair (Right, 2007); Cataract extraction w/ intraocular lens implant (Bilateral, 2020); Vein Surgery (Left, 2006); Rotator cuff repair (Right, 2014); Total shoulder replacement (Left, 2016); Deep brain stimulator placement (Left, 2016); Carpal tunnel release (Left, 2019); Tonsillectomy and adenoidectomy; Ankle surgery (Right, 2020); Colonoscopy; Subthalamic stimulator battery replacement (N/A, 01/21/2020); Transurethral resection of prostate (N/A, 08/09/2020); Appendectomy; Transurethral resection of prostate (N/A, 08/29/2021); and Cystoscopy with urethral dilatation (N/A, 08/29/2021).  Brief Summary:  Allen [name] is a 71 y.o. male former smoker with asthma, emphysema, and sleep related hypoxia.      Subjective:   He has lost about 35 lbs since Allen [name] last visit.  Feels much better.  Stopped using CPAP because of mask irritation.  Sleeping well.  He is exercising more.  Only gets short of breath now going up stairs.  Not having cough, wheeze, or sputum.  Feels that trelegy helps.  He has more trouble with reflux.  This causes Allen [name] voice to be raspy.  Physical Exam:   Appearance - well kempt   ENMT - no sinus tenderness, no oral exudate, no LAN, Mallampati 3 airway, no  stridor, raspy voice  Respiratory - equal breath sounds bilaterally, no wheezing or rales  CV - s1s2 regular rate and rhythm, no murmurs  Ext - no clubbing, no edema  Skin - no rashes  Psych - normal mood and affect      Pulmonary testing:  PFT 12/21/16 >> FEV1 2.40 (69%), FEV1% 74, TLC 4.95 (73%), DLCO 77%, +BD IgE 04/10/19 >> 66 PFT 04/20/19 >> FEV1 2.09 (62%), FEV1% 74, TLC 5.33 (75%), DLCO 93%  Chest Imaging:  CT chest 05/04/19 >> centrilobular and paraseptal emphysema, 3 mm nodule RUL, 4 mm nodule RML, 3 mm nodule lingula CT chest 05/11/20 >> stable nodules up to 3 mm  Sleep Tests:  HST 04/20/15 >> AHI 40 Auto Bipap 05/21/19 to 06/01/19 >> used on 11 of 12 nights with average 6 hrs 50 min.  Average AHI 9.1 with median Bipap 11/7 and 95 th percentile Bipap 12/8 cm H2O. ONO with RA 11/17/19 >> test time 2 hrs 42 min.  Baseline SpO2 88%, low SpO2 82%.  Spent 1 hr 27 min with SpO2 < 88%. HST 04/26/21 >> AHI 8.1, SpO2 low 82%, spent 149 min with SpO2 < 89%  Cardiac Tests:  Echo 05/29/19 >> EF 60 to 65%, grade 1 DD, mod LA dilation  Social History:  He  reports that he quit smoking about 32 years ago. Allen [name] smoking use included cigarettes. He has a 60.00 pack-year smoking history. He has never used smokeless tobacco. He reports that he does not currently use alcohol. He reports that he  does not use drugs.  Family History:  Allen [name] family history includes Asthma in Allen [name] father; COPD in Allen [name] mother; Healthy in Allen [name] child; Rectal cancer in Allen [name] sister.     Assessment/Plan:   COPD with emphysema and asthma. - breztri and symbicort were ineffective and too expensive - will change to trelegy 100 one puff daily - prn albuterol - he keeps a script of zithromax and prednisone in case of flare up when he travels  Obstructive sleep apnea. - resolved after weight loss  Hoarseness with reflux. - will arrange for referral to gastroenterology - advised he could try OTC PPI as needed for  now  Perennial allergic rhinitis with nonallergic component. - followed by Dr. Salvatore Marvel with [facility]   Tremor, Foot paresthesia. - followed by Dr. Wells Guiles Tat with [facility]   Time Spent Involved in Patient Care on Day of Examination:  36 minutes  Follow up:   Patient Instructions  Will lower dose of trelegy.  Will arrange for referral to gastroenterology.  You don't need to use CPAP anymore.  Follow up in 6 months.  Medication List:   Allergies as of 11/07/2021       Reactions   Copper-containing Compounds    Copper gloves caused redness and blisters bilateral hands        Medication List        Accurate as of November 07, 2021 11:31 AM. If you have any questions, ask your nurse or doctor.          STOP taking these medications    Trelegy Ellipta 200-62.5-25 MCG/ACT Aepb Generic drug: Fluticasone-Umeclidin-Vilant Replaced by: Trelegy Ellipta 100-62.5-25 MCG/ACT Aepb Stopped by: Chesley Mires, MD       TAKE these medications    azelastine 0.1 % nasal spray Commonly known as: ASTELIN Place 1-2 sprays into both nostrils 2 (two) times daily as needed for rhinitis.   buPROPion 150 MG 24 hr tablet Commonly known as: WELLBUTRIN XL Take 150 mg by mouth every morning.   Coenzyme Q10 300 MG Caps Take 300 mg by mouth in the morning.   escitalopram 20 MG tablet Commonly known as: LEXAPRO Take 1 tablet (20 mg total) by mouth daily. What changed: when to take this   FISH OIL PO Take 2,200 mg by mouth in the morning.   fluticasone 50 MCG/ACT nasal spray Commonly known as: Flonase Place 2 sprays into both nostrils daily as needed for allergies or rhinitis.   Myrbetriq 50 MG Tb24 tablet Generic drug: mirabegron ER Take 50 mg by mouth in the morning.   pantoprazole 40 MG tablet Commonly known as: PROTONIX TAKE 1 TABLET EVERY DAY   primidone 50 MG tablet Commonly known as: MYSOLINE TAKE 2 TABLETS IN THE MORNING AND TAKE 1  TABLET IN THE EVENING   tadalafil 20 MG tablet Commonly known as: CIALIS Take 1 tablet (20 mg total) by mouth daily as needed for erectile dysfunction. What changed:  how much to take when to take this   tamsulosin 0.4 MG Caps capsule Commonly known as: FLOMAX Take 0.4 mg by mouth in the morning.   tiZANidine 2 MG tablet Commonly known as: ZANAFLEX Take 1 tablet (2 mg total) by mouth 3 (three) times daily.   traMADol 50 MG tablet Commonly known as: Ultram Take 1 tablet (50 mg total) by mouth every 6 (six) hours as needed.   traZODone 100 MG tablet Commonly known as: DESYREL TAKE 1 TABLET AT BEDTIME   Trelegy  Ellipta 100-62.5-25 MCG/ACT Aepb Generic drug: Fluticasone-Umeclidin-Vilant Inhale 1 puff into the lungs daily in the afternoon. Replaces: Trelegy Ellipta 200-62.5-25 MCG/ACT Aepb Started by: Chesley Mires, MD   Trelegy Ellipta 100-62.5-25 MCG/ACT Aepb Generic drug: Fluticasone-Umeclidin-Vilant Inhale 1 puff into the lungs daily. Started by: Chesley Mires, MD   TURMERIC PO Take 2,250 mg by mouth in the morning.   vitamin C 1000 MG tablet Take 1,000 mg by mouth in the morning.   Vitamin D3 125 MCG (5000 UT) Tabs Take 5,000 Units by mouth in the morning.   zaleplon 10 MG capsule Commonly known as: SONATA Take 10 mg by mouth at bedtime.        Signature:  Chesley Mires, MD [place] Pager - [phone] 11/07/2021, 11:31 AM

## 2021-11-07 NOTE — Patient Instructions (Signed)
Will lower dose of trelegy.  Will arrange for referral to gastroenterology.  You don't need to use CPAP anymore.  Follow up in 6 months.

## 2021-11-08 DIAGNOSIS — F411 Generalized anxiety disorder: Secondary | ICD-10-CM | POA: Diagnosis not present

## 2021-11-08 DIAGNOSIS — F908 Attention-deficit hyperactivity disorder, other type: Secondary | ICD-10-CM | POA: Diagnosis not present

## 2021-11-08 DIAGNOSIS — F331 Major depressive disorder, recurrent, moderate: Secondary | ICD-10-CM | POA: Diagnosis not present

## 2021-11-10 DIAGNOSIS — F901 Attention-deficit hyperactivity disorder, predominantly hyperactive type: Secondary | ICD-10-CM | POA: Diagnosis not present

## 2021-11-13 ENCOUNTER — Ambulatory Visit: Payer: Medicare HMO | Admitting: Gastroenterology

## 2021-11-13 ENCOUNTER — Encounter: Payer: Self-pay | Admitting: Gastroenterology

## 2021-11-13 VITALS — BP 110/78 | HR 84 | Ht 70.0 in | Wt 217.0 lb

## 2021-11-13 DIAGNOSIS — R490 Dysphonia: Secondary | ICD-10-CM

## 2021-11-13 DIAGNOSIS — Z800 Family history of malignant neoplasm of digestive organs: Secondary | ICD-10-CM | POA: Diagnosis not present

## 2021-11-13 DIAGNOSIS — Z79899 Other long term (current) drug therapy: Secondary | ICD-10-CM

## 2021-11-13 DIAGNOSIS — Z86010 Personal history of colonic polyps: Secondary | ICD-10-CM | POA: Diagnosis not present

## 2021-11-13 MED ORDER — OMEPRAZOLE 40 MG PO CPDR: 40.0000 mg | DELAYED_RELEASE_CAPSULE | Freq: Two times a day (BID) | ORAL | 3 refills | Status: DC

## 2021-11-13 NOTE — Progress Notes (Signed)
HPI :  71 y/o with history of COPD and asthma, OSA, hoarseness / reflux, referred by Chesley Mires MD of pulmonary for reflux.  Patient reports chronic hoarseness of his voice ongoing for years, perhaps more than 8 years or so.  States he was seen by an ENT physician in [place] a few years ago, had a laryngoscopy done and told he had reflux as the cause of his symptoms.  He has been on Protonix 40 mg once daily for at least the past year or so, states it has not really helped much at all.  He states he increase this to twice daily for few weeks and again no benefit with that.  He denies any typical symptoms of reflux to include heartburn /pyrosis, water brash, regurgitation.  He does have a history of COPD/asthma, denies any chronic cough.  He does endorse some postnasal drip that bothers him in the morning but usually not the rest of the day.  No dysphagia.  No problems with his eating in general.  Denies any nausea or vomiting.  No abdominal pains.  He has lost about 38 pounds since July with dieting program.  He denies any benefit in his hoarse voice with the weight loss.  He states his COPD is well controlled on Trelegy, he has mild baseline dyspnea if he exerts himself such as climbing stairs or hills but on walking flat he has no limitations.  He does not use any oxygen.  He is very active and goes to the gym frequently.  With his weight loss he was able to come off CPAP/BiPAP for his OSA and sleeps well without this.  He denies any problems with his bowels currently.  No blood in his stools.  He denies any abdominal pains.  He does endorse a history of "colitis" in the past.  States his sister was recently diagnosed with rectal cancer at age 28.  His first wife passed away from colon cancer, he is very sensitive to this and wonders when he is next due for his colonoscopy.  He had a colonoscopy in June 2019 in [place] in which a few polyps were removed and he was told to repeat the exam in 5 years  although he had no adenomas or sessile serrated polyps on exam.  He is otherwise feeling well today without any specific complaints.   Echocardiogram 05/29/19: - EF 60-65%, mild LVH, grade I DD  Colonoscopy 06/21/2017 - done in [place] - 2 diminutive polyps removed hepatic flexure, another diminutive polyp sigmoid colon. Told to repeat in 5 years. Normal mucosa right colon, hyperplastic polyp left colon   Past Medical History:  Diagnosis Date   Anxiety    Arthritis    neck -   BPH with obstruction/lower urinary tract symptoms    urologist-- dr pace   Cancer [facility])    basal cell to nose   Centrilobular emphysema ([place])    followed by dr Halford Chessman   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followed by dr Halford Chessman   Cough 07/2020   covid residual, no congestion   DOE (dyspnea on exertion)    due to copd/ emphysema   ED (erectile dysfunction)    Essential tremor    neurologist--- dr tat----  bilateral upper extremities,  s/p DPS 12/ 2016   GERD (gastroesophageal reflux disease)    History of 2019 novel coronavirus disease (COVID-19) 07/18/2020   per pt postive home test 07-18-2020 w/ mild  symptoms per pt; pt stated had  urgent care visit 07-24-2020 for sob/ cough with negative cxr (in care everywhere) ;  follow up pcp visit 08-03-2020 in epic   Insomnia    Mixed simple and mucopurulent chronic bronchitis (HCC)    OSA (obstructive sleep apnea)    followed by dr Halford Chessman---- no longer does uses bipap, intolerant --- pt prescriped  Oxygen  2 L oxygen via [place] while sleeping  (study in epic 04/ 2017 [facility] 40)   Perennial allergic rhinitis    Pneumonia    Pulmonary nodules    followed by dr Halford Chessman   S/P deep brain stimulator placement 12/2014   followed by neurology-----IPG change 01-21-2020 for end-of life battery  (pt has a control)   Varicose vein of leg    per pt has had 3 procedure's last one 2006   Wears dentures    upper     Past Surgical History:  Procedure Laterality Date   ANKLE SURGERY Right 2020   tendon  repair   APPENDECTOMY     CARPAL TUNNEL RELEASE Left 2019   CATARACT EXTRACTION W/ INTRAOCULAR LENS IMPLANT Bilateral 2020   COLONOSCOPY     CYSTOSCOPY WITH URETHRAL DILATATION N/A 08/29/2021   Procedure: CYSTOSCOPY WITH URETHRAL DILATATION;  Surgeon: Robley Fries, MD;  Location: [facility];  Service: Urology;  Laterality: N/A;   DEEP BRAIN STIMULATOR PLACEMENT Left 2016   INGUINAL HERNIA REPAIR Right 2007   ROTATOR CUFF REPAIR Right 2014   SUBTHALAMIC STIMULATOR BATTERY REPLACEMENT N/A 01/21/2020   Procedure: Deep brain stimulator battery replacement;  Surgeon: Erline Levine, MD;  Location: [place];  Service: Neurosurgery;  Laterality: N/A;   TONSILLECTOMY AND ADENOIDECTOMY     age 40   TOTAL SHOULDER REPLACEMENT Left 2016   TRANSURETHRAL RESECTION OF PROSTATE N/A 08/09/2020   Procedure: TRANSURETHRAL RESECTION OF THE PROSTATE (TURP), BLADDER BIOPSY AND FULGERATION;  Surgeon: Robley Fries, MD;  Location: [facility];  Service: Urology;  Laterality: N/A;   TRANSURETHRAL RESECTION OF PROSTATE N/A 08/29/2021   Procedure: RE-TRANSURETHRAL RESECTION OF THE PROSTATE (TURP);  Surgeon: Robley Fries, MD;  Location: [facility];  Service: Urology;  Laterality: N/A;   VEIN SURGERY Left 2006   varicose veins   Family History  Problem Relation Age of Onset   COPD Mother    Asthma Father    Rectal cancer Sister    Healthy Child    Urticaria Neg Hx    Immunodeficiency Neg Hx    Eczema Neg Hx    Atopy Neg Hx    Angioedema Neg Hx    Allergic rhinitis Neg Hx    Social History   Tobacco Use   Smoking status: Former    Packs/day: 3.00    Years: 20.00    Total pack years: 60.00    Types: Cigarettes    Quit date: 1991    Years since quitting: 32.8   Smokeless tobacco: Never  Vaping Use   Vaping Use: Never used  Substance Use Topics   Alcohol use: Not Currently    Comment: occassionally   Drug use: Never   Current Outpatient Medications  Medication Sig Dispense Refill    Ascorbic Acid (VITAMIN C) 1000 MG tablet Take 1,000 mg by mouth in the morning.     azelastine (ASTELIN) 0.1 % nasal spray Place 1-2 sprays into both nostrils 2 (two) times daily as needed for rhinitis. 30 mL 5   buPROPion (WELLBUTRIN XL) 150 MG 24 hr tablet Take 150 mg by mouth  every morning.     Cholecalciferol (VITAMIN D3) 125 MCG (5000 UT) TABS Take 5,000 Units by mouth in the morning.     Coenzyme Q10 300 MG CAPS Take 300 mg by mouth in the morning.     escitalopram (LEXAPRO) 20 MG tablet Take 1 tablet (20 mg total) by mouth daily. (Patient taking differently: Take 20 mg by mouth at bedtime.) 90 tablet 1   fluticasone (FLONASE) 50 MCG/ACT nasal spray Place 2 sprays into both nostrils daily as needed for allergies or rhinitis. 16 g 5   Fluticasone-Umeclidin-Vilant (TRELEGY ELLIPTA) 100-62.5-25 MCG/ACT AEPB Inhale 1 puff into the lungs daily. 28 each 0   MYRBETRIQ 50 MG TB24 tablet Take 50 mg by mouth in the morning.     Omega-3 Fatty Acids (FISH OIL PO) Take 2,200 mg by mouth in the morning.     pantoprazole (PROTONIX) 40 MG tablet TAKE 1 TABLET EVERY DAY 90 tablet 1   primidone (MYSOLINE) 50 MG tablet TAKE 2 TABLETS IN THE MORNING AND TAKE 1 TABLET IN THE EVENING 90 tablet 0   tadalafil (CIALIS) 20 MG tablet Take 1 tablet (20 mg total) by mouth daily as needed for erectile dysfunction. (Patient taking differently: Take 10 mg by mouth daily.) 90 tablet 3   tamsulosin (FLOMAX) 0.4 MG CAPS capsule Take 0.4 mg by mouth in the morning.     tiZANidine (ZANAFLEX) 2 MG tablet Take 1 tablet (2 mg total) by mouth 3 (three) times daily. 90 tablet 0   traMADol (ULTRAM) 50 MG tablet Take 1 tablet (50 mg total) by mouth every 6 (six) hours as needed. 20 tablet 0   traZODone (DESYREL) 100 MG tablet TAKE 1 TABLET AT BEDTIME 90 tablet 1   TURMERIC PO Take 2,250 mg by mouth in the morning.     zaleplon (SONATA) 10 MG capsule Take 10 mg by mouth at bedtime.     No current facility-administered medications  for this visit.   Allergies  Allergen Reactions   Copper-Containing Compounds     Copper gloves caused redness and blisters bilateral hands     Review of Systems: All systems reviewed and negative except where noted in HPI.   Lab Results  Component Value Date   WBC 6.7 08/22/2021   HGB 13.1 08/22/2021   HCT 40.8 08/22/2021   MCV 97.8 08/22/2021   PLT 277 08/22/2021    Lab Results  Component Value Date   CREATININE 1.21 08/22/2021   BUN 15 08/22/2021   NA 139 08/22/2021   K 4.8 08/22/2021   CL 108 08/22/2021   CO2 26 08/22/2021    Lab Results  Component Value Date   ALT 20 07/20/2021   AST 21 07/20/2021   ALKPHOS 46 07/20/2021   BILITOT 0.5 07/20/2021     Physical Exam: BP 110/78   Pulse 84   Ht 5' 10"  (1.778 m)   Wt 217 lb (98.4 kg)   BMI 31.14 kg/m  Constitutional: Pleasant,well-developed, male in no acute distress. HEENT: Normocephalic and atraumatic. Conjunctivae are normal. No scleral icterus. Neck supple.  Cardiovascular: Normal rate, regular rhythm.  Pulmonary/chest: Effort normal and breath sounds normal.  Abdominal: Soft, nondistended, nontender.  There are no masses palpable.  Extremities: no edema Lymphadenopathy: No cervical adenopathy noted. Neurological: Alert and oriented to person place and time. Skin: Skin is warm and dry. No rashes noted. Psychiatric: Normal mood and affect. Behavior is normal.   ASSESSMENT: 71 y.o. male here for assessment of the following  1. Hoarseness of voice   2. Long-term current use of proton pump inhibitor therapy   3. History of colon polyps   4. Family history of colon cancer    Longstanding hoarseness of voice with a negative laryngoscopy years ago, told he had reflux otherwise.  He has been on chronic PPI for the past year at moderate dose without any benefit in his symptoms.  Further, he has had significant weight loss which is typically a good reflux therapy, and has had no benefit with his symptoms  with that either.  He has no other typical symptoms of reflux otherwise.  In this light I am not convinced that reflux is causing his hoarseness, it actually may be less likely at this time that GERD is driving his symptoms, however I offered him an endoscopy to evaluate for erosive changes, rule out Barrett's or evidence of chronic reflux otherwise as we try to sort this out.  I discussed what EGD is with him, risks and benefits, he wants to proceed.  In the interim we will stop Protonix and will try him on high-dose omeprazole 40 mg twice daily for 4 to 6 weeks and see if he has any benefit with a different regimen.  If the EGD is positive that will be helpful.  If the EGD is negative, still does not rule out nonacid reflux.  May need to consider 24-hour pH impedance test to most definitively rule out reflux however clinically if the EGD is negative that may be less likely needed.  He may need to go back and see ENT or allergy pending his course, has a history of significant allergies and postnasal drip that bothers him much could be related.  We otherwise discussed his last colonoscopy and his family history of colon cancer.  I counseled him that his sisters history would not to serially influence how frequently he needs it done given she was diagnosed later in life, however he is very sensitive to this between his sister and his wife having colon cancer, he strongly wants to have a colonoscopy 5 years after his last exam next June which is what his prior GI doctor told him.  We will place a recall for colonoscopy 07/05/2022.  PLAN: - schedule EGD at the [facility] - stop protonix - start trial of high dose omeprazole 68m BID for 4-6 weeks. If no benefit and EGD is negative will likely stop PPI and consider 24 hour pH impedance testing and referral back to ENT and / or Allergy (? component of post nasal drip) - place recall for colonoscopy June 2026 - he is anxious about his sister / wife's history of colon  cancer  SJolly Mango MD [facility]  CC: HIsaac Bliss Estel*

## 2021-11-13 NOTE — Patient Instructions (Addendum)
If you are age 71 or older, your body mass index should be between 23-30. Your Body mass index is 31.14 kg/m. If this is out of the aforementioned range listed, please consider follow up with your Primary Care Provider.  If you are age 13 or younger, your body mass index should be between 19-25. Your Body mass index is 31.14 kg/m. If this is out of the aformentioned range listed, please consider follow up with your Primary Care Provider.   ________________________________________________________  Allen [name] have been scheduled for an endoscopy. Please follow written instructions given to you at your visit today. If you use inhalers (even only as needed), please bring them with you on the day of your procedure.   Stop Protonix (pantoprazole).  We have sent the following medications to your pharmacy for you to pick up at your convenience: Omeprazole 40 mg: Take twice a day  You will be due for a recall colonoscopy in 06-2021. We will send you a reminder in the mail when it gets closer to that time.  Thank you for entrusting me with your care and for choosing [facility], Dr. Talala Cellar

## 2021-11-15 ENCOUNTER — Encounter: Payer: Self-pay | Admitting: Physician Assistant

## 2021-11-15 ENCOUNTER — Ambulatory Visit: Payer: Medicare HMO | Admitting: Physician Assistant

## 2021-11-15 VITALS — BP 116/74 | HR 98 | Resp 18 | Wt 215.0 lb

## 2021-11-15 DIAGNOSIS — G250 Essential tremor: Secondary | ICD-10-CM | POA: Diagnosis not present

## 2021-11-15 DIAGNOSIS — F901 Attention-deficit hyperactivity disorder, predominantly hyperactive type: Secondary | ICD-10-CM | POA: Diagnosis not present

## 2021-11-15 DIAGNOSIS — R413 Other amnesia: Secondary | ICD-10-CM

## 2021-11-15 NOTE — Patient Instructions (Signed)
It was a pleasure to see you today at our office.   Recommendations: Follow up pending on the Neuropsych evaluation results,  Continue B12 replenishment Neuropsychological evaluation scheduled for 06/2022  for clarity of diagnosis .  Recommend discussing with GI other agents than omeprazole for his GERD as there is evidence that prolonged use can have an effect on memory  Continue primidone 100 mg in the morning and 50 mg at night  or as directed by Dr. Carles Collet. Follow up with Dr. Carles Collet at the movement disorder clinic on Jan 2024 (6 mo f/u)  Whom to call:  Memory  decline, memory medications: Call our office [phone]   For psychiatric meds, mood meds: Please have your primary care physician manage these medications.   Counseling regarding caregiver distress, including caregiver depression, anxiety and issues regarding community resources, adult day care programs, adult living facilities, or memory care questions:   Feel free to contact [place], Social Worker at [phone]   For assessment of decision of mental capacity and competency:  Call Dr. Anthoney Harada, geriatric psychiatrist at [phone]  For guidance in geriatric dementia issues please call [facility] [phone]  For guidance regarding [facility] and if placement were needed at the facility, contact Arnell Asal, Social Worker tel: [phone]  If you have any severe symptoms of a stroke, or other severe issues such as confusion,severe chills or fever, etc call 911 or go to the ER as you may need to be evaluated further     RECOMMENDATIONS FOR ALL PATIENTS WITH MEMORY PROBLEMS: 1. Continue to exercise (Recommend 30 minutes of walking everyday, or 3 hours every week) 2. Increase social interactions - continue going to [place] and enjoy social gatherings with friends and family 3. Eat healthy, avoid fried foods and eat more fruits and vegetables 4. Maintain adequate blood  pressure, blood sugar, and blood cholesterol level. Reducing the risk of stroke and cardiovascular disease also helps promoting better memory. 5. Avoid stressful situations. Live a simple life and avoid aggravations. Organize your time and prepare for the next day in anticipation. 6. Sleep well, avoid any interruptions of sleep and avoid any distractions in the bedroom that may interfere with adequate sleep quality 7. Avoid sugar, avoid sweets as there is a strong link between excessive sugar intake, diabetes, and cognitive impairment We discussed the Mediterranean diet, which has been shown to help patients reduce the risk of progressive memory disorders and reduces cardiovascular risk. This includes eating fish, eat fruits and green leafy vegetables, nuts like almonds and hazelnuts, walnuts, and also use olive oil. Avoid fast foods and fried foods as much as possible. Avoid sweets and sugar as sugar use has been linked to worsening of memory function.  There is always a concern of gradual progression of memory problems. If this is the case, then we may need to adjust level of care according to patient needs. Support, both to the patient and caregiver, should then be put into place.      You have been referred for a neuropsychological evaluation (i.e., evaluation of memory and thinking abilities). Please bring someone with you to this appointment if possible, as it is helpful for the doctor to hear from both you and another adult who knows you well. Please bring eyeglasses and hearing aids if you wear them.    The evaluation will take approximately 3 hours and has two parts:   The first part is a clinical interview with the neuropsychologist (  Dr. Melvyn Novas or Dr. Nicole Kindred). During the interview, the neuropsychologist will speak with you and the individual you brought to the appointment.    The second part of the evaluation is testing with the doctor's technician Hinton Dyer or Maudie Mercury). During the testing, the  technician will ask you to remember different types of material, solve problems, and answer some questionnaires. Your family member will not be present for this portion of the evaluation.   Please note: We must reserve several hours of the neuropsychologist's time and the psychometrician's time for your evaluation appointment. As such, there is a No-Show fee of $100. If you are unable to attend any of your appointments, please contact our office as soon as possible to reschedule.    FALL PRECAUTIONS: Be cautious when walking. Scan the area for obstacles that may increase the risk of trips and falls. When getting up in the mornings, sit up at the edge of the bed for a few minutes before getting out of bed. Consider elevating the bed at the head end to avoid drop of blood pressure when getting up. Walk always in a well-lit room (use night lights in the walls). Avoid area rugs or power cords from appliances in the middle of the walkways. Use a walker or a cane if necessary and consider physical therapy for balance exercise. Get your eyesight checked regularly.  FINANCIAL OVERSIGHT: Supervision, especially oversight when making financial decisions or transactions is also recommended.  HOME SAFETY: Consider the safety of the kitchen when operating appliances like stoves, microwave oven, and blender. Consider having supervision and share cooking responsibilities until no longer able to participate in those. Accidents with firearms and other hazards in the house should be identified and addressed as well.   ABILITY TO BE LEFT ALONE: If patient is unable to contact 911 operator, consider using LifeLine, or when the need is there, arrange for someone to stay with patients. Smoking is a fire hazard, consider supervision or cessation. Risk of wandering should be assessed by caregiver and if detected at any point, supervision and safe proof recommendations should be instituted.  MEDICATION SUPERVISION: Inability  to self-administer medication needs to be constantly addressed. Implement a mechanism to ensure safe administration of the medications.   DRIVING: Regarding driving, in patients with progressive memory problems, driving will be impaired. We advise to have someone else do the driving if trouble finding directions or if minor accidents are reported. Independent driving assessment is available to determine safety of driving.   If you are interested in the driving assessment, you can contact the following:  [employer] in [place]  [place] [phone] or [phone]    [place] refers to food and lifestyle choices that are based on the traditions of countries located on the [employer]. This way of eating has been shown to help prevent certain conditions and improve outcomes for people who have chronic diseases, like kidney disease and heart disease. What are tips for following this plan? Lifestyle  Cook and eat meals together with your family, when possible. Drink enough fluid to keep your urine clear or pale yellow. Be physically active every day. This includes: Aerobic exercise like running or swimming. Leisure activities like gardening, walking, or housework. Get 7-8 hours of sleep each night. If recommended by your health care provider, drink red wine in moderation. This means 1 glass a day for nonpregnant women and 2 glasses  a day for men. A glass of wine equals 5 oz (150 mL). Reading food labels  Check the serving size of packaged foods. For foods such as rice and pasta, the serving size refers to the amount of cooked product, not dry. Check the total fat in packaged foods. Avoid foods that have saturated fat or trans fats. Check the ingredients list for added sugars, such as corn syrup. Shopping  At the grocery store, buy  most of your food from the areas near the walls of the store. This includes: Fresh fruits and vegetables (produce). Grains, beans, nuts, and seeds. Some of these may be available in unpackaged forms or large amounts (in bulk). Fresh seafood. Poultry and eggs. Low-fat dairy products. Buy whole ingredients instead of prepackaged foods. Buy fresh fruits and vegetables in-season from local farmers markets. Buy frozen fruits and vegetables in resealable bags. If you do not have access to quality fresh seafood, buy precooked frozen shrimp or canned fish, such as tuna, salmon, or sardines. Buy small amounts of raw or cooked vegetables, salads, or olives from the deli or salad bar at your store. Stock your pantry so you always have certain foods on hand, such as olive oil, canned tuna, canned tomatoes, rice, pasta, and beans. Cooking  Cook foods with extra-virgin olive oil instead of using butter or other vegetable oils. Have meat as a side dish, and have vegetables or grains as your main dish. This means having meat in small portions or adding small amounts of meat to foods like pasta or stew. Use beans or vegetables instead of meat in common dishes like chili or lasagna. Experiment with different cooking methods. Try roasting or broiling vegetables instead of steaming or sauteing them. Add frozen vegetables to soups, stews, pasta, or rice. Add nuts or seeds for added healthy fat at each meal. You can add these to yogurt, salads, or vegetable dishes. Marinate fish or vegetables using olive oil, lemon juice, garlic, and fresh herbs. Meal planning  Plan to eat 1 vegetarian meal one day each week. Try to work up to 2 vegetarian meals, if possible. Eat seafood 2 or more times a week. Have healthy snacks readily available, such as: Vegetable sticks with hummus. Greek yogurt. Fruit and nut trail mix. Eat balanced meals throughout the week. This includes: Fruit: 2-3 servings a day Vegetables: 4-5  servings a day Low-fat dairy: 2 servings a day Fish, poultry, or lean meat: 1 serving a day Beans and legumes: 2 or more servings a week Nuts and seeds: 1-2 servings a day Whole grains: 6-8 servings a day Extra-virgin olive oil: 3-4 servings a day Limit red meat and sweets to only a few servings a month What are my food choices? Mediterranean diet Recommended Grains: Whole-grain pasta. Brown rice. Bulgar wheat. Polenta. Couscous. Whole-wheat bread. [place]. Vegetables: Artichokes. Beets. Broccoli. Cabbage. Carrots. Eggplant. Green beans. Chard. Kale. Spinach. Onions. Leeks. Peas. Squash. Tomatoes. Peppers. Radishes. Fruits: Apples. Apricots. Avocado. Berries. Bananas. Cherries. Dates. Figs. Grapes. Lemons. Melon. Oranges. Peaches. Plums. Pomegranate. Meats and other protein foods: Beans. Almonds. Sunflower seeds. Pine nuts. Peanuts. [place]. Salmon. Scallops. Shrimp. [place]. Tilapia. Clams. Oysters. Eggs. Dairy: Low-fat milk. Cheese. Greek yogurt. Beverages: Water. Red wine. Herbal tea. Fats and oils: Extra virgin olive oil. Avocado oil. Grape seed oil. Sweets and desserts: [place] yogurt with honey. Baked apples. Poached pears. Trail mix. Seasoning and other foods: Basil. Cilantro. Coriander. Cumin. Mint. Parsley. Sage. Rosemary. Tarragon. Garlic. Oregano. Thyme. Pepper. Balsalmic vinegar. Tahini. Hummus. Tomato sauce.  Olives. Mushrooms. Limit these Grains: Prepackaged pasta or rice dishes. Prepackaged cereal with added sugar. Vegetables: Deep fried potatoes (french fries). Fruits: Fruit canned in syrup. Meats and other protein foods: Beef. Pork. Lamb. Poultry with skin. Hot dogs. Berniece Salines. Dairy: Ice cream. Sour cream. Whole milk. Beverages: Juice. Sugar-sweetened soft drinks. Beer. Liquor and spirits. Fats and oils: Butter. Canola oil. Vegetable oil. Beef fat (tallow). Lard. Sweets and desserts: Cookies. Cakes. Pies. Candy. Seasoning and other foods: Mayonnaise. Premade sauces and  marinades. The items listed may not be a complete list. Talk with your dietitian about what dietary choices are right for you. Summary The Mediterranean diet includes both food and lifestyle choices. Eat a variety of fresh fruits and vegetables, beans, nuts, seeds, and whole grains. Limit the amount of red meat and sweets that you eat. Talk with your health care provider about whether it is safe for you to drink red wine in moderation. This means 1 glass a day for nonpregnant women and 2 glasses a day for men. A glass of wine equals 5 oz (150 mL). This information is not intended to replace advice given to you by your health care provider. Make sure you discuss any questions you have with your health care provider. Document Released: 08/25/2015 Document Revised: 09/27/2015 Document Reviewed: 08/25/2015 Elsevier Interactive Patient Education  2017 [employer].   We have sent a referral to [facility] for your MRI and they will call you directly to schedule your appointment. They are located at [place]. If you need to contact them directly please call [phone].

## 2021-11-15 NOTE — Progress Notes (Signed)
Assessment/Plan:   Memory Difficulties  Allen [name] is a very pleasant 71 y.o. RH male with  a history of  hyperlipidemia, COPD, known small pulmonary nodule OSA not on BiPAP anymore as per MD (lost all the excess weight), past, chronic diastolic CHF, vitamin D deficiency, insomnia, essential tremor status post DBS currently on primidone (Dr. Carles Collet) seen today in follow up to discuss the MRI of the brain results. These were personally reviewed, without acute findings, normal volume for age without visible atrophy and normal vascular load. GIven these findings, no antidementia medication is indicated at this time. Patient has a Neuropsych evaluation scheduled for 06/2022 for clarity of diagnosis and rule out other causes of memory difficulties including depression, anxiety, ADD/ADHD among differentials      Follow up pending on the Neuropsych evaluation results,  Continue B12 replenishment Neuropsychological evaluation scheduled for 06/2022  for clarity of diagnosis .  Recommend discussing with GI other agents than omeprazole for his GERD as there is evidence that prolonged use can have an effect on memory   Essential Tremor s/p DBS, on primidone  DBS last adjusted  on 07/31/21.  Primidone is helpful for the L hand, no significant changes from his prior visit   Continue primidone 100 am, 50 mg qhs  or as directed by Dr. Carles Collet. Follow up with Dr. Carles Collet at the movement disorder clinic on Jan 2024 (6 mo f/u)     Subjective:    This patient is here alone.  Previous records as well as any outside records available were reviewed prior to todays visit.    Any changes in memory since last visit? Since the last visit no new memory concerns were verbalized    Personally reviewed MRI brain 10/25/21 was unremarkable. No acute findings, normal volume for age without visible atrophy and normal vascular load   Initial memory clinic visit 09/28/21 How long did patient have memory difficulties?" For  the last 2 years "I know what I want to say, but in a middle of a conversation I have to look for a substitute word and then I become anxious" .  His wife reports that he "feels in the blanks with suspects that they are not accurate and at times, he exaggerates ".  He is not very active, his wife reports that he "sits all day, does not read anymore, and hard time concentrating ".  In addition, he cannot remember numbers as before.  His wife also reports that lately he forgets conversations at least 2 times over the last week.  He is able to remember events that took place a long time ago however.  In addition, his wife states that he repeats himself, asking the same question or telling the same story but this is not very often. Patient lives with: Spouse   Disoriented when walking into a room?  Patient denies   Leaving objects in unusual places?  Patient denies   Ambulates  with difficulty?   Patient denies   Recent falls?  Recent mechanical fall without head injury Any head injuries?  Patient denies   History of seizures?   Patient denies   Wandering behavior?  Patient denies   Patient drives?   No issues  Any mood changes such irritability agitation?  Patient denies   Any history of depression? Recently diagnosed and he is under the care of a psychiatrist at this time.  Takes Lexapro, recently added Buspar and Wellbutrin with good results . His therapist believes that he  may have undiagnosed ADHD/ADD.  Hallucinations?  Patient denies   Paranoia?  Patient denies   Patient reports that  he has a history of insomnia, without vivid dreams, REM behavior or sleepwalking .   History of sleep apnea? Endorsed, on BiPAP. He had stopped for a brief time, until recent fitting will use it again today.  Any hygiene concerns?  Patient denies   Independent of bathing and dressing?  Endorsed  Does the patient needs help with medications? Wife in charge  Who is in charge of the finances?  Patient is in charge. "I  am OCD about that"    Any changes in appetite?  Recently he has been on Nutrisystem for 2 months and has had 23 lbs weight loss  Patient have trouble swallowing? Patient denies   Does the patient cook?  Patient denies   Any kitchen accidents such as leaving the stove on? Denies Any headaches?  Patient denies   Double vision? Patient denies   Any focal numbness or tingling?  Patient denies   Chronic back pain Patient denies   Unilateral weakness?  Patient denies   Any tremors?  Has a history of essential tremors, status post DBS followed-up at our office he is on primidone at this time, with good results. Any history of anosmia?  Patient denies   Any incontinence of urine? Split urgency and stream due to BPH, recent TURP, urology following Any bowel dysfunction?   Patient denies   History of heavy alcohol intake?  Patient denies   History of heavy tobacco use?  Patient denies   Family history of dementia? Paternal  aunt had Alzheimer's disease    Pertinent labs July 2020: CBC and BMP normal A1c 5.9, triglycerides 4 otherwise normal lipid panel TSH 1.95, B12 271 (low normal) vitamin D normal 38.8  CURRENT MEDICATIONS:  Outpatient Encounter Medications as of 11/15/2021  Medication Sig   Ascorbic Acid (VITAMIN C) 1000 MG tablet Take 1,000 mg by mouth in the morning.   azelastine (ASTELIN) 0.1 % nasal spray Place 1-2 sprays into both nostrils 2 (two) times daily as needed for rhinitis.   buPROPion (WELLBUTRIN XL) 150 MG 24 hr tablet Take 150 mg by mouth every morning.   Cholecalciferol (VITAMIN D3) 125 MCG (5000 UT) TABS Take 5,000 Units by mouth in the morning.   Coenzyme Q10 300 MG CAPS Take 300 mg by mouth in the morning.   escitalopram (LEXAPRO) 20 MG tablet Take 1 tablet (20 mg total) by mouth daily. (Patient taking differently: Take 20 mg by mouth at bedtime.)   fluticasone (FLONASE) 50 MCG/ACT nasal spray Place 2 sprays into both nostrils daily as needed for allergies or rhinitis.    Fluticasone-Umeclidin-Vilant (TRELEGY ELLIPTA) 100-62.5-25 MCG/ACT AEPB Inhale 1 puff into the lungs daily.   MYRBETRIQ 50 MG TB24 tablet Take 50 mg by mouth in the morning.   Omega-3 Fatty Acids (FISH OIL PO) Take 2,200 mg by mouth in the morning.   omeprazole (PRILOSEC) 40 MG capsule Take 1 capsule (40 mg total) by mouth 2 (two) times daily.   primidone (MYSOLINE) 50 MG tablet TAKE 2 TABLETS IN THE MORNING AND TAKE 1 TABLET IN THE EVENING   tadalafil (CIALIS) 20 MG tablet Take 1 tablet (20 mg total) by mouth daily as needed for erectile dysfunction. (Patient taking differently: Take 10 mg by mouth daily.)   tamsulosin (FLOMAX) 0.4 MG CAPS capsule Take 0.4 mg by mouth in the morning.   tiZANidine (ZANAFLEX) 2 MG tablet  Take 1 tablet (2 mg total) by mouth 3 (three) times daily.   traMADol (ULTRAM) 50 MG tablet Take 1 tablet (50 mg total) by mouth every 6 (six) hours as needed.   traZODone (DESYREL) 100 MG tablet TAKE 1 TABLET AT BEDTIME   TURMERIC PO Take 2,250 mg by mouth in the morning.   zaleplon (SONATA) 10 MG capsule Take 10 mg by mouth at bedtime.   No facility-administered encounter medications on file as of 11/15/2021.        No data to display            09/29/2021    6:00 AM  Montreal Cognitive Assessment   Visuospatial/ Executive (0/5) 2  Naming (0/3) 3  Attention: Read list of digits (0/2) 2  Attention: Read list of letters (0/1) 1  Attention: Serial 7 subtraction starting at 100 (0/3) 3  Language: Repeat phrase (0/2) 1  Language : Fluency (0/1) 0  Abstraction (0/2) 2  Delayed Recall (0/5) 3  Orientation (0/6) 5  Total 22  Adjusted Score (based on education) 22   Thank you for allowing [place] the opportunity to participate in the care of this nice patient. Please do not hesitate to contact us for any questions or concerns.   Total time spent on today's visit was 28 minutes dedicated to this patient today, preparing to see patient, examining the patient, ordering tests  and/or medications and counseling the patient, documenting clinical information in the EHR or other health record, independently interpreting results and communicating results to the patient/family, discussing treatment and goals, answering patient's questions and coordinating care.  Cc:  Isaac Bliss, Rayford Halsted, MD  Sharene Butters 11/15/2021 6:40 AM

## 2021-11-16 ENCOUNTER — Encounter: Payer: Self-pay | Admitting: Internal Medicine

## 2021-11-16 ENCOUNTER — Ambulatory Visit (AMBULATORY_SURGERY_CENTER): Payer: Medicare HMO | Admitting: Gastroenterology

## 2021-11-16 ENCOUNTER — Encounter: Payer: Self-pay | Admitting: Gastroenterology

## 2021-11-16 ENCOUNTER — Telehealth: Payer: Self-pay | Admitting: Internal Medicine

## 2021-11-16 VITALS — BP 153/82 | HR 61 | Temp 98.0°F | Resp 13 | Ht 70.0 in | Wt 217.0 lb

## 2021-11-16 DIAGNOSIS — K219 Gastro-esophageal reflux disease without esophagitis: Secondary | ICD-10-CM | POA: Diagnosis not present

## 2021-11-16 DIAGNOSIS — K3189 Other diseases of stomach and duodenum: Secondary | ICD-10-CM | POA: Diagnosis not present

## 2021-11-16 DIAGNOSIS — J449 Chronic obstructive pulmonary disease, unspecified: Secondary | ICD-10-CM | POA: Diagnosis not present

## 2021-11-16 DIAGNOSIS — G4733 Obstructive sleep apnea (adult) (pediatric): Secondary | ICD-10-CM | POA: Diagnosis not present

## 2021-11-16 DIAGNOSIS — I509 Heart failure, unspecified: Secondary | ICD-10-CM | POA: Diagnosis not present

## 2021-11-16 DIAGNOSIS — R490 Dysphonia: Secondary | ICD-10-CM

## 2021-11-16 DIAGNOSIS — K2950 Unspecified chronic gastritis without bleeding: Secondary | ICD-10-CM | POA: Diagnosis not present

## 2021-11-16 DIAGNOSIS — K319 Disease of stomach and duodenum, unspecified: Secondary | ICD-10-CM | POA: Diagnosis not present

## 2021-11-16 DIAGNOSIS — I8002 Phlebitis and thrombophlebitis of superficial vessels of left lower extremity: Secondary | ICD-10-CM | POA: Diagnosis not present

## 2021-11-16 MED ORDER — SODIUM CHLORIDE 0.9 % IV SOLN: 500.0000 mL | Freq: Once | INTRAVENOUS | Status: DC

## 2021-11-16 NOTE — Telephone Encounter (Signed)
Pt wife called  and her husband went to UC for bulging vein in leg  today and pt had egd this afternoon with [facility] and per wife the UC told him to take nsaid for pain and GI md told her not to take nsaid due to gastric .Pt wife was told to ask the GI specialist what can he take ? Pt is waiting to have ultrasound. Please advise

## 2021-11-16 NOTE — Progress Notes (Signed)
History and Physical Interval Note: Seen 11/13/21 - no interval changes, see that note for details. Ongoing voice hoarseness, question if related to GERD or not. Changed from protonix to omeprazole twice daily recently. EGD to further evaluate.     11/16/2021 2:12 PM  Allen [name]  has presented today for endoscopic procedure(s), with the diagnosis of  Encounter Diagnosis  Name Primary?   Hoarseness of voice Yes  .  The various methods of evaluation and treatment have been discussed with the patient and/or family. After consideration of risks, benefits and other options for treatment, the patient has consented to  the endoscopic procedure(s).   The patient's history has been reviewed, patient examined, no change in status, stable for surgery.  I have reviewed the patient's chart and labs.  Questions were answered to the patient's satisfaction.    Jolly Mango, MD [facility]

## 2021-11-16 NOTE — Progress Notes (Signed)
Pt's states no medical or surgical changes since previsit or office visit. 

## 2021-11-16 NOTE — Patient Instructions (Addendum)
-  Patient has a contact number available for emergencies. The signs and symptoms of potential delayed complications were discussed with the patient. Return to normal activities tomorrow. Written discharge instructions were provided to the patient. - Resume previous diet. - Continue present medications (omeprazole twice daily) - Minimize NSAID use - Await pathology results.  YOU HAD AN ENDOSCOPIC PROCEDURE TODAY AT [facility]:   Refer to the procedure report that was given to you for any specific questions about what was found during the examination.  If the procedure report does not answer your questions, please call your gastroenterologist to clarify.  If you requested that your care partner not be given the details of your procedure findings, then the procedure report has been included in a sealed envelope for you to review at your convenience later.  YOU SHOULD EXPECT: Some feelings of bloating in the abdomen. Passage of more gas than usual.  Walking can help get rid of the air that was put into your GI tract during the procedure and reduce the bloating. If you had a lower endoscopy (such as a colonoscopy or flexible sigmoidoscopy) you may notice spotting of blood in your stool or on the toilet paper. If you underwent a bowel prep for your procedure, you may not have a normal bowel movement for a few days.  Please Note:  You might notice some irritation and congestion in your nose or some drainage.  This is from the oxygen used during your procedure.  There is no need for concern and it should clear up in a day or so.  SYMPTOMS TO REPORT IMMEDIATELY:  Following upper endoscopy (EGD)  Vomiting of blood or coffee ground material  New chest pain or pain under the shoulder blades  Painful or persistently difficult swallowing  New shortness of breath  Fever of 100F or higher  Black, tarry-looking stools  For urgent or emergent issues, a gastroenterologist can be reached at any  hour by calling [phone]. Do not use MyChart messaging for urgent concerns.    DIET:  We do recommend a small meal at first, but then you may proceed to your regular diet.  Drink plenty of fluids but you should avoid alcoholic beverages for 24 hours.  ACTIVITY:  You should plan to take it easy for the rest of today and you should NOT DRIVE or use heavy machinery until tomorrow (because of the sedation medicines used during the test).    FOLLOW UP: Our staff will call the number listed on your records the next business day following your procedure.  We will call around 7:15- 8:00 am to check on you and address any questions or concerns that you may have regarding the information given to you following your procedure. If we do not reach you, we will leave a message.     If any biopsies were taken you will be contacted by phone or by letter within the next 1-3 weeks.  Please call us at [phone] if you have not heard about the biopsies in 3 weeks.    SIGNATURES/CONFIDENTIALITY: You and/or your care partner have signed paperwork which will be entered into your electronic medical record.  These signatures attest to the fact that that the information above on your After Visit Summary has been reviewed and is understood.  Full responsibility of the confidentiality of this discharge information lies with you and/or your care-partner.

## 2021-11-16 NOTE — Telephone Encounter (Signed)
Appointment scheduled.

## 2021-11-16 NOTE — Op Note (Signed)
[place] Patient Name: Allen [name] Procedure Date: 11/16/2021 2:19 PM MRN: [id number] Endoscopist: Remo Lipps P. Havery Moros , MD, [id number] Age: 71 Referring MD:  Date of Birth: 06-Oct-1950 Gender: Male Account #: [id number] Procedure:                Upper GI endoscopy Indications:              Voice hoarseness, on protonix 4m / day for                            suspected LPR over time with continued symptoms -                            EGD to further evaluate, also screen for Barrett's Medicines:                Monitored Anesthesia Care Procedure:                Pre-Anesthesia Assessment:                           - Prior to the procedure, a History and Physical                            was performed, and patient medications and                            allergies were reviewed. The patient's tolerance of                            previous anesthesia was also reviewed. The risks                            and benefits of the procedure and the sedation                            options and risks were discussed with the patient.                            All questions were answered, and informed consent                            was obtained. Prior Anticoagulants: The patient has                            taken no anticoagulant or antiplatelet agents. ASA                            Grade Assessment: III - A patient with severe                            systemic disease. After reviewing the risks and                            benefits, the patient was deemed in satisfactory  condition to undergo the procedure.                           After obtaining informed consent, the endoscope was                            passed under direct vision. Throughout the                            procedure, the patient's blood pressure, pulse, and                            oxygen saturations were monitored continuously. The                             GIF [id number] #[id number] was introduced through the                            mouth, and advanced to the second part of duodenum.                            The upper GI endoscopy was accomplished without                            difficulty. The patient tolerated the procedure                            well. Scope In: Scope Out: Findings:                 Esophagogastric landmarks were identified: the                            Z-line was found at 42 cm, the gastroesophageal                            junction was found at 42 cm and the upper extent of                            the gastric folds was found at 42 cm from the                            incisors.                           The exam of the esophagus was otherwise normal. No                            erosive or inflammatory changes.                           Patchy inflammation characterized by erythema,                            friability and shallow ulcerations (mid gastric  body) were found in the gastric body and in the                            gastric antrum. Biopsies were taken with a cold                            forceps for Helicobacter pylori testing.                           The exam of the stomach was otherwise normal.                           The ampulla and examined duodenum were normal. Complications:            No immediate complications. Estimated blood loss:                            Minimal. Estimated Blood Loss:     Estimated blood loss was minimal. Impression:               - Esophagogastric landmarks identified.                           - Normal esophagus otherwise                           - Gastritis, with superficial gastric ulcerations                            in the mid body . Biopsied.                           - Normal ampulla and examined duodenum.                           No erosive changes. Given persistent symptoms                            despite PPI,  GERD may be less likely the cause of                            symptoms, although non-erosive reflux disease                            remains possible. Await trial of change in PPI                            (omeprazole 72m twice daily for 4-6 weeks), and if                            symptoms persist would recommend referral back to                            ENT and possible allergy for post-nasal drip. Can  consider 24 hour PH impedance test in the future if                            symptoms persist to more definitively assess for                            reflux. Recommendation:           - Patient has a contact number available for                            emergencies. The signs and symptoms of potential                            delayed complications were discussed with the                            patient. Return to normal activities tomorrow.                            Written discharge instructions were provided to the                            patient.                           - Resume previous diet.                           - Continue present medications (omeprazole twice                            daily)                           - Minimize NSAID use                           - Await pathology results. Remo Lipps P. Sheena Donegan, MD 11/16/2021 2:42:06 PM This report has been signed electronically.

## 2021-11-16 NOTE — Progress Notes (Signed)
Pt resting comfortably. VSS. Airway intact. SBAR complete to RN. All questions answered.

## 2021-11-16 NOTE — Telephone Encounter (Signed)
Patient has scheduled an appointment

## 2021-11-17 ENCOUNTER — Encounter: Payer: Self-pay | Admitting: Family

## 2021-11-17 ENCOUNTER — Ambulatory Visit (HOSPITAL_COMMUNITY)
Admission: RE | Admit: 2021-11-17 | Discharge: 2021-11-17 | Disposition: A | Discharge status: Home / Self Care | Payer: Medicare HMO | Source: Ambulatory Visit | Attending: Family | Admitting: Family

## 2021-11-17 ENCOUNTER — Telehealth: Payer: Self-pay | Admitting: Internal Medicine

## 2021-11-17 ENCOUNTER — Telehealth: Payer: Self-pay | Admitting: *Deleted

## 2021-11-17 ENCOUNTER — Ambulatory Visit (INDEPENDENT_AMBULATORY_CARE_PROVIDER_SITE_OTHER): Payer: Medicare HMO | Admitting: Family

## 2021-11-17 ENCOUNTER — Encounter: Payer: Self-pay | Admitting: Internal Medicine

## 2021-11-17 VITALS — BP 112/71 | HR 80 | Temp 97.6°F | Ht 70.0 in | Wt 216.2 lb

## 2021-11-17 DIAGNOSIS — I8002 Phlebitis and thrombophlebitis of superficial vessels of left lower extremity: Secondary | ICD-10-CM | POA: Diagnosis not present

## 2021-11-17 DIAGNOSIS — R490 Dysphonia: Secondary | ICD-10-CM

## 2021-11-17 NOTE — Telephone Encounter (Signed)
Caller states: -Patient is negative for DVT but positive for SVT   For additional questions, Danae Chen can be reached @ [phone].

## 2021-11-17 NOTE — Progress Notes (Signed)
Patient ID: Allen [name], male    DOB: January 27, 1950, 71 y.o.   MRN: [id number]  Chief Complaint  Patient presents with  . Leg Pain    Pt c/o left leg pain/swelling since Tuesday, Sharp pains in inner thigh. Has tried heat which did help. Was seen in Urgent care and was told to take advil but per GI he is not suppose to.     HPI:      Thrombophlebitis: left inner thigh, redness, and swollen vein. Seen by UC yesterday and dx with superficial thrombophlebitis and advised to get [place] asap to rule out deeper clot or other blockage. Pt has been applying heat with mild relief.  Assessment & Plan:  1. Thrombophlebitis of superficial veins of left lower extremity - sending [place] stat. Advised to continue applying heat up to 32mn tid, can also apply Voltaren gel qid, sample provided. pt has gastritis per ENDO done yesterday and oral NSAIDs contraindicated.  - [place] Img Lower Unilateral Left (DVT); Future   Subjective:    Outpatient Medications Prior to Visit  Medication Sig Dispense Refill  . Ascorbic Acid (VITAMIN C) 1000 MG tablet Take 1,000 mg by mouth in the morning.    .[name] Kitchenazelastine (ASTELIN) 0.1 % nasal spray Place 1-2 sprays into both nostrils 2 (two) times daily as needed for rhinitis. 30 mL 5  . buPROPion (WELLBUTRIN XL) 150 MG 24 hr tablet Take 150 mg by mouth every morning.    . Cholecalciferol (VITAMIN D3) 125 MCG (5000 UT) TABS Take 5,000 Units by mouth in the morning.    . Coenzyme Q10 300 MG CAPS Take 300 mg by mouth in the morning.    . escitalopram (LEXAPRO) 20 MG tablet Take 1 tablet (20 mg total) by mouth daily. (Patient taking differently: Take 20 mg by mouth at bedtime.) 90 tablet 1  . fluticasone (FLONASE) 50 MCG/ACT nasal spray Place 2 sprays into both nostrils daily as needed for allergies or rhinitis. 16 g 5  . Fluticasone-Umeclidin-Vilant (TRELEGY ELLIPTA) 100-62.5-25 MCG/ACT AEPB Inhale 1 puff into the lungs daily. 28 each 0  . MYRBETRIQ 50 MG TB24 tablet Take 50 mg by  mouth in the morning.    . Omega-3 Fatty Acids (FISH OIL PO) Take 2,200 mg by mouth in the morning.    .[name] Kitchenomeprazole (PRILOSEC) 40 MG capsule Take 1 capsule (40 mg total) by mouth 2 (two) times daily. 60 capsule 3  . primidone (MYSOLINE) 50 MG tablet TAKE 2 TABLETS IN THE MORNING AND TAKE 1 TABLET IN THE EVENING 90 tablet 0  . tadalafil (CIALIS) 20 MG tablet Take 1 tablet (20 mg total) by mouth daily as needed for erectile dysfunction. (Patient taking differently: Take 10 mg by mouth daily.) 90 tablet 3  . tamsulosin (FLOMAX) 0.4 MG CAPS capsule Take 0.4 mg by mouth in the morning.    .[name] KitchentiZANidine (ZANAFLEX) 2 MG tablet Take 1 tablet (2 mg total) by mouth 3 (three) times daily. 90 tablet 0  . traMADol (ULTRAM) 50 MG tablet Take 1 tablet (50 mg total) by mouth every 6 (six) hours as needed. 20 tablet 0  . traZODone (DESYREL) 100 MG tablet TAKE 1 TABLET AT BEDTIME 90 tablet 1  . TURMERIC PO Take 2,250 mg by mouth in the morning.    . zaleplon (SONATA) 10 MG capsule Take 10 mg by mouth at bedtime.     No facility-administered medications prior to visit.   Past Medical History:  Diagnosis Date  .  Anxiety   . Arthritis    neck -  . BPH with obstruction/lower urinary tract symptoms    urologist-- dr pace  . Cancer ([place])    basal cell to nose  . Centrilobular emphysema ([place])    followed by dr Halford Chessman  . 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followed by dr Halford Chessman  . Cough 07/2020   covid residual, no congestion  . DOE (dyspnea on exertion)    due to copd/ emphysema  . ED (erectile dysfunction)   . Essential tremor    neurologist--- dr tat----  bilateral upper extremities,  s/p DPS 12/ 2016  . GERD (gastroesophageal reflux disease)   . History of 2019 novel coronavirus disease (COVID-19) 07/18/2020   per pt postive home test 07-18-2020 w/ mild  symptoms per pt; pt stated had urgent care visit 07-24-2020 for sob/ cough with negative cxr (in care everywhere) ;  follow up pcp visit 08-03-2020 in epic  . Insomnia    . Mixed simple and mucopurulent chronic bronchitis ([place])   . OSA (obstructive sleep apnea)    followed by dr Halford Chessman---- no longer does uses bipap, intolerant --- pt prescriped  Oxygen  2 L oxygen via [place] while sleeping  (study in epic 04/ 2017 [facility] 40)  . Perennial allergic rhinitis   . Pneumonia   . Pulmonary nodules    followed by dr Halford Chessman  . S/P deep brain stimulator placement 12/2014   followed by neurology-----IPG change 01-21-2020 for end-of life battery  (pt has a control)  . Varicose vein of leg    per pt has had 3 procedure's last one 2006  . Wears dentures    upper   Past Surgical History:  Procedure Laterality Date  . ANKLE SURGERY Right 2020   tendon repair  . APPENDECTOMY    . CARPAL TUNNEL RELEASE Left 2019  . CATARACT EXTRACTION W/ INTRAOCULAR LENS IMPLANT Bilateral 2020  . COLONOSCOPY    . CYSTOSCOPY WITH URETHRAL DILATATION N/A 08/29/2021   Procedure: CYSTOSCOPY WITH URETHRAL DILATATION;  Surgeon: Robley Fries, MD;  Location: [facility];  Service: Urology;  Laterality: N/A;  . DEEP BRAIN STIMULATOR PLACEMENT Left 2016  . INGUINAL HERNIA REPAIR Right 2007  . ROTATOR CUFF REPAIR Right 2014  . SUBTHALAMIC STIMULATOR BATTERY REPLACEMENT N/A 01/21/2020   Procedure: Deep brain stimulator battery replacement;  Surgeon: Erline Levine, MD;  Location: [facility];  Service: Neurosurgery;  Laterality: N/A;  . TONSILLECTOMY AND ADENOIDECTOMY     age 36  . TOTAL SHOULDER REPLACEMENT Left 2016  . TRANSURETHRAL RESECTION OF PROSTATE N/A 08/09/2020   Procedure: TRANSURETHRAL RESECTION OF THE PROSTATE (TURP), BLADDER BIOPSY AND FULGERATION;  Surgeon: Robley Fries, MD;  Location: [facility];  Service: Urology;  Laterality: N/A;  . TRANSURETHRAL RESECTION OF PROSTATE N/A 08/29/2021   Procedure: RE-TRANSURETHRAL RESECTION OF THE PROSTATE (TURP);  Surgeon: Robley Fries, MD;  Location: [facility];  Service: Urology;  Laterality: N/A;  . VEIN SURGERY Left 2006   varicose  veins   Allergies  Allergen Reactions  . Copper-Containing Compounds     Copper gloves caused redness and blisters bilateral hands      Objective:    Physical Exam Vitals and nursing note reviewed.  Constitutional:      General: He is not in acute distress.    Appearance: Normal appearance.  HENT:     Head: Normocephalic.  Cardiovascular:     Rate and Rhythm: Normal rate and regular rhythm.  Pulmonary:     Effort: Pulmonary effort is normal.     Breath sounds: Normal breath sounds.  Musculoskeletal:        General: Normal range of motion.     Cervical back: Normal range of motion.     Left lower leg: Swelling (mildly swollen superficial vein, left inner thigh, with mild erythema) present.       Legs:  Skin:    General: Skin is warm and dry.  Neurological:     Mental Status: He is alert and oriented to person, place, and time.  Psychiatric:        Mood and Affect: Mood normal.  BP 112/71 (BP Location: Left Arm, Patient Position: Sitting, Cuff Size: Large)   Pulse 80   Temp 97.6 F (36.4 C) (Temporal)   Ht 5' 10"  (1.778 m)   Wt 216 lb 3.2 oz (98.1 kg)   SpO2 96%   BMI 31.02 kg/m  Wt Readings from Last 3 Encounters:  11/17/21 216 lb 3.2 oz (98.1 kg)  11/16/21 217 lb (98.4 kg)  11/15/21 215 lb (97.5 kg)       Jeanie Sewer, NP

## 2021-11-17 NOTE — Telephone Encounter (Signed)
Date of birth verified by patient   results given  Ultrasound was negative for DVT, confirmed his diagnosis of superficial venous thrombophlebitis. He should continue the plan of care we discussed this morning. Pt verbalized understanding

## 2021-11-17 NOTE — Patient Instructions (Signed)
It was very nice to see you today!   I have sent a stat Ultrasound order over to our imaging center on [facility].- they will call you directly and they may send you to a different location.  Continue to apply heat to your leg up to 20 minutes at a time several times per day.  You can take Arthritis strength tylenol every 6 hours as needed for pain and apply a small amount of the Voltaren gel 4 times per day.  I will send a message via Mychart with the results or via phone call.    PLEASE NOTE:  If you had any lab tests please let us know if you have not heard back within a few days. You may see your results on MyChart before we have a chance to review them but we will give you a call once they are reviewed by [place]. If we ordered any referrals today, please let us know if you have not heard from their office within the next week.

## 2021-11-17 NOTE — Addendum Note (Signed)
Addended byJeanie Sewer on: 11/17/2021 09:31 AM   Modules accepted: Orders

## 2021-11-17 NOTE — Telephone Encounter (Signed)
  Follow up Call-     11/16/2021    1:34 PM 03/24/2021    8:59 AM  Call back number  Post procedure Call Back phone  # [phone]  Permission to leave phone message Yes      Patient questions:  Do you have a fever, pain , or abdominal swelling? No. Pain Score  0 *  Have you tolerated food without any problems? Yes.    Have you been able to return to your normal activities? Yes.    Do you have any questions about your discharge instructions: Diet   No. Medications  No. Follow up visit  No.  Do you have questions or concerns about your Care? No.  Actions: * If pain score is 4 or above: No action needed, pain <4.

## 2021-11-17 NOTE — Progress Notes (Signed)
Please call Allen [name] and let him know the Ultrasound was negative for DVT, confirmed his diagnosis of superficial venous thrombophlebitis. He should continue the plan of care we discussed this morning.  Thanks

## 2021-11-22 DIAGNOSIS — F901 Attention-deficit hyperactivity disorder, predominantly hyperactive type: Secondary | ICD-10-CM | POA: Diagnosis not present

## 2021-11-29 DIAGNOSIS — F901 Attention-deficit hyperactivity disorder, predominantly hyperactive type: Secondary | ICD-10-CM | POA: Diagnosis not present

## 2021-12-06 DIAGNOSIS — F901 Attention-deficit hyperactivity disorder, predominantly hyperactive type: Secondary | ICD-10-CM | POA: Diagnosis not present

## 2021-12-21 DIAGNOSIS — F410 Panic disorder [episodic paroxysmal anxiety] without agoraphobia: Secondary | ICD-10-CM | POA: Diagnosis not present

## 2021-12-21 DIAGNOSIS — F411 Generalized anxiety disorder: Secondary | ICD-10-CM | POA: Diagnosis not present

## 2021-12-25 ENCOUNTER — Telehealth: Payer: Self-pay | Admitting: Pharmacist

## 2021-12-25 NOTE — Chronic Care Management (AMB) (Signed)
    Chronic Care Management Pharmacy Assistant   Name: [name]  MRN: [id number] DOB: 08/31/1950  12/26/2021 APPOINTMENT REMINDER  Allen [name] was reminded to have all medications, supplements and any blood glucose and blood pressure readings available for review with Jeni Salles, Pharm. D, at his telephone visit on 12/26/2021 at 12:00.  Care Gaps: AWV - 05/17/21 message sent to [name] Last BP - 112/71 on 11/17/2021 Last A1C - 5.9 on 07/20/2021 Hep C Screen - never done Pnuemonia - overdue AWV - overdue Covid - overdue  Star Rating Drug: None  Any gaps in medications fill history? No  [name]  Catering manager [phone]

## 2021-12-26 ENCOUNTER — Ambulatory Visit (INDEPENDENT_AMBULATORY_CARE_PROVIDER_SITE_OTHER): Payer: Medicare HMO | Admitting: Pharmacist

## 2021-12-26 DIAGNOSIS — F339 Major depressive disorder, recurrent, unspecified: Secondary | ICD-10-CM

## 2021-12-26 DIAGNOSIS — N138 Other obstructive and reflux uropathy: Secondary | ICD-10-CM

## 2021-12-26 NOTE — Patient Instructions (Addendum)
Hi Allen [name],  Congrats again on the weight loss! That's such exciting news! Don't forget to ask your urologist about stopping the Flomax and the ENT doctor about stopping the omeprazole as it's not helping.  I do also think it would be worth doing a trial run without the trazodone because it's probably not helping much and it could be contributing to some of the side effects we discussed.  Please reach out to me if you have any questions or need anything before our follow up!  Best, Allen [name]  Allen [name], PharmD, [facility]   Visit Information   Goals Addressed   None    Patient Care Plan: CCM Pharmacy Care Plan     Problem Identified: Problem: GERD, COPD, Depression, Allergic Rhinitis, and Tremor, Pain, Insomnia      Long-Range Goal: Patient-Specific Goal   Start Date: 03/20/2021  Expected End Date: 03/21/2022  Recent Progress: On track  Priority: High  Note:   Current Barriers:  Unable to independently monitor therapeutic efficacy  Pharmacist Clinical Goal(s):  Patient will achieve adherence to monitoring guidelines and medication adherence to achieve therapeutic efficacy through collaboration with PharmD and provider.   Interventions: 1:1 collaboration with Isaac Bliss, Rayford Halsted, MD regarding development and update of comprehensive plan of care as evidenced by provider attestation and co-signature Inter-disciplinary care team collaboration (see longitudinal plan of care) Comprehensive medication review performed; medication list updated in electronic medical record  COPD (Goal: control symptoms and prevent exacerbations) -Controlled -Current treatment  Trelegy 100-62.5-25 mcg/act 1 puff daily - Appropriate, Effective, Safe, Accessible Albuterol HFA as needed - Appropriate, Effective, Safe, Accessible Albuterol nebulizer as needed - Appropriate, Effective, Safe, Accessible -Medications previously tried:  Breo  -Gold Grade: Gold 2 (FEV1 50-79%) -Current COPD Classification:  B (high sx, <2 exacerbations/yr) -MMRC/CAT score: 3 -Pulmonary function testing: 2018 -Exacerbations requiring treatment in last 6 months: none -Patient reports consistent use of maintenance inhaler -Frequency of rescue inhaler use: 1-2 times a day during allergy season -Counseled on Benefits of consistent maintenance inhaler use When to use rescue inhaler -Counseled on diet and exercise extensively Recommended to continue current medication  Allergic rhinitis (Goal: minimize symptoms) -Controlled -Current treatment  Hydroxyzine 25 mg 1 tablet twice daily - Appropriate, Query effective, Safe, Accessible Azelastine 0.1% nasal spray 1-2 sprays in each nostril as needed - Appropriate, Effective, Safe, Accessible Flonase 50 mcg/act  2 sprays in both nostrils as needed  - Appropriate, Effective, Safe, Accessible -Medications previously tried: montelukast (ineffective) - Recommended Zyrtec or Claritin daily during allergy season.  Depression (Goal: minimize symptoms) -Controlled -Current treatment: Escitalopram 20 mg 1 tablet daily - Appropriate, Effective, Safe, Accessible Bupropion XL 150 mg 1 tablet daily - Appropriate, Effective, Safe, Accessible  -Medications previously tried/failed: unknown -PHQ9: 5 -GAD7: n/a -Educated on Benefits of medication for symptom control Benefits of cognitive-behavioral therapy with or without medication -Recommended to continue current medication  Insomnia (Goal: improve quality and quantity of sleep) -Controlled -Current treatment  Trazodone 100 mg 1 tablet at bedtime - Appropriate, Effective, Safe, Accessible Sonata 10 mg 1 capsule at bedtime as needed - Appropriate, Effective, Query Safe, Accessible -Medications previously tried: Zolpidem (ineffective) -Counseled on practicing good sleep hygiene by setting a sleep schedule and maintaining it, avoid excessive napping,  following a nightly routine, avoiding screen time for 30-60 minutes before going to bed, and making the bedroom a cool, quiet and dark space Recommended trial without trazodone given lack of efficacy alone  GERD (Goal: minimize symptoms) -Not ideally controlled -Current treatment  Omeprazole 40 mg 1 capsule twice daily - Appropriate, Query effective, Safe, Accessible -Medications previously tried: pantoprazole (switched to omeprazole) -Counseled on non-pharmacologic management of symptoms such as elevating the head of your bed, avoiding eating 2-3 hours before bed, avoiding triggering foods such as acidic, spicy, or fatty foods, eating smaller meals, and wearing clothes that are loose around the waist Recommended Pepcid or Tums as needed for days that are worse.  Tremor (Goal: minimize symptoms) -Controlled -Current treatment  Primidone 50 mg 2 tablets in the morning 1 tablet in the evening - Appropriate, Effective, Safe, Accessible -Medications previously tried: none  -Recommended to continue current medication  BPH/overactive bladder (Goal: improve urinary flow and limit frequency) -Controlled -Current treatment  Tamsulosin 0.4 mg 1 capsule daily - Appropriate, Query effective, Safe, Accessible Myrbetriq 25 mg 1 tablet daily - Appropriate, Query effective, Safe, Query accessible -Medications previously tried: silodosin (ineffective) -Recommended to continue current medication Recommended discussing stopping tamsulosin with urology.  Health Maintenance -Vaccine gaps: Prevnar20 -Current therapy:  Folic acid 683 mg 1 tablet daily Tadalafil 10 mg 1 tablet daily Vitamin D 2000 units daily Vitamin B12 1000 mcg daily  -Educated on Cost vs benefit of each product must be carefully weighed by individual consumer -Patient is satisfied with current therapy and denies issues -Recommended to continue current medication  Patient Goals/Self-Care Activities Patient will:  - take medications  as prescribed as evidenced by patient report and record review target a minimum of 150 minutes of moderate intensity exercise weekly  Follow Up Plan: Telephone follow up appointment with care management team member scheduled for: 6 months       Patient verbalizes understanding of instructions and care plan provided today and agrees to view in [facility]. Active MyChart status and patient understanding of how to access instructions and care plan via MyChart confirmed with patient.    Telephone follow up appointment with pharmacy team member scheduled for: 6 months  Viona Gilmore, [facility]

## 2021-12-26 NOTE — Progress Notes (Signed)
Chronic Care Management Pharmacy Note  12/26/2021 Name:  Sabatino Williard MRN:  [id number] DOB:  03-14-1950  Summary: Pt reports side effects with some medications  Recommendations/Changes made from today's visit: -Recommended discussing with urology about stopping tamsulosin given possible side effects and post Turp procedure -Recommended trial without trazodone given lack of efficacy as a single agent  -Recommended stopping PPI as this did not improve symptoms  Plan: COPD assessment in 3 months Follow up in 6 months  Subjective: Drury Ardizzone is an 71 y.o. year old male who is a primary patient of Isaac Bliss, Rayford Halsted, MD.  The CCM team was consulted for assistance with disease management and care coordination needs.    Engaged with patient by telephone for follow up visit  in response to provider referral for pharmacy case management and/or care coordination services.   Consent to Services:  The patient was given information about Chronic Care Management services, agreed to services, and gave verbal consent prior to initiation of services.  Please see initial visit note for detailed documentation.   Patient Care Team: Isaac Bliss, Rayford Halsted, MD as PCP - General (Internal Medicine) Buford Dresser, MD as PCP - Cardiology (Cardiology) Tat, Eustace Quail, DO as Consulting Physician (Neurology) Viona Gilmore, [facility] as Pharmacist (Pharmacist) Lavonna Monarch, MD (Inactive) as Consulting Physician (Dermatology)  Recent office visits: 07/20/21 Lelon Frohlich, MD : Patient presented for annual exam. No medication changes.  05/31/21 Estela Isaac Bliss, MD : Patient presented for hernia and weight loss. Referred to general surgery.  Recent consult visits: 11/22/21 Anjanette Tschupp (internal medicine): Patient presented for ADHD psychotherapy. Unable to access notes.  11/17/21 Jeanie Sewer, NP ([place]): Patient presented for leg pain. Plan for  ultrasound.   11/15/21 Sharene Butters, PA-C (neurology): Patient presented for memory difficulties. Plan for neuropsychological testing. Recommend discussing with GI other agents than omeprazole for his GERD as there is evidence that prolonged use can have an effect on memory.  11/13/21 Ramona Cellar, MD (gastro): Patient presented for GERD follow up. Switched pantoprazole to omeprazole 40 mg BID x 4-6 weeks. Plan for EGD.  11/06/21 Acquanetta Chain (psychiatry): Patient presented for GAD and ADHD follow up. Unable to access notes.  11/07/21 Chesley Mires, MD (pulmonary): Patient presented for hoarseness follow up.  Switched to low dose Trelegy. Follow up in 6 months.  11/06/21 Gardiner Barefoot, DPM (podiatry): Patient presented for nail trim.  09/28/2021 Sharene Butters PA-C (neurology) - Patient was seen for memory loss and additional concerns. No medication changes. Follow up in 1 month.    09/21/2021 Lenord Carbo (anesthesiology) - Patient was seen for Spondylosis without myelopathy or radiculopathy, cervical region. No additional chart notes.    09/19/2021 Anjanette Tschupp (internal med) - Patient was seen for Major depressive disorder, recurrent, moderate. No additional chart notes.    09/14/2021 Daniel Nones (orthopedic) - Patient was seen for pain in right hand. No additional chart notes.    09/14/2021 Aleene Davidson (occupational med) - Patient was seen for pain in right fingers. No additional chart notes.    08/16/2021 Acquanetta Chain (psychiatry) - Patient was seen for Major depressive disorder, recurrent, moderate and generalized anxiety disorder. No additional chart notes.   08/07/21 Troy Dawley, DO (neurosurgery): Patient presented for facet arthropathy. Unable to access notes.  07/31/21 Alonza Bogus, DO (neurology): Patient presented for tremor follow up. Prescribed topamax 25 mg daily x 1 week, then 50 mg/day x 1 week, 75 mg/day x 1 week, then 100  mg daily.  07/27/21 Lavonna Monarch, MD (dermatology):  Patient presented for solar purpura and destruction of squamous cell lesion. Recommended OTC Dermend.  07/25/21 Jamelle Haring, MD (vein and vascular): Patient presented for initial visit for chronic venous insufficiency.  07/10/21 Patient presented for sleep study.  Hospital visits: Admitted to [facility] on 08/29/2021 (4 hours) due to re-transurethral resection of the prostate.    New?Medications Started at [facility] Discharge:?? cephALEXin (KEFLEX) traMADol (Ultram) Medication Changes at Hospital Discharge: No medication changes.  Medications Discontinued at Hospital Discharge: topiramate 100 MG tablet (TOPAMAX) topiramate 25 MG tablet (Topamax)   Medications that remain the same after Hospital Discharge:??  -All other medications will remain the same.    Objective:  Lab Results  Component Value Date   CREATININE 1.21 08/22/2021   BUN 15 08/22/2021   GFR 62.94 07/20/2021   GFRNONAA >60 08/22/2021   NA 139 08/22/2021   K 4.8 08/22/2021   CALCIUM 9.6 08/22/2021   CO2 26 08/22/2021   GLUCOSE 110 (H) 08/22/2021    Lab Results  Component Value Date/Time   HGBA1C 5.9 07/20/2021 10:10 AM   HGBA1C 5.9 06/15/2020 02:04 PM   GFR 62.94 07/20/2021 10:10 AM   GFR 66.13 06/15/2020 02:04 PM    Last diabetic Eye exam: No results found for: "HMDIABEYEEXA"  Last diabetic Foot exam: No results found for: "HMDIABFOOTEX"   Lab Results  Component Value Date   CHOL 194 07/20/2021   HDL 67.20 07/20/2021   LDLCALC 96 07/20/2021   TRIG 154.0 (H) 07/20/2021   CHOLHDL 3 07/20/2021       Latest Ref Rng & Units 07/20/2021   10:10 AM 06/15/2020    2:04 PM 03/08/2020   12:03 PM  Hepatic Function  Total Protein 6.0 - 8.3 g/dL 6.7  6.8  6.7   Albumin 3.5 - 5.2 g/dL 4.5  4.5  4.3   AST 0 - 37 U/L _0 ALT 0 - 53 U/L _1 Alk Phosphatase 39 - 117 U/L 46  61  52   Total Bilirubin 0.2 - 1.2 mg/dL 0.5  0.3  0.3     Lab Results  Component Value Date/Time   TSH 1.95  07/20/2021 10:10 AM   TSH 1.69 06/15/2020 02:04 PM       Latest Ref Rng & Units 08/22/2021    9:06 AM 07/20/2021   10:10 AM 06/15/2020    2:04 PM  CBC  WBC 4.0 - 10.5 K/uL 6.7  7.2  8.1   Hemoglobin 13.0 - 17.0 g/dL 13.1  12.9  13.3   Hematocrit 39.0 - 52.0 % 40.8  39.2  40.1   Platelets 150 - 400 K/uL 277  284.0  295.0     Lab Results  Component Value Date/Time   VD25OH 38.83 07/20/2021 10:10 AM   VD25OH 33.85 06/15/2020 02:04 PM    Clinical ASCVD: No  The 10-year ASCVD risk score (Arnett DK, et al., 2019) is: 13.6%   Values used to calculate the score:     Age: 49 years     Sex: Male     Is Non-Hispanic African American: No     Diabetic: No     Tobacco smoker: No     Systolic Blood Pressure: 973 mmHg     Is BP treated: No     HDL Cholesterol: 67.2 mg/dL     Total Cholesterol: 194 mg/dL  11/17/2021    8:30 AM 07/20/2021   10:10 AM 05/31/2021    8:27 AM  Depression screen PHQ 2/9  Decreased Interest 0 1 1  Down, Depressed, Hopeless 0 2 0  PHQ - 2 Score 0 3 1  Altered sleeping 0 1 2  Tired, decreased energy 0 2 2  Change in appetite 0 0 0  Feeling bad or failure about yourself  0 0 0  Trouble concentrating 0 0 0  Moving slowly or fidgety/restless 0 0 0  Suicidal thoughts 0 0 0  PHQ-9 Score 0 6 5  Difficult doing work/chores Not difficult at all Not difficult at all Not difficult at all    [facility] Dyspnea Scale mMRC Score  04/27/2019  1:38 PM 3  04/10/2019  9:02 AM 0    Social History   Tobacco Use  Smoking Status Former   Packs/day: 3.00   Years: 20.00   Total pack years: 60.00   Types: Cigarettes   Quit date: 1991   Years since quitting: 32.9  Smokeless Tobacco Never   BP Readings from Last 3 Encounters:  11/17/21 112/71  11/16/21 (!) 153/82  11/15/21 116/74   Pulse Readings from Last 3 Encounters:  11/17/21 80  11/16/21 61  11/15/21 98   Wt Readings from Last 3 Encounters:  11/17/21 216 lb 3.2 oz (98.1 kg)  11/16/21 217 lb (98.4 kg)   11/15/21 215 lb (97.5 kg)   BMI Readings from Last 3 Encounters:  11/17/21 31.02 kg/m  11/16/21 31.14 kg/m  11/15/21 30.85 kg/m    Assessment/Interventions: Review of patient past medical history, allergies, medications, health status, including review of consultants reports, laboratory and other test data, was performed as part of comprehensive evaluation and provision of chronic care management services.   SDOH:  (Social Determinants of Health) assessments and interventions performed: Yes (last 03/20/21) SDOH Interventions    Flowsheet Row Chronic Care Management from 03/20/2021 in [place] [facility] Walk Test from 09/01/2019 in [facility] [place] Interventions    Transportation Interventions Intervention Not Indicated --  Depression Interventions/Treatment  -- Counseling  Financial Strain Interventions Intervention Not Indicated --       SDOH Screenings   Transportation Needs: No Transportation Needs (03/20/2021)  Depression (PHQ2-9): Low Risk  (11/17/2021)  Financial Resource Strain: Low Risk  (03/20/2021)  Tobacco Use: Medium Risk (11/17/2021)    CCM Care Plan  Allergies  Allergen Reactions   Copper-Containing Compounds     Copper gloves caused redness and blisters bilateral hands    Medications Reviewed Today     Reviewed by Viona Gilmore, [facility] (Pharmacist) on 12/26/21 at 1217  Med List Status: <None>   Medication Order Taking? Sig Documenting Provider Last Dose Status Informant  Ascorbic Acid (VITAMIN C) 1000 MG tablet [id number]  Take 1,000 mg by mouth in the morning. [provider]  Active Self           Med Note Liliane Bade Aug 17, 2021  1:38 PM) On hold due to upcoming procedure.   azelastine (ASTELIN) 0.1 % nasal spray [id number]  Place 1-2 sprays into both nostrils 2 (two) times daily as needed for rhinitis. Valentina Shaggy, MD  Active Self            Med Note Kenton Kingfisher, Nani Skillern Aug 17, 2021  1:33 PM) During March & April   buPROPion [facility]  XL) 150 MG 24 hr tablet [id number]  Take 150 mg by mouth every morning. [provider]  Active Self  Cholecalciferol (VITAMIN D3) 125 MCG (5000 UT) TABS [id number]  Take 5,000 Units by mouth in the morning. [provider]  Active Self           Med Note Kipp Brood, Jaylee Lantry G   Tue Dec 26, 2021 12:06 PM)    Coenzyme Q10 300 MG CAPS [id number]  Take 300 mg by mouth in the morning. [provider]  Active Self           Med Note Kipp Brood, Aphrodite Harpenau G   Tue Dec 26, 2021 12:06 PM)    escitalopram (LEXAPRO) 20 MG tablet [id number]  Take 1 tablet (20 mg total) by mouth daily.  Patient taking differently: Take 20 mg by mouth at bedtime.   Isaac Bliss, Rayford Halsted, MD  Active Self  fluticasone [facility]) 50 MCG/ACT nasal spray [id number]  Place 2 sprays into both nostrils daily as needed for allergies or rhinitis. Valentina Shaggy, MD  Active Self           Med Note Kenton Kingfisher, Nani Skillern Aug 17, 2021  1:33 PM) During March & April    Fluticasone-Umeclidin-[facility] ELLIPTA) 100-62.5-25 MCG/ACT AEPB [id number] Yes Inhale 1 puff into the lungs daily. Chesley Mires, MD Taking Active   MYRBETRIQ 50 MG TB24 tablet [id number]  Take 50 mg by mouth in the morning. [provider]  Active Self  Omega-3 Fatty Acids (FISH OIL PO) [id number]  Take 2,200 mg by mouth in the morning. [provider]  Active Self           Med Note Kipp Brood, Deren Degrazia G   Tue Dec 26, 2021 12:06 PM)    omeprazole (PRILOSEC) 40 MG capsule [id number] Yes Take 1 capsule (40 mg total) by mouth 2 (two) times daily. Yetta Flock, MD Taking Active   primidone (MYSOLINE) 50 MG tablet [id number]  TAKE 2 TABLETS IN THE MORNING AND TAKE 1 TABLET IN THE EVENING Tat, Eustace Quail, DO  Active   tadalafil (CIALIS) 20 MG tablet [id number]  Take 1 tablet (20 mg total) by mouth daily as needed for erectile  dysfunction.  Patient taking differently: Take 10 mg by mouth daily.   Isaac Bliss, Rayford Halsted, MD  Active Self  tamsulosin [facility]) 0.4 MG CAPS capsule [id number] Yes Take 0.4 mg by mouth in the morning. [provider] Taking Active Self  traZODone (DESYREL) 100 MG tablet [id number]  TAKE 1 TABLET AT BEDTIME Isaac Bliss, Rayford Halsted, MD  Active Self  TURMERIC PO [id number]  Take 2,250 mg by mouth in the morning. [provider]  Active Self           Med Note Kipp Brood, Antionne Enrique G   Tue Dec 26, 2021 12:10 PM)    zaleplon (SONATA) 10 MG capsule [id number] Yes Take 10 mg by mouth at bedtime. [provider] Taking Active Self            Patient Active Problem List   Diagnosis Date Noted   MCI (mild cognitive impairment) with memory loss 09/29/2021   Chronic respiratory failure with hypoxia ([place]) 09/01/2020   BPH with obstruction/lower urinary tract symptoms 08/09/2020   S/P deep brain stimulator placement 03/25/2020   Tremor 03/25/2020   Pulmonary nodule less than 6 mm in diameter with high risk for malignant neoplasm 03/09/2020   Injury of axilla 03/03/2020   Body  mass index (BMI) 32.0-32.9, adult 02/22/2020   Cervical radiculopathy 02/22/2020   Elevated blood-pressure reading, without diagnosis of hypertension 02/22/2020   Neck pain 02/22/2020   Greater trochanteric bursitis of right hip 11/18/2019   Degenerative disc disease, cervical 07/01/2019   Degenerative disc disease, lumbar 07/01/2019   Nonallopathic lesion of lumbar region 07/01/2019   Varicose veins of both lower extremities 06/23/2019   Chronic diastolic CHF (congestive heart failure) ([place]) 06/02/2019   Perennial allergic rhinitis probable nonallergic component 06/01/2019   Cough, persistent 06/01/2019   Vitamin D deficiency 05/07/2019   Moderate persistent asthma without complication 01/77/9390   Allergic rhinitis 04/10/2019   Shortness of breath 04/10/2019   GERD (gastroesophageal  reflux disease)    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OSA treated with BiPAP    Right carpal tunnel syndrome    Essential tremor    Insomnia    ED (erectile dysfunction)    Ulcerative colitis ([place]) 06/30/2014    Immunization History  Administered Date(s) Administered   Fluad Quad(high Dose 65+) 09/18/2018, 11/10/2019   Influenza, High Dose Seasonal PF 11/01/2020   Influenza-Unspecified 10/06/2021   PFIZER Comirnaty(Gray Top)Covid-19 Tri-Sucrose Vaccine 10/06/2021   PFIZER(Purple Top)SARS-COV-2 Vaccination 02/20/2019, 03/17/2019, 06/02/2019, 10/03/2019, 11/01/2020   Pneumococcal Polysaccharide-23 01/08/2015   Pneumococcal-Unspecified 12/15/2017   Tdap 09/17/2016   Zoster Recombinat (Shingrix) 06/06/2019, 09/05/2019   Patient has lost about 50 lbs since July with nutrisystem and he is so proud of this accomplishment. He goes to the gym and is using a bike at home as well. Patient had sleep apnea and doesn't need a CPAP anymore. He can touch his toes now and can get off the floor now by himself and this is a huge improvement from before.  Patient reports this switch to the PPI has not helped at all. He recommended seeing an ENT and will ask them about stopping the omeprazole. Patient reports he is getting worse with time and he doesn't any symptoms of GERD. He has had this for years and his voice.   Patient reports he is having some issues with sexual dysfunction and thinks it could be related to medications. For his list, most likely contributors would be the Lexapro and trazodone and possibly primidone and tamsulosin. Patient will discuss with specialists about a trial off some of these to see if it's all compounding and making things worse. He does also note more problems post Turp procedure and thought this could be the reason for it.   Conditions to be addressed/monitored:  GERD, COPD, Depression, BPH, Allergic Rhinitis, and Tremor, Insomnia  Conditions addressed this visit: Depression, BPH,  GERD  Care Plan : CCM Pharmacy Care Plan  Updates made by Viona Gilmore, [place] since 12/26/2021 12:00 AM     Problem: Problem: GERD, COPD, Depression, Allergic Rhinitis, and Tremor, Pain, Insomnia      Long-Range Goal: Patient-Specific Goal   Start Date: 03/20/2021  Expected End Date: 03/21/2022  Recent Progress: On track  Priority: High  Note:   Current Barriers:  Unable to independently monitor therapeutic efficacy  Pharmacist Clinical Goal(s):  Patient will achieve adherence to monitoring guidelines and medication adherence to achieve therapeutic efficacy through collaboration with PharmD and provider.   Interventions: 1:1 collaboration with Isaac Bliss, Rayford Halsted, MD regarding development and update of comprehensive plan of care as evidenced by provider attestation and co-signature Inter-disciplinary care team collaboration (see longitudinal plan of care) Comprehensive medication review performed; medication list updated in electronic medical record  COPD (Goal:  control symptoms and prevent exacerbations) -Controlled -Current treatment  Trelegy 100-62.5-25 mcg/act 1 puff daily - Appropriate, Effective, Safe, Accessible Albuterol HFA as needed - Appropriate, Effective, Safe, Accessible Albuterol nebulizer as needed - Appropriate, Effective, Safe, Accessible -Medications previously tried: Breo  -Gold Grade: Gold 2 (FEV1 50-79%) -Current COPD Classification:  B (high sx, <2 exacerbations/yr) -MMRC/CAT score: 3 -Pulmonary function testing: 2018 -Exacerbations requiring treatment in last 6 months: none -Patient reports consistent use of maintenance inhaler -Frequency of rescue inhaler use: 1-2 times a day during allergy season -Counseled on Benefits of consistent maintenance inhaler use When to use rescue inhaler -Counseled on diet and exercise extensively Recommended to continue current medication  Allergic rhinitis (Goal: minimize symptoms) -Controlled -Current  treatment  Hydroxyzine 25 mg 1 tablet twice daily - Appropriate, Query effective, Safe, Accessible Azelastine 0.1% nasal spray 1-2 sprays in each nostril as needed - Appropriate, Effective, Safe, Accessible Flonase 50 mcg/act  2 sprays in both nostrils as needed  - Appropriate, Effective, Safe, Accessible -Medications previously tried: montelukast (ineffective) - Recommended Zyrtec or Claritin daily during allergy season.  Depression (Goal: minimize symptoms) -Controlled -Current treatment: Escitalopram 20 mg 1 tablet daily - Appropriate, Effective, Safe, Accessible Bupropion XL 150 mg 1 tablet daily - Appropriate, Effective, Safe, Accessible  -Medications previously tried/failed: unknown -PHQ9: 5 -GAD7: n/a -Educated on Benefits of medication for symptom control Benefits of cognitive-behavioral therapy with or without medication -Recommended to continue current medication  Insomnia (Goal: improve quality and quantity of sleep) -Controlled -Current treatment  Trazodone 100 mg 1 tablet at bedtime - Appropriate, Effective, Safe, Accessible Sonata 10 mg 1 capsule at bedtime as needed - Appropriate, Effective, Query Safe, Accessible -Medications previously tried: Zolpidem (ineffective) -Counseled on practicing good sleep hygiene by setting a sleep schedule and maintaining it, avoid excessive napping, following a nightly routine, avoiding screen time for 30-60 minutes before going to bed, and making the bedroom a cool, quiet and dark space Recommended trial without trazodone given lack of efficacy alone  GERD (Goal: minimize symptoms) -Not ideally controlled -Current treatment  Omeprazole 40 mg 1 capsule twice daily - Appropriate, Query effective, Safe, Accessible -Medications previously tried: pantoprazole (switched to omeprazole) -Counseled on non-pharmacologic management of symptoms such as elevating the head of your bed, avoiding eating 2-3 hours before bed, avoiding triggering foods  such as acidic, spicy, or fatty foods, eating smaller meals, and wearing clothes that are loose around the waist Recommended Pepcid or Tums as needed for days that are worse.  Tremor (Goal: minimize symptoms) -Controlled -Current treatment  Primidone 50 mg 2 tablets in the morning 1 tablet in the evening - Appropriate, Effective, Safe, Accessible -Medications previously tried: none  -Recommended to continue current medication  BPH/overactive bladder (Goal: improve urinary flow and limit frequency) -Controlled -Current treatment  Tamsulosin 0.4 mg 1 capsule daily - Appropriate, Query effective, Safe, Accessible Myrbetriq 25 mg 1 tablet daily - Appropriate, Query effective, Safe, Query accessible -Medications previously tried: silodosin (ineffective) -Recommended to continue current medication Recommended discussing stopping tamsulosin with urology.  Health Maintenance -Vaccine gaps: Prevnar20 -Current therapy:  Folic acid 480 mg 1 tablet daily Tadalafil 10 mg 1 tablet daily Vitamin D 2000 units daily Vitamin B12 1000 mcg daily  -Educated on Cost vs benefit of each product must be carefully weighed by individual consumer -Patient is satisfied with current therapy and denies issues -Recommended to continue current medication  Patient Goals/Self-Care Activities Patient will:  - take medications as prescribed as evidenced by patient report and  record review target a minimum of 150 minutes of moderate intensity exercise weekly  Follow Up Plan: Telephone follow up appointment with care management team member scheduled for: 6 months      Medication Assistance:  Patient does not qualify for any patient assistance programs at this time.  Compliance/Adherence/Medication fill history: Care Gaps: Hep C screening, Prevnar [facility] [address]), AWV Last BP - 112/71 on 11/17/2021 Last A1C - 5.9 on 07/20/2021   Star-Rating Drugs: None  Patient's preferred pharmacy  is:  [id number] - Closed - [place], [place] - [address] AT [facility] [address] [place] [address] Phone: [phone] Fax: [phone]  [place], [place] [address] Phone: [phone] Fax: [place], [place] [address] [place] [address] [place] [address] Phone: [phone] Fax: [phone]  Uses pill box? Yes - wife fills them once a month (Elizabeth reminders and wife) Pt endorses 100% compliance  We discussed: Current pharmacy is preferred with insurance plan and patient is satisfied with pharmacy services Patient decided to: Continue current medication management strategy  Care Plan and Follow Up Patient Decision:  Patient agrees to Care Plan and Follow-up.  Plan: Telephone follow up appointment with care management team member scheduled for:  6 months  Jeni Salles, PharmD, [place] [facility] [place] [phone]

## 2022-01-14 DIAGNOSIS — J449 Chronic obstructive pulmonary disease, unspecified: Secondary | ICD-10-CM

## 2022-01-14 DIAGNOSIS — N3281 Overactive bladder: Secondary | ICD-10-CM

## 2022-01-14 DIAGNOSIS — N401 Enlarged prostate with lower urinary tract symptoms: Secondary | ICD-10-CM

## 2022-01-17 ENCOUNTER — Telehealth: Payer: Self-pay | Admitting: Pulmonary Disease

## 2022-01-17 NOTE — Telephone Encounter (Signed)
PT called stating he switched ins to [facility]. Needs Trelegy  RX sent in to [place] please. On his last dosage. Here is his new ins info if needed.    Ins ID: [id number] Group # [id number]

## 2022-01-18 DIAGNOSIS — K219 Gastro-esophageal reflux disease without esophagitis: Secondary | ICD-10-CM | POA: Diagnosis not present

## 2022-01-18 DIAGNOSIS — R490 Dysphonia: Secondary | ICD-10-CM | POA: Diagnosis not present

## 2022-01-19 ENCOUNTER — Other Ambulatory Visit: Payer: Self-pay

## 2022-01-19 ENCOUNTER — Encounter: Payer: Self-pay | Admitting: Gastroenterology

## 2022-01-19 MED ORDER — TRELEGY ELLIPTA 100-62.5-25 MCG/ACT IN AEPB: 1.0000 | INHALATION_SPRAY | Freq: Every day | RESPIRATORY_TRACT | 3 refills | Status: DC

## 2022-01-19 MED ORDER — OMEPRAZOLE 40 MG PO CPDR: 40.0000 mg | DELAYED_RELEASE_CAPSULE | Freq: Two times a day (BID) | ORAL | 1 refills | Status: DC

## 2022-01-19 NOTE — Telephone Encounter (Signed)
Spoke with the pt  Rx for trelegy 100 sent to preferred pharm  Nothing further needed

## 2022-01-22 ENCOUNTER — Other Ambulatory Visit: Payer: Self-pay

## 2022-01-22 DIAGNOSIS — G250 Essential tremor: Secondary | ICD-10-CM

## 2022-01-22 MED ORDER — PRIMIDONE 50 MG PO TABS: ORAL_TABLET | ORAL | 0 refills | Status: DC

## 2022-01-24 DIAGNOSIS — L821 Other seborrheic keratosis: Secondary | ICD-10-CM | POA: Diagnosis not present

## 2022-01-24 DIAGNOSIS — R202 Paresthesia of skin: Secondary | ICD-10-CM | POA: Diagnosis not present

## 2022-01-24 DIAGNOSIS — L814 Other melanin hyperpigmentation: Secondary | ICD-10-CM | POA: Diagnosis not present

## 2022-01-24 DIAGNOSIS — R208 Other disturbances of skin sensation: Secondary | ICD-10-CM | POA: Diagnosis not present

## 2022-01-24 DIAGNOSIS — L538 Other specified erythematous conditions: Secondary | ICD-10-CM | POA: Diagnosis not present

## 2022-01-24 DIAGNOSIS — D225 Melanocytic nevi of trunk: Secondary | ICD-10-CM | POA: Diagnosis not present

## 2022-01-24 DIAGNOSIS — L299 Pruritus, unspecified: Secondary | ICD-10-CM | POA: Diagnosis not present

## 2022-01-24 DIAGNOSIS — L853 Xerosis cutis: Secondary | ICD-10-CM | POA: Diagnosis not present

## 2022-01-24 DIAGNOSIS — L918 Other hypertrophic disorders of the skin: Secondary | ICD-10-CM | POA: Diagnosis not present

## 2022-02-01 NOTE — Progress Notes (Signed)
Assessment/Plan:    1.  Essential Tremor  Patient had DBS surgery on the left brain in [place] in 2016.  Patient status post IPG change in January, 2022.    -he is c/o L hand tremor getting worse.  Discussed dbs to the R brain but he thinks he doesn't want to pursue due to age.  Not sure he would be focused ultrasound candidate given he would have to be in an MRI machine to do it.  His device is compatible but has to be only 1.5 T magnet and can only be in for short periods and not sure that this would allow for the procedure to be completed.  -increase primidone, 50 mg, 3in the AM, 1 in the pM    2.  COPD  -Following with Dr. Halford Chessman.  On meds which could contribute to tremors.  3.  Back and neck pain  -Following with Dr. Reatha Armour and Dr. Davy Pique.    4.  Memory change, possible MCI  -Patient with neurocognitive testing scheduled for June, 2024  -On B12 supplementation for mild B12 deficiency.  Subjective:   Allen [name] was seen today in follow up for essential tremor.  My previous records were reviewed prior to todays visit.  Last visit, patient reported that his pharmacy would not send him the primidone, stating that there was an interaction with the psychiatry medications.  We ended up calling them, and they had stated that this was true, but it was an error on their part.  We told him to go ahead and restart the primidone.  His biggest c/o is L hand tremor getting worse.  He did see Arlana Hove in September for memory change.  His B12 was 271 and he was told to start B12 supplementation.  MRI brain was performed and was unremarkable.  He has been working on losing weight via nutrisystem  Current prescribed movement disorder medications: Primidone, 50 mg, 2 tablets in the morning and 1 tablet at night     PREVIOUS MEDICATIONS:  primidone (pt wanted to restart but felt caused ED at low dose), propranolol, gabapentin; not beta-blocker candidate now because of asthma  ALLERGIES:    Allergies  Allergen Reactions   Copper-Containing Compounds     Copper gloves caused redness and blisters bilateral hands    CURRENT MEDICATIONS:  Outpatient Encounter Medications as of 02/05/2022  Medication Sig   Ascorbic Acid (VITAMIN C) 1000 MG tablet Take 1,000 mg by mouth in the morning.   azelastine (ASTELIN) 0.1 % nasal spray Place 1-2 sprays into both nostrils 2 (two) times daily as needed for rhinitis.   buPROPion (WELLBUTRIN XL) 150 MG 24 hr tablet Take 150 mg by mouth every morning.   Cholecalciferol (VITAMIN D3) 125 MCG (5000 UT) TABS Take 5,000 Units by mouth in the morning.   Coenzyme Q10 300 MG CAPS Take 300 mg by mouth in the morning.   escitalopram (LEXAPRO) 20 MG tablet Take 1 tablet (20 mg total) by mouth daily. (Patient taking differently: Take 20 mg by mouth at bedtime.)   fluticasone (FLONASE) 50 MCG/ACT nasal spray Place 2 sprays into both nostrils daily as needed for allergies or rhinitis.   Fluticasone-Umeclidin-Vilant (TRELEGY ELLIPTA) 100-62.5-25 MCG/ACT AEPB Inhale 1 puff into the lungs daily.   Fluticasone-Umeclidin-Vilant (TRELEGY ELLIPTA) 100-62.5-25 MCG/ACT AEPB Inhale 1 puff into the lungs daily.   MYRBETRIQ 50 MG TB24 tablet Take 50 mg by mouth in the morning.   Omega-3 Fatty Acids (FISH OIL PO) Take  2,200 mg by mouth in the morning.   omeprazole (PRILOSEC) 40 MG capsule Take 1 capsule (40 mg total) by mouth 2 (two) times daily.   primidone (MYSOLINE) 50 MG tablet TAKE 2 TABLETS IN THE MORNING AND TAKE 1 TABLET IN THE EVENING   tadalafil (CIALIS) 20 MG tablet Take 1 tablet (20 mg total) by mouth daily as needed for erectile dysfunction. (Patient taking differently: Take 10 mg by mouth daily.)   tamsulosin (FLOMAX) 0.4 MG CAPS capsule Take 0.4 mg by mouth in the morning.   Testosterone Undecanoate 112.5 MG CAPS Take by mouth.   traZODone (DESYREL) 100 MG tablet TAKE 1 TABLET AT BEDTIME   TURMERIC PO Take 2,250 mg by mouth in the morning.   zaleplon  (SONATA) 10 MG capsule Take 10 mg by mouth at bedtime.   No facility-administered encounter medications on file as of 02/05/2022.     Objective:    PHYSICAL EXAMINATION:    VITALS:   Vitals:   02/05/22 1015  BP: 120/72  Pulse: 66  SpO2: 97%  Weight: 202 lb 6.4 oz (91.8 kg)  Height: '5\' 10"'$  (1.778 m)    Wt Readings from Last 3 Encounters:  02/05/22 202 lb 6.4 oz (91.8 kg)  11/17/21 216 lb 3.2 oz (98.1 kg)  11/16/21 217 lb (98.4 kg)    GEN:  The patient appears stated age and is in NAD. HEENT:  Normocephalic, atraumatic.  The mucous membranes are moist. The superficial temporal arteries are without ropiness or tenderness.   Neurological examination:  Orientation: The patient is alert and oriented x3. Cranial nerves: There is good facial symmetry. The speech is fluent and clear. Soft palate rises symmetrically and there is no tongue deviation. Hearing is intact to conversational tone. Sensation: Sensation is intact to light touch throughout Motor: Strength is at least antigravity x4.  Movement examination: Tone: There is normal tone in the UE/LE Abnormal movements: The right hand has minimal tremor.  The left hand does have mild to moderate tremor, worse with holding objects.  I have reviewed and interpreted the following labs independently   Chemistry      Component Value Date/Time   NA 139 08/22/2021 0906   K 4.8 08/22/2021 0906   CL 108 08/22/2021 0906   CO2 26 08/22/2021 0906   BUN 15 08/22/2021 0906   CREATININE 1.21 08/22/2021 0906      Component Value Date/Time   CALCIUM 9.6 08/22/2021 0906   ALKPHOS 46 07/20/2021 1010   AST 21 07/20/2021 1010   ALT 20 07/20/2021 1010   BILITOT 0.5 07/20/2021 1010      Lab Results  Component Value Date   WBC 6.7 08/22/2021   HGB 13.1 08/22/2021   HCT 40.8 08/22/2021   MCV 97.8 08/22/2021   PLT 277 08/22/2021   Lab Results  Component Value Date   TSH 1.95 07/20/2021     Chemistry      Component Value  Date/Time   NA 139 08/22/2021 0906   K 4.8 08/22/2021 0906   CL 108 08/22/2021 0906   CO2 26 08/22/2021 0906   BUN 15 08/22/2021 0906   CREATININE 1.21 08/22/2021 0906      Component Value Date/Time   CALCIUM 9.6 08/22/2021 0906   ALKPHOS 46 07/20/2021 1010   AST 21 07/20/2021 1010   ALT 20 07/20/2021 1010   BILITOT 0.5 07/20/2021 1010         Total time spent on today's visit was 30 minutes, including  both face-to-face time and nonface-to-face time.  Time included that spent on review of records (prior notes available to me/labs/imaging if pertinent), discussing treatment and goals, answering patient's questions and coordinating care.  This was separate from the DBS time.  Cc:  Isaac Bliss, Rayford Halsted, MD

## 2022-02-05 ENCOUNTER — Other Ambulatory Visit: Payer: Self-pay

## 2022-02-05 ENCOUNTER — Other Ambulatory Visit: Payer: Self-pay | Admitting: Neurology

## 2022-02-05 ENCOUNTER — Ambulatory Visit: Payer: Medicare Other | Admitting: Neurology

## 2022-02-05 VITALS — BP 120/72 | HR 66 | Ht 70.0 in | Wt 202.4 lb

## 2022-02-05 DIAGNOSIS — Z9689 Presence of other specified functional implants: Secondary | ICD-10-CM | POA: Diagnosis not present

## 2022-02-05 DIAGNOSIS — G250 Essential tremor: Secondary | ICD-10-CM

## 2022-02-05 MED ORDER — PRIMIDONE 50 MG PO TABS: ORAL_TABLET | ORAL | 1 refills | Status: DC

## 2022-02-05 NOTE — Procedures (Signed)
DBS Programming was performed.  Patient's device interrogated.  DBS confirmed to be on.  Battery not near end-of-life.  Impedances were normal.  Total time spent programming the device was 25 minutes.  Final settings were as follows.  Manufacturer of DBS device: [employer]      Active Contact Amplitude (V) PW (ms) Frequency (hz) battery Battery  Left Brain        Group A        11/26/19 2-C+ 2.1 60 150    01/04/20 2-C+ 2.1 60 150                            Group B (active)        11/26/19 2-0+ 2.9 90 170  2.63  01/04/20 2-0+ 2.9 90 170  2.56  07/04/20 2-0+ 3.1 90 170  2.96  01/30/21 2-0+ 3.2 90 170  2.95  07/31/21 2-0+ 3.6 90 180  2.92  02/05/22 2-0+ 3.6 90 180 2.88                   Right Brain        n/a

## 2022-02-05 NOTE — Patient Instructions (Signed)
increase primidone, 50 mg, 3in the AM, 1 in the pM

## 2022-02-06 ENCOUNTER — Ambulatory Visit: Payer: Medicare Other | Admitting: Podiatry

## 2022-02-15 DIAGNOSIS — D2239 Melanocytic nevi of other parts of face: Secondary | ICD-10-CM | POA: Diagnosis not present

## 2022-02-15 DIAGNOSIS — Z85828 Personal history of other malignant neoplasm of skin: Secondary | ICD-10-CM | POA: Diagnosis not present

## 2022-02-15 DIAGNOSIS — D225 Melanocytic nevi of trunk: Secondary | ICD-10-CM | POA: Diagnosis not present

## 2022-02-15 DIAGNOSIS — D1801 Hemangioma of skin and subcutaneous tissue: Secondary | ICD-10-CM | POA: Diagnosis not present

## 2022-02-15 DIAGNOSIS — L821 Other seborrheic keratosis: Secondary | ICD-10-CM | POA: Diagnosis not present

## 2022-02-15 DIAGNOSIS — D2261 Melanocytic nevi of right upper limb, including shoulder: Secondary | ICD-10-CM | POA: Diagnosis not present

## 2022-02-15 DIAGNOSIS — L300 Nummular dermatitis: Secondary | ICD-10-CM | POA: Diagnosis not present

## 2022-02-15 DIAGNOSIS — L905 Scar conditions and fibrosis of skin: Secondary | ICD-10-CM | POA: Diagnosis not present

## 2022-02-22 ENCOUNTER — Encounter: Payer: Self-pay | Admitting: Podiatry

## 2022-02-22 ENCOUNTER — Ambulatory Visit: Payer: Medicare Other | Admitting: Podiatry

## 2022-02-22 VITALS — BP 134/72 | HR 73

## 2022-02-22 DIAGNOSIS — M79674 Pain in right toe(s): Secondary | ICD-10-CM

## 2022-02-22 DIAGNOSIS — M79675 Pain in left toe(s): Secondary | ICD-10-CM | POA: Diagnosis not present

## 2022-02-22 DIAGNOSIS — B351 Tinea unguium: Secondary | ICD-10-CM | POA: Diagnosis not present

## 2022-02-22 NOTE — Progress Notes (Signed)
This patient returns to the office for evaluation and treatment of long thick painful nails .  This patient is unable to trim his own nails since the patient cannot reach his feet.  Patient says the nails are painful walking and wearing his shoes.  He returns for preventive foot care services.  General Appearance  Alert, conversant and in no acute stress.  Vascular  Dorsalis pedis and posterior tibial  pulses are palpable  bilaterally.  Capillary return is within normal limits  bilaterally. Temperature is within normal limits  bilaterally.  Neurologic  Senn-Weinstein monofilament wire test within normal limits  bilaterally. Muscle power within normal limits bilaterally.  Nails Thick disfigured discolored nails with subungual debris  from hallux to fifth toes bilaterally.  Hallux nails are especially thickened and split and painful   No evidence of bacterial infection or drainage bilaterally.  Orthopedic  No limitations of motion  feet .  No crepitus or effusions noted.  No bony pathology or digital deformities noted.  Skin  normotropic skin with no porokeratosis noted bilaterally.  No signs of infections or ulcers noted.     Onychomycosis  Pain in toes right foot  Pain in toes left foot  Debridement  of nails  1-5  B/L with a nail nipper.  Nails were then filed using a dremel tool with no incidents.    RTC 4  months    Ghislaine Harcum DPM  

## 2022-02-22 NOTE — Patient Instructions (Signed)
This patient returns to the office for evaluation and treatment of long thick painful nails .  This patient is unable to trim his own nails since the patient cannot reach his feet.  Patient says the nails are painful walking and wearing his shoes.  He returns for preventive foot care services.  General Appearance  Alert, conversant and in no acute stress.  Vascular  Dorsalis pedis and posterior tibial  pulses are palpable  bilaterally.  Capillary return is within normal limits  bilaterally. Temperature is within normal limits  bilaterally.  Neurologic  Senn-Weinstein monofilament wire test within normal limits  bilaterally. Muscle power within normal limits bilaterally.  Nails Thick disfigured discolored nails with subungual debris  from hallux to fifth toes bilaterally.  Hallux nails are especially thickened and split and painful   No evidence of bacterial infection or drainage bilaterally.  Orthopedic  No limitations of motion  feet .  No crepitus or effusions noted.  No bony pathology or digital deformities noted.  Skin  normotropic skin with no porokeratosis noted bilaterally.  No signs of infections or ulcers noted.     Onychomycosis  Pain in toes right foot  Pain in toes left foot  Debridement  of nails  1-5  B/L with a nail nipper.  Nails were then filed using a dremel tool with no incidents.    RTC 4  months    Antionetta Ator DPM  

## 2022-02-26 ENCOUNTER — Encounter: Payer: Self-pay | Admitting: Internal Medicine

## 2022-02-26 DIAGNOSIS — G250 Essential tremor: Secondary | ICD-10-CM

## 2022-02-28 ENCOUNTER — Telehealth: Payer: Self-pay | Admitting: Internal Medicine

## 2022-02-28 NOTE — Telephone Encounter (Addendum)
Spouse called to request a referral for Pt to go to [facility] [place] is not receiving any faxes at this time, as there is some kind of fax machine malfunction.   Spouse is asking that MD please call and make an appointment for Pt's evaluation.  [phone]

## 2022-03-06 ENCOUNTER — Other Ambulatory Visit: Payer: Self-pay | Admitting: Gastroenterology

## 2022-03-06 ENCOUNTER — Encounter: Payer: Self-pay | Admitting: Internal Medicine

## 2022-03-06 MED ORDER — MONTELUKAST SODIUM 10 MG PO TABS: 10.0000 mg | ORAL_TABLET | Freq: Every day | ORAL | 1 refills | Status: DC

## 2022-03-06 NOTE — Telephone Encounter (Signed)
Yes okay to refill, he can reduce to once daily if that can control his symptoms.  Can you write the prescription as once or twice daily as needed.  Thanks

## 2022-03-11 ENCOUNTER — Other Ambulatory Visit: Payer: Self-pay | Admitting: Internal Medicine

## 2022-03-21 DIAGNOSIS — R3916 Straining to void: Secondary | ICD-10-CM | POA: Diagnosis not present

## 2022-03-21 DIAGNOSIS — R3911 Hesitancy of micturition: Secondary | ICD-10-CM | POA: Diagnosis not present

## 2022-03-22 ENCOUNTER — Encounter: Payer: Self-pay | Admitting: Nurse Practitioner

## 2022-03-22 ENCOUNTER — Ambulatory Visit: Payer: Medicare Other | Admitting: Nurse Practitioner

## 2022-03-22 VITALS — BP 122/70 | HR 75 | Ht 70.0 in | Wt 199.4 lb

## 2022-03-22 DIAGNOSIS — J302 Other seasonal allergic rhinitis: Secondary | ICD-10-CM

## 2022-03-22 DIAGNOSIS — I5032 Chronic diastolic (congestive) heart failure: Secondary | ICD-10-CM

## 2022-03-22 DIAGNOSIS — Z9189 Other specified personal risk factors, not elsewhere classified: Secondary | ICD-10-CM

## 2022-03-22 DIAGNOSIS — J45901 Unspecified asthma with (acute) exacerbation: Secondary | ICD-10-CM

## 2022-03-22 DIAGNOSIS — G4733 Obstructive sleep apnea (adult) (pediatric): Secondary | ICD-10-CM | POA: Diagnosis not present

## 2022-03-22 DIAGNOSIS — J441 Chronic obstructive pulmonary disease with (acute) exacerbation: Secondary | ICD-10-CM | POA: Diagnosis not present

## 2022-03-22 DIAGNOSIS — R911 Solitary pulmonary nodule: Secondary | ICD-10-CM | POA: Diagnosis not present

## 2022-03-22 DIAGNOSIS — J4489 Other specified chronic obstructive pulmonary disease: Secondary | ICD-10-CM

## 2022-03-22 DIAGNOSIS — R918 Other nonspecific abnormal finding of lung field: Secondary | ICD-10-CM

## 2022-03-22 MED ORDER — AZELASTINE-FLUTICASONE 137-50 MCG/ACT NA SUSP: 1.0000 | Freq: Two times a day (BID) | NASAL | 5 refills | Status: DC

## 2022-03-22 MED ORDER — PREDNISONE 20 MG PO TABS: 40.0000 mg | ORAL_TABLET | Freq: Every day | ORAL | 0 refills | Status: AC

## 2022-03-22 NOTE — Assessment & Plan Note (Signed)
Flare with changing weather. Advised he restart daily antihistamine. Target postnasal drainage with saline rinses. Rx sent for azelastine/fluticasone combo nasal spray; if expensive, he will return to using astelin and flonase separately. He will continue singulair at night for trigger prevention.  Patient Instructions  Switch flonase and astelin to combo nasal spray - Azelastine/fluticasone 1 spray each nostril Twice daily  Start saline nasal rinses 1-2 times a day. Do this 20-30 min before your nasal spray  Start over the counter antihistamine such as claritin, zyrtec, xyzal or allegra for allergies Prednisone 40 mg daily for 5 days. Take in AM with food  Continue Trelegy 1 puff daily. Brush tongue and rinse mouth afterwards Continue montelukast 1 tab daily  Continue omeprazole 1 capsule Twice daily for reflux  Follow up in 2 weeks with Dr. Halford Chessman or Katie Yandiel Bergum,NP. If symptoms do not improve or worsen, please contact office for sooner follow up or seek emergency care.

## 2022-03-22 NOTE — Assessment & Plan Note (Signed)
Former heavy smoker. Small, scattered nodules. Stable from 2021-2022. No CT chest imaging documented since. Orders for repeat CT placed today.

## 2022-03-22 NOTE — Assessment & Plan Note (Addendum)
Euvolemic on exam. Follow up with cardiology as scheduled

## 2022-03-22 NOTE — Patient Instructions (Signed)
Switch flonase and astelin to combo nasal spray - Azelastine/fluticasone 1 spray each nostril Twice daily  Start saline nasal rinses 1-2 times a day. Do this 20-30 min before your nasal spray  Start over the counter antihistamine such as claritin, zyrtec, xyzal or allegra for allergies Prednisone 40 mg daily for 5 days. Take in AM with food  Continue Trelegy 1 puff daily. Brush tongue and rinse mouth afterwards Continue montelukast 1 tab daily  Continue omeprazole 1 capsule Twice daily for reflux  Follow up in 2 weeks with Dr. Halford Chessman or Katie Antawan Mchugh,NP. If symptoms do not improve or worsen, please contact office for sooner follow up or seek emergency care.

## 2022-03-22 NOTE — Assessment & Plan Note (Signed)
Mild OSA 04/2021. He is down 40 lb since his study. Symptoms have resolved. He will continue to monitor symptoms and work on maintaining healthy weight lose.

## 2022-03-22 NOTE — Progress Notes (Signed)
$'@Patient's$  ID: Allen [name], male    DOB: 09-24-1950, 72 y.o.   MRN: [id number]  Chief Complaint  Patient presents with   Follow-up    Pt f/u he states that he is having some increased difficulty w/ his allergies and catching his breath in the morning    Referring provider: Isaac Bliss, [place]*  HPI: 72 year old male, former smoker (60 pack years) followed for COPD/asthma, OSA, allergic rhinitis, obesity hypoventilation syndrome. He is a patient of Dr. Juanetta Gosling and last seen in office 11/07/2021. Past medical history significant for diastolic CHF, GERD, UC, carpal tunnel, DDD, BPH, vit D deficiency, insomnia.   TEST/EVENTS:  04/20/2015 HST: AHI 40  12/21/2016 PFTs: FEV1 2.4 (69), FEV1% 74, TLC 4.95 (73%), DLCO 77%, +BD 04/10/2019 IgE: 66  04/20/2019 PFTs: FEV1 2.09 (62%), FEV1% 74, TLC 5.33 (75%), DLCO 93%  05/04/2019 CT chest: centrilobular and paraseptal emphysema, 3 mm nodule RUL, 4 mm nodule RML, 3 mm nodule lingula 05/11/2020 CT chest: stable nodules up to 3 mm  05/08/2021: OV with [facility] [employer] NP for follow up after home sleep study which showed a mild obstructive sleep apnea and significant time spent below 88%. He has a BiPAP machine at home that is 72 years old and paid for so he would like to go back on this. He does continue to wake frequently throughout the night, despite the use of trazodone. Also notes daytime fatigue symptoms. He denies morning headaches or drowsy driving. He would like to restart his BiPAP therapy but wants to see what other mask options he may have. This was his biggest issue last time as the full face mask mad him breakout.   Overall, he reports feeling well without any recent exacerbations in his COPD/asthma. Breathing has been well-controlled. He does have some DOE with strenuous activity, but states it is unchanged from baseline. Denies any cough or wheezing. He really only uses albuterol with exercise. Continues on Trelegy daily. Uses flonase and astelin as needed for  allergies. He is anticipated to see a cardiologist May 1st for evaluation of his lower extremity swelling.   11/07/2021: OV with Dr. Halford Chessman. Lost about 35 lb since his last visit. Feels much better. Stopped using CPAP. Sleeping well. Exercising more. Now only getting out of breath going up stairs. Feels that Trelegy helps. Breztri and symbicort ineffective and too expensive. Having more trouble with reflux. Causes voice to be raspy. Trial OTC PPI. Referral to GI.   03/22/2022: Today - acute Patient presents today for acute visit. Over the past week, he's had increased nasal congestion and clear drainage. He's also noticed that he feels more short of breath in the morning. He's also having some chest congestion in the AM. No cough, wheezing, fevers, chills, headaches, body aches. He denies any sick exposures. He's pretty sure this is related to his allergies; usually happens this time of year. He is using his Trelegy daily. Had to use his albuterol once this week. He's taking singulair and using astelin and flonase nasal spray. He does want to see if insurance will cover the combo nasal spray again so he only has to have one spray. No current antihistamine.  He saw GI and ENT since he was here last for evaluation of his hoarseness. No physical findings on their exams. He was started on twice daily PPI. Feels like reflux is better but still has his baseline hoarseness. Curious about his next CT scan of his chest. He's had some small nodules in the  past. He believes his last CT was a year ago in April (no record of this in his chart; last CT is from 04/2020 with stability x 1 year). He is a former heavy smoker. Quit in 1991. Not a candidate for lung cancer screening program due to this. Denies hemoptysis, anorexia. He has had intentional weight loss over the past year.  Off CPAP. He's lost an additional 20 pounds since he was here last; down 57 lb total. Sleeping well at night. No orthopnea, PND, snoring, excessive  daytime fatigue, morning headaches, drowsy driving.   Allergies  Allergen Reactions   Copper-Containing Compounds     Copper gloves caused redness and blisters bilateral hands    Immunization History  Administered Date(s) Administered   Fluad Quad(high Dose 65+) 09/18/2018, 11/10/2019   Influenza, High Dose Seasonal PF 11/01/2020   Influenza-Unspecified 10/06/2021   PFIZER Comirnaty(Gray Top)Covid-19 Tri-Sucrose Vaccine 10/06/2021   PFIZER(Purple Top)SARS-COV-2 Vaccination 02/20/2019, 03/17/2019, 06/02/2019, 10/03/2019, 11/01/2020   Pneumococcal Polysaccharide-23 01/08/2015   Pneumococcal-Unspecified 12/15/2017   Tdap 09/17/2016   Zoster Recombinat (Shingrix) 06/06/2019, 09/05/2019    Past Medical History:  Diagnosis Date   Anxiety    Arthritis    neck -   BPH with obstruction/lower urinary tract symptoms    urologist-- dr pace   Cancer [facility])    basal cell to nose   Centrilobular emphysema ([place])    followed by dr Halford Chessman   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followed by dr Halford Chessman   Cough 07/2020   covid residual, no congestion   DOE (dyspnea on exertion)    due to copd/ emphysema   ED (erectile dysfunction)    Essential tremor    neurologist--- dr tat----  bilateral upper extremities,  s/p DPS 12/ 2016   GERD (gastroesophageal reflux disease)    History of 2019 novel coronavirus disease (COVID-19) 07/18/2020   per pt postive home test 07-18-2020 w/ mild  symptoms per pt; pt stated had urgent care visit 07-24-2020 for sob/ cough with negative cxr (in care everywhere) ;  follow up pcp visit 08-03-2020 in epic   Insomnia    Mixed simple and mucopurulent chronic bronchitis (HCC)    OSA (obstructive sleep apnea)    followed by dr Halford Chessman---- no longer does uses bipap, intolerant --- pt prescriped  Oxygen  2 L oxygen via [place] while sleeping  (study in epic 04/ 2017 AHI 40)   Perennial allergic rhinitis    Pneumonia    Pulmonary nodules    followed by dr Halford Chessman   S/P deep brain stimulator placement  12/2014   followed by neurology-----IPG change 01-21-2020 for end-of life battery  (pt has a control)   Varicose vein of leg    per pt has had 3 procedure's last one 2006   Wears dentures    upper    Tobacco History: Social History   Tobacco Use  Smoking Status Former   Packs/day: 3.00   Years: 20.00   Total pack years: 60.00   Types: Cigarettes   Quit date: 1991   Years since quitting: 33.2  Smokeless Tobacco Never   Counseling given: Not Answered   Outpatient Medications Prior to Visit  Medication Sig Dispense Refill   Ascorbic Acid (VITAMIN C) 1000 MG tablet Take 1,000 mg by mouth in the morning.     azelastine (ASTELIN) 0.1 % nasal spray Place 1-2 sprays into both nostrils 2 (two) times daily as needed for rhinitis. 30 mL 5   buPROPion (WELLBUTRIN XL)  150 MG 24 hr tablet Take 150 mg by mouth every morning.     Cholecalciferol (VITAMIN D3) 125 MCG (5000 UT) TABS Take 5,000 Units by mouth in the morning.     Coenzyme Q10 300 MG CAPS Take 300 mg by mouth in the morning.     escitalopram (LEXAPRO) 20 MG tablet Take 1 tablet (20 mg total) by mouth daily. (Patient taking differently: Take 20 mg by mouth at bedtime.) 90 tablet 1   fluticasone (FLONASE) 50 MCG/ACT nasal spray Place 2 sprays into both nostrils daily as needed for allergies or rhinitis. 16 g 5   Fluticasone-Umeclidin-Vilant (TRELEGY ELLIPTA) 100-62.5-25 MCG/ACT AEPB Inhale 1 puff into the lungs daily. 28 each 0   Fluticasone-Umeclidin-Vilant (TRELEGY ELLIPTA) 100-62.5-25 MCG/ACT AEPB Inhale 1 puff into the lungs daily. 180 each 3   montelukast (SINGULAIR) 10 MG tablet TAKE 1 TABLET BY MOUTH AT  BEDTIME 100 tablet 0   MYRBETRIQ 50 MG TB24 tablet Take 50 mg by mouth in the morning.     Omega-3 Fatty Acids (FISH OIL PO) Take 2,200 mg by mouth in the morning.     omeprazole (PRILOSEC) 40 MG capsule Take 40 mg, 1 capsule, by mouth once to twice daily 180 capsule 3   primidone (MYSOLINE) 50 MG tablet TAKE 3 TABLETS IN THE  MORNING AND TAKE 1 TABLET IN THE EVENING 360 tablet 1   tadalafil (CIALIS) 20 MG tablet Take 1 tablet (20 mg total) by mouth daily as needed for erectile dysfunction. (Patient taking differently: Take 10 mg by mouth daily.) 90 tablet 3   tamsulosin (FLOMAX) 0.4 MG CAPS capsule Take 0.4 mg by mouth in the morning.     Testosterone Undecanoate 112.5 MG CAPS Take by mouth.     traZODone (DESYREL) 100 MG tablet TAKE 1 TABLET AT BEDTIME 90 tablet 1   TURMERIC PO Take 2,250 mg by mouth in the morning.     zaleplon (SONATA) 10 MG capsule Take 10 mg by mouth at bedtime.     No facility-administered medications prior to visit.     Review of Systems:   Constitutional: No weight loss or gain, night sweats, fevers, chills, fatigue HEENT: No headaches, difficulty swallowing, tooth/dental problems, or sore throat. No sneezing, itching, ear ache. +nasal congestion, post nasal drip, occasional facial pressure CV:  No chest pain, orthopnea, swelling in lower extremities, PND, anasarca, dizziness, palpitations, syncope Resp: +increased AM shortness of breath; chest congestion. No excess mucus or change in color of mucus. No productive or non-productive. No hemoptysis. No wheezing.  No chest wall deformity GI:  No heartburn, indigestion, abdominal pain, nausea, vomiting, diarrhea, change in bowel habits, loss of appetite, bloody stools.  Skin: No rash, lesions, ulcerations MSK:  No joint pain or swelling.   Neuro: No dizziness or lightheadedness.  Psych: No depression or anxiety. Mood stable.     Physical Exam:  BP 122/70   Pulse 75   Ht '5\' 10"'$  (1.778 m)   Wt 199 lb 6.4 oz (90.4 kg)   SpO2 97%   BMI 28.61 kg/m   GEN: Pleasant, interactive, well-appearing; in no acute distress. HEENT:  Normocephalic and atraumatic. PERRLA. Sclera white. Nasal turbinates boggy, moist and patent bilaterally. Clear rhinorrhea present. Oropharynx pink and moist, without exudate or edema. No lesions, ulcerations NECK:   Supple w/ fair ROM. No JVD present. Normal carotid impulses w/o bruits. Thyroid symmetrical with no goiter or nodules palpated. No lymphadenopathy.   CV: RRR, no m/r/g, no peripheral edema.  Pulses intact, +2 bilaterally. No cyanosis, pallor or clubbing. PULMONARY:  Unlabored, regular breathing. Minimal scattered rhonchi bases bilaterally otherwise clear A&P w/o wheezes/rales/rhonchi. No accessory muscle use. No dullness to percussion. GI: BS present and normoactive. Soft, non-tender to palpation. No organomegaly or masses detected. MSK: No erythema, warmth or tenderness. Cap refil <2 sec all extrem. No deformities or joint swelling noted.  Neuro: A/Ox3. No focal deficits noted.   Skin: Warm, no lesions or rashe Psych: Normal affect and behavior. Judgement and thought content appropriate.     Lab Results:  CBC    Component Value Date/Time   WBC 6.7 08/22/2021 0906   RBC 4.17 (L) 08/22/2021 0906   HGB 13.1 08/22/2021 0906   HCT 40.8 08/22/2021 0906   PLT 277 08/22/2021 0906   MCV 97.8 08/22/2021 0906   MCH 31.4 08/22/2021 0906   MCHC 32.1 08/22/2021 0906   RDW 13.6 08/22/2021 0906   LYMPHSABS 1.0 07/20/2021 1010   MONOABS 0.7 07/20/2021 1010   EOSABS 0.1 07/20/2021 1010   BASOSABS 0.1 07/20/2021 1010    BMET    Component Value Date/Time   NA 139 08/22/2021 0906   K 4.8 08/22/2021 0906   CL 108 08/22/2021 0906   CO2 26 08/22/2021 0906   GLUCOSE 110 (H) 08/22/2021 0906   BUN 15 08/22/2021 0906   CREATININE 1.21 08/22/2021 0906   CALCIUM 9.6 08/22/2021 0906   GFRNONAA >60 08/22/2021 0906    BNP No results found for: "BNP"   Imaging:  No results found.       Latest Ref Rng & Units 04/20/2019   11:33 AM  PFT Results  FVC-Pre L 2.82   FVC-Predicted Pre % 62   FVC-Post L 2.66   FVC-Predicted Post % 59   Pre FEV1/FVC % % 74   Post FEV1/FCV % % 76   FEV1-Pre L 2.09   FEV1-Predicted Pre % 62   FEV1-Post L 2.01   DLCO uncorrected ml/min/mmHg 24.64   DLCO UNC% %  93   DLCO corrected ml/min/mmHg 25.89   DLCO COR %Predicted % 98   DLVA Predicted % 144   TLC L 5.33   TLC % Predicted % 75   RV % Predicted % 94     No results found for: "NITRICOXIDE"      Assessment & Plan:   Allergic rhinitis Flare with changing weather. Advised he restart daily antihistamine. Target postnasal drainage with saline rinses. Rx sent for azelastine/fluticasone combo nasal spray; if expensive, he will return to using astelin and flonase separately. He will continue singulair at night for trigger prevention.  Patient Instructions  Switch flonase and astelin to combo nasal spray - Azelastine/fluticasone 1 spray each nostril Twice daily  Start saline nasal rinses 1-2 times a day. Do this 20-30 min before your nasal spray  Start over the counter antihistamine such as claritin, zyrtec, xyzal or allegra for allergies Prednisone 40 mg daily for 5 days. Take in AM with food  Continue Trelegy 1 puff daily. Brush tongue and rinse mouth afterwards Continue montelukast 1 tab daily  Continue omeprazole 1 capsule Twice daily for reflux  Follow up in 2 weeks with Dr. Halford Chessman or Katie Imagene Boss,NP. If symptoms do not improve or worsen, please contact office for sooner follow up or seek emergency care.    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([place]) Mild flare, brought on by allergies. See above plan. We will treat him with prednisone burst. Continue triple therapy. Action plan in place.   OSA  treated with BiPAP Mild OSA 04/2021. He is down 40 lb since his study. Symptoms have resolved. He will continue to monitor symptoms and work on maintaining healthy weight lose.  Pulmonary nodule less than 6 mm in diameter with high risk for malignant neoplasm Former heavy smoker. Small, scattered nodules. Stable from 2021-2022. No CT chest imaging documented since. Orders for repeat CT placed today.   Chronic diastolic CHF (congestive heart failure) (HCC) Euvolemic on exam. Follow up with cardiology as  scheduled    I spent 42 minutes of dedicated to the care of this patient on the date of this encounter to include pre-visit review of records, face-to-face time with the patient discussing conditions above, post visit ordering of testing, clinical documentation with the electronic health record, making appropriate referrals as documented, and communicating necessary findings to members of the patients care team.  Clayton Bibles, NP 03/22/2022  Pt aware and understands NP's role.

## 2022-03-22 NOTE — Assessment & Plan Note (Addendum)
Mild flare, brought on by allergies. See above plan. We will treat him with prednisone burst. Continue triple therapy. Action plan in place.

## 2022-03-22 NOTE — Progress Notes (Signed)
Reviewed and agree with assessment/plan.   Chesley Mires, MD [facility] Pulmonary/Critical Care 03/22/2022, 11:21 AM Pager:  [phone]

## 2022-03-27 DIAGNOSIS — M25562 Pain in left knee: Secondary | ICD-10-CM | POA: Diagnosis not present

## 2022-03-27 NOTE — Progress Notes (Unsigned)
[place] Phone: [phone] Subjective:    I'm seeing this patient by the request  of:  Isaac Bliss, Rayford Halsted, MD  CC: Neck pain follow-up  [id number]  Allen [name] is a 72 y.o. male coming in with complaint of back and neck pain. OMT 04/25/2021. Patient states that he is having some L sided neck pain today.   Also having L knee pain. Saw [facility] yesterday and had 10 CC's of fluid drawn off and had a steroid injection.   Medications patient has been prescribed: None  Taking:         Reviewed prior external information including notes and imaging from previsou exam, outside providers and external EMR if available.   As well as notes that were available from care everywhere and other healthcare systems.  Past medical history, social, surgical and family history all reviewed in electronic medical record.  No pertanent information unless stated regarding to the chief complaint.   Past Medical History:  Diagnosis Date   Anxiety    Arthritis    neck -   BPH with obstruction/lower urinary tract symptoms    urologist-- dr pace   Cancer [facility])    basal cell to nose   Centrilobular emphysema ([place])    followed by dr Halford Chessman   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followed by dr Halford Chessman   Cough 07/2020   covid residual, no congestion   DOE (dyspnea on exertion)    due to copd/ emphysema   ED (erectile dysfunction)    Essential tremor    neurologist--- dr tat----  bilateral upper extremities,  s/p DPS 12/ 2016   GERD (gastroesophageal reflux disease)    History of 2019 novel coronavirus disease (COVID-19) 07/18/2020   per pt postive home test 07-18-2020 w/ mild  symptoms per pt; pt stated had urgent care visit 07-24-2020 for sob/ cough with negative cxr (in care everywhere) ;  follow up pcp visit 08-03-2020 in epic   Insomnia    Mixed simple and mucopurulent chronic bronchitis (HCC)    OSA (obstructive sleep  apnea)    followed by dr Halford Chessman---- no longer does uses bipap, intolerant --- pt prescriped  Oxygen  2 L oxygen via [place] while sleeping  (study in epic 04/ 2017 AHI 40)   Perennial allergic rhinitis    Pneumonia    Pulmonary nodules    followed by dr Halford Chessman   S/P deep brain stimulator placement 12/2014   followed by neurology-----IPG change 01-21-2020 for end-of life battery  (pt has a control)   Varicose vein of leg    per pt has had 3 procedure's last one 2006   Wears dentures    upper    Allergies  Allergen Reactions   Copper-Containing Compounds     Copper gloves caused redness and blisters bilateral hands     Review of Systems:  No headache, visual changes, nausea, vomiting, diarrhea, constipation, dizziness, abdominal pain, skin rash, fevers, chills, night sweats, weight loss, swollen lymph nodes, body aches, joint swelling, chest pain, shortness of breath, mood changes. POSITIVE muscle aches  Objective  Blood pressure 102/68, pulse 72, height '5\' 10"'$  (1.778 m), weight 205 lb (93 kg), SpO2 96 %.   General: No apparent distress alert and oriented x3 mood and affect normal, dressed appropriately.  HEENT: Pupils equal, extraocular movements intact  Respiratory: Patient's speak in full sentences and does not appear short of breath  Cardiovascular: No  lower extremity edema, non tender, no erythema  Antalgic gait noted.  Patient does have some instability of the left knee noted.  Nontender to palpation with crepitus noted.  Osteopathic findings  C2 flexed rotated and side bent right C5 flexed rotated and side bent right T5 extended rotated and side bent right inhaled rib         Assessment and Plan:  Degenerative disc disease, cervical Significant arthritic changes but has been losing weight.  Discussed with patient about icing regimen and home exercises, continue to respond very well to osteopathic manipulation.  Has been taking gabapentin previously and we will refill as  needed.  Degenerative arthritis of left knee Patient was told from an outside facility that he did have some degeneration of the left knee that was fairly severe.  Given an injection yesterday by another provider.  Still has some instability with valgus and varus force.  Hinged brace given today.  Patient has done well and lost weight and is continuing to do so so would wait to make any custom brace while patient continues with the weight loss.  Discussed with patient about other treatment options including the potential for viscosupplementation.  Increase activity slowly otherwise.  Follow-up again in 6 to 8 weeks    Nonallopathic problems  Decision today to treat with OMT was based on Physical Exam  After verbal consent patient was treated with HVLA, ME, FPR techniques in cervical, rib, thoracic  areas  Patient tolerated the procedure well with improvement in symptoms  Patient given exercises, stretches and lifestyle modifications  See medications in patient instructions if given  Patient will follow up in 4-8 weeks     The above documentation has been reviewed and is accurate and complete Lyndal Pulley, DO         Note: This dictation was prepared with Dragon dictation along with smaller phrase technology. Any transcriptional errors that result from this process are unintentional.

## 2022-03-28 ENCOUNTER — Encounter: Payer: Self-pay | Admitting: Family Medicine

## 2022-03-28 ENCOUNTER — Ambulatory Visit: Payer: Medicare Other | Admitting: Family Medicine

## 2022-03-28 VITALS — BP 102/68 | HR 72 | Ht 70.0 in | Wt 205.0 lb

## 2022-03-28 DIAGNOSIS — M9902 Segmental and somatic dysfunction of thoracic region: Secondary | ICD-10-CM

## 2022-03-28 DIAGNOSIS — M5030 Other cervical disc degeneration, unspecified cervical region: Secondary | ICD-10-CM

## 2022-03-28 DIAGNOSIS — M9908 Segmental and somatic dysfunction of rib cage: Secondary | ICD-10-CM

## 2022-03-28 DIAGNOSIS — M1712 Unilateral primary osteoarthritis, left knee: Secondary | ICD-10-CM

## 2022-03-28 DIAGNOSIS — M9901 Segmental and somatic dysfunction of cervical region: Secondary | ICD-10-CM | POA: Diagnosis not present

## 2022-03-28 NOTE — Patient Instructions (Signed)
Hinged brace Exercises See me in 6-8 weeks

## 2022-03-28 NOTE — Assessment & Plan Note (Signed)
Patient was told from an outside facility that he did have some degeneration of the left knee that was fairly severe.  Given an injection yesterday by another provider.  Still has some instability with valgus and varus force.  Hinged brace given today.  Patient has done well and lost weight and is continuing to do so so would wait to make any custom brace while patient continues with the weight loss.  Discussed with patient about other treatment options including the potential for viscosupplementation.  Increase activity slowly otherwise.  Follow-up again in 6 to 8 weeks

## 2022-03-28 NOTE — Assessment & Plan Note (Signed)
Significant arthritic changes but has been losing weight.  Discussed with patient about icing regimen and home exercises, continue to respond very well to osteopathic manipulation.  Has been taking gabapentin previously and we will refill as needed.

## 2022-03-29 DIAGNOSIS — H353131 Nonexudative age-related macular degeneration, bilateral, early dry stage: Secondary | ICD-10-CM | POA: Diagnosis not present

## 2022-03-29 DIAGNOSIS — H43812 Vitreous degeneration, left eye: Secondary | ICD-10-CM | POA: Diagnosis not present

## 2022-03-29 DIAGNOSIS — Z961 Presence of intraocular lens: Secondary | ICD-10-CM | POA: Diagnosis not present

## 2022-04-02 ENCOUNTER — Encounter: Payer: Self-pay | Admitting: Family Medicine

## 2022-04-02 ENCOUNTER — Encounter: Payer: Self-pay | Admitting: Nurse Practitioner

## 2022-04-02 ENCOUNTER — Ambulatory Visit: Payer: Medicare Other | Admitting: Nurse Practitioner

## 2022-04-02 VITALS — BP 132/68 | HR 76 | Ht 70.0 in | Wt 207.0 lb

## 2022-04-02 DIAGNOSIS — J309 Allergic rhinitis, unspecified: Secondary | ICD-10-CM | POA: Diagnosis not present

## 2022-04-02 DIAGNOSIS — R911 Solitary pulmonary nodule: Secondary | ICD-10-CM | POA: Diagnosis not present

## 2022-04-02 DIAGNOSIS — Z9189 Other specified personal risk factors, not elsewhere classified: Secondary | ICD-10-CM | POA: Diagnosis not present

## 2022-04-02 DIAGNOSIS — G4733 Obstructive sleep apnea (adult) (pediatric): Secondary | ICD-10-CM

## 2022-04-02 DIAGNOSIS — J4489 Other specified chronic obstructive pulmonary disease: Secondary | ICD-10-CM | POA: Diagnosis not present

## 2022-04-02 MED ORDER — AZITHROMYCIN 250 MG PO TABS: ORAL_TABLET | ORAL | 0 refills | Status: DC

## 2022-04-02 MED ORDER — PREDNISONE 10 MG PO TABS: ORAL_TABLET | ORAL | 0 refills | Status: DC

## 2022-04-02 NOTE — Assessment & Plan Note (Signed)
Mild OSA 04/2021. He is down 40 lb since his study. Symptoms have resolved. He will continue to monitor symptoms and work on maintaining healthy weight lose. 

## 2022-04-02 NOTE — Assessment & Plan Note (Signed)
Former heavy smoker. Small, scattered nodules. Stable from 2021-2022. No CT chest imaging documented since. Orders for repeat CT placed today.  

## 2022-04-02 NOTE — Assessment & Plan Note (Signed)
Improved. We will continue his current nasal sprays and daily antihistamine.

## 2022-04-02 NOTE — Progress Notes (Signed)
@Patient  ID: [name], male    DOB: [birth year], 72 y.o.   MRN: [id number]  Chief Complaint  Patient presents with   Follow-up    Breathing is fine     Referring provider: Isaac Bliss, [place]*  HPI: 72 year old male, former smoker (60 pack years) followed for COPD/asthma, OSA, allergic rhinitis, obesity hypoventilation syndrome. He is a patient of Dr. Juanetta Gosling and last seen in office 03/22/2022 by [facility] NP. Past medical history significant for diastolic CHF, GERD, UC, carpal tunnel, DDD, BPH, vit D deficiency, insomnia.   TEST/EVENTS:  04/20/2015 HST: AHI 40  12/21/2016 PFTs: FEV1 2.4 (69), FEV1% 74, TLC 4.95 (73%), DLCO 77%, +BD 04/10/2019 IgE: 66  04/20/2019 PFTs: FEV1 2.09 (62%), FEV1% 74, TLC 5.33 (75%), DLCO 93%  05/04/2019 CT chest: centrilobular and paraseptal emphysema, 3 mm nodule RUL, 4 mm nodule RML, 3 mm nodule lingula 05/11/2020 CT chest: stable nodules up to 3 mm  05/08/2021: OV with Edith Groleau NP for follow up after home sleep study which showed a mild obstructive sleep apnea and significant time spent below 88%. He has a BiPAP machine at home that is 72 years old and paid for so he would like to go back on this. He does continue to wake frequently throughout the night, despite the use of trazodone. Also notes daytime fatigue symptoms. He denies morning headaches or drowsy driving. He would like to restart his BiPAP therapy but wants to see what other mask options he may have. This was his biggest issue last time as the full face mask mad him breakout.   Overall, he reports feeling well without any recent exacerbations in his COPD/asthma. Breathing has been well-controlled. He does have some DOE with strenuous activity, but states it is unchanged from baseline. Denies any cough or wheezing. He really only uses albuterol with exercise. Continues on Trelegy daily. Uses flonase and astelin as needed for allergies. He is anticipated to see a cardiologist May 1st for evaluation of his lower  extremity swelling.   11/07/2021: OV with Dr. Halford Chessman. Lost about 35 lb since his last visit. Feels much better. Stopped using CPAP. Sleeping well. Exercising more. Now only getting out of breath going up stairs. Feels that Trelegy helps. Breztri and symbicort ineffective and too expensive. Having more trouble with reflux. Causes voice to be raspy. Trial OTC PPI. Referral to GI.   03/22/2022: OV with Eyla Tallon NP for acute visit. Over the past week, he's had increased nasal congestion and clear drainage. He's also noticed that he feels more short of breath in the morning. He's also having some chest congestion in the AM. No cough, wheezing, fevers, chills, headaches, body aches. He denies any sick exposures. He's pretty sure this is related to his allergies; usually happens this time of year. He is using his Trelegy daily. Had to use his albuterol once this week. He's taking singulair and using astelin and flonase nasal spray. He does want to see if insurance will cover the combo nasal spray again so he only has to have one spray. No current antihistamine.  He saw GI and ENT since he was here last for evaluation of his hoarseness. No physical findings on their exams. He was started on twice daily PPI. Feels like reflux is better but still has his baseline hoarseness. Curious about his next CT scan of his chest. He's had some small nodules in the past. He believes his last CT was a year ago in April (no record of this  in his chart; last CT is from 04/2020 with stability x 1 year). He is a former heavy smoker. Quit in 1991. Not a candidate for lung cancer screening program due to this. Denies hemoptysis, anorexia. He has had intentional weight loss over the past year.  Off CPAP. He's lost an additional 20 pounds since he was here last; down 57 lb total. Sleeping well at night. No orthopnea, PND, snoring, excessive daytime fatigue, morning headaches, drowsy driving.   [id number]: Today - follow up Patient presents today  for follow-up after being treated for mild flare secondary to allergies with prednisone burst and started on allergic rhinitis regimen.  He is clinically improved.  Feels like he is back to his baseline.  Not having trouble with his breathing in the mornings anymore.  No longer feeling his chest is congested or tight.  Nasal congestion has pretty much subsided.  He is still using his Astelin/fluticasone combo spray.  Started Claritin for allergies.  Taking his Trelegy daily and Singulair at bedtime.  He did hurt his knee earlier today.  He is planning to go see Ortho to discuss this. He and his wife are going out of town in a few days.  They also have a trip to [place] planned next month.  He wanted to know if he could get a prescription for for an antibiotic and steroids just in case he were to have a flare while he was overseas.  Allergies  Allergen Reactions   Copper-Containing Compounds     Copper gloves caused redness and blisters bilateral hands    Immunization History  Administered Date(s) Administered   Fluad Quad(high Dose 65+) 09/18/2018, 11/10/2019   Influenza, High Dose Seasonal PF 11/01/2020   Influenza-Unspecified 10/06/2021   PFIZER Comirnaty(Gray Top)Covid-19 Tri-Sucrose Vaccine 10/06/2021   PFIZER(Purple Top)SARS-COV-2 Vaccination 02/20/2019, 03/17/2019, 06/02/2019, 10/03/2019, 11/01/2020   Pneumococcal Polysaccharide-23 01/08/2015   Pneumococcal-Unspecified 12/15/2017   Tdap 09/17/2016   Zoster Recombinat (Shingrix) 06/06/2019, 09/05/2019    Past Medical History:  Diagnosis Date   Anxiety    Arthritis    neck -   BPH with obstruction/lower urinary tract symptoms    urologist-- dr pace   Cancer [facility])    basal cell to nose   Centrilobular emphysema ([place])    followed by dr Halford Chessman   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followed by dr Halford Chessman   Cough 07/2020   covid residual, no congestion   DOE (dyspnea on exertion)    due to copd/ emphysema   ED (erectile dysfunction)    Essential  tremor    neurologist--- dr tat----  bilateral upper extremities,  s/p DPS 12/ 2016   GERD (gastroesophageal reflux disease)    History of 2019 novel coronavirus disease (COVID-19) 07/18/2020   per pt postive home test 07-18-2020 w/ mild  symptoms per pt; pt stated had urgent care visit 07-24-2020 for sob/ cough with negative cxr (in care everywhere) ;  follow up pcp visit 08-03-2020 in epic   Insomnia    Mixed simple and mucopurulent chronic bronchitis (HCC)    OSA (obstructive sleep apnea)    followed by dr Halford Chessman---- no longer does uses bipap, intolerant --- pt prescriped  Oxygen  2 L oxygen via [place] while sleeping  (study in epic 04/ 2017 AHI 40)   Perennial allergic rhinitis    Pneumonia    Pulmonary nodules    followed by dr Halford Chessman   S/P deep brain stimulator placement 12/2014   followed by neurology-----IPG change  01-21-2020 for end-of life battery  (pt has a control)   Varicose vein of leg    per pt has had 3 procedure's last one 2006   Wears dentures    upper    Tobacco History: Social History   Tobacco Use  Smoking Status Former   Packs/day: 3.00   Years: 20.00   Additional pack years: 0.00   Total pack years: 60.00   Types: Cigarettes   Quit date: 1991   Years since quitting: 33.2  Smokeless Tobacco Never   Counseling given: Not Answered   Outpatient Medications Prior to Visit  Medication Sig Dispense Refill   Ascorbic Acid (VITAMIN C) 1000 MG tablet Take 1,000 mg by mouth in the morning.     azelastine (ASTELIN) 0.1 % nasal spray Place 1-2 sprays into both nostrils 2 (two) times daily as needed for rhinitis. 30 mL 5   Azelastine-Fluticasone 137-50 MCG/ACT SUSP Place 1 spray into the nose in the morning and at bedtime. 23 g 5   buPROPion (WELLBUTRIN XL) 150 MG 24 hr tablet Take 150 mg by mouth every morning.     Cholecalciferol (VITAMIN D3) 125 MCG (5000 UT) TABS Take 5,000 Units by mouth in the morning.     Coenzyme Q10 300 MG CAPS Take 300 mg by mouth in the  morning.     escitalopram (LEXAPRO) 20 MG tablet Take 1 tablet (20 mg total) by mouth daily. (Patient taking differently: Take 20 mg by mouth at bedtime.) 90 tablet 1   fluticasone (FLONASE) 50 MCG/ACT nasal spray Place 2 sprays into both nostrils daily as needed for allergies or rhinitis. 16 g 5   Fluticasone-Umeclidin-Vilant (TRELEGY ELLIPTA) 100-62.5-25 MCG/ACT AEPB Inhale 1 puff into the lungs daily. 28 each 0   montelukast (SINGULAIR) 10 MG tablet TAKE 1 TABLET BY MOUTH AT  BEDTIME 100 tablet 0   MYRBETRIQ 50 MG TB24 tablet Take 50 mg by mouth in the morning.     Omega-3 Fatty Acids (FISH OIL PO) Take 2,200 mg by mouth in the morning.     omeprazole (PRILOSEC) 40 MG capsule Take 40 mg, 1 capsule, by mouth once to twice daily 180 capsule 3   primidone (MYSOLINE) 50 MG tablet TAKE 3 TABLETS IN THE MORNING AND TAKE 1 TABLET IN THE EVENING 360 tablet 1   tadalafil (CIALIS) 20 MG tablet Take 1 tablet (20 mg total) by mouth daily as needed for erectile dysfunction. (Patient taking differently: Take 10 mg by mouth daily.) 90 tablet 3   tamsulosin (FLOMAX) 0.4 MG CAPS capsule Take 0.4 mg by mouth in the morning.     Testosterone Undecanoate 112.5 MG CAPS Take by mouth.     traZODone (DESYREL) 100 MG tablet TAKE 1 TABLET AT BEDTIME 90 tablet 1   TURMERIC PO Take 2,250 mg by mouth in the morning.     zaleplon (SONATA) 10 MG capsule Take 10 mg by mouth at bedtime.     Fluticasone-Umeclidin-Vilant (TRELEGY ELLIPTA) 100-62.5-25 MCG/ACT AEPB Inhale 1 puff into the lungs daily. (Patient not taking: Reported on 04/02/2022) 180 each 3   No facility-administered medications prior to visit.     Review of Systems:   Constitutional: No weight loss or gain, night sweats, fevers, chills, fatigue HEENT: No headaches, difficulty swallowing, tooth/dental problems, or sore throat. No sneezing, itching, ear ache, nasal congestion, post nasal drip CV:  No chest pain, orthopnea, swelling in lower extremities, PND,  anasarca, dizziness, palpitations, syncope Resp: +baseline dyspnea. No excess mucus  or change in color of mucus. No productive or non-productive. No hemoptysis. No wheezing.  No chest wall deformity GI:  No heartburn, indigestion, abdominal pain, nausea, vomiting, diarrhea, change in bowel habits, loss of appetite, bloody stools.  Skin: No rash, lesions, ulcerations MSK:  +left knee pain Neuro: No dizziness or lightheadedness.  Psych: No depression or anxiety. Mood stable.     Physical Exam:  BP 132/68 (BP Location: Left Arm, Patient Position: Sitting, Cuff Size: Normal)   Pulse 76   Ht 5\' 10"  (1.778 m)   Wt 207 lb (93.9 kg)   SpO2 97%   BMI 29.70 kg/m   GEN: Pleasant, interactive, well-appearing; in no acute distress. HEENT:  Normocephalic and atraumatic. PERRLA. Sclera white. Nasal turbinates boggy, moist and patent bilaterally. No rhinorrhea present. Oropharynx pink and moist, without exudate or edema. No lesions, ulcerations NECK:  Supple w/ fair ROM. No JVD present. Normal carotid impulses w/o bruits. Thyroid symmetrical with no goiter or nodules palpated. No lymphadenopathy.   CV: RRR, no m/r/g, no peripheral edema. Pulses intact, +2 bilaterally. No cyanosis, pallor or clubbing. PULMONARY:  Unlabored, regular breathing. Clear bilaterally A&P w/o wheezes/rales/rhonchi. No accessory muscle use. No dullness to percussion. GI: BS present and normoactive. Soft, non-tender to palpation. No organomegaly or masses detected. MSK: No erythema, warmth or tenderness. Cap refil <2 sec all extrem. No deformities or joint swelling noted.  Neuro: A/Ox3. No focal deficits noted.   Skin: Warm, no lesions or rashe Psych: Normal affect and behavior. Judgement and thought content appropriate.     Lab Results:  CBC    Component Value Date/Time   WBC 6.7 08/22/2021 0906   RBC 4.17 (L) 08/22/2021 0906   HGB 13.1 08/22/2021 0906   HCT 40.8 08/22/2021 0906   PLT 277 08/22/2021 0906   MCV 97.8  08/22/2021 0906   MCH 31.4 08/22/2021 0906   MCHC 32.1 08/22/2021 0906   RDW 13.6 08/22/2021 0906   LYMPHSABS 1.0 07/20/2021 1010   MONOABS 0.7 07/20/2021 1010   EOSABS 0.1 07/20/2021 1010   BASOSABS 0.1 07/20/2021 1010    BMET    Component Value Date/Time   NA 139 08/22/2021 0906   K 4.8 08/22/2021 0906   CL 108 08/22/2021 0906   CO2 26 08/22/2021 0906   GLUCOSE 110 (H) 08/22/2021 0906   BUN 15 08/22/2021 0906   CREATININE 1.21 08/22/2021 0906   CALCIUM 9.6 08/22/2021 0906   GFRNONAA >60 08/22/2021 0906    BNP No results found for: "BNP"   Imaging:  No results found.       Latest Ref Rng & Units 04/20/2019   11:33 AM  PFT Results  FVC-Pre L 2.82   FVC-Predicted Pre % 62   FVC-Post L 2.66   FVC-Predicted Post % 59   Pre FEV1/FVC % % 74   Post FEV1/FCV % % 76   FEV1-Pre L 2.09   FEV1-Predicted Pre % 62   FEV1-Post L 2.01   DLCO uncorrected ml/min/mmHg 24.64   DLCO UNC% % 93   DLCO corrected ml/min/mmHg 25.89   DLCO COR %Predicted % 98   DLVA Predicted % 144   TLC L 5.33   TLC % Predicted % 75   RV % Predicted % 94     No results found for: "NITRICOXIDE"      Assessment & Plan:   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([place]) Resolved mild exacerbation. Clinically improved and back to baseline. Compensated on current regimen. Provided with z pack and  prednisone taper to take on his trip to [place]. He understands not to use these unless he develops a flare, which he knows the s/s of. Side effect profile reviewed. He will continue on triple therapy regimen and singulair for trigger prevention. Action plan in place.   Patient Instructions  Continue Azelastine/fluticasone 1 spray each nostril Twice daily  Continue saline nasal rinses 1-2 times a day for nasal congestion. Do this 20-30 min before your nasal spray  Continue claritin 1 tab daily   Continue Trelegy 1 puff daily. Brush tongue and rinse mouth afterwards Continue montelukast 1 tab daily  Continue omeprazole 1  capsule Twice daily for reflux  I sent a z pack and prednisone burst for you to have on hand when you go to [place]   Follow up in 3 months with Dr. Halford Chessman or Katie Yuma Blucher,NP. If symptoms do not improve or worsen, please contact office for sooner follow up or seek emergency care.    Allergic rhinitis Improved. We will continue his current nasal sprays and daily antihistamine.   Pulmonary nodule less than 6 mm in diameter with high risk for malignant neoplasm Former heavy smoker. Small, scattered nodules. Stable from 2021-2022. No CT chest imaging documented since. Orders for repeat CT placed today.   Mild obstructive sleep apnea Mild OSA 04/2021. He is down 40 lb since his study. Symptoms have resolved. He will continue to monitor symptoms and work on maintaining healthy weight lose.      I spent 28 minutes of dedicated to the care of this patient on the date of this encounter to include pre-visit review of records, face-to-face time with the patient discussing conditions above, post visit ordering of testing, clinical documentation with the electronic health record, making appropriate referrals as documented, and communicating necessary findings to members of the patients care team.  Clayton Bibles, NP 04/02/2022  Pt aware and understands NP's role.

## 2022-04-02 NOTE — Assessment & Plan Note (Signed)
Resolved mild exacerbation. Clinically improved and back to baseline. Compensated on current regimen. Provided with z pack and prednisone taper to take on his trip to [place]. He understands not to use these unless he develops a flare, which he knows the s/s of. Side effect profile reviewed. He will continue on triple therapy regimen and singulair for trigger prevention. Action plan in place.   Patient Instructions  Continue Azelastine/fluticasone 1 spray each nostril Twice daily  Continue saline nasal rinses 1-2 times a day for nasal congestion. Do this 20-30 min before your nasal spray  Continue claritin 1 tab daily   Continue Trelegy 1 puff daily. Brush tongue and rinse mouth afterwards Continue montelukast 1 tab daily  Continue omeprazole 1 capsule Twice daily for reflux  I sent a z pack and prednisone burst for you to have on hand when you go to [place]   Follow up in 3 months with Dr. Halford Chessman or Katie Johnathen Testa,NP. If symptoms do not improve or worsen, please contact office for sooner follow up or seek emergency care.

## 2022-04-02 NOTE — Patient Instructions (Addendum)
Continue Azelastine/fluticasone 1 spray each nostril Twice daily  Continue saline nasal rinses 1-2 times a day for nasal congestion. Do this 20-30 min before your nasal spray  Continue claritin 1 tab daily   Continue Trelegy 1 puff daily. Brush tongue and rinse mouth afterwards Continue montelukast 1 tab daily  Continue omeprazole 1 capsule Twice daily for reflux  I sent a z pack and prednisone burst for you to have on hand when you go to [place]   Follow up in 3 months with Dr. Halford Chessman or Katie Jaevion Goto,NP. If symptoms do not improve or worsen, please contact office for sooner follow up or seek emergency care.

## 2022-04-02 NOTE — Progress Notes (Signed)
Reviewed and agree with assessment/plan.   Chesley Mires, MD [facility] Pulmonary/Critical Care 04/02/2022, 12:18 PM Pager:  [phone]

## 2022-04-03 ENCOUNTER — Telehealth: Payer: Self-pay | Admitting: Family Medicine

## 2022-04-03 ENCOUNTER — Other Ambulatory Visit: Payer: Self-pay

## 2022-04-03 MED ORDER — PREDNISONE 20 MG PO TABS: 40.0000 mg | ORAL_TABLET | Freq: Every day | ORAL | 0 refills | Status: DC

## 2022-04-03 NOTE — Telephone Encounter (Signed)
Spoke with patient. Rx filled.  

## 2022-04-03 NOTE — Telephone Encounter (Signed)
Patient called stating that something popped in his meniscus area and he is in quite a bit of pain.  He is leaving for [place] tomorrow and asked if Dr Tamala Julian could send in an anti-inflammatory?   Please advise.

## 2022-04-06 ENCOUNTER — Other Ambulatory Visit (HOSPITAL_COMMUNITY): Payer: Medicare Other

## 2022-04-10 NOTE — Progress Notes (Addendum)
[facility] [address] [place] [address] Phone: [phone] Subjective:   Allen [name], am serving as a scribe for Dr. Antoine Primas.  I'm seeing this patient by the request  of:  Philip Aspen, [name], MD  CC: left knee pain   BJY:[id number]  03/28/2022 Patient was told from an outside facility that he did have some degeneration of the left knee that was fairly severe.  Given an injection yesterday by another provider.  Still has some instability with valgus and varus force.  Hinged brace given today.  Patient has done well and lost weight and is continuing to do so so would wait to make any custom brace while patient continues with the weight loss.  Discussed with patient about other treatment options including the potential for viscosupplementation.  Increase activity slowly otherwise.  Follow-up again in 6 to 8 weeks   Update 04/18/2022 Allen [name] is a 72 y.o. male coming in with complaint of L knee pain. Durolane authorized. Patient states that since last visit he was walking his dog and felt a pop which caused him to fall to the ground. Feels like pain is 8/10 when he stands up from the chair and is otherwise a constant 5/10.      Past Medical History:  Diagnosis Date   Anxiety    Arthritis    neck -   BPH with obstruction/lower urinary tract symptoms    urologist-- dr pace   Cancer [facility])    basal cell to nose   Centrilobular emphysema (HCC)    followed by dr Craige Cotta   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followed by dr Craige Cotta   Cough 07/2020   covid residual, no congestion   DOE (dyspnea on exertion)    due to copd/ emphysema   ED (erectile dysfunction)    Essential tremor    neurologist--- dr tat----  bilateral upper extremities,  s/p DPS 12/ 2016   GERD (gastroesophageal reflux disease)    History of 2019 novel coronavirus disease (COVID-19) 07/18/2020   per pt postive home test 07-18-2020 w/ mild  symptoms per pt; pt stated had urgent  care visit 07-24-2020 for sob/ cough with negative cxr (in care everywhere) ;  follow up pcp visit 08-03-2020 in epic   Insomnia    Mixed simple and mucopurulent chronic bronchitis (HCC)    OSA (obstructive sleep apnea)    followed by dr Craige Cotta---- no longer does uses bipap, intolerant --- pt prescriped  Oxygen  2 L oxygen via [place] while sleeping  (study in epic 04/ 2017 [facility])   Perennial allergic rhinitis    Pneumonia    Pulmonary nodules    followed by dr Craige Cotta   S/P deep brain stimulator placement 12/2014   followed by neurology-----IPG change 01-21-2020 for end-of life battery  (pt has a control)   Varicose vein of leg    per pt has had 3 procedure's last one 2006   Wears dentures    upper   Past Surgical History:  Procedure Laterality Date   ANKLE SURGERY Right 2020   tendon repair   APPENDECTOMY     CARPAL TUNNEL RELEASE Left 2019   CATARACT EXTRACTION W/ INTRAOCULAR LENS IMPLANT Bilateral 2020   COLONOSCOPY     CYSTOSCOPY WITH URETHRAL DILATATION N/A 08/29/2021   Procedure: CYSTOSCOPY WITH URETHRAL DILATATION;  Surgeon: Noel Christmas, MD;  Location: [facility];  Service: Urology;  Laterality: N/A;   DEEP BRAIN  STIMULATOR PLACEMENT Left 2016   INGUINAL HERNIA REPAIR Right 2007   ROTATOR CUFF REPAIR Right 2014   SUBTHALAMIC STIMULATOR BATTERY REPLACEMENT N/A 01/21/2020   Procedure: Deep brain stimulator battery replacement;  Surgeon: Maeola Harman, MD;  Location: [facility] OR;  Service: Neurosurgery;  Laterality: N/A;   TONSILLECTOMY AND ADENOIDECTOMY     age 11   TOTAL SHOULDER REPLACEMENT Left 2016   TRANSURETHRAL RESECTION OF PROSTATE N/A 08/09/2020   Procedure: TRANSURETHRAL RESECTION OF THE PROSTATE (TURP), BLADDER BIOPSY AND FULGERATION;  Surgeon: Noel Christmas, MD;  Location: [facility];  Service: Urology;  Laterality: N/A;   TRANSURETHRAL RESECTION OF PROSTATE N/A 08/29/2021   Procedure: RE-TRANSURETHRAL RESECTION OF THE PROSTATE (TURP);  Surgeon: Noel Christmas, MD;  Location: [facility];  Service: Urology;  Laterality: N/A;   VEIN SURGERY Left 2006   varicose veins   Social History   Socioeconomic History   Marital status: Married    Spouse name: Not on file   Number of children: 3   Years of education: Not on file   Highest education level: Associate degree: occupational, Scientist, product/process development, or vocational program  Occupational History   Not on file  Tobacco Use   Smoking status: Former    Packs/day: 3.00    Years: 20.00    Additional pack years: 0.00    Total pack years: 60.00    Types: Cigarettes    Quit date: 1991    Years since quitting: 33.2   Smokeless tobacco: Never  Vaping Use   Vaping Use: Never used  Substance and Sexual Activity   Alcohol use: Yes    Alcohol/week: 1.0 standard drink of alcohol    Types: 1 Glasses of wine per week    Comment: occassionally   Drug use: Never   Sexual activity: Yes  Other Topics Concern   Not on file  Social History Narrative   Right handed   Drinks caffeine   One level home   Retired   Lives with wife   Social Determinants of Health   Financial Resource Strain: Low Risk  (03/20/2021)   Overall Financial Resource Strain (CARDIA)    Difficulty of Paying Living Expenses: Not hard at all  Food Insecurity: Not on file  Transportation Needs: No Transportation Needs (03/20/2021)   PRAPARE - Administrator, Civil Service (Medical): No    Lack of Transportation (Non-Medical): No  Physical Activity: Not on file  Stress: Not on file  Social Connections: Not on file   Allergies  Allergen Reactions   Copper-Containing Compounds     Copper gloves caused redness and blisters bilateral hands   Family History  Problem Relation Age of Onset   COPD Mother    Asthma Father    Rectal cancer Sister    Healthy Child    Urticaria Neg Hx    Immunodeficiency Neg Hx    Eczema Neg Hx    Atopy Neg Hx    Angioedema Neg Hx    Allergic rhinitis Neg Hx     Current Outpatient  Medications (Endocrine & Metabolic):    predniSONE (DELTASONE) 20 MG tablet, Take 2 tablets (40 mg total) by mouth daily with breakfast.   Testosterone Undecanoate 112.5 MG CAPS, Take by mouth.  Current Outpatient Medications (Cardiovascular):    tadalafil (CIALIS) 20 MG tablet, Take 1 tablet (20 mg total) by mouth daily as needed for erectile dysfunction. (Patient taking differently: Take 10 mg by mouth daily.)  Current Outpatient  Medications (Respiratory):    azelastine (ASTELIN) 0.1 % nasal spray, Place 1-2 sprays into both nostrils 2 (two) times daily as needed for rhinitis.   Azelastine-Fluticasone 137-50 MCG/ACT SUSP, Place 1 spray into the nose in the morning and at bedtime.   fluticasone (FLONASE) 50 MCG/ACT nasal spray, Place 2 sprays into both nostrils daily as needed for allergies or rhinitis.   Fluticasone-Umeclidin-Vilant (TRELEGY ELLIPTA) 100-62.5-25 MCG/ACT AEPB, Inhale 1 puff into the lungs daily.   Fluticasone-Umeclidin-Vilant (TRELEGY ELLIPTA) 100-62.5-25 MCG/ACT AEPB, Inhale 1 puff into the lungs daily.   montelukast (SINGULAIR) 10 MG tablet, TAKE 1 TABLET BY MOUTH AT  BEDTIME    Current Outpatient Medications (Other):    Ascorbic Acid (VITAMIN C) 1000 MG tablet, Take 1,000 mg by mouth in the morning.   azithromycin (ZITHROMAX) 250 MG tablet, Take 2 tablets on day one then take 1 tablet daily for four additional days   buPROPion (WELLBUTRIN XL) 150 MG 24 hr tablet, Take 150 mg by mouth every morning.   Cholecalciferol (VITAMIN D3) 125 MCG (5000 UT) TABS, Take 5,000 Units by mouth in the morning.   Coenzyme Q10 300 MG CAPS, Take 300 mg by mouth in the morning.   escitalopram (LEXAPRO) 20 MG tablet, Take 1 tablet (20 mg total) by mouth daily. (Patient taking differently: Take 20 mg by mouth at bedtime.)   MYRBETRIQ 50 MG TB24 tablet, Take 50 mg by mouth in the morning.   Omega-3 Fatty Acids (FISH OIL PO), Take 2,200 mg by mouth in the morning.   omeprazole (PRILOSEC) 40 MG  capsule, Take 40 mg, 1 capsule, by mouth once to twice daily   primidone (MYSOLINE) 50 MG tablet, TAKE 3 TABLETS IN THE MORNING AND TAKE 1 TABLET IN THE EVENING   tamsulosin (FLOMAX) 0.4 MG CAPS capsule, Take 0.4 mg by mouth in the morning.   traZODone (DESYREL) 100 MG tablet, TAKE 1 TABLET AT BEDTIME   TURMERIC PO, Take 2,250 mg by mouth in the morning.   zaleplon (SONATA) 10 MG capsule, Take 10 mg by mouth at bedtime.   Reviewed prior external information including notes and imaging from  primary care provider As well as notes that were available from care everywhere and other healthcare systems.  Past medical history, social, surgical and family history all reviewed in electronic medical record.  No pertanent information unless stated regarding to the chief complaint.   Review of Systems:  No headache, visual changes, nausea, vomiting, diarrhea, constipation, dizziness, abdominal pain, skin rash, fevers, chills, night sweats, weight loss, swollen lymph nodes, body aches, joint swelling, chest pain, shortness of breath, mood changes. POSITIVE muscle aches  Objective  Blood pressure 122/84, pulse 70, height 5\' 10"  (1.778 m), weight 212 lb (96.2 kg), SpO2 98 %.   General: No apparent distress alert and oriented x3 mood and affect normal, dressed appropriately.  HEENT: Pupils equal, extraocular movements intact  Respiratory: Patient's speak in full sentences and does not appear short of breath  Cardiovascular: No lower extremity edema, non tender, no erythema  Left knee exam shows significant antalgic gait noted.  Tender to palpation mostly over the medial joint line and the pes anserine area.  No crepitus noted with range of motion.  Severe swelling lacking the last 15 degrees of flexion.  Instability noted with valgus and varus force patient is ambulatory  Procedure: Real-time Ultrasound Guided Injection of left knee Device: GE Logiq Q7 Ultrasound guided injection is preferred based  studies that show increased duration, increased  effect, greater accuracy, decreased procedural pain, increased response rate, and decreased cost with ultrasound guided versus blind injection.  Verbal informed consent obtained.  Time-out conducted.  Noted no overlying erythema, induration, or other signs of local infection.  Skin prepped in a sterile fashion.  Local anesthesia: Topical Ethyl chloride.  With sterile technique and under real time ultrasound guidance: With a 22-gauge 2 inch needle patient was injected with 2 cc of 0.5% Marcaine and aspirated 45 cc of straw light-colored fluid then injected 1 cc of Kenalog 40 mg/dL. This was from a superior lateral approach.  Completed without difficulty  Pain immediately improved but still noticed suggesting accurate placement of the medication.  Advised to call if fevers/chills, erythema, induration, drainage, or persistent bleeding.  Impression: Technically successful ultrasound guided injection.    Impression and Recommendations:     The above documentation has been reviewed and is accurate and complete Judi Saa, DO

## 2022-04-18 ENCOUNTER — Other Ambulatory Visit: Payer: Self-pay

## 2022-04-18 ENCOUNTER — Ambulatory Visit (INDEPENDENT_AMBULATORY_CARE_PROVIDER_SITE_OTHER): Payer: Medicare Other

## 2022-04-18 ENCOUNTER — Encounter: Payer: Self-pay | Admitting: Family Medicine

## 2022-04-18 ENCOUNTER — Ambulatory Visit: Payer: Medicare Other | Admitting: Family Medicine

## 2022-04-18 VITALS — BP 122/84 | HR 70 | Ht 70.0 in | Wt 212.0 lb

## 2022-04-18 DIAGNOSIS — M25562 Pain in left knee: Secondary | ICD-10-CM | POA: Diagnosis not present

## 2022-04-18 DIAGNOSIS — G8929 Other chronic pain: Secondary | ICD-10-CM

## 2022-04-18 DIAGNOSIS — M25561 Pain in right knee: Secondary | ICD-10-CM

## 2022-04-18 DIAGNOSIS — M25462 Effusion, left knee: Secondary | ICD-10-CM

## 2022-04-18 NOTE — Assessment & Plan Note (Addendum)
Aspiration done today, will hold on any type of viscosupplementation secondary to the severity of the swelling at the moment.  Patient does have some instability of the knee it feels like.  Will get an MRI to further evaluate. Due to patient having a deep brain stimulator that we will need to do this in the hospital setting.  Will try to get this approved in the near future.  Discussed icing regimen and home exercises otherwise.  Follow-up with me again in 6 to 8 weeks Patient is ambulatory

## 2022-04-18 NOTE — Patient Instructions (Addendum)
Drained knee today I hope it helps Will order MRI of the knee at [facility] but if better can cancel Have an appointment in 5-6 weeks just in case before your trip

## 2022-04-20 ENCOUNTER — Ambulatory Visit (HOSPITAL_COMMUNITY)
Admission: RE | Admit: 2022-04-20 | Discharge: 2022-04-20 | Disposition: A | Discharge status: Home / Self Care | Payer: Medicare Other | Source: Ambulatory Visit | Attending: Nurse Practitioner | Admitting: Nurse Practitioner

## 2022-04-20 DIAGNOSIS — R918 Other nonspecific abnormal finding of lung field: Secondary | ICD-10-CM | POA: Diagnosis not present

## 2022-04-20 DIAGNOSIS — J439 Emphysema, unspecified: Secondary | ICD-10-CM | POA: Diagnosis not present

## 2022-04-23 ENCOUNTER — Encounter (HOSPITAL_BASED_OUTPATIENT_CLINIC_OR_DEPARTMENT_OTHER): Payer: Self-pay | Admitting: Pulmonary Disease

## 2022-04-23 ENCOUNTER — Encounter: Payer: Self-pay | Admitting: Family Medicine

## 2022-04-27 ENCOUNTER — Other Ambulatory Visit: Payer: Self-pay | Admitting: Nurse Practitioner

## 2022-04-27 DIAGNOSIS — I517 Cardiomegaly: Secondary | ICD-10-CM

## 2022-04-27 NOTE — Progress Notes (Signed)
I called and spoke with the pt and notified of results/recs per [facility]. Pt verbalized understanding. He was agreeable to cards referral and ECHO.

## 2022-04-27 NOTE — Progress Notes (Signed)
Previously visualized nodules are barely visible on current exam. No acute process in the chest. He does have enlargement of his heart and what looks like either a very small amount of fluid around it or some thickening or the sack the heart sits in. There was no mention of this on his previous CT from 2022. I have sent a referral to cardiology and ordered an echocardiogram. Thanks.

## 2022-04-29 ENCOUNTER — Encounter (HOSPITAL_BASED_OUTPATIENT_CLINIC_OR_DEPARTMENT_OTHER): Payer: Self-pay

## 2022-04-30 NOTE — Telephone Encounter (Signed)
NP from pulmonary team has ordered echocardiogram. This will allow [place] to assess the function of the heart muscle/valves. Recommend proceed with scheduling echocardiogram and follow up with Dr. Cristal Deer afterward.   Alver Sorrow, NP

## 2022-04-30 NOTE — Telephone Encounter (Signed)
Allen [name], can you help me get this patient scheduled for an echo and then follow up with Dr. Cristal Deer after? Thanks!

## 2022-05-01 ENCOUNTER — Ambulatory Visit (HOSPITAL_COMMUNITY): Payer: Medicare Other | Attending: Internal Medicine

## 2022-05-01 DIAGNOSIS — I517 Cardiomegaly: Secondary | ICD-10-CM | POA: Diagnosis not present

## 2022-05-01 DIAGNOSIS — D631 Anemia in chronic kidney disease: Secondary | ICD-10-CM | POA: Diagnosis not present

## 2022-05-01 DIAGNOSIS — R0781 Pleurodynia: Secondary | ICD-10-CM | POA: Diagnosis not present

## 2022-05-01 DIAGNOSIS — Z91199 Patient's noncompliance with other medical treatment and regimen due to unspecified reason: Secondary | ICD-10-CM | POA: Diagnosis not present

## 2022-05-01 DIAGNOSIS — Z794 Long term (current) use of insulin: Secondary | ICD-10-CM | POA: Diagnosis not present

## 2022-05-01 DIAGNOSIS — E559 Vitamin D deficiency, unspecified: Secondary | ICD-10-CM | POA: Diagnosis not present

## 2022-05-01 DIAGNOSIS — N184 Chronic kidney disease, stage 4 (severe): Secondary | ICD-10-CM | POA: Diagnosis not present

## 2022-05-01 DIAGNOSIS — E782 Mixed hyperlipidemia: Secondary | ICD-10-CM | POA: Diagnosis not present

## 2022-05-01 DIAGNOSIS — I739 Peripheral vascular disease, unspecified: Secondary | ICD-10-CM | POA: Diagnosis not present

## 2022-05-01 DIAGNOSIS — I5031 Acute diastolic (congestive) heart failure: Secondary | ICD-10-CM | POA: Diagnosis not present

## 2022-05-01 DIAGNOSIS — I2510 Atherosclerotic heart disease of native coronary artery without angina pectoris: Secondary | ICD-10-CM | POA: Diagnosis not present

## 2022-05-01 DIAGNOSIS — I10 Essential (primary) hypertension: Secondary | ICD-10-CM | POA: Diagnosis not present

## 2022-05-01 LAB — ECHOCARDIOGRAM COMPLETE
Area-P 1/2: 2.61 cm2
S' Lateral: 3.4 cm

## 2022-05-04 ENCOUNTER — Ambulatory Visit (HOSPITAL_COMMUNITY): Payer: Medicare Other

## 2022-05-04 ENCOUNTER — Ambulatory Visit (HOSPITAL_COMMUNITY)
Admission: RE | Admit: 2022-05-04 | Discharge: 2022-05-04 | Disposition: A | Discharge status: Home / Self Care | Payer: Medicare Other | Source: Ambulatory Visit | Attending: Family Medicine | Admitting: Family Medicine

## 2022-05-04 DIAGNOSIS — M25562 Pain in left knee: Secondary | ICD-10-CM | POA: Diagnosis not present

## 2022-05-04 DIAGNOSIS — G894 Chronic pain syndrome: Secondary | ICD-10-CM | POA: Diagnosis not present

## 2022-05-04 DIAGNOSIS — Z89512 Acquired absence of left leg below knee: Secondary | ICD-10-CM | POA: Diagnosis not present

## 2022-05-04 DIAGNOSIS — K219 Gastro-esophageal reflux disease without esophagitis: Secondary | ICD-10-CM | POA: Diagnosis not present

## 2022-05-04 DIAGNOSIS — M79672 Pain in left foot: Secondary | ICD-10-CM | POA: Diagnosis not present

## 2022-05-04 DIAGNOSIS — R52 Pain, unspecified: Secondary | ICD-10-CM | POA: Diagnosis not present

## 2022-05-04 DIAGNOSIS — G548 Other nerve root and plexus disorders: Secondary | ICD-10-CM | POA: Diagnosis not present

## 2022-05-04 NOTE — Progress Notes (Signed)
Please notify patient that his echocardiogram looked okay. Pumping function was low normal at 50-55%. Slightly decreased compared to prior but not a drastic change. He had some mild impaired relaxation and common with age. Right side of the heart looks good. Nothing of major concern. He will discuss further with cardiology at his appt on 5/3 but likely just something to monitor. Thanks.

## 2022-05-07 ENCOUNTER — Ambulatory Visit: Payer: Medicare HMO | Admitting: Dermatology

## 2022-05-07 ENCOUNTER — Telehealth: Payer: Self-pay

## 2022-05-07 NOTE — Progress Notes (Signed)
Care Management & Coordination Services Pharmacy Team  Reason for Encounter: COPD  Contacted patient to discuss COPD disease state. Spoke with patient on 05/07/2022   Current COPD regimen:  TRELEGY ELLIPTA Inhale 1 puff into the lungs daily  Singulair 10 mg at bedtime Albuterol HFA prn  Any recent hospitalizations or ED visits since last visit with CPP? No recent hospital visits.  Patient denies COPD symptoms  What recent interventions/DTPs have been made by any provider to improve breathing since last visit: No recent intervention. Patient has lost weight which has helped his symptoms  Have you had exacerbation/flare-up since last visit? Patient denies  What do you do when you are short of breath?  Patient states he almost never needs to use his rescue inhaler.   Respiratory Devices/Equipment Do you have a nebulizer? No Do you use a Peak Flow Meter? No Do you use a maintenance inhaler? Yes How often do you forget to use your daily inhaler? No Do you use a rescue inhaler? Very rare Do you use a spacer with your inhaler? No  Adherence Review: Does the patient have >5 day gap between last estimated fill date for maintenance inhaler medications? No  Care Gaps: AWV - completed 07/20/2021 Last BP - 122/84 on 04/18/2022 Hep C screen - never done Pneumovax - overdue Covid - overdue  Star Rating Drug: None  Chart Review:  Recent office visits:  None  Recent consult visits:  04/18/2022 Antoine Primas DO (sport med) - Patient was seen for acute pain of left knee. No medication changes.   04/02/2022 Micheline Maze NP - Patient was seen for 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and additional concerns. Started Zpac and Prednisone.   03/28/2022 Antoine Primas DO (sport med) - Patient was seen for degenerative disc disease, cervical and additional concerns. No medication changes.   03/22/2022 Micheline Maze NP - Patient was seen for lung nodules and additional concerns. Started Azelastine  Fluticasone nasal spray and prednisone 40 mg daily.   02/22/2022 Helane Gunther DPM - Patient was seen for pain due to onychomycosis of toenails both feet. No medication changes.  02/05/2022 Lurena Joiner Tat DO (neurology) - Patient was seen for essential tremor and an additional concern. No medication changes.   01/24/2022 Zannie Kehr (derm) - Patient was seen for melanocytic nevi of trunk and additional concerns. No additional chart notes.   01/22/2022 Ventura Sellers MD (urology) - Patient was seen for Erectile disorder due to medical condition in male patient. Started Tlando112.5 mg take two twice daily.   01/18/2022 Serena Colonel MD(ENT) - Patient was seen for Laryngopharyngeal reflux and an additional concern. No medication changes.   Hospital visits:  None  Medications: Outpatient Encounter Medications as of 05/07/2022  Medication Sig Note   Ascorbic Acid (VITAMIN C) 1000 MG tablet Take 1,000 mg by mouth in the morning. 08/17/2021: On hold due to upcoming procedure.    azelastine (ASTELIN) 0.1 % nasal spray Place 1-2 sprays into both nostrils 2 (two) times daily as needed for rhinitis. 08/17/2021: During March & April    Azelastine-Fluticasone 137-50 MCG/ACT SUSP Place 1 spray into the nose in the morning and at bedtime.    azithromycin (ZITHROMAX) 250 MG tablet Take 2 tablets on day one then take 1 tablet daily for four additional days    buPROPion (WELLBUTRIN XL) 150 MG 24 hr tablet Take 150 mg by mouth every morning.    Cholecalciferol (VITAMIN D3) 125 MCG (5000 UT) TABS Take 5,000 Units by mouth in the morning.  Coenzyme Q10 300 MG CAPS Take 300 mg by mouth in the morning.    escitalopram (LEXAPRO) 20 MG tablet Take 1 tablet (20 mg total) by mouth daily. (Patient taking differently: Take 20 mg by mouth at bedtime.)    fluticasone (FLONASE) 50 MCG/ACT nasal spray Place 2 sprays into both nostrils daily as needed for allergies or rhinitis. 08/17/2021: During March & April      Fluticasone-Umeclidin-Vilant (TRELEGY ELLIPTA) 100-62.5-25 MCG/ACT AEPB Inhale 1 puff into the lungs daily.    Fluticasone-Umeclidin-Vilant (TRELEGY ELLIPTA) 100-62.5-25 MCG/ACT AEPB Inhale 1 puff into the lungs daily.    montelukast (SINGULAIR) 10 MG tablet TAKE 1 TABLET BY MOUTH AT  BEDTIME    MYRBETRIQ 50 MG TB24 tablet Take 50 mg by mouth in the morning.    Omega-3 Fatty Acids (FISH OIL PO) Take 2,200 mg by mouth in the morning.    omeprazole (PRILOSEC) 40 MG capsule Take 40 mg, 1 capsule, by mouth once to twice daily    predniSONE (DELTASONE) 20 MG tablet Take 2 tablets (40 mg total) by mouth daily with breakfast.    primidone (MYSOLINE) 50 MG tablet TAKE 3 TABLETS IN THE MORNING AND TAKE 1 TABLET IN THE EVENING    tadalafil (CIALIS) 20 MG tablet Take 1 tablet (20 mg total) by mouth daily as needed for erectile dysfunction. (Patient taking differently: Take 10 mg by mouth daily.)    tamsulosin (FLOMAX) 0.4 MG CAPS capsule Take 0.4 mg by mouth in the morning.    Testosterone Undecanoate 112.5 MG CAPS Take by mouth.    traZODone (DESYREL) 100 MG tablet TAKE 1 TABLET AT BEDTIME    TURMERIC PO Take 2,250 mg by mouth in the morning.    zaleplon (SONATA) 10 MG capsule Take 10 mg by mouth at bedtime.    No facility-administered encounter medications on file as of 05/07/2022.  Fill History:  Dispensed Days Supply Quantity Provider Pharmacy  AZELASTIN-FLUTIC 137-50MCG SPR 04/18/2022 30 23 g      Dispensed Days Supply Quantity Provider Pharmacy  BUPROPION HYDROCHLORIDE ER (XL)  300 MG TB24 04/01/2022 90 90 tablet      Dispensed Days Supply Quantity Provider Pharmacy  ESCITALOPRAM OXALATE  20 MG TABS 04/01/2022 90 90 tablet      Dispensed Days Supply Quantity Provider Pharmacy  TRELEGY ELLIPTA  100-62.5-25 MCG/ACT AEPB 04/04/2022 90 180 each      Dispensed Days Supply Quantity Provider Pharmacy  MYRBETRIQ  50 MG TB24 04/04/2022 100 100 tablet      Dispensed Days Supply Quantity Provider  Pharmacy  MONTELUKAST SODIUM  10 MG TABS 03/06/2022 100 100 tablet      Dispensed Days Supply Quantity Provider Pharmacy  OMEPRAZOLE  40 MG CPDR 04/04/2022 100 200 capsule      Dispensed Days Supply Quantity Provider Pharmacy  PRIMIDONE  50 MG TABS 03/11/2022 100 400       Dispensed Days Supply Quantity Provider Pharmacy  TADALAFIL       TAB 01/21/2021 90 90 each      Dispensed Days Supply Quantity Provider Pharmacy  TAMSULOSIN HYDROCHLORIDE  0.4 MG CAPS 04/08/2022 100 100 capsule      Dispensed Days Supply Quantity Provider Pharmacy  ZALEPLON  10 MG CAPS 04/08/2022 90 90 capsule      Inetta Fermo [facility]  Clinical Pharmacist Assistant [phone]

## 2022-05-09 ENCOUNTER — Encounter: Payer: Self-pay | Admitting: Family Medicine

## 2022-05-09 NOTE — Progress Notes (Signed)
Tried calling the pt and the line was busy

## 2022-05-10 ENCOUNTER — Ambulatory Visit: Payer: Medicare Other | Admitting: Family Medicine

## 2022-05-10 ENCOUNTER — Other Ambulatory Visit: Payer: Self-pay

## 2022-05-10 DIAGNOSIS — M1712 Unilateral primary osteoarthritis, left knee: Secondary | ICD-10-CM

## 2022-05-10 MED ORDER — PREDNISONE 20 MG PO TABS: 20.0000 mg | ORAL_TABLET | Freq: Every day | ORAL | 0 refills | Status: DC

## 2022-05-11 NOTE — Progress Notes (Signed)
Spoke with pt and notified of results per Katie.  Pt verbalized understanding and denied any questions. 

## 2022-05-15 ENCOUNTER — Other Ambulatory Visit: Payer: Self-pay | Admitting: Neurology

## 2022-05-15 DIAGNOSIS — G250 Essential tremor: Secondary | ICD-10-CM

## 2022-05-17 ENCOUNTER — Encounter: Payer: Self-pay | Admitting: Internal Medicine

## 2022-05-17 ENCOUNTER — Encounter: Payer: Self-pay | Admitting: Family Medicine

## 2022-05-17 ENCOUNTER — Encounter (HOSPITAL_BASED_OUTPATIENT_CLINIC_OR_DEPARTMENT_OTHER): Payer: Self-pay | Admitting: Pulmonary Disease

## 2022-05-17 ENCOUNTER — Encounter: Payer: Self-pay | Admitting: Neurology

## 2022-05-17 DIAGNOSIS — S83242A Other tear of medial meniscus, current injury, left knee, initial encounter: Secondary | ICD-10-CM | POA: Diagnosis not present

## 2022-05-17 DIAGNOSIS — M25462 Effusion, left knee: Secondary | ICD-10-CM | POA: Diagnosis not present

## 2022-05-18 ENCOUNTER — Encounter (HOSPITAL_BASED_OUTPATIENT_CLINIC_OR_DEPARTMENT_OTHER): Payer: Self-pay | Admitting: Cardiology

## 2022-05-18 ENCOUNTER — Ambulatory Visit (HOSPITAL_BASED_OUTPATIENT_CLINIC_OR_DEPARTMENT_OTHER): Payer: Medicare Other | Admitting: Cardiology

## 2022-05-18 VITALS — BP 116/72 | HR 57 | Ht 70.0 in | Wt 211.4 lb

## 2022-05-18 DIAGNOSIS — R0609 Other forms of dyspnea: Secondary | ICD-10-CM | POA: Diagnosis not present

## 2022-05-18 DIAGNOSIS — Z712 Person consulting for explanation of examination or test findings: Secondary | ICD-10-CM | POA: Diagnosis not present

## 2022-05-18 DIAGNOSIS — Z01810 Encounter for preprocedural cardiovascular examination: Secondary | ICD-10-CM | POA: Diagnosis not present

## 2022-05-18 DIAGNOSIS — Z7189 Other specified counseling: Secondary | ICD-10-CM

## 2022-05-18 DIAGNOSIS — I8393 Asymptomatic varicose veins of bilateral lower extremities: Secondary | ICD-10-CM | POA: Diagnosis not present

## 2022-05-18 NOTE — Progress Notes (Signed)
Cardiology Office Note:    Date:  05/18/2022   ID:  Allen [name], DOB 1950/12/11, MRN [id number]  PCP:  Philip Aspen, [name], MD  Cardiologist:  [name], MD  Referring MD: Philip Aspen, Estel*   CC: follow up  History of Present Illness:    Allen [name] is a 72 y.o. male with a hx of shortness of breath, LE edema who is seen for follow up today. I initially met him 05/26/19 as a new consult at the request of Philip Aspen, Almira Bar* for the evaluation and management of shortness of breath, enlarged heart on CT scan.  Cardiovascular risk factors: Tobacco use history: former, quit >30 years ago Family history: father died of myocarditis at age 72, heart was good prior. No other heart issues. Many with lung issues. Mother died of COPD. Father and grandfather had bad asthma.  Prior cardiac testing and/or incidental findings on other testing: had [place] in [place] several years ago, told his vessels were clear. Cath done due to shortness of breath and leg swelling. Noted enlarged heart on recent CT.  Has COPD and a chronic cough. Has OSA, uses CPAP.  Has chronic bilateral LE edema. Has had three prior vein surgeries, the earliest of which was in his 30s.  Today: Has lost 57 lbs since July with nutrisystem. No longer on CPAP since weight loss, cut down on his Trelegy. Still with some shortness of breath with inclines. We reviewed his CT and echo at length today. Very reassuring.   Planned for arthroscopic knee surgery in June. Discussed preop eval today. Rides his bike 30 min every day, at increasing intensity toward the end. Very active at baseline.  Past Medical History:  Diagnosis Date   Anxiety    Arthritis    neck -   BPH with obstruction/lower urinary tract symptoms    urologist-- dr pace   Cancer [facility])    basal cell to nose   Centrilobular emphysema (HCC)    followed by dr Craige Cotta   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followed by dr Craige Cotta   Cough 07/2020   covid  residual, no congestion   DOE (dyspnea on exertion)    due to copd/ emphysema   ED (erectile dysfunction)    Essential tremor    neurologist--- dr tat----  bilateral upper extremities,  s/p DPS 12/ 2016   GERD (gastroesophageal reflux disease)    History of 2019 novel coronavirus disease (COVID-19) 07/18/2020   per pt postive home test 07-18-2020 w/ mild  symptoms per pt; pt stated had urgent care visit 07-24-2020 for sob/ cough with negative cxr (in care everywhere) ;  follow up pcp visit 08-03-2020 in epic   Insomnia    Mixed simple and mucopurulent chronic bronchitis (HCC)    OSA (obstructive sleep apnea)    followed by dr Craige Cotta---- no longer does uses bipap, intolerant --- pt prescriped  Oxygen  2 L oxygen via [place] while sleeping  (study in epic 04/ 2017 AHI 40)   Perennial allergic rhinitis    Pneumonia    Pulmonary nodules    followed by dr Craige Cotta   S/P deep brain stimulator placement 12/2014   followed by neurology-----IPG change 01-21-2020 for end-of life battery  (pt has a control)   Varicose vein of leg    per pt has had 3 procedure's last one 2006   Wears dentures    upper    Past Surgical History:  Procedure Laterality Date   ANKLE  SURGERY Right 2020   tendon repair   APPENDECTOMY     CARPAL TUNNEL RELEASE Left 2019   CATARACT EXTRACTION W/ INTRAOCULAR LENS IMPLANT Bilateral 2020   COLONOSCOPY     CYSTOSCOPY WITH URETHRAL DILATATION N/A 08/29/2021   Procedure: CYSTOSCOPY WITH URETHRAL DILATATION;  Surgeon: Noel Christmas, MD;  Location: [facility];  Service: Urology;  Laterality: N/A;   DEEP BRAIN STIMULATOR PLACEMENT Left 2016   INGUINAL HERNIA REPAIR Right 2007   ROTATOR CUFF REPAIR Right 2014   SUBTHALAMIC STIMULATOR BATTERY REPLACEMENT N/A 01/21/2020   Procedure: Deep brain stimulator battery replacement;  Surgeon: Maeola Harman, MD;  [facility];  Service: Neurosurgery;  Laterality: N/A;   TONSILLECTOMY AND ADENOIDECTOMY     age 64   TOTAL SHOULDER REPLACEMENT  Left 2016   TRANSURETHRAL RESECTION OF PROSTATE N/A 08/09/2020   Procedure: TRANSURETHRAL RESECTION OF THE PROSTATE (TURP), BLADDER BIOPSY AND FULGERATION;  Surgeon: Noel Christmas, MD;  Location: [facility];  Service: Urology;  Laterality: N/A;   TRANSURETHRAL RESECTION OF PROSTATE N/A 08/29/2021   Procedure: RE-TRANSURETHRAL RESECTION OF THE PROSTATE (TURP);  Surgeon: Noel Christmas, MD;  Location: [facility];  Service: Urology;  Laterality: N/A;   VEIN SURGERY Left 2006   varicose veins    Current Medications: Current Outpatient Medications on File Prior to Visit  Medication Sig   Ascorbic Acid (VITAMIN C) 1000 MG tablet Take 1,000 mg by mouth in the morning.   azelastine (ASTELIN) 0.1 % nasal spray Place 1-2 sprays into both nostrils 2 (two) times daily as needed for rhinitis.   Azelastine-Fluticasone 137-50 MCG/ACT SUSP Place 1 spray into the nose in the morning and at bedtime.   azithromycin (ZITHROMAX) 250 MG tablet Take 250 mg by mouth as needed.   buPROPion (WELLBUTRIN XL) 150 MG 24 hr tablet Take 150 mg by mouth every morning.   Cholecalciferol (VITAMIN D3) 125 MCG (5000 UT) TABS Take 5,000 Units by mouth in the morning.   Coenzyme Q10 300 MG CAPS Take 300 mg by mouth in the morning.   escitalopram (LEXAPRO) 20 MG tablet Take 1 tablet (20 mg total) by mouth daily. (Patient taking differently: Take 20 mg by mouth at bedtime.)   fluticasone (FLONASE) 50 MCG/ACT nasal spray Place 2 sprays into both nostrils daily as needed for allergies or rhinitis.   Fluticasone-Umeclidin-Vilant (TRELEGY ELLIPTA) 100-62.5-25 MCG/ACT AEPB Inhale 1 puff into the lungs daily.   montelukast (SINGULAIR) 10 MG tablet TAKE 1 TABLET BY MOUTH AT  BEDTIME   MYRBETRIQ 50 MG TB24 tablet Take 50 mg by mouth in the morning.   Omega-3 Fatty Acids (FISH OIL PO) Take 2,200 mg by mouth in the morning.   omeprazole (PRILOSEC) 40 MG capsule Take 40 mg, 1 capsule, by mouth once to twice daily    predniSONE (DELTASONE) 20 MG tablet Take 20 mg by mouth as needed.   primidone (MYSOLINE) 50 MG tablet Take 100 mg by mouth every evening.   tadalafil (CIALIS) 20 MG tablet Take 1 tablet (20 mg total) by mouth daily as needed for erectile dysfunction. (Patient taking differently: Take 10 mg by mouth daily.)   tamsulosin (FLOMAX) 0.4 MG CAPS capsule Take 0.4 mg by mouth in the morning.   traZODone (DESYREL) 100 MG tablet TAKE 1 TABLET AT BEDTIME   TURMERIC PO Take 2,250 mg by mouth in the morning.   zaleplon (SONATA) 10 MG capsule Take 10 mg by mouth at bedtime.   No current facility-administered  medications on file prior to visit.     Allergies:   Copper-containing compounds   Social History   Tobacco Use   Smoking status: Former    Packs/day: 3.00    Years: 20.00    Additional pack years: 0.00    Total pack years: 60.00    Types: Cigarettes    Quit date: 1991    Years since quitting: 33.3   Smokeless tobacco: Never  Vaping Use   Vaping Use: Never used  Substance Use Topics   Alcohol use: Yes    Alcohol/week: 1.0 standard drink of alcohol    Types: 1 Glasses of wine per week    Comment: occassionally   Drug use: Never    Family History: family history includes Asthma in his father; COPD in his mother; Healthy in his child; Rectal cancer in his sister. There is no history of Urticaria, Immunodeficiency, Eczema, Atopy, Angioedema, or Allergic rhinitis.  ROS:   Please see the history of present illness.  Additional pertinent ROS otherwise unremarkable.    EKGs/Labs/Other Studies Reviewed:    The following studies were reviewed today: Echo 05/01/22  1. Left ventricular ejection fraction, by estimation, is 50 to 55%. The  left ventricle has low normal function. The left ventricle has no regional  wall motion abnormalities. There is mild left ventricular hypertrophy.  Left ventricular diastolic  parameters are consistent with Grade I diastolic dysfunction (impaired   relaxation).   2. Right ventricular systolic function is normal. The right ventricular  size is normal. There is normal pulmonary artery systolic pressure. The  estimated right ventricular systolic pressure is 29.7 mmHg.   3. Left atrial size was mild to moderately dilated.   4. The mitral valve is normal in structure. Trivial mitral valve  regurgitation. No evidence of mitral stenosis.   5. The aortic valve is tricuspid. Aortic valve regurgitation is not  visualized. No aortic stenosis is present.   6. The inferior vena cava is normal in size with <50% respiratory  variability, suggesting right atrial pressure of 8 mmHg.   Echo 05/29/19  1. Left ventricular ejection fraction, by estimation, is 60 to 65%. The  left ventricle has normal function. The left ventricle has no regional  wall motion abnormalities. There is mild concentric left ventricular  hypertrophy. Left ventricular diastolic  parameters are consistent with Grade I diastolic dysfunction (impaired  relaxation).   2. Right ventricular systolic function is normal. The right ventricular  size is normal. Tricuspid regurgitation signal is inadequate for assessing  PA pressure.   3. Left atrial size was moderately dilated.   4. The mitral valve is normal in structure. No evidence of mitral valve  regurgitation. No evidence of mitral stenosis.   5. The aortic valve is normal in structure. Aortic valve regurgitation is  not visualized. No aortic stenosis is present.   6. The inferior vena cava is normal in size with greater than 50%  respiratory variability, suggesting right atrial pressure of 3 mmHg.   CT chest 05/04/19 Cardiovascular: Heart is enlarged.  No pericardial effusion  EKG:  EKG is personally reviewed.  05/18/22: sinus bradycardia at 57 bpm, rsr' 05/29/21: NSR at 64 bpm with RSR'  Recent Labs: 07/20/2021: ALT 20; TSH 1.95 08/22/2021: BUN 15; Creatinine, Ser 1.21; Hemoglobin 13.1; Platelets 277; Potassium 4.8; Sodium  139  Recent Lipid Panel    Component Value Date/Time   CHOL 194 07/20/2021 1010   TRIG 154.0 (H) 07/20/2021 1010   HDL 67.20 07/20/2021  1010   CHOLHDL 3 07/20/2021 1010   VLDL 30.8 07/20/2021 1010   LDLCALC 96 07/20/2021 1010    Physical Exam:    VS:  BP 116/72 (BP Location: Left Arm, Patient Position: Sitting, Cuff Size: Normal)   Pulse (!) 57   Ht 5\' 10"  (1.778 m)   Wt 211 lb 6.4 oz (95.9 kg)   BMI 30.33 kg/m     Wt Readings from Last 3 Encounters:  05/18/22 211 lb 6.4 oz (95.9 kg)  04/18/22 212 lb (96.2 kg)  04/02/22 207 lb (93.9 kg)    GEN: Well nourished, well developed in no acute distress HEENT: Normal, moist mucous membranes NECK: No JVD CARDIAC: regular rhythm, normal S1 and S2, no rubs or gallops. No murmurs. VASCULAR: Radial and DP pulses 2+ bilaterally. No carotid bruits RESPIRATORY:  Clear to auscultation without rales, wheezing or rhonchi  ABDOMEN: Soft, non-tender, non-distended MUSCULOSKELETAL:  Ambulates independently SKIN: Warm and dry, bilateral trace LE edema. +varicose veins NEUROLOGIC:  Alert and oriented x 3. No focal neuro deficits noted. PSYCHIATRIC:  Normal affect    ASSESSMENT:    1. Preop cardiovascular exam   2. Encounter to discuss test results   3. Cardiac risk counseling   4. Counseling on health promotion and disease prevention   5. Varicose veins of both lower extremities, unspecified whether complicated      PLAN:    Preop cardiology evaluation According to the Revised Cardiac Risk Index (RCRI), his Perioperative Risk of Major Cardiac Event is (%): 0.4  His Functional Capacity in METs is: 9.25 according to the Duke Activity Status Index (DASI).  The patient is not currently having active cardiac symptoms, and they can achieve >4 METs of activity.  According to ACC/AHA Guidelines, no further testing is needed.  Proceed with surgery at acceptable risk.  Our service is available as needed in the peri-operative period.      Abnormal CT (trace pericardial effusion and concern for cardiomegaly) Echocardiogram -reviewed results at length, reassuring  DOE -prior cardiac cath clean in 2016 in [place] per report -reviewed echo, reassuring  Lower extremity edema Varicose veins -echo supports that this is not cardiac in etiology -we discussed venous insufficiency at length, recommendations for management -has seen vascular  Cardiac risk counseling and prevention recommendations: -recommend heart healthy/Mediterranean diet, with whole grains, fruits, vegetable, fish, lean meats, nuts, and olive oil. Limit salt. -recommend moderate walking, 3-5 times/week for 30-50 minutes each session. Aim for at least 150 minutes.week. Goal should be pace of 3 miles/hours, or walking 1.5 miles in 30 minutes -recommend avoidance of tobacco products. Avoid excess alcohol. -ASCVD risk score: The 10-year ASCVD risk score (Arnett DK, et al., 2019) is: 17.1%   Values used to calculate the score:     Age: 89 years     Sex: Male     Is Non-Hispanic African American: No     Diabetic: No     Tobacco smoker: Yes     Systolic Blood Pressure: 116 mmHg     Is BP treated: No     HDL Cholesterol: 67.2 mg/dL     Total Cholesterol: 194 mg/dL    Plan for follow up: as needed  [name], MD, PhD [facility]    Signed, [name], MD PhD 05/18/2022    [facility]

## 2022-05-18 NOTE — Patient Instructions (Signed)
Medication Instructions:  Your physician recommends that you continue on your current medications as directed. Please refer to the Current Medication list given to you today.  *If you need a refill on your cardiac medications before your next appointment, please call your pharmacy*  Follow-Up: At [facility], you and your health needs are our priority.  As part of our continuing mission to provide you with exceptional heart care, we have created designated Provider Care Teams.  These Care Teams include your primary Cardiologist (physician) and Advanced Practice Providers (APPs -  Physician Assistants and Nurse Practitioners) who all work together to provide you with the care you need, when you need it.  We recommend signing up for the patient portal called "MyChart".  Sign up information is provided on this After Visit Summary.  MyChart is used to connect with patients for Virtual Visits (Telemedicine).  Patients are able to view lab/test results, encounter notes, upcoming appointments, etc.  Non-urgent messages can be sent to your provider as well.   To learn more about what you can do with MyChart, go to [email or web address].    Your next appointment:   1 year(s)  Provider:   Dr. Cristal Deer

## 2022-05-22 ENCOUNTER — Telehealth (HOSPITAL_BASED_OUTPATIENT_CLINIC_OR_DEPARTMENT_OTHER): Payer: Self-pay | Admitting: Cardiology

## 2022-05-22 NOTE — Telephone Encounter (Signed)
   Pre-operative Risk Assessment    Patient Name: Allen [name]  DOB: 02-14-1950 MRN: [id number]      Request for Surgical Clearance    Procedure:  Left Knee Arthroscopy  Date of Surgery:  Clearance   TBD                                Surgeon:  Dr Jodi Geralds Surgeon's Group or Practice Name:   Phone number:  [phone] Fax number:  [phone]   Type of Clearance Requested:   Medical and Medicine- not sure about the medicine    Type of Anesthesia:  General    Additional requests/questions:    Signed, Laurence Ferrari   05/22/2022, 11:59 AM

## 2022-05-22 NOTE — Telephone Encounter (Signed)
   Name: Allen [name]  DOB: 08-15-1950  MRN: [id number]   Primary Cardiologist: [name], MD  Chart reviewed as part of pre-operative protocol coverage. [name] was last seen on 05/18/2022 by Dr. Cristal Deer.  Per office visit note:   "Preop cardiology evaluation According to the Revised Cardiac Risk Index (RCRI), his Perioperative Risk of Major Cardiac Event is (%): 0.4   His Functional Capacity in METs is: 9.25 according to the Duke Activity Status Index (DASI).   The patient is not currently having active cardiac symptoms, and they can achieve >4 METs of activity.   According to ACC/AHA Guidelines, no further testing is needed.  Proceed with surgery at acceptable risk.  Our service is available as needed in the peri-operative period."   I will route this recommendation to the requesting party via Epic fax function and remove from pre-op pool. Please call with questions.  Carlos Levering, NP 05/22/2022, 3:55 PM

## 2022-05-23 ENCOUNTER — Telehealth: Payer: Self-pay | Admitting: Nurse Practitioner

## 2022-05-23 ENCOUNTER — Encounter: Payer: Self-pay | Admitting: Gastroenterology

## 2022-05-23 ENCOUNTER — Encounter: Payer: Self-pay | Admitting: Internal Medicine

## 2022-05-23 NOTE — Telephone Encounter (Signed)
If unchanged from our last visit and breathing stable, ok to provide with clearance. Please verify with patient beforehand.

## 2022-05-23 NOTE — Telephone Encounter (Signed)
Fax received from Dr. Milly Jakob with [facility] to perform a Left Knee Arthroscopy on patient.  Patient needs surgery clearance. Surgery is Pending. Patient was seen on 04/02/22. Office protocol is a risk assessment can be sent to surgeon if patient has been seen in 60 days or less.   Sending to [employer] for risk assessment or recommendations if patient needs to be seen in office prior to surgical procedure.    If he needs a more current OV please advise

## 2022-05-23 NOTE — Telephone Encounter (Signed)
Pt called to FU on this inquiry. CMA was with a Pt.  Pt is asking for a call back to discuss.

## 2022-05-23 NOTE — Telephone Encounter (Signed)
Spoke with patient. States breathing is excellent. Feeling much better since losing weight.

## 2022-05-23 NOTE — Progress Notes (Signed)
05/23/2022 Addendum for preoperative evaluation:  Factors that increase the risk for postoperative pulmonary complications are COPD with asthma, CHF, age, GERD  Respiratory complications generally occur in 1% of ASA Class I patients, 5% of ASA Class II and 10% of ASA Class III-IV patients These complications rarely result in mortality and include postoperative pneumonia, atelectasis, pulmonary embolism, ARDS and increased time requiring postoperative mechanical ventilation.   Overall, I recommend proceeding with the surgery if the risk for respiratory complications are outweighed by the potential benefits. This will need to be discussed between the patient and surgeon.   To reduce risks of respiratory complications, I recommend: --Pre- and post-operative incentive spirometry performed frequently while awake --Inpatient use of currently bronchodilators  --Short duration of surgery as much as possible and avoid paralytic if possible --OOB, encourage mobility post-op, DVT prophylaxis if indicated    1) RISK FOR PROLONGED MECHANICAL VENTILAION - > 48h  1A) Arozullah - Prolonged mech ventilation risk Arozullah Postperative Pulmonary Risk Score - for mech ventilation dependence >48h USAA, Ann Surg 2000, major non-cardiac surgery) Comment Score  Type of surgery - abd ao aneurysm (27), thoracic (21), neurosurgery / upper abdominal / vascular (21), neck (11) Left knee arthroscopy  5  Emergency Surgery - (11)  0  ALbumin < 3 or poor nutritional state - (9)  0  BUN > 30 -  (8)  0  Partial or completely dependent functional status - (7)  0  COPD -  (6) COPD/asthma 6  Age - 60 to 69 (4), > 70  (6) 71 6  TOTAL  17  Risk Stratifcation scores  - < 10 (0.5%), 11-19 (1.8%), 20-27 (4.2%), 28-40 (10.1%), >40 (26.6%)  1.8%

## 2022-05-23 NOTE — Telephone Encounter (Signed)
Will addend note to add. Thanks.

## 2022-05-24 NOTE — Telephone Encounter (Signed)
OV notes and clearance form have been faxed back to [facility]. Nothing further needed at this time.  

## 2022-05-25 ENCOUNTER — Telehealth (HOSPITAL_BASED_OUTPATIENT_CLINIC_OR_DEPARTMENT_OTHER): Payer: Self-pay | Admitting: Nurse Practitioner

## 2022-05-25 NOTE — Telephone Encounter (Signed)
Darel Hong from [facility] called stating they have not received surgical clearance fax from [place]. She was informed as of 05/24/22 8AM it had been faxed to their office. Please advise.   F: [phone] Alejandro Mulling

## 2022-05-29 DIAGNOSIS — Z79899 Other long term (current) drug therapy: Secondary | ICD-10-CM | POA: Diagnosis not present

## 2022-05-29 DIAGNOSIS — M47812 Spondylosis without myelopathy or radiculopathy, cervical region: Secondary | ICD-10-CM | POA: Insufficient documentation

## 2022-05-29 DIAGNOSIS — J449 Chronic obstructive pulmonary disease, unspecified: Secondary | ICD-10-CM | POA: Diagnosis not present

## 2022-05-29 DIAGNOSIS — R259 Unspecified abnormal involuntary movements: Secondary | ICD-10-CM | POA: Diagnosis not present

## 2022-05-29 DIAGNOSIS — Z9682 Presence of neurostimulator: Secondary | ICD-10-CM | POA: Diagnosis not present

## 2022-05-29 DIAGNOSIS — G250 Essential tremor: Secondary | ICD-10-CM | POA: Diagnosis not present

## 2022-05-29 DIAGNOSIS — G4733 Obstructive sleep apnea (adult) (pediatric): Secondary | ICD-10-CM | POA: Insufficient documentation

## 2022-05-29 DIAGNOSIS — Z4542 Encounter for adjustment and management of neuropacemaker (brain) (peripheral nerve) (spinal cord): Secondary | ICD-10-CM | POA: Diagnosis not present

## 2022-05-31 ENCOUNTER — Telehealth: Payer: Self-pay | Admitting: Student

## 2022-05-31 NOTE — Telephone Encounter (Signed)
Please see last signed encounter. [facility] has not rec'd fax for SX clearance on June 10th for this PT so Ortho has PT calling us. Please refax. The number I found on line for Fax was : [phone]  Please resend. TY.

## 2022-05-31 NOTE — Telephone Encounter (Signed)
Allen [name] calling again stating they have not received fax that we sent. Patient is getting anxious since this is the last clearance needed. Please advise and call Allen [name] back if needed at [phone].

## 2022-06-01 NOTE — Telephone Encounter (Signed)
Clearance has been re-sent again. Will keep encounter open for now

## 2022-06-01 NOTE — Telephone Encounter (Signed)
Spoke with Allen [name] at [employer]. She has gave me another fax number to use. I have re-sent clearance

## 2022-06-01 NOTE — Telephone Encounter (Signed)
Closing encounter since multiples are open. Surgical clearance has been re-faxed this morning

## 2022-06-04 ENCOUNTER — Telehealth: Payer: Self-pay | Admitting: *Deleted

## 2022-06-04 NOTE — Telephone Encounter (Signed)
Attempt to reach pt for rescheduling pre-visit. LM with call back #

## 2022-06-05 NOTE — Telephone Encounter (Signed)
Clearance has been received. Closing encounter. NFN

## 2022-06-05 NOTE — Telephone Encounter (Signed)
Patient's pre-visit is rescheduled.

## 2022-06-24 DIAGNOSIS — H1033 Unspecified acute conjunctivitis, bilateral: Secondary | ICD-10-CM | POA: Diagnosis not present

## 2022-06-25 ENCOUNTER — Ambulatory Visit: Payer: Medicare Other | Admitting: Podiatry

## 2022-06-25 DIAGNOSIS — S83232A Complex tear of medial meniscus, current injury, left knee, initial encounter: Secondary | ICD-10-CM | POA: Diagnosis not present

## 2022-06-25 DIAGNOSIS — G8918 Other acute postprocedural pain: Secondary | ICD-10-CM | POA: Diagnosis not present

## 2022-06-25 DIAGNOSIS — M6752 Plica syndrome, left knee: Secondary | ICD-10-CM | POA: Diagnosis not present

## 2022-06-25 DIAGNOSIS — M94262 Chondromalacia, left knee: Secondary | ICD-10-CM | POA: Diagnosis not present

## 2022-06-25 HISTORY — PX: KNEE ARTHROSCOPY W/ MENISCECTOMY: SHX1879

## 2022-06-28 ENCOUNTER — Telehealth: Payer: Medicare Other | Admitting: Family Medicine

## 2022-06-28 DIAGNOSIS — J208 Acute bronchitis due to other specified organisms: Secondary | ICD-10-CM | POA: Diagnosis not present

## 2022-06-28 DIAGNOSIS — B9689 Other specified bacterial agents as the cause of diseases classified elsewhere: Secondary | ICD-10-CM | POA: Diagnosis not present

## 2022-06-28 MED ORDER — DOXYCYCLINE HYCLATE 100 MG PO TABS
100.0000 mg | ORAL_TABLET | Freq: Two times a day (BID) | ORAL | 0 refills | Status: AC
Start: 2022-06-28 — End: 2022-07-08

## 2022-06-28 NOTE — Patient Instructions (Addendum)
Allen [name], thank you for joining Freddy Finner, NP for today's virtual visit.  While this provider is not your primary care provider (PCP), if your PCP is located in our provider database this encounter information will be shared with them immediately following your visit.   A [facility] MyChart account gives you access to today's visit and all your visits, tests, and labs performed at [facility] " click here if you don't have a [place] MyChart account or go to [email or web address]  Consent: (Patient) Allen [name] provided verbal consent for this virtual visit at the beginning of the encounter.  Current Medications:  Current Outpatient Medications:    doxycycline (VIBRA-TABS) 100 MG tablet, Take 1 tablet (100 mg total) by mouth 2 (two) times daily for 10 days., Disp: 20 tablet, Rfl: 0   Ascorbic Acid (VITAMIN C) 1000 MG tablet, Take 1,000 mg by mouth in the morning., Disp: , Rfl:    azelastine (ASTELIN) 0.1 % nasal spray, Place 1-2 sprays into both nostrils 2 (two) times daily as needed for rhinitis., Disp: 30 mL, Rfl: 5   Azelastine-Fluticasone 137-50 MCG/ACT SUSP, Place 1 spray into the nose in the morning and at bedtime., Disp: 23 g, Rfl: 5   azithromycin (ZITHROMAX) 250 MG tablet, Take 250 mg by mouth as needed., Disp: , Rfl:    buPROPion (WELLBUTRIN XL) 150 MG 24 hr tablet, Take 150 mg by mouth every morning., Disp: , Rfl:    Cholecalciferol (VITAMIN D3) 125 MCG (5000 UT) TABS, Take 5,000 Units by mouth in the morning., Disp: , Rfl:    Coenzyme Q10 300 MG CAPS, Take 300 mg by mouth in the morning., Disp: , Rfl:    escitalopram (LEXAPRO) 20 MG tablet, Take 1 tablet (20 mg total) by mouth daily. (Patient taking differently: Take 20 mg by mouth at bedtime.), Disp: 90 tablet, Rfl: 1   fluticasone (FLONASE) 50 MCG/ACT nasal spray, Place 2 sprays into both nostrils daily as needed for allergies or rhinitis., Disp: 16 g, Rfl: 5   Fluticasone-Umeclidin-Vilant  (TRELEGY ELLIPTA) 100-62.5-25 MCG/ACT AEPB, Inhale 1 puff into the lungs daily., Disp: 180 each, Rfl: 3   montelukast (SINGULAIR) 10 MG tablet, TAKE 1 TABLET BY MOUTH AT  BEDTIME, Disp: 100 tablet, Rfl: 0   MYRBETRIQ 50 MG TB24 tablet, Take 50 mg by mouth in the morning., Disp: , Rfl:    Omega-3 Fatty Acids (FISH OIL PO), Take 2,200 mg by mouth in the morning., Disp: , Rfl:    omeprazole (PRILOSEC) 40 MG capsule, Take 40 mg, 1 capsule, by mouth once to twice daily, Disp: 180 capsule, Rfl: 3   predniSONE (DELTASONE) 20 MG tablet, Take 20 mg by mouth as needed., Disp: , Rfl:    primidone (MYSOLINE) 50 MG tablet, Take 100 mg by mouth every evening., Disp: , Rfl:    tadalafil (CIALIS) 20 MG tablet, Take 1 tablet (20 mg total) by mouth daily as needed for erectile dysfunction. (Patient taking differently: Take 10 mg by mouth daily.), Disp: 90 tablet, Rfl: 3   tamsulosin (FLOMAX) 0.4 MG CAPS capsule, Take 0.4 mg by mouth in the morning., Disp: , Rfl:    traZODone (DESYREL) 100 MG tablet, TAKE 1 TABLET AT BEDTIME, Disp: 90 tablet, Rfl: 1   TURMERIC PO, Take 2,250 mg by mouth in the morning., Disp: , Rfl:    zaleplon (SONATA) 10 MG capsule, Take 10 mg by mouth at bedtime., Disp: , Rfl:    Medications ordered in this encounter:  Meds ordered this encounter  Medications   doxycycline (VIBRA-TABS) 100 MG tablet    Sig: Take 1 tablet (100 mg total) by mouth 2 (two) times daily for 10 days.    Dispense:  20 tablet    Refill:  0    Order Specific Question:   Supervising Provider    Answer:   Merrilee Jansky [id number]     *If you need refills on other medications prior to your next appointment, please contact your pharmacy*  Follow-Up: Call back or seek an in-person evaluation if the symptoms worsen or if the condition fails to improve as anticipated.  [facility] [phone]  Other Instructions   - Take meds as prescribed - Rest voice - Use a cool mist humidifier especially  during the winter months when heat dries out the air. - Use saline nose sprays frequently to help soothe nasal passages if they are drying out. - Stay hydrated by drinking plenty of fluids - Keep thermostat turn down low to prevent drying out which can cause a dry cough. - For any cough or congestion- robitussin DM or Delsym as needed   If you do not improve you will need a follow up visit in person. If you have been instructed to have an in-person evaluation today at a local Urgent Care facility, please use the link below. It will take you to a list of all of our available [place] [facility], including address, phone number and hours of operation. Please do not delay care.  [facility]  If you or a family member do not have a primary care provider, use the link below to schedule a visit and establish care. When you choose a [facility] primary care physician or advanced practice provider, you gain a long-term partner in health. Find a Primary Care Provider  Learn more about [facility]'s in-office and virtual care options:  - Get Care Now

## 2022-06-28 NOTE — Progress Notes (Signed)
Virtual Visit Consent   Allen [name], you are scheduled for a virtual visit with a [facility] provider today. Just as with appointments in the office, your consent must be obtained to participate. Your consent will be active for this visit and any virtual visit you may have with one of our providers in the next 365 days. If you have a MyChart account, a copy of this consent can be sent to you electronically.  As this is a virtual visit, video technology does not allow for your provider to perform a traditional examination. This may limit your provider's ability to fully assess your condition. If your provider identifies any concerns that need to be evaluated in person or the need to arrange testing (such as labs, EKG, etc.), we will make arrangements to do so. Although advances in technology are sophisticated, we cannot ensure that it will always work on either your end or our end. If the connection with a video visit is poor, the visit may have to be switched to a telephone visit. With either a video or telephone visit, we are not always able to ensure that we have a secure connection.  By engaging in this virtual visit, you consent to the provision of healthcare and authorize for your insurance to be billed (if applicable) for the services provided during this visit. Depending on your insurance coverage, you may receive a charge related to this service.  I need to obtain your verbal consent now. Are you willing to proceed with your visit today? Allen [name] has provided verbal consent on 06/28/2022 for a virtual visit (video or telephone). Freddy Finner, NP  Date: 06/28/2022 2:31 PM  Virtual Visit via Video Note   I, Freddy Finner, connected with  Allen [name]  ([id number], [birth year]) on 06/28/22 at  2:30 PM EDT by a video-enabled telemedicine application and verified that I am speaking with the correct person using two identifiers.  Location: Patient: Virtual Visit Location Patient:  Home Provider: Virtual Visit Location Provider: Home Office   I discussed the limitations of evaluation and management by telemedicine and the availability of in person appointments. The patient expressed understanding and agreed to proceed.    History of Present Illness: Allen [name] is a 72 y.o. who identifies as a male who was assigned male at birth, and is being seen today for worsening congestion  Onset was congestion started on Sunday- 5 days ago- eyes crusting and drainage- and ear inflamed, chest was clear at that time at [facility]- give drops for eyes that improved. Had knee on Monday and not coughing slowly progressed and now is coughing up mucus that is dark green. Modifying factors are nothing at this time, just the eye drops Denies chest pain, shortness of breath, fevers, chills, wheezing Unknown sick contacts Known history of COPD with increased mucus production over this week  Problems:  Patient Active Problem List   Diagnosis Date Noted   Effusion of left knee 04/18/2022   Degenerative arthritis of left knee 03/28/2022   MCI (mild cognitive impairment) with memory loss 09/29/2021   Chronic respiratory failure with hypoxia (HCC) 09/01/2020   BPH with obstruction/lower urinary tract symptoms 08/09/2020   S/P deep brain stimulator placement 03/25/2020   Tremor 03/25/2020   Pulmonary nodule less than 6 mm in diameter with high risk for malignant neoplasm 03/09/2020   Injury of axilla 03/03/2020   Body mass index (BMI) 32.0-32.9, adult 02/22/2020   Cervical radiculopathy 02/22/2020   Elevated blood-pressure reading, without  diagnosis of hypertension 02/22/2020   Neck pain 02/22/2020   Greater trochanteric bursitis of right hip 11/18/2019   Degenerative disc disease, cervical 07/01/2019   Degenerative disc disease, lumbar 07/01/2019   Nonallopathic lesion of lumbar region 07/01/2019   Varicose veins of both lower extremities 06/23/2019   Chronic diastolic CHF (congestive  heart failure) (HCC) 06/02/2019   Perennial allergic rhinitis probable nonallergic component 06/01/2019   Cough, persistent 06/01/2019   Vitamin D deficiency 05/07/2019   Moderate persistent asthma without complication 04/10/2019   Allergic rhinitis 04/10/2019   Shortness of breath 04/10/2019   GERD (gastroesophageal reflux disease)    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Mild obstructive sleep apnea    Right carpal tunnel syndrome    Essential tremor    Insomnia    ED (erectile dysfunction)    Ulcerative colitis (HCC) 06/30/2014    Allergies:  Allergies  Allergen Reactions   Copper-Containing Compounds     Copper gloves caused redness and blisters bilateral hands   Medications:  Current Outpatient Medications:    Ascorbic Acid (VITAMIN C) 1000 MG tablet, Take 1,000 mg by mouth in the morning., Disp: , Rfl:    azelastine (ASTELIN) 0.1 % nasal spray, Place 1-2 sprays into both nostrils 2 (two) times daily as needed for rhinitis., Disp: 30 mL, Rfl: 5   Azelastine-Fluticasone 137-50 MCG/ACT SUSP, Place 1 spray into the nose in the morning and at bedtime., Disp: 23 g, Rfl: 5   azithromycin (ZITHROMAX) 250 MG tablet, Take 250 mg by mouth as needed., Disp: , Rfl:    buPROPion (WELLBUTRIN XL) 150 MG 24 hr tablet, Take 150 mg by mouth every morning., Disp: , Rfl:    Cholecalciferol (VITAMIN D3) 125 MCG (5000 UT) TABS, Take 5,000 Units by mouth in the morning., Disp: , Rfl:    Coenzyme Q10 300 MG CAPS, Take 300 mg by mouth in the morning., Disp: , Rfl:    escitalopram (LEXAPRO) 20 MG tablet, Take 1 tablet (20 mg total) by mouth daily. (Patient taking differently: Take 20 mg by mouth at bedtime.), Disp: 90 tablet, Rfl: 1   fluticasone (FLONASE) 50 MCG/ACT nasal spray, Place 2 sprays into both nostrils daily as needed for allergies or rhinitis., Disp: 16 g, Rfl: 5   Fluticasone-Umeclidin-Vilant (TRELEGY ELLIPTA) 100-62.5-25 MCG/ACT AEPB, Inhale 1 puff into the lungs daily., Disp: 180 each, Rfl: 3   montelukast  (SINGULAIR) 10 MG tablet, TAKE 1 TABLET BY MOUTH AT  BEDTIME, Disp: 100 tablet, Rfl: 0   MYRBETRIQ 50 MG TB24 tablet, Take 50 mg by mouth in the morning., Disp: , Rfl:    Omega-3 Fatty Acids (FISH OIL PO), Take 2,200 mg by mouth in the morning., Disp: , Rfl:    omeprazole (PRILOSEC) 40 MG capsule, Take 40 mg, 1 capsule, by mouth once to twice daily, Disp: 180 capsule, Rfl: 3   predniSONE (DELTASONE) 20 MG tablet, Take 20 mg by mouth as needed., Disp: , Rfl:    primidone (MYSOLINE) 50 MG tablet, Take 100 mg by mouth every evening., Disp: , Rfl:    tadalafil (CIALIS) 20 MG tablet, Take 1 tablet (20 mg total) by mouth daily as needed for erectile dysfunction. (Patient taking differently: Take 10 mg by mouth daily.), Disp: 90 tablet, Rfl: 3   tamsulosin (FLOMAX) 0.4 MG CAPS capsule, Take 0.4 mg by mouth in the morning., Disp: , Rfl:    traZODone (DESYREL) 100 MG tablet, TAKE 1 TABLET AT BEDTIME, Disp: 90 tablet, Rfl: 1  TURMERIC PO, Take 2,250 mg by mouth in the morning., Disp: , Rfl:    zaleplon (SONATA) 10 MG capsule, Take 10 mg by mouth at bedtime., Disp: , Rfl:   Observations/Objective: Patient is well-developed, well-nourished in no acute distress.  Resting comfortably  at home.  Head is normocephalic, atraumatic.  No labored breathing.  Speech is clear and coherent with logical content.  Patient is alert and oriented at baseline.    Assessment and Plan:   1. Acute bacterial bronchitis  - doxycycline (VIBRA-TABS) 100 MG tablet; Take 1 tablet (100 mg total) by mouth 2 (two) times daily for 10 days.  Dispense: 20 tablet; Refill: 0  - Take meds as prescribed - Rest voice - Use a cool mist humidifier especially during the winter months when heat dries out the air. - Use saline nose sprays frequently to help soothe nasal passages if they are drying out. - Stay hydrated by drinking plenty of fluids - Keep thermostat turn down low to prevent drying out which can cause a dry cough. - For  any cough or congestion- robitussin DM or Delsym as needed   If you do not improve you will need a follow up visit in person.               Reviewed side effects, risks and benefits of medication.    Patient acknowledged agreement and understanding of the plan.   Past Medical, Surgical, Social History, Allergies, and Medications have been Reviewed.    Follow Up Instructions: I discussed the assessment and treatment plan with the patient. The patient was provided an opportunity to ask questions and all were answered. The patient agreed with the plan and demonstrated an understanding of the instructions.  A copy of instructions were sent to the patient via MyChart unless otherwise noted below.    The patient was advised to call back or seek an in-person evaluation if the symptoms worsen or if the condition fails to improve as anticipated.  Time:  I spent 10 minutes with the patient via telehealth technology discussing the above problems/concerns.    Freddy Finner, NP

## 2022-07-04 ENCOUNTER — Encounter: Payer: Medicare HMO | Admitting: Psychology

## 2022-07-05 DIAGNOSIS — M25662 Stiffness of left knee, not elsewhere classified: Secondary | ICD-10-CM | POA: Diagnosis not present

## 2022-07-05 DIAGNOSIS — M94262 Chondromalacia, left knee: Secondary | ICD-10-CM | POA: Diagnosis not present

## 2022-07-05 DIAGNOSIS — M25562 Pain in left knee: Secondary | ICD-10-CM | POA: Diagnosis not present

## 2022-07-06 ENCOUNTER — Encounter (HOSPITAL_BASED_OUTPATIENT_CLINIC_OR_DEPARTMENT_OTHER): Payer: Self-pay | Admitting: Cardiology

## 2022-07-07 ENCOUNTER — Telehealth: Payer: Medicare Other | Admitting: Nurse Practitioner

## 2022-07-07 DIAGNOSIS — J302 Other seasonal allergic rhinitis: Secondary | ICD-10-CM

## 2022-07-07 DIAGNOSIS — H6690 Otitis media, unspecified, unspecified ear: Secondary | ICD-10-CM

## 2022-07-07 MED ORDER — AZELASTINE-FLUTICASONE 137-50 MCG/ACT NA SUSP
1.0000 | Freq: Two times a day (BID) | NASAL | 5 refills | Status: DC
Start: 2022-07-07 — End: 2022-10-30

## 2022-07-07 MED ORDER — CIPROFLOXACIN-DEXAMETHASONE 0.3-0.1 % OT SUSP
4.00 [drp] | Freq: Two times a day (BID) | OTIC | 0 refills | Status: DC
Start: 2022-07-07 — End: 2022-07-24

## 2022-07-07 NOTE — Patient Instructions (Signed)
Allen [name], thank you for joining Claiborne Rigg, NP for today's virtual visit.  While this provider is not your primary care provider (PCP), if your PCP is located in our provider database this encounter information will be shared with them immediately following your visit.   A [place] MyChart account gives you access to today's visit and all your visits, tests, and labs performed at [facility] " click here if you don't have a [facility] MyChart account or go to [email or web address]  Consent: (Patient) Allen [name] provided verbal consent for this virtual visit at the beginning of the encounter.  Current Medications:  Current Outpatient Medications:    ciprofloxacin-dexamethasone (CIPRODEX) OTIC suspension, Place 4 drops into the left ear 2 (two) times daily. For 7 days, Disp: 7.5 mL, Rfl: 0   Ascorbic Acid (VITAMIN C) 1000 MG tablet, Take 1,000 mg by mouth in the morning., Disp: , Rfl:    azelastine (ASTELIN) 0.1 % nasal spray, Place 1-2 sprays into both nostrils 2 (two) times daily as needed for rhinitis., Disp: 30 mL, Rfl: 5   Azelastine-Fluticasone 137-50 MCG/ACT SUSP, Place 1 spray into the nose in the morning and at bedtime., Disp: 23 g, Rfl: 5   azithromycin (ZITHROMAX) 250 MG tablet, Take 250 mg by mouth as needed., Disp: , Rfl:    buPROPion (WELLBUTRIN XL) 150 MG 24 hr tablet, Take 150 mg by mouth every morning., Disp: , Rfl:    Cholecalciferol (VITAMIN D3) 125 MCG (5000 UT) TABS, Take 5,000 Units by mouth in the morning., Disp: , Rfl:    Coenzyme Q10 300 MG CAPS, Take 300 mg by mouth in the morning., Disp: , Rfl:    doxycycline (VIBRA-TABS) 100 MG tablet, Take 1 tablet (100 mg total) by mouth 2 (two) times daily for 10 days., Disp: 20 tablet, Rfl: 0   escitalopram (LEXAPRO) 20 MG tablet, Take 1 tablet (20 mg total) by mouth daily. (Patient taking differently: Take 20 mg by mouth at bedtime.), Disp: 90 tablet, Rfl: 1   fluticasone (FLONASE) 50 MCG/ACT  nasal spray, Place 2 sprays into both nostrils daily as needed for allergies or rhinitis., Disp: 16 g, Rfl: 5   Fluticasone-Umeclidin-Vilant (TRELEGY ELLIPTA) 100-62.5-25 MCG/ACT AEPB, Inhale 1 puff into the lungs daily., Disp: 180 each, Rfl: 3   montelukast (SINGULAIR) 10 MG tablet, TAKE 1 TABLET BY MOUTH AT  BEDTIME, Disp: 100 tablet, Rfl: 0   MYRBETRIQ 50 MG TB24 tablet, Take 50 mg by mouth in the morning., Disp: , Rfl:    Omega-3 Fatty Acids (FISH OIL PO), Take 2,200 mg by mouth in the morning., Disp: , Rfl:    omeprazole (PRILOSEC) 40 MG capsule, Take 40 mg, 1 capsule, by mouth once to twice daily, Disp: 180 capsule, Rfl: 3   predniSONE (DELTASONE) 20 MG tablet, Take 20 mg by mouth as needed., Disp: , Rfl:    primidone (MYSOLINE) 50 MG tablet, Take 100 mg by mouth every evening., Disp: , Rfl:    tadalafil (CIALIS) 20 MG tablet, Take 1 tablet (20 mg total) by mouth daily as needed for erectile dysfunction. (Patient taking differently: Take 10 mg by mouth daily.), Disp: 90 tablet, Rfl: 3   tamsulosin (FLOMAX) 0.4 MG CAPS capsule, Take 0.4 mg by mouth in the morning., Disp: , Rfl:    traZODone (DESYREL) 100 MG tablet, TAKE 1 TABLET AT BEDTIME, Disp: 90 tablet, Rfl: 1   TURMERIC PO, Take 2,250 mg by mouth in the morning., Disp: , Rfl:  zaleplon (SONATA) 10 MG capsule, Take 10 mg by mouth at bedtime., Disp: , Rfl:    Medications ordered in this encounter:  Meds ordered this encounter  Medications   ciprofloxacin-dexamethasone (CIPRODEX) OTIC suspension    Sig: Place 4 drops into the left ear 2 (two) times daily. For 7 days    Dispense:  7.5 mL    Refill:  0    Order Specific Question:   Supervising Provider    Answer:   Merrilee Jansky [id number]   Azelastine-Fluticasone 137-50 MCG/ACT SUSP    Sig: Place 1 spray into the nose in the morning and at bedtime.    Dispense:  23 g    Refill:  5    Order Specific Question:   Supervising Provider    Answer:   Merrilee Jansky [id number]      *If you need refills on other medications prior to your next appointment, please contact your pharmacy*  Follow-Up: Call back or seek an in-person evaluation if the symptoms worsen or if the condition fails to improve as anticipated.  [facility] [phone]    If you have been instructed to have an in-person evaluation today at a local Urgent Care facility, please use the link below. It will take you to a list of all of our available [facility] [facility], including address, phone number and hours of operation. Please do not delay care.  [facility]  If you or a family member do not have a primary care provider, use the link below to schedule a visit and establish care. When you choose a [facility] primary care physician or advanced practice provider, you gain a long-term partner in health. Find a Primary Care Provider  Learn more about [facility]'s in-office and virtual care options: [place] - Get Care Now

## 2022-07-07 NOTE — Progress Notes (Signed)
Virtual Visit Consent   Allen [name], you are scheduled for a virtual visit with a [facility] provider today. Just as with appointments in the office, your consent must be obtained to participate. Your consent will be active for this visit and any virtual visit you may have with one of our providers in the next 365 days. If you have a MyChart account, a copy of this consent can be sent to you electronically.  As this is a virtual visit, video technology does not allow for your provider to perform a traditional examination. This may limit your provider's ability to fully assess your condition. If your provider identifies any concerns that need to be evaluated in person or the need to arrange testing (such as labs, EKG, etc.), we will make arrangements to do so. Although advances in technology are sophisticated, we cannot ensure that it will always work on either your end or our end. If the connection with a video visit is poor, the visit may have to be switched to a telephone visit. With either a video or telephone visit, we are not always able to ensure that we have a secure connection.  By engaging in this virtual visit, you consent to the provision of healthcare and authorize for your insurance to be billed (if applicable) for the services provided during this visit. Depending on your insurance coverage, you may receive a charge related to this service.  I need to obtain your verbal consent now. Are you willing to proceed with your visit today? Puneet Masoner has provided verbal consent on 07/07/2022 for a virtual visit (video or telephone). Claiborne Rigg, NP  Date: 07/07/2022 11:36 AM  Virtual Visit via Video Note   I, Claiborne Rigg, connected with  Terrill Alperin  ([id number], 1950-10-14) on 07/07/22 at 11:30 AM EDT by a video-enabled telemedicine application and verified that I am speaking with the correct person using two identifiers.  Location: Patient: Virtual Visit Location Patient:  Home Provider: Virtual Visit Location Provider: Home Office   I discussed the limitations of evaluation and management by telemedicine and the availability of in person appointments. The patient expressed understanding and agreed to proceed.    History of Present Illness: Allen [name] is a 72 y.o. who identifies as a male who was assigned male at birth, and is being seen today for otitis media/eustachian tube dysfunction.  Mr. Chisolm states he was recently treated for bronchitis with doxycycline and although his chest congestion has resolved he continues to experience significant left ear pain and pressure. States he was told he had an ear infection on 06-24-2022 but was only treated for conjunctivitis at that time. Ear Pain has worsened in intensity since then  Problems:  Patient Active Problem List   Diagnosis Date Noted   Cervical spondylosis 05/29/2022   Mild obstructive sleep apnea 05/29/2022   Effusion of left knee 04/18/2022   Degenerative arthritis of left knee 03/28/2022   Hoarseness 01/18/2022   Laryngopharyngeal reflux (LPR) 01/18/2022   MCI (mild cognitive impairment) with memory loss 09/29/2021   Chronic respiratory failure with hypoxia (HCC) 09/01/2020   BPH with obstruction/lower urinary tract symptoms 08/09/2020   S/P deep brain stimulator placement 03/25/2020   Tremor 03/25/2020   Pulmonary nodule less than 6 mm in diameter with high risk for malignant neoplasm 03/09/2020   Injury of axilla 03/03/2020   Body mass index (BMI) 32.0-32.9, adult 02/22/2020   Cervical radiculopathy 02/22/2020   Elevated blood-pressure reading, without diagnosis of hypertension 02/22/2020  Neck pain 02/22/2020   Greater trochanteric bursitis of right hip 11/18/2019   Degenerative disc disease, cervical 07/01/2019   Degenerative disc disease, lumbar 07/01/2019   Nonallopathic lesion of lumbar region 07/01/2019   Varicose veins of both lower extremities 06/23/2019   Chronic diastolic  CHF (congestive heart failure) (HCC) 06/02/2019   Perennial allergic rhinitis probable nonallergic component 06/01/2019   Cough, persistent 06/01/2019   Vitamin D deficiency 05/07/2019   Moderate persistent asthma without complication 04/10/2019   Allergic rhinitis 04/10/2019   Shortness of breath 04/10/2019   GERD (gastroesophageal reflux disease)    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Right carpal tunnel syndrome    Essential tremor    Insomnia    ED (erectile dysfunction)    Ulcerative colitis (HCC) 06/30/2014    Allergies:  Allergies  Allergen Reactions   Copper-Containing Compounds     Copper gloves caused redness and blisters bilateral hands   Medications:  Current Outpatient Medications:    ciprofloxacin-dexamethasone (CIPRODEX) OTIC suspension, Place 4 drops into the left ear 2 (two) times daily. For 7 days, Disp: 7.5 mL, Rfl: 0   Ascorbic Acid (VITAMIN C) 1000 MG tablet, Take 1,000 mg by mouth in the morning., Disp: , Rfl:    azelastine (ASTELIN) 0.1 % nasal spray, Place 1-2 sprays into both nostrils 2 (two) times daily as needed for rhinitis., Disp: 30 mL, Rfl: 5   Azelastine-Fluticasone 137-50 MCG/ACT SUSP, Place 1 spray into the nose in the morning and at bedtime., Disp: 23 g, Rfl: 5   azithromycin (ZITHROMAX) 250 MG tablet, Take 250 mg by mouth as needed., Disp: , Rfl:    buPROPion (WELLBUTRIN XL) 150 MG 24 hr tablet, Take 150 mg by mouth every morning., Disp: , Rfl:    Cholecalciferol (VITAMIN D3) 125 MCG (5000 UT) TABS, Take 5,000 Units by mouth in the morning., Disp: , Rfl:    Coenzyme Q10 300 MG CAPS, Take 300 mg by mouth in the morning., Disp: , Rfl:    doxycycline (VIBRA-TABS) 100 MG tablet, Take 1 tablet (100 mg total) by mouth 2 (two) times daily for 10 days., Disp: 20 tablet, Rfl: 0   escitalopram (LEXAPRO) 20 MG tablet, Take 1 tablet (20 mg total) by mouth daily. (Patient taking differently: Take 20 mg by mouth at bedtime.), Disp: 90 tablet, Rfl: 1   fluticasone (FLONASE) 50  MCG/ACT nasal spray, Place 2 sprays into both nostrils daily as needed for allergies or rhinitis., Disp: 16 g, Rfl: 5   Fluticasone-Umeclidin-Vilant (TRELEGY ELLIPTA) 100-62.5-25 MCG/ACT AEPB, Inhale 1 puff into the lungs daily., Disp: 180 each, Rfl: 3   montelukast (SINGULAIR) 10 MG tablet, TAKE 1 TABLET BY MOUTH AT  BEDTIME, Disp: 100 tablet, Rfl: 0   MYRBETRIQ 50 MG TB24 tablet, Take 50 mg by mouth in the morning., Disp: , Rfl:    Omega-3 Fatty Acids (FISH OIL PO), Take 2,200 mg by mouth in the morning., Disp: , Rfl:    omeprazole (PRILOSEC) 40 MG capsule, Take 40 mg, 1 capsule, by mouth once to twice daily, Disp: 180 capsule, Rfl: 3   predniSONE (DELTASONE) 20 MG tablet, Take 20 mg by mouth as needed., Disp: , Rfl:    primidone (MYSOLINE) 50 MG tablet, Take 100 mg by mouth every evening., Disp: , Rfl:    tadalafil (CIALIS) 20 MG tablet, Take 1 tablet (20 mg total) by mouth daily as needed for erectile dysfunction. (Patient taking differently: Take 10 mg by mouth daily.), Disp: 90 tablet, Rfl:  3   tamsulosin (FLOMAX) 0.4 MG CAPS capsule, Take 0.4 mg by mouth in the morning., Disp: , Rfl:    traZODone (DESYREL) 100 MG tablet, TAKE 1 TABLET AT BEDTIME, Disp: 90 tablet, Rfl: 1   TURMERIC PO, Take 2,250 mg by mouth in the morning., Disp: , Rfl:    zaleplon (SONATA) 10 MG capsule, Take 10 mg by mouth at bedtime., Disp: , Rfl:   Observations/Objective: Patient is well-developed, well-nourished in no acute distress.  Resting comfortably at home.  Head is normocephalic, atraumatic.  No labored breathing.  Speech is clear and coherent with logical content.  Patient is alert and oriented at baseline.    Assessment and Plan: 1. Acute otitis media, unspecified otitis media type - ciprofloxacin-dexamethasone (CIPRODEX) OTIC suspension; Place 4 drops into the left ear 2 (two) times daily. For 7 days  Dispense: 7.5 mL; Refill: 0  2. Seasonal allergic rhinitis, unspecified trigger -  Azelastine-Fluticasone 137-50 MCG/ACT SUSP; Place 1 spray into the nose in the morning and at bedtime.  Dispense: 23 g; Refill: 5    Follow Up Instructions: I discussed the assessment and treatment plan with the patient. The patient was provided an opportunity to ask questions and all were answered. The patient agreed with the plan and demonstrated an understanding of the instructions.  A copy of instructions were sent to the patient via MyChart unless otherwise noted below.    The patient was advised to call back or seek an in-person evaluation if the symptoms worsen or if the condition fails to improve as anticipated.  Time:  I spent 11 minutes with the patient via telehealth technology discussing the above problems/concerns.    Claiborne Rigg, NP

## 2022-07-10 DIAGNOSIS — M25662 Stiffness of left knee, not elsewhere classified: Secondary | ICD-10-CM | POA: Diagnosis not present

## 2022-07-10 DIAGNOSIS — M94262 Chondromalacia, left knee: Secondary | ICD-10-CM | POA: Diagnosis not present

## 2022-07-11 ENCOUNTER — Encounter: Payer: Self-pay | Admitting: Gastroenterology

## 2022-07-11 ENCOUNTER — Ambulatory Visit (AMBULATORY_SURGERY_CENTER): Payer: Medicare Other

## 2022-07-11 VITALS — Ht 70.0 in | Wt 209.0 lb

## 2022-07-11 DIAGNOSIS — Z800 Family history of malignant neoplasm of digestive organs: Secondary | ICD-10-CM

## 2022-07-11 DIAGNOSIS — Z86010 Personal history of colonic polyps: Secondary | ICD-10-CM

## 2022-07-11 MED ORDER — NA SULFATE-K SULFATE-MG SULF 17.5-3.13-1.6 GM/177ML PO SOLN
1.0000 | Freq: Once | ORAL | 0 refills | Status: AC
Start: 2022-07-11 — End: 2022-07-11

## 2022-07-12 ENCOUNTER — Encounter: Payer: Medicare HMO | Admitting: Psychology

## 2022-07-12 DIAGNOSIS — M94262 Chondromalacia, left knee: Secondary | ICD-10-CM | POA: Diagnosis not present

## 2022-07-12 DIAGNOSIS — M25662 Stiffness of left knee, not elsewhere classified: Secondary | ICD-10-CM | POA: Diagnosis not present

## 2022-07-14 ENCOUNTER — Other Ambulatory Visit: Payer: Self-pay | Admitting: Neurology

## 2022-07-14 DIAGNOSIS — G250 Essential tremor: Secondary | ICD-10-CM

## 2022-07-17 DIAGNOSIS — M25562 Pain in left knee: Secondary | ICD-10-CM | POA: Diagnosis not present

## 2022-07-18 DIAGNOSIS — M25662 Stiffness of left knee, not elsewhere classified: Secondary | ICD-10-CM | POA: Diagnosis not present

## 2022-07-18 DIAGNOSIS — M94262 Chondromalacia, left knee: Secondary | ICD-10-CM | POA: Diagnosis not present

## 2022-07-23 ENCOUNTER — Other Ambulatory Visit: Payer: Self-pay | Admitting: Internal Medicine

## 2022-07-24 DIAGNOSIS — H9202 Otalgia, left ear: Secondary | ICD-10-CM | POA: Diagnosis not present

## 2022-07-24 DIAGNOSIS — M94262 Chondromalacia, left knee: Secondary | ICD-10-CM | POA: Diagnosis not present

## 2022-07-24 DIAGNOSIS — M25662 Stiffness of left knee, not elsewhere classified: Secondary | ICD-10-CM | POA: Diagnosis not present

## 2022-07-25 ENCOUNTER — Encounter: Payer: Self-pay | Admitting: Internal Medicine

## 2022-07-25 ENCOUNTER — Ambulatory Visit (INDEPENDENT_AMBULATORY_CARE_PROVIDER_SITE_OTHER): Payer: Medicare Other | Admitting: Internal Medicine

## 2022-07-25 VITALS — BP 120/80 | HR 70 | Temp 98.0°F | Ht 70.0 in | Wt 214.0 lb

## 2022-07-25 DIAGNOSIS — G250 Essential tremor: Secondary | ICD-10-CM

## 2022-07-25 DIAGNOSIS — K219 Gastro-esophageal reflux disease without esophagitis: Secondary | ICD-10-CM

## 2022-07-25 DIAGNOSIS — N138 Other obstructive and reflux uropathy: Secondary | ICD-10-CM

## 2022-07-25 DIAGNOSIS — J4489 Other specified chronic obstructive pulmonary disease: Secondary | ICD-10-CM | POA: Diagnosis not present

## 2022-07-25 DIAGNOSIS — N401 Enlarged prostate with lower urinary tract symptoms: Secondary | ICD-10-CM

## 2022-07-25 DIAGNOSIS — Z1159 Encounter for screening for other viral diseases: Secondary | ICD-10-CM

## 2022-07-25 DIAGNOSIS — E559 Vitamin D deficiency, unspecified: Secondary | ICD-10-CM | POA: Diagnosis not present

## 2022-07-25 DIAGNOSIS — G4700 Insomnia, unspecified: Secondary | ICD-10-CM

## 2022-07-25 DIAGNOSIS — Z0000 Encounter for general adult medical examination without abnormal findings: Secondary | ICD-10-CM

## 2022-07-25 LAB — CBC WITH DIFFERENTIAL/PLATELET
Basophils Absolute: 0 10*3/uL (ref 0.0–0.1)
Basophils Relative: 0.7 % (ref 0.0–3.0)
Eosinophils Absolute: 0.2 10*3/uL (ref 0.0–0.7)
Eosinophils Relative: 2.3 % (ref 0.0–5.0)
HCT: 39.6 % (ref 39.0–52.0)
Hemoglobin: 12.8 g/dL — ABNORMAL LOW (ref 13.0–17.0)
Lymphocytes Relative: 19.5 % (ref 12.0–46.0)
Lymphs Abs: 1.4 10*3/uL (ref 0.7–4.0)
MCHC: 32.3 g/dL (ref 30.0–36.0)
MCV: 99.5 fl (ref 78.0–100.0)
Monocytes Absolute: 0.6 10*3/uL (ref 0.1–1.0)
Monocytes Relative: 8 % (ref 3.0–12.0)
Neutro Abs: 4.9 10*3/uL (ref 1.4–7.7)
Neutrophils Relative %: 69.5 % (ref 43.0–77.0)
Platelets: 315 10*3/uL (ref 150.0–400.0)
RBC: 3.98 Mil/uL — ABNORMAL LOW (ref 4.22–5.81)
RDW: 14.3 % (ref 11.5–15.5)
WBC: 7.1 10*3/uL (ref 4.0–10.5)

## 2022-07-25 LAB — TSH: TSH: 1.78 u[IU]/mL (ref 0.35–5.50)

## 2022-07-25 LAB — LIPID PANEL
Cholesterol: 186 mg/dL (ref 0–200)
HDL: 72.9 mg/dL (ref >39.00)
LDL Cholesterol: 94 mg/dL (ref 0–99)
NonHDL: 112.96
Total CHOL/HDL Ratio: 3
Triglycerides: 93 mg/dL (ref 0.0–149.0)
VLDL: 18.6 mg/dL (ref 0.0–40.0)

## 2022-07-25 LAB — COMPREHENSIVE METABOLIC PANEL
ALT: 16 U/L (ref 0–53)
AST: 17 U/L (ref 0–37)
Albumin: 4.2 g/dL (ref 3.5–5.2)
Alkaline Phosphatase: 53 U/L (ref 39–117)
BUN: 25 mg/dL — ABNORMAL HIGH (ref 6–23)
CO2: 30 mEq/L (ref 19–32)
Calcium: 9.4 mg/dL (ref 8.4–10.5)
Chloride: 103 mEq/L (ref 96–112)
Creatinine, Ser: 1.02 mg/dL (ref 0.40–1.50)
GFR: 73.68 mL/min (ref >60.00)
Glucose, Bld: 100 mg/dL — ABNORMAL HIGH (ref 70–99)
Potassium: 4.4 mEq/L (ref 3.5–5.1)
Sodium: 141 mEq/L (ref 135–145)
Total Bilirubin: 0.6 mg/dL (ref 0.2–1.2)
Total Protein: 6.3 g/dL (ref 6.0–8.3)

## 2022-07-25 LAB — VITAMIN D 25 HYDROXY (VIT D DEFICIENCY, FRACTURES): VITD: 45.27 ng/mL (ref 30.00–100.00)

## 2022-07-25 LAB — PSA: PSA: 3.59 ng/mL (ref 0.10–4.00)

## 2022-07-25 LAB — VITAMIN B12: Vitamin B-12: >1500 pg/mL — ABNORMAL HIGH (ref 211–911)

## 2022-07-25 NOTE — Progress Notes (Signed)
Established Patient Office Visit     CC/Reason for Visit: Annual preventive exam and subsequent Medicare wellness visit  HPI: Allen [name] is a 72 y.o. male who is coming in today for the above mentioned reasons. Past Medical History is significant for:    Past Medical/Surgical History: 1.  Essential tremor with a brain stimulator followed by Dr. Arbutus Leas.   2.  COPD/asthma, followed by pulmonary   3.  Obstructive sleep apnea on BiPAP.   4.  Right carpal tunnel syndrome.   5.  Bilateral cataracts   6.  GERD causing vocal cord dysfunction.  He has been taking famotidine in the morning and omeprazole in the evening.   7.  He has had recurrent episodes of prostatitis followed by urology.   8.  He has been having a lot of depression.  He saw a psychiatrist who started him on Lexapro and Ambien.   8.  He has cervical radiculopathy and disc disease.    He has been doing well and has no acute concerns or complaints.  All immunizations are up-to-date.  He has his 5-year colonoscopy scheduled for next week.  Has routine eye and dental care.   Past Surgical History:  Procedure Laterality Date   ANKLE SURGERY Right 2020   tendon repair   APPENDECTOMY     CARPAL TUNNEL RELEASE Left 2019   CATARACT EXTRACTION W/ INTRAOCULAR LENS IMPLANT Bilateral 2020   COLONOSCOPY     CYSTOSCOPY WITH URETHRAL DILATATION N/A 08/29/2021   Procedure: CYSTOSCOPY WITH URETHRAL DILATATION;  Surgeon: Noel Christmas, MD;  Location: [facility];  Service: Urology;  Laterality: N/A;   DEEP BRAIN STIMULATOR PLACEMENT Left 2016   INGUINAL HERNIA REPAIR Right 2007   ROTATOR CUFF REPAIR Right 2014   SUBTHALAMIC STIMULATOR BATTERY REPLACEMENT N/A 01/21/2020   Procedure: Deep brain stimulator battery replacement;  Surgeon: Maeola Harman, MD;  Location: [facility] OR;  Service: Neurosurgery;  Laterality: N/A;   TONSILLECTOMY AND ADENOIDECTOMY     age 54   TOTAL SHOULDER REPLACEMENT Left 2016   TRANSURETHRAL RESECTION  OF PROSTATE N/A 08/09/2020   Procedure: TRANSURETHRAL RESECTION OF THE PROSTATE (TURP), BLADDER BIOPSY AND FULGERATION;  Surgeon: Noel Christmas, MD;  Location: [facility] [place];  Service: Urology;  Laterality: N/A;   TRANSURETHRAL RESECTION OF PROSTATE N/A 08/29/2021   Procedure: RE-TRANSURETHRAL RESECTION OF THE PROSTATE (TURP);  Surgeon: Noel Christmas, MD;  Location: [facility];  Service: Urology;  Laterality: N/A;   VEIN SURGERY Left 2006   varicose veins    Social History:  reports that he quit smoking about 33 years ago. His smoking use included cigarettes. He has a 60.00 pack-year smoking history. He has never used smokeless tobacco. He reports current alcohol use of about 1.0 standard drink of alcohol per week. He reports that he does not use drugs.  Allergies: Allergies  Allergen Reactions   Copper-Containing Compounds     Copper gloves caused redness and blisters bilateral hands    Family History:  Family History  Problem Relation Age of Onset   COPD Mother    Asthma Father    Rectal cancer Sister    Healthy Child    Urticaria Neg Hx    Immunodeficiency Neg Hx    Eczema Neg Hx    Atopy Neg Hx    Angioedema Neg Hx    Allergic rhinitis Neg Hx    Colon cancer Neg Hx    Stomach cancer Neg Hx  Esophageal cancer Neg Hx      Current Outpatient Medications:    albuterol (VENTOLIN HFA) 108 (90 Base) MCG/ACT inhaler, Inhale 1-2 puffs into the lungs every 4 (four) hours as needed for wheezing or shortness of breath., Disp: , Rfl:    Azelastine-Fluticasone 137-50 MCG/ACT SUSP, Place 1 spray into the nose in the morning and at bedtime., Disp: 23 g, Rfl: 5   busPIRone (BUSPAR) 30 MG tablet, Take 30 mg by mouth at bedtime., Disp: , Rfl:    carbidopa-levodopa (SINEMET IR) 25-100 MG tablet, Take 1 tablet by mouth 3 (three) times daily. Titrating up takes 1/2 tablet 3 times daily, Disp: , Rfl:    Cholecalciferol (VITAMIN D3) 125 MCG (5000 UT) TABS, Take 5,000 Units by  mouth in the morning., Disp: , Rfl:    Cyanocobalamin 2500 MCG SUBL, Place 5,000 mcg under the tongue daily., Disp: , Rfl:    escitalopram (LEXAPRO) 10 MG tablet, Take 10 mg by mouth daily., Disp: , Rfl:    Fluticasone-Umeclidin-Vilant (TRELEGY ELLIPTA) 100-62.5-25 MCG/ACT AEPB, Inhale 1 puff into the lungs daily., Disp: 180 each, Rfl: 3   Multiple Vitamins-Minerals (PRESERVISION AREDS) TABS, Take 1 tablet by mouth daily., Disp: , Rfl:    mupirocin ointment (BACTROBAN) 2 %, Apply 1 Application topically 3 (three) times daily as needed., Disp: , Rfl:    Omega-3 Fatty Acids (FISH OIL PO), Take 2,200 mg by mouth in the morning., Disp: , Rfl:    omeprazole (PRILOSEC) 40 MG capsule, Take 40 mg, 1 capsule, by mouth once to twice daily (Patient taking differently: Take 40 mg by mouth daily. Take 40 mg, 1 capsule, by mouth once to twice daily), Disp: 180 capsule, Rfl: 3   tadalafil (CIALIS) 20 MG tablet, Take 1 tablet (20 mg total) by mouth daily as needed for erectile dysfunction. (Patient taking differently: Take 10 mg by mouth daily.), Disp: 90 tablet, Rfl: 3   tamsulosin (FLOMAX) 0.4 MG CAPS capsule, Take 0.4 mg by mouth in the morning., Disp: , Rfl:    traZODone (DESYREL) 100 MG tablet, TAKE 1 TABLET AT BEDTIME, Disp: 90 tablet, Rfl: 1   triamcinolone (KENALOG) 0.025 % cream, Apply 1 Application topically 2 (two) times daily as needed., Disp: , Rfl:    TURMERIC PO, Take 2,250 mg by mouth in the morning., Disp: , Rfl:    zaleplon (SONATA) 10 MG capsule, Take 10 mg by mouth at bedtime., Disp: , Rfl:    HYDROcodone-acetaminophen (NORCO/VICODIN) 5-325 MG tablet, Take 1-2 tablets by mouth every 6 (six) hours as needed for moderate pain (for post surgery). (Patient not taking: Reported on 07/24/2022), Disp: , Rfl:   Review of Systems:  Negative unless indicated in HPI.   Physical Exam: Vitals:   07/25/22 0924  BP: 120/80  Pulse: 70  Temp: 98 F (36.7 C)  TempSrc: Oral  SpO2: 98%  Weight: 214 lb  (97.1 kg)  Height: 5\' 10"  (1.778 m)    Body mass index is 30.71 kg/m.   Physical Exam Vitals reviewed.  Constitutional:      General: He is not in acute distress.    Appearance: Normal appearance. He is not ill-appearing, toxic-appearing or diaphoretic.  HENT:     Head: Normocephalic.     Right Ear: Tympanic membrane, ear canal and external ear normal. There is no impacted cerumen.     Left Ear: Tympanic membrane, ear canal and external ear normal. There is no impacted cerumen.     Nose: Nose normal.  Mouth/Throat:     Mouth: Mucous membranes are moist.     Pharynx: Oropharynx is clear. No oropharyngeal exudate or posterior oropharyngeal erythema.  Eyes:     General: No scleral icterus.       Right eye: No discharge.        Left eye: No discharge.     Conjunctiva/sclera: Conjunctivae normal.     Pupils: Pupils are equal, round, and reactive to light.  Neck:     Vascular: No carotid bruit.  Cardiovascular:     Rate and Rhythm: Normal rate and regular rhythm.     Pulses: Normal pulses.     Heart sounds: Normal heart sounds.  Pulmonary:     Effort: Pulmonary effort is normal. No respiratory distress.     Breath sounds: Normal breath sounds.  Abdominal:     General: Abdomen is flat. Bowel sounds are normal.     Palpations: Abdomen is soft.  Musculoskeletal:        General: Normal range of motion.     Cervical back: Normal range of motion.  Skin:    General: Skin is warm and dry.  Neurological:     General: No focal deficit present.     Mental Status: He is alert and oriented to person, place, and time. Mental status is at baseline.  Psychiatric:        Mood and Affect: Mood normal.        Behavior: Behavior normal.        Thought Content: Thought content normal.        Judgment: Judgment normal.   Subsequent Medicare wellness visit   1. Risk factors, based on past  M,S,F - Cardiac Risk Factors include: advanced age (>48men, >36 women);male gender   2.  Physical  activities: Dietary issues and exercise activities discussed:      3.  Depression/mood:  Flowsheet Row Office Visit from 07/25/2022 in [facility] Total Score 1        4.  ADL's:    07/24/2022    4:45 PM 08/22/2021    8:55 AM  In your present state of health, do you have any difficulty performing the following activities:  Hearing? 0   Vision? 0   Difficulty concentrating or making decisions? 0   Walking or climbing stairs? 1   Comment copd   Dressing or bathing? 0   Doing errands, shopping? 0 0  Preparing Food and eating ? N   Using the Toilet? N   In the past six months, have you accidently leaked urine? N   Do you have problems with loss of bowel control? N   Managing your Medications? N   Managing your Finances? N   Housekeeping or managing your Housekeeping? N      5.  Fall risk:     07/31/2021   12:52 PM 09/28/2021    7:55 AM 11/15/2021    9:07 AM 02/05/2022   10:16 AM 07/24/2022    4:48 PM  Fall Risk  Falls in the past year? 1 1 1 1  0  Was there an injury with Fall? 1 1 1  0 0  Fall Risk Category Calculator 2 3 3 1  0  Fall Risk Category (Retired) Moderate High High    (RETIRED) Patient Fall Risk Level Low fall risk Moderate fall risk Moderate fall risk    Fall risk Follow up   Falls evaluation completed Falls evaluation completed Falls evaluation completed  6.  Home safety: No problems identified   7.  Height weight, and visual acuity: height and weight as above, vision/hearing: Vision Screening   Right eye Left eye Both eyes  Without correction     With correction 20/25 20/25 20/25      8.  Counseling: Counseling given: Not Answered    9. Lab orders based on risk factors: Laboratory update will be reviewed   10. Cognitive assessment:      09/29/2021    6:00 AM  Montreal Cognitive Assessment   Visuospatial/ Executive (0/5) 2  Naming (0/3) 3  Attention: Read list of digits (0/2) 2  Attention: Read list of  letters (0/1) 1  Attention: Serial 7 subtraction starting at 100 (0/3) 3  Language: Repeat phrase (0/2) 1  Language : Fluency (0/1) 0  Abstraction (0/2) 2  Delayed Recall (0/5) 3  Orientation (0/6) 5  Total 22  Adjusted Score (based on education) 22      07/24/2022    4:49 PM  6CIT Screen  What Year? 0 points  What month? 0 points  What time? 0 points  Count back from 20 0 points  Months in reverse 0 points  Repeat phrase 0 points  Total Score 0 points     11. Screening: Patient provided with a written and personalized 5-10 year screening schedule in the AVS. Health Maintenance  Topic Date Due   Hepatitis C Screening  Never done   Pneumonia Vaccine (2 of 2 - PCV) 12/16/2018   COVID-19 Vaccine (7 - 2023-24 season) 12/01/2021   Flu Shot  08/16/2022   Medicare Annual Wellness Visit  07/25/2023   DTaP/Tdap/Td vaccine (2 - Td or Tdap) 09/18/2026   Colon Cancer Screening  09/16/2027   Zoster (Shingles) Vaccine  Completed   HPV Vaccine  Aged Out    12. Provider List Update: Patient Care Team    Relationship Specialty Notifications Start End  Philip Aspen, [name], MD PCP - General Internal Medicine  09/18/18   [name], MD PCP - Cardiology Cardiology  05/26/19   Tat, Octaviano Batty, DO Consulting Physician Neurology  07/04/20   Verner Chol, [facility] (Inactive) Pharmacist Pharmacist  02/14/21    Comment: [phone]  Janalyn Harder, MD (Inactive) Consulting Physician Dermatology  07/27/21      13. Advance Directives: Does Patient Have a Medical Advance Directive?: Yes Type of Advance Directive: Living will, Healthcare Power of Attorney, Out of facility DNR (pink MOST or yellow form) Does patient want to make changes to medical advance directive?: No - Patient declined Copy of Healthcare Power of Attorney in Chart?: No - copy requested  14. Opioids: Patient is not on any opioid prescriptions and has no risk factors for a substance use disorder.   15.   Goals       Travel         I have personally reviewed and noted the following in the patient's chart:   Medical and social history Use of alcohol, tobacco or illicit drugs  Current medications and supplements Functional ability and status Nutritional status Physical activity Advanced directives List of other physicians Hospitalizations, surgeries, and ER visits in previous 12 months Vitals Screenings to include cognitive, depression, and falls Referrals and appointments  In addition, I have reviewed and discussed with patient certain preventive protocols, quality metrics, and best practice recommendations. A written personalized care plan for preventive services as well as general preventive health recommendations were provided to patient.   Impression and Plan:  Medicare annual wellness visit, subsequent  Vitamin D deficiency -     VITAMIN D 25 Hydroxy (Vit-D Deficiency, Fractures); Future  BPH with obstruction/lower urinary tract symptoms -     PSA; Future  Encounter for preventive health examination  Gastroesophageal reflux disease without esophagitis  Essential tremor -     CBC with Differential/Platelet; Future -     Comprehensive metabolic panel; Future -     Lipid panel; Future  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Insomnia, unspecified type -     TSH; Future -     Vitamin B12; Future  Encounter for hepatitis C screening test for low risk patient -     Hepatitis C antibody; Future  -Recommend routine eye and dental care. -Healthy lifestyle discussed in detail. -Labs to be updated today. -Prostate cancer screening: PSA today Health Maintenance  Topic Date Due   Hepatitis C Screening  Never done   Pneumonia Vaccine (2 of 2 - PCV) 12/16/2018   COVID-19 Vaccine (7 - 2023-24 season) 12/01/2021   Flu Shot  08/16/2022   Medicare Annual Wellness Visit  07/25/2023   DTaP/Tdap/Td vaccine (2 - Td or Tdap) 09/18/2026   Colon Cancer Screening  09/16/2027   Zoster (Shingles) Vaccine   Completed   HPV Vaccine  Aged Out         Isaly Fasching Philip Aspen, MD [facility]

## 2022-07-26 LAB — HEPATITIS C ANTIBODY: Hepatitis C Ab: NONREACTIVE

## 2022-07-27 ENCOUNTER — Encounter: Payer: Self-pay | Admitting: Internal Medicine

## 2022-07-27 DIAGNOSIS — M1712 Unilateral primary osteoarthritis, left knee: Secondary | ICD-10-CM

## 2022-07-27 DIAGNOSIS — Z13820 Encounter for screening for osteoporosis: Secondary | ICD-10-CM

## 2022-07-27 DIAGNOSIS — M94262 Chondromalacia, left knee: Secondary | ICD-10-CM | POA: Diagnosis not present

## 2022-07-27 DIAGNOSIS — M25662 Stiffness of left knee, not elsewhere classified: Secondary | ICD-10-CM | POA: Diagnosis not present

## 2022-07-27 DIAGNOSIS — E559 Vitamin D deficiency, unspecified: Secondary | ICD-10-CM

## 2022-07-27 DIAGNOSIS — M47812 Spondylosis without myelopathy or radiculopathy, cervical region: Secondary | ICD-10-CM

## 2022-07-27 DIAGNOSIS — M5136 Other intervertebral disc degeneration, lumbar region: Secondary | ICD-10-CM

## 2022-07-27 DIAGNOSIS — M5030 Other cervical disc degeneration, unspecified cervical region: Secondary | ICD-10-CM

## 2022-07-29 ENCOUNTER — Other Ambulatory Visit: Payer: Self-pay | Admitting: Neurology

## 2022-07-29 DIAGNOSIS — G250 Essential tremor: Secondary | ICD-10-CM

## 2022-07-30 NOTE — Telephone Encounter (Signed)
 Okay to order?

## 2022-07-30 NOTE — Telephone Encounter (Signed)
Called patient  he is no longer taking this medication

## 2022-07-31 ENCOUNTER — Encounter: Payer: Self-pay | Admitting: Certified Registered Nurse Anesthetist

## 2022-08-01 ENCOUNTER — Encounter: Payer: Self-pay | Admitting: Gastroenterology

## 2022-08-01 ENCOUNTER — Ambulatory Visit (AMBULATORY_SURGERY_CENTER): Payer: Medicare Other | Admitting: Gastroenterology

## 2022-08-01 VITALS — BP 113/67 | HR 59 | Temp 98.4°F | Resp 12 | Ht 70.0 in | Wt 209.0 lb

## 2022-08-01 DIAGNOSIS — D122 Benign neoplasm of ascending colon: Secondary | ICD-10-CM | POA: Diagnosis not present

## 2022-08-01 DIAGNOSIS — K5120 Ulcerative (chronic) proctitis without complications: Secondary | ICD-10-CM

## 2022-08-01 DIAGNOSIS — Z09 Encounter for follow-up examination after completed treatment for conditions other than malignant neoplasm: Secondary | ICD-10-CM | POA: Diagnosis not present

## 2022-08-01 DIAGNOSIS — D125 Benign neoplasm of sigmoid colon: Secondary | ICD-10-CM

## 2022-08-01 DIAGNOSIS — K6289 Other specified diseases of anus and rectum: Secondary | ICD-10-CM | POA: Diagnosis not present

## 2022-08-01 DIAGNOSIS — Z800 Family history of malignant neoplasm of digestive organs: Secondary | ICD-10-CM

## 2022-08-01 DIAGNOSIS — J449 Chronic obstructive pulmonary disease, unspecified: Secondary | ICD-10-CM | POA: Diagnosis not present

## 2022-08-01 DIAGNOSIS — D123 Benign neoplasm of transverse colon: Secondary | ICD-10-CM | POA: Diagnosis not present

## 2022-08-01 DIAGNOSIS — D120 Benign neoplasm of cecum: Secondary | ICD-10-CM

## 2022-08-01 DIAGNOSIS — K635 Polyp of colon: Secondary | ICD-10-CM | POA: Diagnosis not present

## 2022-08-01 DIAGNOSIS — Z86010 Personal history of colonic polyps: Secondary | ICD-10-CM

## 2022-08-01 DIAGNOSIS — G4733 Obstructive sleep apnea (adult) (pediatric): Secondary | ICD-10-CM | POA: Diagnosis not present

## 2022-08-01 MED ORDER — SODIUM CHLORIDE 0.9 % IV SOLN
500.0000 mL | Freq: Once | INTRAVENOUS | Status: DC
Start: 2022-08-01 — End: 2022-08-01

## 2022-08-01 NOTE — Op Note (Signed)
[facility] Patient Name: Allen [name] Procedure Date: 08/01/2022 8:26 AM MRN: [id number] Endoscopist: Viviann Spare P. Adela Lank , MD, [id number] Age: 72 Referring MD:  Date of Birth: 25-Oct-1950 Gender: Male Account #: [id number] Procedure:                Colonoscopy Indications:              High risk colon cancer surveillance: Personal                            history of colonic polyps - polyps removed in                            [place] 06/2017, sister with CRC dx age 60s.                            Asymptomatic Medicines:                Monitored Anesthesia Care Procedure:                Pre-Anesthesia Assessment:                           - Prior to the procedure, a History and Physical                            was performed, and patient medications and                            allergies were reviewed. The patient's tolerance of                            previous anesthesia was also reviewed. The risks                            and benefits of the procedure and the sedation                            options and risks were discussed with the patient.                            All questions were answered, and informed consent                            was obtained. Prior Anticoagulants: The patient has                            taken no anticoagulant or antiplatelet agents. ASA                            Grade Assessment: III - A patient with severe                            systemic disease. After reviewing the risks and  benefits, the patient was deemed in satisfactory                            condition to undergo the procedure.                           After obtaining informed consent, the colonoscope                            was passed under direct vision. Throughout the                            procedure, the patient's blood pressure, pulse, and                            oxygen saturations were monitored continuously. The                             CF [id number] #[id number] was introduced through the anus                            and advanced to the the cecum, identified by                            appendiceal orifice and ileocecal valve. The                            colonoscopy was performed without difficulty. The                            patient tolerated the procedure well. The quality                            of the bowel preparation was adequate. The                            ileocecal valve, appendiceal orifice, and rectum                            were photographed. Scope In: 8:38:08 AM Scope Out: 9:07:36 AM Scope Withdrawal Time: 0 hours 24 minutes 29 seconds  Total Procedure Duration: 0 hours 29 minutes 28 seconds  Findings:                 The perianal and digital rectal examinations were                            normal.                           Six flat and sessile polyps were found in the                            cecum. The polyps were 2 to 4 mm in size. These  polyps were removed with a cold snare. Resection                            and retrieval were complete.                           Four flat and sessile polyps were found in the                            ascending colon. The polyps were 3 to 4 mm in size.                            These polyps were removed with a cold snare.                            Resection and retrieval were complete.                           A 5 mm polyp was found in the transverse colon. The                            polyp was flat. The polyp was removed with a cold                            snare. Resection and retrieval were complete.                           Two sessile polyps were found in the sigmoid colon.                            The polyps were 3 to 4 mm in size. These polyps                            were removed with a cold snare. Resection and                            retrieval were complete.                            Multiple small-mouthed diverticula were found in                            the sigmoid colon and ascending colon.                           Localized mild inflammation characterized by                            erythema, friability and granularity was found in                            the distal rectum consistent with mild proctitis.  Biopsies were taken with a cold forceps for                            histology.                           Internal hemorrhoids were found during retroflexion.                           The exam was otherwise without abnormality. Complications:            No immediate complications. Estimated blood loss:                            Minimal. Estimated Blood Loss:     Estimated blood loss was minimal. Impression:               - Six 2 to 4 mm polyps in the cecum, removed with a                            cold snare. Resected and retrieved.                           - Four 3 to 4 mm polyps in the ascending colon,                            removed with a cold snare. Resected and retrieved.                           - One 5 mm polyp in the transverse colon, removed                            with a cold snare. Resected and retrieved.                           - Two 3 to 4 mm polyps in the sigmoid colon,                            removed with a cold snare. Resected and retrieved.                           - Diverticulosis in the sigmoid colon and in the                            ascending colon.                           - Localized mild inflammation was found in the                            distal rectum. Biopsied.                           - Internal hemorrhoids.                           -  The examination was otherwise normal. Recommendation:           - Patient has a contact number available for                            emergencies. The signs and symptoms of potential                            delayed  complications were discussed with the                            patient. Return to normal activities tomorrow.                            Written discharge instructions were provided to the                            patient.                           - Resume previous diet.                           - Continue present medications.                           - Await pathology results. Viviann Spare P. Lysette Lindenbaum, MD 08/01/2022 9:15:14 AM This report has been signed electronically.

## 2022-08-01 NOTE — Patient Instructions (Addendum)
    Handouts on polyps,diverticulosis,& hemorrhoids given to you today.   Await pathology results on polyps removed & ON BIOPSIES OF RECTAL AREA RESULTS   YOU HAD AN ENDOSCOPIC PROCEDURE TODAY AT THE [facility]:   Refer to the procedure report that was given to you for any specific questions about what was found during the examination.  If the procedure report does not answer your questions, please call your gastroenterologist to clarify.  If you requested that your care partner not be given the details of your procedure findings, then the procedure report has been included in a sealed envelope for you to review at your convenience later.  YOU SHOULD EXPECT: Some feelings of bloating in the abdomen. Passage of more gas than usual.  Walking can help get rid of the air that was put into your GI tract during the procedure and reduce the bloating. If you had a lower endoscopy (such as a colonoscopy or flexible sigmoidoscopy) you may notice spotting of blood in your stool or on the toilet paper. If you underwent a bowel prep for your procedure, you may not have a normal bowel movement for a few days.  Please Note:  You might notice some irritation and congestion in your nose or some drainage.  This is from the oxygen used during your procedure.  There is no need for concern and it should clear up in a day or so.  SYMPTOMS TO REPORT IMMEDIATELY:  Following lower endoscopy (colonoscopy or flexible sigmoidoscopy):  Excessive amounts of blood in the stool  Significant tenderness or worsening of abdominal pains  Swelling of the abdomen that is new, acute  Fever of 100F or higher    For urgent or emergent issues, a gastroenterologist can be reached at any hour by calling [phone]. Do not use MyChart messaging for urgent concerns.    DIET:  We do recommend a small meal at first, but then you may proceed to your regular diet.  Drink plenty of fluids but you should avoid alcoholic  beverages for 24 hours.  ACTIVITY:  You should plan to take it easy for the rest of today and you should NOT DRIVE or use heavy machinery until tomorrow (because of the sedation medicines used during the test).    FOLLOW UP: Our staff will call the number listed on your records the next business day following your procedure.  We will call around 7:15- 8:00 am to check on you and address any questions or concerns that you may have regarding the information given to you following your procedure. If we do not reach you, we will leave a message.     If any biopsies were taken you will be contacted by phone or by letter within the next 1-3 weeks.  Please call us at [phone] if you have not heard about the biopsies in 3 weeks.    SIGNATURES/CONFIDENTIALITY: You and/or your care partner have signed paperwork which will be entered into your electronic medical record.  These signatures attest to the fact that that the information above on your After Visit Summary has been reviewed and is understood.  Full responsibility of the confidentiality of this discharge information lies with you and/or your care-partner.

## 2022-08-01 NOTE — Progress Notes (Signed)
[facility] History and Physical   Primary Care Physician:  Allen [name], Allen Patricia, [name]   Reason for Procedure:   History of colon polyps, family history of colon cancer  Plan:    colonoscopy     HPI: Allen [name] is a 72 y.o. male  here for colonoscopy surveillance - history of polyps removed 06/2017. Sister had colon cancer dx age 34s and wife has had colon cancer.  Otherwise feels well without any cardiopulmonary symptoms.   I have discussed risks / benefits of anesthesia and endoscopic procedure with Allen [name] and they wish to proceed with the exams as outlined today.    Past Medical History:  Diagnosis Date   Anxiety    Arthritis    neck -   BPH with obstruction/lower urinary tract symptoms    urologist-- dr pace   Cancer [facility])    basal cell to nose   Centrilobular emphysema (HCC)    followed by dr Craige Cotta   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followed by dr Craige Cotta   Cough 07/2020   covid residual, no congestion   DOE (dyspnea on exertion)    due to copd/ emphysema   ED (erectile dysfunction)    Essential tremor    neurologist--- dr tat----  bilateral upper extremities,  s/p DPS 12/ 2016   GERD (gastroesophageal reflux disease)    History of 2019 novel coronavirus disease (COVID-19) 07/18/2020   per pt postive home test 07-18-2020 w/ mild  symptoms per pt; pt stated had urgent care visit 07-24-2020 for sob/ cough with negative cxr (in care everywhere) ;  follow up pcp visit 08-03-2020 in epic   Insomnia    Mixed simple and mucopurulent chronic bronchitis (HCC)    OSA (obstructive sleep apnea)    followed by dr Craige Cotta---- no longer does uses bipap, intolerant --- pt prescriped  Oxygen  2 L oxygen via [place] while sleeping  (study in epic 04/ 2017 AHI 40)   Perennial allergic rhinitis    Pneumonia    Pulmonary nodules    followed by dr Craige Cotta   S/P deep brain stimulator placement 12/2014   followed by neurology-----IPG change 01-21-2020 for end-of life battery  (pt has  a control)   Varicose vein of leg    per pt has had 3 procedure's last one 2006   Wears dentures    upper    Past Surgical History:  Procedure Laterality Date   ANKLE SURGERY Right 2020   tendon repair   APPENDECTOMY     CARPAL TUNNEL RELEASE Left 2019   CATARACT EXTRACTION W/ INTRAOCULAR LENS IMPLANT Bilateral 2020   COLONOSCOPY     CYSTOSCOPY WITH URETHRAL DILATATION N/A 08/29/2021   Procedure: CYSTOSCOPY WITH URETHRAL DILATATION;  Surgeon: Allen Christmas, [name];  Location: [facility];  Service: Urology;  Laterality: N/A;   DEEP BRAIN STIMULATOR PLACEMENT Left 2016   INGUINAL HERNIA REPAIR Right 2007   KNEE ARTHROSCOPY W/ MENISCECTOMY Right 06/25/2022   ROTATOR CUFF REPAIR Right 2014   SUBTHALAMIC STIMULATOR BATTERY REPLACEMENT N/A 01/21/2020   Procedure: Deep brain stimulator battery replacement;  Surgeon: Allen Harman, [name];  Location: [facility] OR;  Service: Neurosurgery;  Laterality: N/A;   TONSILLECTOMY AND ADENOIDECTOMY     age 49   TOTAL SHOULDER REPLACEMENT Left 2016   TRANSURETHRAL RESECTION OF PROSTATE N/A 08/09/2020   Procedure: TRANSURETHRAL RESECTION OF THE PROSTATE (TURP), BLADDER BIOPSY AND FULGERATION;  Surgeon: Allen Christmas, [name];  Location: [facility];  Service: Urology;  Laterality: N/A;   TRANSURETHRAL RESECTION OF PROSTATE N/A 08/29/2021   Procedure: RE-TRANSURETHRAL RESECTION OF THE PROSTATE (TURP);  Surgeon: Allen Christmas, [name];  Location: [facility];  Service: Urology;  Laterality: N/A;   VEIN SURGERY Left 2006   varicose veins    Prior to Admission medications   Medication Sig Start Date End Date Taking? Authorizing Provider  Fluticasone-Umeclidin-Vilant (TRELEGY ELLIPTA) 100-62.5-25 MCG/ACT AEPB Inhale 1 puff into the lungs daily. 01/19/22  Yes Allen Helling, [name]  albuterol (VENTOLIN HFA) 108 (90 Base) MCG/ACT inhaler Inhale 1-2 puffs into the lungs [name] 4 (four) hours as needed for wheezing or shortness of breath. 06/15/20   Provider, Historical, [name]   Azelastine-Fluticasone 137-50 MCG/ACT SUSP Place 1 spray into the nose in the morning and at bedtime. 07/07/22   Allen Rigg, NP  busPIRone (BUSPAR) 30 MG tablet Take 30 mg by mouth at bedtime. Patient not taking: Reported on 08/01/2022    Provider, Historical, [name]  carbidopa-levodopa (SINEMET IR) 25-100 MG tablet Take 1 tablet by mouth 3 (three) times daily. Titrating up takes 1/2 tablet 3 times daily 05/29/22 05/29/23  Provider, Historical, [name]  Cholecalciferol (VITAMIN D3) 125 MCG (5000 UT) TABS Take 5,000 Units by mouth in the morning.    Provider, Historical, [name]  Cyanocobalamin 2500 MCG SUBL Place 5,000 mcg under the tongue daily.    Provider, Historical, [name]  escitalopram (LEXAPRO) 10 MG tablet Take 10 mg by mouth daily. 06/16/22   Provider, Historical, [name]  HYDROcodone-acetaminophen (NORCO/VICODIN) 5-325 MG tablet Take 1-2 tablets by mouth [name] 6 (six) hours as needed for moderate pain (for post surgery). Patient not taking: Reported on 07/24/2022 06/25/22   Provider, Historical, [name]  Multiple Vitamins-Minerals (PRESERVISION AREDS) TABS Take 1 tablet by mouth daily.    Provider, Historical, [name]  mupirocin ointment (BACTROBAN) 2 % Apply 1 Application topically 3 (three) times daily as needed. Patient not taking: Reported on 08/01/2022    Provider, Historical, [name]  Omega-3 Fatty Acids (FISH OIL PO) Take 2,200 mg by mouth in the morning.    Provider, Historical, [name]  omeprazole (PRILOSEC) 40 MG capsule Take 40 mg, 1 capsule, by mouth once to twice daily Patient taking differently: Take 40 mg by mouth daily. Take 40 mg, 1 capsule, by mouth once to twice daily 03/06/22   Allen [name], Willaim Rayas, [name]  tadalafil (CIALIS) 20 MG tablet Take 1 tablet (20 mg total) by mouth daily as needed for erectile dysfunction. Patient taking differently: Take 10 mg by mouth daily. 12/31/19   Allen [name], Allen Patricia, [name]  tamsulosin (FLOMAX) 0.4 MG CAPS capsule Take 0.4 mg by mouth in the morning.    Provider, Historical, [name]   traZODone (DESYREL) 100 MG tablet TAKE 1 TABLET AT BEDTIME 06/08/21   Allen [name], Allen Patricia, [name]  triamcinolone (KENALOG) 0.025 % cream Apply 1 Application topically 2 (two) times daily as needed.    Provider, Historical, [name]  TURMERIC PO Take 2,250 mg by mouth in the morning. Patient not taking: Reported on 08/01/2022    Provider, Historical, [name]  zaleplon (SONATA) 10 MG capsule Take 10 mg by mouth at bedtime. 06/01/21   Provider, Historical, [name]    Current Outpatient Medications  Medication Sig Dispense Refill   Fluticasone-Umeclidin-Vilant (TRELEGY ELLIPTA) 100-62.5-25 MCG/ACT AEPB Inhale 1 puff into the lungs daily. 180 each 3   albuterol (VENTOLIN HFA) 108 (90 Base) MCG/ACT inhaler Inhale 1-2 puffs into the lungs [name] 4 (four) hours as needed for wheezing or  shortness of breath.     Azelastine-Fluticasone 137-50 MCG/ACT SUSP Place 1 spray into the nose in the morning and at bedtime. 23 g 5   busPIRone (BUSPAR) 30 MG tablet Take 30 mg by mouth at bedtime. (Patient not taking: Reported on 08/01/2022)     carbidopa-levodopa (SINEMET IR) 25-100 MG tablet Take 1 tablet by mouth 3 (three) times daily. Titrating up takes 1/2 tablet 3 times daily     Cholecalciferol (VITAMIN D3) 125 MCG (5000 UT) TABS Take 5,000 Units by mouth in the morning.     Cyanocobalamin 2500 MCG SUBL Place 5,000 mcg under the tongue daily.     escitalopram (LEXAPRO) 10 MG tablet Take 10 mg by mouth daily.     HYDROcodone-acetaminophen (NORCO/VICODIN) 5-325 MG tablet Take 1-2 tablets by mouth [name] 6 (six) hours as needed for moderate pain (for post surgery). (Patient not taking: Reported on 07/24/2022)     Multiple Vitamins-Minerals (PRESERVISION AREDS) TABS Take 1 tablet by mouth daily.     mupirocin ointment (BACTROBAN) 2 % Apply 1 Application topically 3 (three) times daily as needed. (Patient not taking: Reported on 08/01/2022)     Omega-3 Fatty Acids (FISH OIL PO) Take 2,200 mg by mouth in the morning.     omeprazole  (PRILOSEC) 40 MG capsule Take 40 mg, 1 capsule, by mouth once to twice daily (Patient taking differently: Take 40 mg by mouth daily. Take 40 mg, 1 capsule, by mouth once to twice daily) 180 capsule 3   tadalafil (CIALIS) 20 MG tablet Take 1 tablet (20 mg total) by mouth daily as needed for erectile dysfunction. (Patient taking differently: Take 10 mg by mouth daily.) 90 tablet 3   tamsulosin (FLOMAX) 0.4 MG CAPS capsule Take 0.4 mg by mouth in the morning.     traZODone (DESYREL) 100 MG tablet TAKE 1 TABLET AT BEDTIME 90 tablet 1   triamcinolone (KENALOG) 0.025 % cream Apply 1 Application topically 2 (two) times daily as needed.     TURMERIC PO Take 2,250 mg by mouth in the morning. (Patient not taking: Reported on 08/01/2022)     zaleplon (SONATA) 10 MG capsule Take 10 mg by mouth at bedtime.     Current Facility-Administered Medications  Medication Dose Route Frequency Provider Last Rate Last Admin   0.9 %  sodium chloride infusion  500 mL Intravenous Once Kynzie Polgar, Willaim Rayas, [name]        Allergies as of 08/01/2022 - Review Complete 08/01/2022  Allergen Reaction Noted   Copper-containing compounds  07/20/2021    Family History  Problem Relation Age of Onset   COPD Mother    Asthma Father    Rectal cancer Sister    Healthy Child    Urticaria Neg Hx    Immunodeficiency Neg Hx    Eczema Neg Hx    Atopy Neg Hx    Angioedema Neg Hx    Allergic rhinitis Neg Hx    Colon cancer Neg Hx    Stomach cancer Neg Hx    Esophageal cancer Neg Hx     Social History   Socioeconomic History   Marital status: Married    Spouse name: Not on file   Number of children: 3   Years of education: Not on file   Highest education level: Associate degree: occupational, Scientist, product/process development, or vocational program  Occupational History   Not on file  Tobacco Use   Smoking status: Former    Current packs/day: 0.00    Average packs/day:  3.0 packs/day for 20.0 years (60.0 ttl pk-yrs)    Types: Cigarettes    Start  date: 58    Quit date: 73    Years since quitting: 33.5   Smokeless tobacco: Never  Vaping Use   Vaping status: Never Used  Substance and Sexual Activity   Alcohol use: Yes    Alcohol/week: 1.0 standard drink of alcohol    Types: 1 Glasses of wine per week    Comment: occassionally   Drug use: Never   Sexual activity: Yes  Other Topics Concern   Not on file  Social History Narrative   Right handed   Drinks caffeine   One level home   Retired   Lives with wife   Social Determinants of Health   Financial Resource Strain: Low Risk  (07/24/2022)   Overall Financial Resource Strain (CARDIA)    Difficulty of Paying Living Expenses: Not very hard  Food Insecurity: No Food Insecurity (07/24/2022)   Hunger Vital Sign    Worried About Running Out of Food in the Last Year: Never true    Ran Out of Food in the Last Year: Never true  Transportation Needs: No Transportation Needs (03/20/2021)   PRAPARE - Administrator, Civil Service (Medical): No    Lack of Transportation (Non-Medical): No  Physical Activity: Sufficiently Active (07/24/2022)   Exercise Vital Sign    Days of Exercise per Week: 7 days    Minutes of Exercise per Session: 30 min  Stress: No Stress Concern Present (07/24/2022)   [employer] of Occupational Health - Occupational Stress Questionnaire    Feeling of Stress : Not at all  Social Connections: Moderately Isolated (07/24/2022)   Social Connection and Isolation Panel [NHANES]    Frequency of Communication with Friends and Family: More than three times a week    Frequency of Social Gatherings with Friends and Family: Three times a week    Attends Religious Services: Never    Active Member of Clubs or Organizations: No    Attends Banker Meetings: Never    Marital Status: Married  Catering manager Violence: Not At Risk (07/24/2022)   Humiliation, Afraid, Rape, and Kick questionnaire    Fear of Current or Ex-Partner: No    Emotionally  Abused: No    Physically Abused: No    Sexually Abused: No    Review of Systems: All other review of systems negative except as mentioned in the HPI.  Physical Exam: Vital signs BP 130/76   Pulse 63   Temp 98.4 F (36.9 C) (Skin)   Ht 5\' 10"  (1.778 m)   Wt 209 lb (94.8 kg)   SpO2 98%   BMI 29.99 kg/m   General:   Alert,  Well-developed, pleasant and cooperative in NAD Lungs:  Clear throughout to auscultation.   Heart:  Regular rate and rhythm Abdomen:  Soft, nontender and nondistended.   Neuro/Psych:  Alert and cooperative. Normal mood and affect. A and O x 3  Harlin Rain, [name] [facility]

## 2022-08-01 NOTE — Progress Notes (Signed)
 Report given to PACU, vss 

## 2022-08-01 NOTE — Progress Notes (Signed)
 VS by DT  Pt's states no medical or surgical changes since previsit or office visit.  

## 2022-08-01 NOTE — Progress Notes (Signed)
 Called to room to assist during endoscopic procedure.  Patient ID and intended procedure confirmed with present staff. Received instructions for my participation in the procedure from the performing physician.  

## 2022-08-02 ENCOUNTER — Telehealth: Payer: Self-pay

## 2022-08-02 NOTE — Telephone Encounter (Signed)
  Follow up Call-     08/01/2022    7:39 AM 11/16/2021    1:34 PM 03/24/2021    8:59 AM  Call back number  Post procedure Call Back phone  # [phone]  Permission to leave phone message Yes Yes      Patient questions:  Do you have a fever, pain , or abdominal swelling? No. Pain Score  0 *  Have you tolerated food without any problems? Yes.    Have you been able to return to your normal activities? Yes.    Do you have any questions about your discharge instructions: Diet   No. Medications  No. Follow up visit  No.  Do you have questions or concerns about your Care? No.  Actions: * If pain score is 4 or above: No action needed, pain <4.

## 2022-08-22 ENCOUNTER — Ambulatory Visit (HOSPITAL_BASED_OUTPATIENT_CLINIC_OR_DEPARTMENT_OTHER)
Admission: RE | Admit: 2022-08-22 | Discharge: 2022-08-22 | Disposition: A | Discharge status: Home / Self Care | Payer: Medicare Other | Source: Ambulatory Visit | Attending: Internal Medicine | Admitting: Internal Medicine

## 2022-08-22 DIAGNOSIS — J449 Chronic obstructive pulmonary disease, unspecified: Secondary | ICD-10-CM | POA: Insufficient documentation

## 2022-08-22 DIAGNOSIS — Z13820 Encounter for screening for osteoporosis: Secondary | ICD-10-CM | POA: Insufficient documentation

## 2022-08-22 DIAGNOSIS — M5136 Other intervertebral disc degeneration, lumbar region: Secondary | ICD-10-CM | POA: Insufficient documentation

## 2022-08-22 DIAGNOSIS — M47812 Spondylosis without myelopathy or radiculopathy, cervical region: Secondary | ICD-10-CM | POA: Insufficient documentation

## 2022-08-22 DIAGNOSIS — E559 Vitamin D deficiency, unspecified: Secondary | ICD-10-CM | POA: Diagnosis not present

## 2022-08-22 DIAGNOSIS — M1712 Unilateral primary osteoarthritis, left knee: Secondary | ICD-10-CM | POA: Diagnosis not present

## 2022-08-22 DIAGNOSIS — Z79890 Hormone replacement therapy (postmenopausal): Secondary | ICD-10-CM | POA: Diagnosis not present

## 2022-08-22 DIAGNOSIS — M5030 Other cervical disc degeneration, unspecified cervical region: Secondary | ICD-10-CM | POA: Insufficient documentation

## 2022-09-03 DIAGNOSIS — M25561 Pain in right knee: Secondary | ICD-10-CM | POA: Diagnosis not present

## 2022-09-03 DIAGNOSIS — M25662 Stiffness of left knee, not elsewhere classified: Secondary | ICD-10-CM | POA: Diagnosis not present

## 2022-09-06 ENCOUNTER — Encounter: Payer: Self-pay | Admitting: Internal Medicine

## 2022-09-06 ENCOUNTER — Ambulatory Visit (INDEPENDENT_AMBULATORY_CARE_PROVIDER_SITE_OTHER): Payer: Medicare Other | Admitting: Internal Medicine

## 2022-09-06 VITALS — BP 120/70 | HR 65 | Temp 98.0°F | Wt 232.6 lb

## 2022-09-06 DIAGNOSIS — M79622 Pain in left upper arm: Secondary | ICD-10-CM

## 2022-09-06 NOTE — Progress Notes (Signed)
Established Patient Office Visit     CC/Reason for Visit: Left axillary chest wall pain  HPI: Allen [name] is a 72 y.o. male who is coming in today for the above mentioned reasons.  Since at least February (6 months) he has been experiencing pain and discomfort around his posterior left axillary line.  It is worse when pressing directly on that area, doing rowing type exercises at the gym or lifting his arm above his head.  He does not recall any particular injury or fall preceding this.   Past Medical/Surgical History: Past Medical History:  Diagnosis Date   Anxiety    Arthritis    neck -   BPH with obstruction/lower urinary tract symptoms    urologist-- dr pace   Cancer [facility])    basal cell to nose   Centrilobular emphysema (HCC)    followed by dr Craige Cotta   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followed by dr Craige Cotta   Cough 07/2020   covid residual, no congestion   DOE (dyspnea on exertion)    due to copd/ emphysema   ED (erectile dysfunction)    Essential tremor    neurologist--- dr tat----  bilateral upper extremities,  s/p DPS 12/ 2016   GERD (gastroesophageal reflux disease)    History of 2019 novel coronavirus disease (COVID-19) 07/18/2020   per pt postive home test 07-18-2020 w/ mild  symptoms per pt; pt stated had urgent care visit 07-24-2020 for sob/ cough with negative cxr (in care everywhere) ;  follow up pcp visit 08-03-2020 in epic   Insomnia    Mixed simple and mucopurulent chronic bronchitis (HCC)    OSA (obstructive sleep apnea)    followed by dr Craige Cotta---- no longer does uses bipap, intolerant --- pt prescriped  Oxygen  2 L oxygen via [place] while sleeping  (study in epic 04/ 2017 AHI 40)   Perennial allergic rhinitis    Pneumonia    Pulmonary nodules    followed by dr Craige Cotta   S/P deep brain stimulator placement 12/2014   followed by neurology-----IPG change 01-21-2020 for end-of life battery  (pt has a control)   Varicose vein of leg    per pt has had 3 procedure's last one  2006   Wears dentures    upper    Past Surgical History:  Procedure Laterality Date   ANKLE SURGERY Right 2020   tendon repair   APPENDECTOMY     CARPAL TUNNEL RELEASE Left 2019   CATARACT EXTRACTION W/ INTRAOCULAR LENS IMPLANT Bilateral 2020   COLONOSCOPY     CYSTOSCOPY WITH URETHRAL DILATATION N/A 08/29/2021   Procedure: CYSTOSCOPY WITH URETHRAL DILATATION;  Surgeon: Noel Christmas, MD;  Location: [facility];  Service: Urology;  Laterality: N/A;   DEEP BRAIN STIMULATOR PLACEMENT Left 2016   INGUINAL HERNIA REPAIR Right 2007   KNEE ARTHROSCOPY W/ MENISCECTOMY Right 06/25/2022   ROTATOR CUFF REPAIR Right 2014   SUBTHALAMIC STIMULATOR BATTERY REPLACEMENT N/A 01/21/2020   Procedure: Deep brain stimulator battery replacement;  Surgeon: Maeola Harman, MD;  Location: [facility] OR;  Service: Neurosurgery;  Laterality: N/A;   TONSILLECTOMY AND ADENOIDECTOMY     age 72   TOTAL SHOULDER REPLACEMENT Left 2016   TRANSURETHRAL RESECTION OF PROSTATE N/A 08/09/2020   Procedure: TRANSURETHRAL RESECTION OF THE PROSTATE (TURP), BLADDER BIOPSY AND FULGERATION;  Surgeon: Noel Christmas, MD;  Location: [facility];  Service: Urology;  Laterality: N/A;   TRANSURETHRAL RESECTION OF PROSTATE N/A 08/29/2021  Procedure: RE-TRANSURETHRAL RESECTION OF THE PROSTATE (TURP);  Surgeon: Noel Christmas, MD;  Location: [facility];  Service: Urology;  Laterality: N/A;   VEIN SURGERY Left 2006   varicose veins    Social History:  reports that he quit smoking about 33 years ago. His smoking use included cigarettes. He started smoking about 53 years ago. He has a 60 pack-year smoking history. He has never used smokeless tobacco. He reports current alcohol use of about 1.0 standard drink of alcohol per week. He reports that he does not use drugs.  Allergies: Allergies  Allergen Reactions   Copper-Containing Compounds     Copper gloves caused redness and blisters bilateral hands    Family History:   Family History  Problem Relation Age of Onset   COPD Mother    Asthma Father    Rectal cancer Sister    Healthy Child    Urticaria Neg Hx    Immunodeficiency Neg Hx    Eczema Neg Hx    Atopy Neg Hx    Angioedema Neg Hx    Allergic rhinitis Neg Hx    Colon cancer Neg Hx    Stomach cancer Neg Hx    Esophageal cancer Neg Hx      Current Outpatient Medications:    albuterol (VENTOLIN HFA) 108 (90 Base) MCG/ACT inhaler, Inhale 1-2 puffs into the lungs every 4 (four) hours as needed for wheezing or shortness of breath., Disp: , Rfl:    Azelastine-Fluticasone 137-50 MCG/ACT SUSP, Place 1 spray into the nose in the morning and at bedtime., Disp: 23 g, Rfl: 5   busPIRone (BUSPAR) 30 MG tablet, Take 30 mg by mouth at bedtime., Disp: , Rfl:    carbidopa-levodopa (SINEMET IR) 25-100 MG tablet, Take 1 tablet by mouth 3 (three) times daily. Titrating up takes 1/2 tablet 3 times daily, Disp: , Rfl:    Cholecalciferol (VITAMIN D3) 125 MCG (5000 UT) TABS, Take 5,000 Units by mouth in the morning., Disp: , Rfl:    Cyanocobalamin 2500 MCG SUBL, Place 5,000 mcg under the tongue daily., Disp: , Rfl:    escitalopram (LEXAPRO) 10 MG tablet, Take 10 mg by mouth daily., Disp: , Rfl:    Fluticasone-Umeclidin-Vilant (TRELEGY ELLIPTA) 100-62.5-25 MCG/ACT AEPB, Inhale 1 puff into the lungs daily., Disp: 180 each, Rfl: 3   HYDROcodone-acetaminophen (NORCO/VICODIN) 5-325 MG tablet, Take 1-2 tablets by mouth every 6 (six) hours as needed for moderate pain (for post surgery)., Disp: , Rfl:    Multiple Vitamins-Minerals (PRESERVISION AREDS) TABS, Take 1 tablet by mouth daily., Disp: , Rfl:    mupirocin ointment (BACTROBAN) 2 %, Apply 1 Application topically 3 (three) times daily as needed., Disp: , Rfl:    Omega-3 Fatty Acids (FISH OIL PO), Take 2,200 mg by mouth in the morning., Disp: , Rfl:    omeprazole (PRILOSEC) 40 MG capsule, Take 40 mg, 1 capsule, by mouth once to twice daily (Patient taking differently: Take  40 mg by mouth daily. Take 40 mg, 1 capsule, by mouth once to twice daily), Disp: 180 capsule, Rfl: 3   tadalafil (CIALIS) 20 MG tablet, Take 1 tablet (20 mg total) by mouth daily as needed for erectile dysfunction. (Patient taking differently: Take 10 mg by mouth daily.), Disp: 90 tablet, Rfl: 3   tamsulosin (FLOMAX) 0.4 MG CAPS capsule, Take 0.4 mg by mouth in the morning., Disp: , Rfl:    traZODone (DESYREL) 100 MG tablet, TAKE 1 TABLET AT BEDTIME, Disp: 90 tablet, Rfl: 1  triamcinolone (KENALOG) 0.025 % cream, Apply 1 Application topically 2 (two) times daily as needed., Disp: , Rfl:    TURMERIC PO, Take 2,250 mg by mouth in the morning., Disp: , Rfl:    zaleplon (SONATA) 10 MG capsule, Take 10 mg by mouth at bedtime., Disp: , Rfl:   Review of Systems:  Negative unless indicated in HPI.   Physical Exam: Vitals:   09/06/22 1016  BP: 120/70  Pulse: 65  Temp: 98 F (36.7 C)  TempSrc: Oral  SpO2: 98%  Weight: 232 lb 9.6 oz (105.5 kg)    Body mass index is 33.37 kg/m.   Physical Exam Musculoskeletal:     Comments: Pain to palpation in the left posterior axillary line corresponding to the dorsalis muscle      Impression and Plan:  Axillary pain, left  -Suspect musculoskeletal in nature.  See no need to order a chest x-ray.  Have advised as needed NSAIDs and gentle stretches/exercise.  Offered referral to therapy but he would like to hold off for now.   Time spent:23 minutes reviewing chart, interviewing and examining patient and formulating plan of care.     Chaya Jan, MD [facility]

## 2022-09-07 ENCOUNTER — Encounter: Payer: Self-pay | Admitting: Internal Medicine

## 2022-09-11 ENCOUNTER — Encounter: Payer: Self-pay | Admitting: *Deleted

## 2022-09-11 ENCOUNTER — Other Ambulatory Visit: Payer: Self-pay | Admitting: Internal Medicine

## 2022-09-11 DIAGNOSIS — E6609 Other obesity due to excess calories: Secondary | ICD-10-CM

## 2022-09-11 MED ORDER — WEGOVY 0.25 MG/0.5ML ~~LOC~~ SOAJ
0.2500 mg | SUBCUTANEOUS | 0 refills | Status: DC
Start: 2022-09-11 — End: 2023-08-02

## 2022-09-14 ENCOUNTER — Ambulatory Visit (HOSPITAL_BASED_OUTPATIENT_CLINIC_OR_DEPARTMENT_OTHER): Payer: Medicare Other | Admitting: Pulmonary Disease

## 2022-09-14 ENCOUNTER — Encounter (HOSPITAL_BASED_OUTPATIENT_CLINIC_OR_DEPARTMENT_OTHER): Payer: Self-pay | Admitting: Pulmonary Disease

## 2022-09-14 VITALS — BP 98/60 | HR 68 | Resp 16 | Ht 70.0 in | Wt 224.5 lb

## 2022-09-14 DIAGNOSIS — R918 Other nonspecific abnormal finding of lung field: Secondary | ICD-10-CM

## 2022-09-14 DIAGNOSIS — J4489 Other specified chronic obstructive pulmonary disease: Secondary | ICD-10-CM | POA: Diagnosis not present

## 2022-09-14 MED ORDER — TRELEGY ELLIPTA 100-62.5-25 MCG/ACT IN AEPB: 1.0000 | INHALATION_SPRAY | Freq: Every day | RESPIRATORY_TRACT | 3 refills | Status: DC

## 2022-09-14 NOTE — Progress Notes (Signed)
[facility]  Chief Complaint  Patient presents with   Follow-up    OSA-stopped cpap 11/2021. Doing great. Steps bother him a little    Constitutional:  BP 98/60   Pulse 68   Resp 16   Ht 5\' 10"  (1.778 m)   Wt 224 lb 8.6 oz (101.9 kg)   SpO2 98%   BMI 32.22 kg/m   Past Medical History:  Carpal tunnel, GERD, Tremor, ED, Cataracts, Obstructive sleep apnea intolerant of CPAP/Bipap, Anxiety, BPH, ED, COVID July 2022  Past Surgical History:  His  has a past surgical history that includes Inguinal hernia repair (Right, 2007); Cataract extraction w/ intraocular lens implant (Bilateral, 2020); Vein Surgery (Left, 2006); Rotator cuff repair (Right, 2014); Total shoulder replacement (Left, 2016); Deep brain stimulator placement (Left, 2016); Carpal tunnel release (Left, 2019); Tonsillectomy and adenoidectomy; Ankle surgery (Right, 2020); Colonoscopy; Subthalamic stimulator battery replacement (N/A, 01/21/2020); Transurethral resection of prostate (N/A, 08/09/2020); Appendectomy; Transurethral resection of prostate (N/A, 08/29/2021); Cystoscopy with urethral dilatation (N/A, 08/29/2021); and Knee arthroscopy w/ meniscectomy (Right, 06/25/2022).  Brief Summary:  Allen [name] is a 72 y.o. male former smoker with asthma, emphysema, and sleep related hypoxia.      Subjective:   He has been able to maintain his weight loss.  Sleeping okay.  CT chest in April 2024 showed mild emphysema and tiny lung nodules.  Not having cough, wheeze, or sputum.  Gets winded walking up stairs or a hill, and uses albuterol then.  Got an exercise bike recently.  Uses trelegy.  He is happy there was a price reduction.  He got his RSV vaccine last year.  Asked about COVID booster and flu shot.  Physical Exam:   Appearance - well kempt   ENMT - no sinus tenderness, no oral exudate, no LAN, Mallampati 3 airway, no stridor  Respiratory - equal breath sounds  bilaterally, no wheezing or rales  CV - s1s2 regular rate and rhythm, no murmurs  Ext - no clubbing, no edema  Skin - no rashes  Psych - normal mood and affect       Pulmonary testing:  PFT 12/21/16 >> FEV1 2.40 (69%), FEV1% 74, TLC 4.95 (73%), DLCO 77%, +BD IgE 04/10/19 >> 66 PFT 04/20/19 >> FEV1 2.09 (62%), FEV1% 74, TLC 5.33 (75%), DLCO 93%  Chest Imaging:  CT chest 05/04/19 >> centrilobular and paraseptal emphysema, 3 mm nodule RUL, 4 mm nodule RML, 3 mm nodule lingula CT chest 05/11/20 >> stable nodules up to 3 mm CT chest 04/24/22 >> apical emphysema, 2 mm nodule RUL, LLL scarring  Sleep Tests:  HST 04/20/15 >> AHI 40 Auto Bipap 05/21/19 to 06/01/19 >> used on 11 of 12 nights with average 6 hrs 50 min.  Average AHI 9.1 with median Bipap 11/7 and 95 th percentile Bipap 12/8 cm H2O. ONO with RA 11/17/19 >> test time 2 hrs 42 min.  Baseline SpO2 88%, low SpO2 82%.  Spent 1 hr 27 min with SpO2 < 88%. HST 04/26/21 >> AHI 8.1, SpO2 low 82%, spent 149 min with SpO2 < 89%  Cardiac Tests:  Echo 04/30/22 >> EF 50 to 55%, mild LVH, grade 1 DD, mod LA dilation  Social History:  He  reports that he quit smoking about 33 years ago. His smoking use included cigarettes. He started smoking about 53 years ago. He has a 60 pack-year smoking history. He has never used smokeless tobacco. He reports current alcohol use of about 1.0  standard drink of alcohol per week. He reports that he does not use drugs.  Family History:  His family history includes Asthma in his father; COPD in his mother; Healthy in his child; Rectal cancer in his sister.     Assessment/Plan:   COPD with emphysema and asthma. - breztri and symbicort were ineffective and too expensive - continue trelegy 100 one puff daily - prn albuterol - he keeps a script of zithromax and prednisone in case of flare up when he travels - discussed newest COVID booster and influenza vaccines; he will get these later this Fall  Lung  nodules. - decreased in size on CT chest from April 2024 - no additional radiographic follow up needed  Obstructive sleep apnea. - resolved after weight loss  Hoarseness with reflux. - continue prilosec - followed by [facility]  Perennial allergic rhinitis with nonallergic component. - followed by Dr. Malachi Bonds with [facility]   Tremor, Foot paresthesia. - followed by Dr. Lurena Joiner Tat with [facility]   Time Spent Involved in Patient Care on Day of Examination:  35 minutes  Follow up:   Patient Instructions  Follow up in 6 months  Medication List:   Allergies as of 09/14/2022       Reactions   Copper-containing Compounds    Copper gloves caused redness and blisters bilateral hands        Medication List        Accurate as of September 14, 2022  9:25 AM. If you have any questions, ask your nurse or doctor.          albuterol 108 (90 Base) MCG/ACT inhaler Commonly known as: VENTOLIN HFA Inhale 1-2 puffs into the lungs every 4 (four) hours as needed for wheezing or shortness of breath.   Azelastine-Fluticasone 137-50 MCG/ACT Susp Place 1 spray into the nose in the morning and at bedtime.   busPIRone 30 MG tablet Commonly known as: BUSPAR Take 30 mg by mouth at bedtime.   carbidopa-levodopa 25-100 MG tablet Commonly known as: SINEMET IR Take 1 tablet by mouth 3 (three) times daily. Titrating up takes 1/2 tablet 3 times daily   Cyanocobalamin 2500 MCG Subl Place 5,000 mcg under the tongue daily.   escitalopram 10 MG tablet Commonly known as: LEXAPRO Take 10 mg by mouth daily.   FISH OIL PO Take 2,200 mg by mouth in the morning.   HYDROcodone-acetaminophen 5-325 MG tablet Commonly known as: NORCO/VICODIN Take 1-2 tablets by mouth every 6 (six) hours as needed for moderate pain (for post surgery).   mupirocin ointment 2 % Commonly known as: BACTROBAN Apply 1 Application topically 3 (three) times daily as needed.    omeprazole 40 MG capsule Commonly known as: PRILOSEC Take 40 mg, 1 capsule, by mouth once to twice daily What changed:  how much to take how to take this when to take this   PreserVision AREDS Tabs Take 1 tablet by mouth daily.   tadalafil 20 MG tablet Commonly known as: CIALIS Take 1 tablet (20 mg total) by mouth daily as needed for erectile dysfunction. What changed:  how much to take when to take this   tamsulosin 0.4 MG Caps capsule Commonly known as: FLOMAX Take 0.4 mg by mouth in the morning.   traZODone 100 MG tablet Commonly known as: DESYREL TAKE 1 TABLET AT BEDTIME   Trelegy Ellipta 100-62.5-25 MCG/ACT Aepb Generic drug: Fluticasone-Umeclidin-Vilant Inhale 1 puff into the lungs daily.   triamcinolone 0.025 % cream Commonly  known as: KENALOG Apply 1 Application topically 2 (two) times daily as needed.   TURMERIC PO Take 2,250 mg by mouth in the morning.   Vitamin D3 125 MCG (5000 UT) Tabs Take 5,000 Units by mouth in the morning.   Wegovy 0.25 MG/0.5ML Soaj Generic drug: Semaglutide-Weight Management Inject 0.25 mg into the skin once a week.   zaleplon 10 MG capsule Commonly known as: SONATA Take 10 mg by mouth at bedtime.        Signature:  Coralyn Helling, MD [facility] Pulmonary/Critical Care Pager - [phone] 09/14/2022, 9:25 AM

## 2022-09-14 NOTE — Patient Instructions (Signed)
Follow up in 6 months 

## 2022-10-01 ENCOUNTER — Other Ambulatory Visit: Payer: Self-pay | Admitting: Internal Medicine

## 2022-10-01 DIAGNOSIS — M170 Bilateral primary osteoarthritis of knee: Secondary | ICD-10-CM | POA: Diagnosis not present

## 2022-10-05 NOTE — Progress Notes (Deleted)
Assessment/Plan:    1.  Essential Tremor  Patient had DBS surgery on the left brain in [place] in 2016.  Patient status post IPG change in January, 2022.    -he is c/o L hand tremor getting worse.  Discussed dbs to the R brain but he thinks he doesn't want to pursue due to age.  Not sure he would be focused ultrasound candidate given he would have to be in an MRI machine to do it.  His device is compatible but has to be only 1.5 T magnet and can only be in for short periods and not sure that this would allow for the procedure to be completed.  -increase primidone, 50 mg, 3in the AM, 1 in the pM    2.  COPD  -Following with Dr. Craige Cotta.  On meds which could contribute to tremors.  3.  Back and neck pain  -Following with Dr. Jake Samples and Dr. Lorrine Kin.    4.  Memory change, possible MCI  -Patient had neurocognitive testing scheduled in June, 2024 with Dr. Milbert Coulter and canceled it  -On B12 supplementation for mild B12 deficiency.  Subjective:   Monserrat [name] was seen today in follow up for essential tremor.  My previous records were reviewed prior to todays visit.  Patient previously was worried about memory and had neurocognitive testing with Dr. Milbert Coulter scheduled in June, but ended up canceling it.  Current prescribed movement disorder medications: Primidone, 50 mg, 3 tablets in the morning and 1 tablet at night (increased)    PREVIOUS MEDICATIONS:  primidone (pt wanted to restart but felt caused ED at low dose), propranolol, gabapentin; not beta-blocker candidate now because of asthma  ALLERGIES:   Allergies  Allergen Reactions   Copper-Containing Compounds     Copper gloves caused redness and blisters bilateral hands    CURRENT MEDICATIONS:  Outpatient Encounter Medications as of 10/08/2022  Medication Sig   albuterol (VENTOLIN HFA) 108 (90 Base) MCG/ACT inhaler Inhale 1-2 puffs into the lungs every 4 (four) hours as needed for wheezing or shortness of breath.    Azelastine-Fluticasone 137-50 MCG/ACT SUSP Place 1 spray into the nose in the morning and at bedtime.   busPIRone (BUSPAR) 30 MG tablet Take 30 mg by mouth at bedtime.   carbidopa-levodopa (SINEMET IR) 25-100 MG tablet Take 1 tablet by mouth 3 (three) times daily. Titrating up takes 1/2 tablet 3 times daily   Cholecalciferol (VITAMIN D3) 125 MCG (5000 UT) TABS Take 5,000 Units by mouth in the morning.   Cyanocobalamin 2500 MCG SUBL Place 5,000 mcg under the tongue daily.   escitalopram (LEXAPRO) 10 MG tablet Take 10 mg by mouth daily.   Fluticasone-Umeclidin-Vilant (TRELEGY ELLIPTA) 100-62.5-25 MCG/ACT AEPB Inhale 1 puff into the lungs daily.   HYDROcodone-acetaminophen (NORCO/VICODIN) 5-325 MG tablet Take 1-2 tablets by mouth every 6 (six) hours as needed for moderate pain (for post surgery).   montelukast (SINGULAIR) 10 MG tablet TAKE 1 TABLET BY MOUTH AT  BEDTIME   Multiple Vitamins-Minerals (PRESERVISION AREDS) TABS Take 1 tablet by mouth daily.   mupirocin ointment (BACTROBAN) 2 % Apply 1 Application topically 3 (three) times daily as needed.   Omega-3 Fatty Acids (FISH OIL PO) Take 2,200 mg by mouth in the morning.   omeprazole (PRILOSEC) 40 MG capsule Take 40 mg, 1 capsule, by mouth once to twice daily (Patient taking differently: Take 40 mg by mouth daily. Take 40 mg, 1 capsule, by mouth once to twice daily)   Semaglutide-Weight Management (  WEGOVY) 0.25 MG/0.5ML SOAJ Inject 0.25 mg into the skin once a week.   tadalafil (CIALIS) 20 MG tablet Take 1 tablet (20 mg total) by mouth daily as needed for erectile dysfunction. (Patient taking differently: Take 10 mg by mouth daily.)   tamsulosin (FLOMAX) 0.4 MG CAPS capsule Take 0.4 mg by mouth in the morning.   traZODone (DESYREL) 100 MG tablet TAKE 1 TABLET AT BEDTIME   triamcinolone (KENALOG) 0.025 % cream Apply 1 Application topically 2 (two) times daily as needed.   TURMERIC PO Take 2,250 mg by mouth in the morning.   zaleplon (SONATA) 10 MG  capsule Take 10 mg by mouth at bedtime.   No facility-administered encounter medications on file as of 10/08/2022.     Objective:    PHYSICAL EXAMINATION:    VITALS:   There were no vitals filed for this visit.   Wt Readings from Last 3 Encounters:  09/14/22 224 lb 8.6 oz (101.9 kg)  09/06/22 232 lb 9.6 oz (105.5 kg)  08/01/22 209 lb (94.8 kg)    GEN:  The patient appears stated age and is in NAD. HEENT:  Normocephalic, atraumatic.  The mucous membranes are moist. The superficial temporal arteries are without ropiness or tenderness.   Neurological examination:  Orientation: The patient is alert and oriented x3. Cranial nerves: There is good facial symmetry. The speech is fluent and clear. Soft palate rises symmetrically and there is no tongue deviation. Hearing is intact to conversational tone. Sensation: Sensation is intact to light touch throughout Motor: Strength is at least antigravity x4.  Movement examination: Tone: There is normal tone in the UE/LE Abnormal movements: The right hand has minimal tremor.  The left hand does have mild to moderate tremor, worse with holding objects.  I have reviewed and interpreted the following labs independently   Chemistry      Component Value Date/Time   NA 141 07/25/2022 1015   K 4.4 07/25/2022 1015   CL 103 07/25/2022 1015   CO2 30 07/25/2022 1015   BUN 25 (H) 07/25/2022 1015   CREATININE 1.02 07/25/2022 1015      Component Value Date/Time   CALCIUM 9.4 07/25/2022 1015   ALKPHOS 53 07/25/2022 1015   AST 17 07/25/2022 1015   ALT 16 07/25/2022 1015   BILITOT 0.6 07/25/2022 1015      Lab Results  Component Value Date   WBC 7.1 07/25/2022   HGB 12.8 (L) 07/25/2022   HCT 39.6 07/25/2022   MCV 99.5 07/25/2022   PLT 315.0 07/25/2022   Lab Results  Component Value Date   TSH 1.78 07/25/2022     Chemistry      Component Value Date/Time   NA 141 07/25/2022 1015   K 4.4 07/25/2022 1015   CL 103 07/25/2022 1015    CO2 30 07/25/2022 1015   BUN 25 (H) 07/25/2022 1015   CREATININE 1.02 07/25/2022 1015      Component Value Date/Time   CALCIUM 9.4 07/25/2022 1015   ALKPHOS 53 07/25/2022 1015   AST 17 07/25/2022 1015   ALT 16 07/25/2022 1015   BILITOT 0.6 07/25/2022 1015         Total time spent on today's visit was *** minutes, including both face-to-face time and nonface-to-face time.  Time included that spent on review of records (prior notes available to me/labs/imaging if pertinent), discussing treatment and goals, answering patient's questions and coordinating care.  This was separate from the DBS time.  Cc:  Ardyth Harps  Priscella Mann, MD

## 2022-10-08 ENCOUNTER — Encounter: Payer: Self-pay | Admitting: Neurology

## 2022-10-08 ENCOUNTER — Encounter: Payer: Medicare Other | Admitting: Neurology

## 2022-10-09 NOTE — Progress Notes (Unsigned)
HPI :  72 year old male here for a follow-up visit to discuss recent results from his colonoscopy.  He is accompanied by his wife today.  I performed surveillance colonoscopy for him in July for history of colon polyps.  He was noted to have 13 polyps in his colon, all small, most were adenomatous/sessile serrated.  I recommend a repeat colonoscopy in 3 years.  Incidentally he had some very mild inflammation in his distal rectum consistent with mild proctitis.  Biopsies showed chronic active inflammation.  He is here to discuss that result today.  He states he has occasional NSAID use but nothing significant or routine.  He denies any symptoms of infection prior to colonoscopy.  States his bowel habits are pretty regular.  He does not have any urgency or tenesmus that he endorses.  Those his bowels regularly, no diarrhea.  No rectal bleeding.  He does have a history of hemorrhoids but that does not bother him.  His sister did have a rectal cancer in her 102s, he also has a daughter who has Crohn's disease and her age 77s.  His most recent colonoscopy prior to our exam was in 2019 in [place] where he had a few small polyps removed but no active inflammation.  He does endorse being told he had "colitis" on a prior remote colonoscopy 1 done in [place] several years ago.  He does not have the report nor does he think he could get access to it.  He denies ever receiving chronic therapy for colitis in the past     Echocardiogram 05/29/19: - EF 60-65%, mild LVH, grade I DD   Colonoscopy 06/21/2017 - done in [place] - 2 diminutive polyps removed hepatic flexure, another diminutive polyp sigmoid colon. Told to repeat in 5 years. Normal mucosa right colon, hyperplastic polyp left colon  EGD 11/16/21: - Esophagogastric landmarks were identified: the Z-line was found at 42 cm, the gastroesophageal junction was found at 42 cm and the upper extent of the gastric folds was found at 42 cm from the incisors.  Findings: - The exam of the esophagus was otherwise normal. No erosive or inflammatory changes. - Patchy inflammation characterized by erythema, friability and shallow ulcerations (mid gastric body) were found in the gastric body and in the gastric antrum. Biopsies were taken with a cold forceps for Helicobacter pylori testing. - The exam of the stomach was otherwise normal. - The ampulla and examined duodenum were normal.  Surgical [P], antrum body bx's - REACTIVE GASTROPATHY. - NEGATIVE FOR AN INFLAMMATORY PATTERN PREDICTIVE OF HELICOBACTER PYLORI INFECTION. - NEGATIVE FOR INTESTINAL METAPLASIA AND MALIGNANCY.   Colonoscopy 08/01/22: - Six 2 to 4 mm polyps in the cecum, removed with a cold snare. Resected and retrieved. - Four 3 to 4 mm polyps in the ascending colon, removed with a cold snare. Resected and retrieved. - One 5 mm polyp in the transverse colon, removed with a cold snare. Resected and retrieved. - Two 3 to 4 mm polyps in the sigmoid colon, removed with a cold snare. Resected and retrieved. - Diverticulosis in the sigmoid colon and in the ascending colon. - Localized mild inflammation was found in the distal rectum. Biopsied. - Internal hemorrhoids. - The examination was otherwise normal.  1. Surgical [P], colon, cecum, ascending, polyp (10) - TUBULAR ADENOMA WITHOUT HIGH-GRADE DYSPLASIA OR MALIGNANCY - DIMINUTIVE HYPERPLASTIC POLYP(S) 2. Surgical [P], colon, transverse, sigmoid, polyp (3) - TUBULAR ADENOMA(S) WITHOUT HIGH-GRADE DYSPLASIA OR MALIGNANCY - SESSILE SERRATED POLYP WITHOUT CYTOLOGIC DYSPLASIA 3.  Surgical [P], colon, rectum - MILDLY ACTIVE CHRONIC PROCTITIS, SEE NOTE - NEGATIVE FOR GRANULOMAS OR DYSPLASIA  3. The rectal biopsies show prominent lamina propria lymphoplasmacytosis, mild architectural distortion and cryptitis. Differential diagnosis includes idiopathic inflammatory bowel disease, infection, drug effect and less likely ischemia. Clinical correlation is  suggested.   Past Medical History:  Diagnosis Date   Anxiety    Arthritis    neck -   BPH with obstruction/lower urinary tract symptoms    urologist-- dr pace   Cancer [facility])    basal cell to nose   Centrilobular emphysema (HCC)    followed by dr Craige Cotta   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followed by dr Craige Cotta   Cough 07/2020   covid residual, no congestion   DOE (dyspnea on exertion)    due to copd/ emphysema   ED (erectile dysfunction)    Essential tremor    neurologist--- dr tat----  bilateral upper extremities,  s/p DPS 12/ 2016   GERD (gastroesophageal reflux disease)    History of 2019 novel coronavirus disease (COVID-19) 07/18/2020   per pt postive home test 07-18-2020 w/ mild  symptoms per pt; pt stated had urgent care visit 07-24-2020 for sob/ cough with negative cxr (in care everywhere) ;  follow up pcp visit 08-03-2020 in epic   Insomnia    Mixed simple and mucopurulent chronic bronchitis (HCC)    OSA (obstructive sleep apnea)    followed by dr Craige Cotta---- no longer does uses bipap, intolerant --- pt prescriped  Oxygen  2 L oxygen via [place] while sleeping  (study in epic 04/ 2017 [facility] 40)   Perennial allergic rhinitis    Pneumonia    Pulmonary nodules    followed by dr Craige Cotta   S/P deep brain stimulator placement 12/2014   followed by neurology-----IPG change 01-21-2020 for end-of life battery  (pt has a control)   Varicose vein of leg    per pt has had 3 procedure's last one 2006   Wears dentures    upper     Past Surgical History:  Procedure Laterality Date   ANKLE SURGERY Right 2020   tendon repair   APPENDECTOMY     CARPAL TUNNEL RELEASE Left 2019   CATARACT EXTRACTION W/ INTRAOCULAR LENS IMPLANT Bilateral 2020   COLONOSCOPY     CYSTOSCOPY WITH URETHRAL DILATATION N/A 08/29/2021   Procedure: CYSTOSCOPY WITH URETHRAL DILATATION;  Surgeon: Noel Christmas, MD;  Location: [facility];  Service: Urology;  Laterality: N/A;   DEEP BRAIN STIMULATOR PLACEMENT Left 2016   INGUINAL HERNIA  REPAIR Right 2007   KNEE ARTHROSCOPY W/ MENISCECTOMY Right 06/25/2022   ROTATOR CUFF REPAIR Right 2014   SUBTHALAMIC STIMULATOR BATTERY REPLACEMENT N/A 01/21/2020   Procedure: Deep brain stimulator battery replacement;  Surgeon: Maeola Harman, MD;  Location: [facility] OR;  Service: Neurosurgery;  Laterality: N/A;   TONSILLECTOMY AND ADENOIDECTOMY     age 83   TOTAL SHOULDER REPLACEMENT Left 2016   TRANSURETHRAL RESECTION OF PROSTATE N/A 08/09/2020   Procedure: TRANSURETHRAL RESECTION OF THE PROSTATE (TURP), BLADDER BIOPSY AND FULGERATION;  Surgeon: Noel Christmas, MD;  Location: [facility];  Service: Urology;  Laterality: N/A;   TRANSURETHRAL RESECTION OF PROSTATE N/A 08/29/2021   Procedure: RE-TRANSURETHRAL RESECTION OF THE PROSTATE (TURP);  Surgeon: Noel Christmas, MD;  Location: [facility];  Service: Urology;  Laterality: N/A;   VEIN SURGERY Left 2006   varicose veins   Family History  Problem Relation Age of Onset  COPD Mother    Asthma Father    Rectal cancer Sister    Healthy Child    Urticaria Neg Hx    Immunodeficiency Neg Hx    Eczema Neg Hx    Atopy Neg Hx    Angioedema Neg Hx    Allergic rhinitis Neg Hx    Colon cancer Neg Hx    Stomach cancer Neg Hx    Esophageal cancer Neg Hx    Social History   Tobacco Use   Smoking status: Former    Current packs/day: 0.00    Average packs/day: 3.0 packs/day for 20.0 years (60.0 ttl pk-yrs)    Types: Cigarettes    Start date: 67    Quit date: 55    Years since quitting: 33.7   Smokeless tobacco: Never  Vaping Use   Vaping status: Never Used  Substance Use Topics   Alcohol use: Yes    Alcohol/week: 1.0 standard drink of alcohol    Types: 1 Glasses of wine per week    Comment: occassionally   Drug use: Never   Current Outpatient Medications  Medication Sig Dispense Refill   albuterol (VENTOLIN HFA) 108 (90 Base) MCG/ACT inhaler Inhale 1-2 puffs into the lungs every 4 (four) hours as needed for wheezing  or shortness of breath.     Azelastine-Fluticasone 137-50 MCG/ACT SUSP Place 1 spray into the nose in the morning and at bedtime. 23 g 5   carbidopa-levodopa (SINEMET IR) 25-100 MG tablet Take 1 tablet by mouth 3 (three) times daily. Titrating up takes 1/2 tablet 3 times daily     Cholecalciferol (VITAMIN D3) 125 MCG (5000 UT) TABS Take 5,000 Units by mouth in the morning.     Cyanocobalamin 2500 MCG SUBL Place 5,000 mcg under the tongue daily.     escitalopram (LEXAPRO) 10 MG tablet Take 10 mg by mouth daily.     Fluticasone-Umeclidin-Vilant (TRELEGY ELLIPTA) 100-62.5-25 MCG/ACT AEPB Inhale 1 puff into the lungs daily. 180 each 3   Multiple Vitamins-Minerals (PRESERVISION AREDS) TABS Take 1 tablet by mouth daily.     mupirocin ointment (BACTROBAN) 2 % Apply 1 Application topically 3 (three) times daily as needed.     Omega-3 Fatty Acids (FISH OIL PO) Take 2,200 mg by mouth in the morning.     omeprazole (PRILOSEC) 40 MG capsule Take 40 mg, 1 capsule, by mouth once to twice daily (Patient taking differently: Take 40 mg by mouth daily. Take 40 mg, 1 capsule, by mouth once to twice daily) 180 capsule 3   Semaglutide-Weight Management (WEGOVY) 0.25 MG/0.5ML SOAJ Inject 0.25 mg into the skin once a week. 2 mL 0   tadalafil (CIALIS) 20 MG tablet Take 1 tablet (20 mg total) by mouth daily as needed for erectile dysfunction. (Patient taking differently: Take 10 mg by mouth daily.) 90 tablet 3   tamsulosin (FLOMAX) 0.4 MG CAPS capsule Take 0.4 mg by mouth in the morning.     traZODone (DESYREL) 100 MG tablet TAKE 1 TABLET AT BEDTIME 90 tablet 1   triamcinolone (KENALOG) 0.025 % cream Apply 1 Application topically 2 (two) times daily as needed.     TURMERIC PO Take 2,250 mg by mouth in the morning.     zaleplon (SONATA) 10 MG capsule Take 10 mg by mouth at bedtime.     busPIRone (BUSPAR) 30 MG tablet Take 30 mg by mouth at bedtime. (Patient not taking: Reported on 10/10/2022)     HYDROcodone-acetaminophen  (NORCO/VICODIN) 5-325 MG tablet  Take 1-2 tablets by mouth every 6 (six) hours as needed for moderate pain (for post surgery). (Patient not taking: Reported on 10/10/2022)     montelukast (SINGULAIR) 10 MG tablet TAKE 1 TABLET BY MOUTH AT  BEDTIME (Patient not taking: Reported on 10/10/2022) 100 tablet 1   No current facility-administered medications for this visit.   Allergies  Allergen Reactions   Copper-Containing Compounds     Copper gloves caused redness and blisters bilateral hands     Review of Systems: All systems reviewed and negative except where noted in HPI.   Lab Results  Component Value Date   WBC 7.1 07/25/2022   HGB 12.8 (L) 07/25/2022   HCT 39.6 07/25/2022   MCV 99.5 07/25/2022   PLT 315.0 07/25/2022    Lab Results  Component Value Date   ALT 16 07/25/2022   AST 17 07/25/2022   ALKPHOS 53 07/25/2022   BILITOT 0.6 07/25/2022    Lab Results  Component Value Date   NA 141 07/25/2022   CL 103 07/25/2022   K 4.4 07/25/2022   CO2 30 07/25/2022   BUN 25 (H) 07/25/2022   CREATININE 1.02 07/25/2022   GFR 73.68 07/25/2022   CALCIUM 9.4 07/25/2022   ALBUMIN 4.2 07/25/2022   GLUCOSE 100 (H) 07/25/2022     Physical Exam: BP 118/68   Pulse 78   Ht 5\' 10"  (1.778 m)   Wt 219 lb (99.3 kg)   BMI 31.42 kg/m  Constitutional: Pleasant,well-developed, male in no acute distress. Neurological: Alert and oriented to person place and time. Psychiatric: Normal mood and affect. Behavior is normal.   ASSESSMENT: 72 y.o. male here for assessment of the following  1. 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 History of colon polyps    Mild proctitis noted incidentally on his last colonoscopy.  In addition he had numerous precancerous small polyps in his colon.  Reviewed his colonoscopy findings with him.  He does not have any bowel symptoms that bother him at all.  Had not been using any NSAIDs.  He does endorse a remote colonoscopy which apparently showed  some colitis as well although he was never treated for it.  Based on pathology results, I am suspicious he likely does have mild ulcerative proctitis.  This is a very short area of involvement of his distal rectum and currently is not causing any symptoms.  We discussed that this can often cause tenesmus/urgency/bowel dysfunction but in the absence of any symptoms I think okay to monitor for now.  We discussed uncontrolled colitis could theoretically increase his risk for colon cancer over time however given such a small area of involvement, risk for colon cancer would be likely very low.  He will monitor for symptoms and if they occur he will contact me.  If he does experience symptoms I would recommend Canasa suppositories to use as needed.  Otherwise, recommend colonoscopy 3 years from his last exam, can consider it sooner should he have bowel problems that fails to respond to therapy.  He agrees with the plan, all questions answered.  PLAN: - discussed UC which is the likely dx - asymptomatic, discussed options - will monitor for now given no symptoms. He will contact me if symptoms occur, low threshold to use Canasa - repeat colonoscopy 3 years from his last exam - office visit in 1 year if not sooner with issues   Harlin Rain, MD [facility]

## 2022-10-10 ENCOUNTER — Ambulatory Visit: Payer: Medicare Other | Admitting: Gastroenterology

## 2022-10-10 ENCOUNTER — Encounter: Payer: Self-pay | Admitting: Gastroenterology

## 2022-10-10 VITALS — BP 118/68 | HR 78 | Ht 70.0 in | Wt 219.0 lb

## 2022-10-10 DIAGNOSIS — K5120 Ulcerative (chronic) proctitis without complications: Secondary | ICD-10-CM | POA: Diagnosis not present

## 2022-10-10 DIAGNOSIS — Z86010 Personal history of colonic polyps: Secondary | ICD-10-CM | POA: Diagnosis not present

## 2022-10-10 NOTE — Patient Instructions (Addendum)
  Please follow up in 1 year.  Thank you for entrusting me with your care and for choosing [facility], Dr. Ileene Patrick  If your blood pressure at your visit was 140/90 or greater, please contact your primary care physician to follow up on this. ______________________________________________________  If you are age 72 or older, your body mass index should be between 23-30. Your Body mass index is 31.42 kg/m. If this is out of the aforementioned range listed, please consider follow up with your Primary Care Provider.  If you are age 38 or younger, your body mass index should be between 19-25. Your Body mass index is 31.42 kg/m. If this is out of the aformentioned range listed, please consider follow up with your Primary Care Provider.  ________________________________________________________  The [place] [facility] providers would like to encourage you to use [facility] to communicate with providers for non-urgent requests or questions.  Due to long hold times on the telephone, sending your provider a message by [address] [facility] may be a faster and more efficient way to get a response.  Please allow 48 business hours for a response.  Please remember that this is for non-urgent requests.  _______________________________________________________  Due to recent changes in healthcare laws, you may see the results of your imaging and laboratory studies on MyChart before your provider has had a chance to review them.  We understand that in some cases there may be results that are confusing or concerning to you. Not all laboratory results come back in the same time frame and the provider may be waiting for multiple results in order to interpret others.  Please give [place] 48 hours in order for your provider to thoroughly review all the results before contacting the office for clarification of your results.

## 2022-10-17 ENCOUNTER — Telehealth: Payer: Self-pay | Admitting: Neurology

## 2022-10-17 NOTE — Telephone Encounter (Signed)
Patient dismissed from the care of Dr. Lurena Joiner Tat. This is based on patients' decision to transfer care to another provider. 10/08/22

## 2022-10-17 NOTE — Telephone Encounter (Signed)
Patient dismissed from Dr. Lurena Joiner Tat's care due to patients' decision to transfer care to another provider. 10/08/22

## 2022-10-22 NOTE — Progress Notes (Unsigned)
   Rubin Payor, PhD, LAT, ATC acting as a scribe for Clementeen Graham, MD.  Allen [name] is a 72 y.o. male who presents to [facility] at [facility] today for exacerbation of his L knee pain. Pt was previously seen by Dr. Katrinka Blazing on 04/18/22 and his L knee was aspirated and injected. He is scheduled for knee surgery on June 21st.   Today, pt reports ***  Dx testing: 08/22/22 DEXA scan  04/18/22 L knee XR   Pertinent review of systems: ***  Relevant historical information: ***   Exam:  There were no vitals taken for this visit. General: Well Developed, well nourished, and in no acute distress.   MSK: ***    Lab and Radiology Results No results found for this or any previous visit (from the past 72 hour(s)). No results found.     Assessment and Plan: 72 y.o. male with ***   PDMP not reviewed this encounter. No orders of the defined types were placed in this encounter.  No orders of the defined types were placed in this encounter.    Discussed warning signs or symptoms. Please see discharge instructions. Patient expresses understanding.   ***

## 2022-10-23 ENCOUNTER — Ambulatory Visit: Payer: Medicare Other | Admitting: Family Medicine

## 2022-10-23 ENCOUNTER — Encounter: Payer: Self-pay | Admitting: Family Medicine

## 2022-10-23 ENCOUNTER — Encounter: Payer: Self-pay | Admitting: Podiatry

## 2022-10-23 ENCOUNTER — Ambulatory Visit: Payer: Medicare Other | Admitting: Podiatry

## 2022-10-23 ENCOUNTER — Ambulatory Visit: Payer: Self-pay

## 2022-10-23 VITALS — BP 118/82 | HR 85 | Ht 70.0 in | Wt 219.0 lb

## 2022-10-23 DIAGNOSIS — B351 Tinea unguium: Secondary | ICD-10-CM

## 2022-10-23 DIAGNOSIS — M79675 Pain in left toe(s): Secondary | ICD-10-CM | POA: Diagnosis not present

## 2022-10-23 DIAGNOSIS — M1712 Unilateral primary osteoarthritis, left knee: Secondary | ICD-10-CM

## 2022-10-23 DIAGNOSIS — M79674 Pain in right toe(s): Secondary | ICD-10-CM | POA: Diagnosis not present

## 2022-10-23 DIAGNOSIS — G8929 Other chronic pain: Secondary | ICD-10-CM

## 2022-10-23 DIAGNOSIS — M25562 Pain in left knee: Secondary | ICD-10-CM

## 2022-10-23 NOTE — Patient Instructions (Addendum)
Thank you for coming in today.   You received an injection today. Seek immediate medical attention if the joint becomes red, extremely painful, or is oozing fluid.   Have fun on your trip!

## 2022-10-23 NOTE — Progress Notes (Signed)
This patient returns to the office for evaluation and treatment of long thick painful nails .  This patient is unable to trim his own nails since the patient cannot reach his feet.  Patient says the nails are painful walking and wearing his shoes.  He returns for preventive foot care services.  General Appearance  Alert, conversant and in no acute stress.  Vascular  Dorsalis pedis and posterior tibial  pulses are palpable  bilaterally.  Capillary return is within normal limits  bilaterally. Temperature is within normal limits  bilaterally.  Neurologic  Senn-Weinstein monofilament wire test within normal limits  bilaterally. Muscle power within normal limits bilaterally.  Nails Thick disfigured discolored nails with subungual debris  from hallux to fifth toes bilaterally.  Hallux nails are especially thickened and split and painful   No evidence of bacterial infection or drainage bilaterally.  Orthopedic  No limitations of motion  feet .  No crepitus or effusions noted.  No bony pathology or digital deformities noted.  Skin  normotropic skin with no porokeratosis noted bilaterally.  No signs of infections or ulcers noted.     Onychomycosis  Pain in toes right foot  Pain in toes left foot  Debridement  of nails  1-5  B/L with a nail nipper.  Nails were then filed using a dremel tool with no incidents.   Cauterized left great toenail.  RTC 4  months    Helane Gunther DPM

## 2022-10-25 ENCOUNTER — Telehealth: Payer: Self-pay

## 2022-10-25 NOTE — Telephone Encounter (Signed)
-----   Message from Clementeen Graham sent at 10/23/2022  8:48 AM EDT ----- Regarding: Allen [name] and single dose gel shot Please auth Zilretta and single dose gel shot left knee. He had a cortisone shot today and will be leaving for [place] Nov 10. I am want to have a backup plan ready to go if this shot does not last.

## 2022-10-29 NOTE — Telephone Encounter (Signed)
VOB initiated for Zilretta for LEFT knee OA

## 2022-10-30 ENCOUNTER — Encounter: Payer: Self-pay | Admitting: Internal Medicine

## 2022-10-30 DIAGNOSIS — J302 Other seasonal allergic rhinitis: Secondary | ICD-10-CM

## 2022-10-30 MED ORDER — AZELASTINE-FLUTICASONE 137-50 MCG/ACT NA SUSP: 1.0000 | Freq: Two times a day (BID) | NASAL | 5 refills | Status: AC

## 2022-10-30 NOTE — Telephone Encounter (Signed)
Prior Auth for [facility] for LEFT knee OA  APPROVED PA# [id number] Valid: 10/30/22-10/30/23

## 2022-10-30 NOTE — Telephone Encounter (Signed)
No prior authorization, medical notes or referrals needed. Patient has a [employer] HMO/POS plan with an effective date of 01/15/2022. Plan follows Medicare guidelines. Patient responsibility for [phone] Allen [name]) will be 20% with the remaining covered at 80% by the payer at the contracted rate. Patient is responsible for a $20 copay for CPT code 60454 with the remaining covered at 100% by the payer at contracted rate. Deductibles do not apply to these services. Patient has a $20 copay whether or not an office visit is billed. Only one copay applies per date of service. Patient has an out of pocket maximum of $3600 and has accumulated $1321.09. If out of pocket is met, coverage goes to 100% and copays will no longer apply. Patient has a calendar year policy starting from January 15, 2022 to January 15, 2023.

## 2022-10-30 NOTE — Telephone Encounter (Signed)
Zilretta for LEFT knee OA  Also checking Durolane  Primary Insurance: [employer] Medicare Adv HMO-POS Co-pay: $20 Co-insurance: 20% Deductible: does not apply Prior Auth: NOT required   Knee Injection History 04/18/22 - Kenalog LEFT 10/23/22 - Kenalog LEFT

## 2022-10-30 NOTE — Telephone Encounter (Signed)
VOB initiated for Durolane for LEFT knee OA

## 2022-10-30 NOTE — Telephone Encounter (Signed)
DUROLANE for LEFT knee OA   Primary Insurance: [facility] Medicare Co-pay: $20 Co-insurance: 20% Deductible: does not apply Prior Auth: APPROVED PA# [id number] Valid: 10/30/22-10/30/23    Knee Injection History 04/18/22 - Kenalog LEFT

## 2022-10-31 NOTE — Telephone Encounter (Signed)
Left message for patient to call back to schedule. Will need to allow time for scheduling.

## 2022-11-05 ENCOUNTER — Ambulatory Visit: Payer: Medicare Other | Admitting: Family Medicine

## 2022-11-07 MED ORDER — ALBUTEROL SULFATE HFA 108 (90 BASE) MCG/ACT IN AERS: 1.0000 | INHALATION_SPRAY | RESPIRATORY_TRACT | 2 refills | Status: DC | PRN

## 2022-11-07 NOTE — Telephone Encounter (Signed)
Okay to refill? 

## 2022-11-07 NOTE — Addendum Note (Signed)
Addended by: Kern Reap B on: 11/07/2022 10:49 AM   Modules accepted: Orders

## 2022-11-19 ENCOUNTER — Other Ambulatory Visit: Payer: Self-pay

## 2022-11-19 ENCOUNTER — Ambulatory Visit: Payer: Medicare Other | Admitting: Family Medicine

## 2022-11-19 VITALS — BP 118/82 | HR 85 | Ht 70.0 in | Wt 215.0 lb

## 2022-11-19 DIAGNOSIS — M1712 Unilateral primary osteoarthritis, left knee: Secondary | ICD-10-CM

## 2022-11-19 DIAGNOSIS — G8929 Other chronic pain: Secondary | ICD-10-CM

## 2022-11-19 DIAGNOSIS — M25562 Pain in left knee: Secondary | ICD-10-CM

## 2022-11-19 MED ORDER — TRIAMCINOLONE ACETONIDE 32 MG IX SRER
32.0000 mg | Freq: Once | INTRA_ARTICULAR | Status: AC
Start: 2022-11-19 — End: 2022-11-19
  Administered 2022-11-19: 32 mg via INTRA_ARTICULAR

## 2022-11-19 NOTE — Progress Notes (Signed)
   Rubin Payor, PhD, LAT, ATC acting as a scribe for Clementeen Graham, MD.  Allen [name] is a 72 y.o. male who presents to [facility] at [facility] today for f/u L knee pain and possible injection. Pt was last seen by Dr. Denyse Amass on 10/23/22 and was given a L knee steroid injection.  Today, pt reports L knee pain continues. He is unsure which shot would be best. He is scheduled for a surgical consultation in Dec, but is thinking a TKR wouldn't be until early 2025.  He is seeing Dr. Luiz Blare.  Dx testing: 08/22/22 DEXA scan             04/18/22 L knee XR  Pertinent review of systems: No fevers or chills  Relevant historical information: Heart failure and ulcerative colitis   Exam:  BP 118/82   Pulse 85   Ht 5\' 10"  (1.778 m)   Wt 215 lb (97.5 kg)   SpO2 99%   BMI 30.85 kg/m  General: Well Developed, well nourished, and in no acute distress.   MSK: Left knee minimal effusion normal motion with crepitation.    Lab and Radiology Results   Zilretta injection left knee Procedure: Real-time Ultrasound Guided Injection of left knee joint superior lateral patellar space Device: Philips Affiniti 50G Images permanently stored and available for review in PACS Verbal informed consent obtained.  Discussed risks and benefits of procedure. Warned about infection, hyperglycemia bleeding, damage to structures among others. Patient expresses understanding and agreement Time-out conducted.   Noted no overlying erythema, induration, or other signs of local infection.   Skin prepped in a sterile fashion.   Local anesthesia: Topical Ethyl chloride.   With sterile technique and under real time ultrasound guidance: Zilretta 32 mg injected into knee joint. Fluid seen entering the joint capsule.   Completed without difficulty   Advised to call if fevers/chills, erythema, induration, drainage, or persistent bleeding.   Images permanently stored and available for review in the ultrasound  unit.  Impression: Technically successful ultrasound guided injection.  Lot number: [id number]      Assessment and Plan: 72 y.o. male with left knee pain due to exacerbation of underlying DJD.  Plan for Zilretta injection today.  He is scheduled to see orthopedic surgery in mid to late December for evaluation and consultation for total knee replacement.  He could have that surgery 3 months after today's injection which would be early February.   PDMP not reviewed this encounter. Orders Placed This Encounter  Procedures   [place] LIMITED JOINT SPACE STRUCTURES LOW LEFT(NO LINKED CHARGES)    Order Specific Question:   Reason for Exam (SYMPTOM  OR DIAGNOSIS REQUIRED)    Answer:   left knee pain    Order Specific Question:   Preferred imaging location?    Answer:   Adult nurse [facility] ordered this encounter  Medications   Triamcinolone Acetonide (ZILRETTA) intra-articular injection 32 mg     Discussed warning signs or symptoms. Please see discharge instructions. Patient expresses understanding.   The above documentation has been reviewed and is accurate and complete Clementeen Graham, M.D.

## 2022-11-19 NOTE — Patient Instructions (Signed)
Thank you for coming in today.   You received an injection today. Seek immediate medical attention if the joint becomes red, extremely painful, or is oozing fluid.  

## 2022-11-21 ENCOUNTER — Ambulatory Visit: Payer: Medicare Other | Admitting: Primary Care

## 2022-11-21 ENCOUNTER — Encounter: Payer: Self-pay | Admitting: Primary Care

## 2022-11-21 VITALS — BP 126/72 | HR 71 | Temp 98.2°F | Ht 70.0 in | Wt 215.4 lb

## 2022-11-21 DIAGNOSIS — J4489 Other specified chronic obstructive pulmonary disease: Secondary | ICD-10-CM

## 2022-11-21 DIAGNOSIS — K219 Gastro-esophageal reflux disease without esophagitis: Secondary | ICD-10-CM

## 2022-11-21 DIAGNOSIS — G4733 Obstructive sleep apnea (adult) (pediatric): Secondary | ICD-10-CM

## 2022-11-21 DIAGNOSIS — J309 Allergic rhinitis, unspecified: Secondary | ICD-10-CM

## 2022-11-21 MED ORDER — TRELEGY ELLIPTA 200-62.5-25 MCG/ACT IN AEPB: 1.0000 | INHALATION_SPRAY | Freq: Every day | RESPIRATORY_TRACT | Status: DC

## 2022-11-21 MED ORDER — PANTOPRAZOLE SODIUM 40 MG PO TBEC: 40.0000 mg | DELAYED_RELEASE_TABLET | Freq: Two times a day (BID) | ORAL | 3 refills | Status: DC

## 2022-11-21 MED ORDER — AZITHROMYCIN 250 MG PO TABS: ORAL_TABLET | ORAL | 0 refills | Status: DC

## 2022-11-21 MED ORDER — TRELEGY ELLIPTA 200-62.5-25 MCG/ACT IN AEPB: 1.0000 | INHALATION_SPRAY | Freq: Every day | RESPIRATORY_TRACT | 5 refills | Status: DC

## 2022-11-21 MED ORDER — MONTELUKAST SODIUM 10 MG PO TABS: 10.0000 mg | ORAL_TABLET | Freq: Every day | ORAL | 1 refills | Status: DC

## 2022-11-21 MED ORDER — PREDNISONE 10 MG PO TABS: ORAL_TABLET | ORAL | 0 refills | Status: DC

## 2022-11-21 NOTE — Progress Notes (Signed)
@Patient  ID: Allen [name], male    DOB: 1950-03-26, 72 y.o.   MRN: [id number]  Chief Complaint  Patient presents with   Follow-up    Referring provider: Philip Aspen, Almira Bar*  HPI: 72 year old male, former smoker quit 1991 (60-pack-year history).  Past medical history significant for COPD with asthma, emphysema, lung nodule, OSA on BiPAP, sleep-related hypoxia, allergic rhinitis, chronic diastolic heart failure, GERD, ulcerative colitis, essential tremor, degenerative disc disease, vitamin D deficiency.  Patient of Dr. Craige Cotta, last seen 09/14/22.  11/21/2022- interim  Discussed the use of AI scribe software for clinical note transcription with the patient, who gave verbal consent to proceed.  History of Present Illness   Patient presents today for 25-month follow-up.  Follows with our office for COPD with emphysema and asthma, lung nodules, obstructive sleep apnea,voice hoarseness with reflux.   Patient maintained on Trelegy 100 mcg 1 puff daily and as needed albuterol.  He keeps a prescription of Zithromax and prednisone in case of a flareup when he travels.  Lung nodule decreased in size on CT chest from April 2024, no additional radiographic follow-up needed. OSA resolved with weight loss.  He reports difficulty breathing, particularly in the evenings while sitting at rest after dinner. Along with an associated burning sensation in the chest.   The patient has been experiencing what sounds like symptoms of acid reflux or GERD, particularly in the evenings. The patient has been prescribed omeprazole, but reports it has not been effective. He is interested in trying Pantoprazole because that is what his wife takes.  The patient has been taking Singulair for allergies, and reports that symptoms improved when restarting this medication after a period of discontinuation. The patient also reports hoarseness, which may be related to acid reflux.  The patient has been able to maintain his  weight loss, which has helped manage sleep apnea. The patient is also taking Sonata and Trazodone for sleep, and reports no residual daytime grogginess or fatigue. The patient is able to sleep through the night without waking up gasping or choking.   Allergies  Allergen Reactions   Copper-Containing Compounds     Copper gloves caused redness and blisters bilateral hands    Immunization History  Administered Date(s) Administered   Fluad Quad(high Dose 65+) 09/18/2018, 11/10/2019   Fluad Trivalent(High Dose 65+) 10/22/2022   Influenza, High Dose Seasonal PF 11/01/2020   Influenza-Unspecified 10/06/2021   Moderna Covid-19 Fall Seasonal Vaccine 30yrs & older 10/22/2022   PFIZER Comirnaty(Gray Top)Covid-19 Tri-Sucrose Vaccine 10/06/2021   PFIZER(Purple Top)SARS-COV-2 Vaccination 02/20/2019, 03/17/2019, 06/02/2019, 10/03/2019, 11/01/2020   Pneumococcal Polysaccharide-23 01/08/2015   Pneumococcal-Unspecified 12/15/2017   Tdap 09/17/2016   Zoster Recombinant(Shingrix) 06/06/2019, 09/05/2019    Past Medical History:  Diagnosis Date   Anxiety    Arthritis    neck -   BPH with obstruction/lower urinary tract symptoms    urologist-- dr pace   Cancer [facility])    basal cell to nose   Centrilobular emphysema (HCC)    followed by dr Craige Cotta   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(HCC)    followed by dr Craige Cotta   Cough 07/2020   covid residual, no congestion   DOE (dyspnea on exertion)    due to copd/ emphysema   ED (erectile dysfunction)    Essential tremor    neurologist--- dr tat----  bilateral upper extremities,  s/p DPS 12/ 2016   GERD (gastroesophageal reflux disease)    History of 2019 novel coronavirus disease (COVID-19) 07/18/2020   per pt postive  home test 07-18-2020 w/ mild  symptoms per pt; pt stated had urgent care visit 07-24-2020 for sob/ cough with negative cxr (in care everywhere) ;  follow up pcp visit 08-03-2020 in epic   Insomnia    Mixed simple and mucopurulent chronic bronchitis (HCC)    OSA  (obstructive sleep apnea)    followed by dr Craige Cotta---- no longer does uses bipap, intolerant --- pt prescriped  Oxygen  2 L oxygen via [place] while sleeping  (study in epic 04/ 2017 [facility] 40)   Perennial allergic rhinitis    Pneumonia    Pulmonary nodules    followed by dr Craige Cotta   S/P deep brain stimulator placement 12/2014   followed by neurology-----IPG change 01-21-2020 for end-of life battery  (pt has a control)   Varicose vein of leg    per pt has had 3 procedure's last one 2006   Wears dentures    upper    Tobacco History: Social History   Tobacco Use  Smoking Status Former   Current packs/day: 0.00   Average packs/day: 3.0 packs/day for 20.0 years (60.0 ttl pk-yrs)   Types: Cigarettes   Start date: 11   Quit date: 54   Years since quitting: 33.8  Smokeless Tobacco Never   Counseling given: Not Answered   Outpatient Medications Prior to Visit  Medication Sig Dispense Refill   albuterol (VENTOLIN HFA) 108 (90 Base) MCG/ACT inhaler Inhale 1-2 puffs into the lungs every 4 (four) hours as needed for wheezing or shortness of breath. 18 g 2   Azelastine-Fluticasone 137-50 MCG/ACT SUSP Place 1 spray into the nose in the morning and at bedtime. 23 g 5   busPIRone (BUSPAR) 30 MG tablet Take 30 mg by mouth at bedtime.     carbidopa-levodopa (SINEMET IR) 25-100 MG tablet Take 1 tablet by mouth 3 (three) times daily. Titrating up takes 1/2 tablet 3 times daily     Cholecalciferol (VITAMIN D3) 125 MCG (5000 UT) TABS Take 5,000 Units by mouth in the morning.     Cyanocobalamin 2500 MCG SUBL Place 5,000 mcg under the tongue daily.     escitalopram (LEXAPRO) 10 MG tablet Take 10 mg by mouth daily.     Fluticasone-Umeclidin-Vilant (TRELEGY ELLIPTA) 100-62.5-25 MCG/ACT AEPB Inhale 1 puff into the lungs daily. 180 each 3   montelukast (SINGULAIR) 10 MG tablet TAKE 1 TABLET BY MOUTH AT  BEDTIME 100 tablet 1   Multiple Vitamins-Minerals (PRESERVISION AREDS) TABS Take 1 tablet by mouth daily.      mupirocin ointment (BACTROBAN) 2 % Apply 1 Application topically 3 (three) times daily as needed.     Omega-3 Fatty Acids (FISH OIL PO) Take 2,200 mg by mouth in the morning.     omeprazole (PRILOSEC) 40 MG capsule Take 40 mg, 1 capsule, by mouth once to twice daily (Patient taking differently: Take 40 mg by mouth daily. Take 40 mg, 1 capsule, by mouth once to twice daily) 180 capsule 3   Semaglutide-Weight Management (WEGOVY) 0.25 MG/0.5ML SOAJ Inject 0.25 mg into the skin once a week. 2 mL 0   tadalafil (CIALIS) 20 MG tablet Take 1 tablet (20 mg total) by mouth daily as needed for erectile dysfunction. (Patient taking differently: Take 10 mg by mouth daily.) 90 tablet 3   tamsulosin (FLOMAX) 0.4 MG CAPS capsule Take 0.4 mg by mouth in the morning.     traZODone (DESYREL) 100 MG tablet TAKE 1 TABLET AT BEDTIME 90 tablet 1   triamcinolone (KENALOG) 0.025 %  cream Apply 1 Application topically 2 (two) times daily as needed.     TURMERIC PO Take 2,250 mg by mouth in the morning.     zaleplon (SONATA) 10 MG capsule Take 10 mg by mouth at bedtime.     HYDROcodone-acetaminophen (NORCO/VICODIN) 5-325 MG tablet Take 1-2 tablets by mouth every 6 (six) hours as needed for moderate pain (for post surgery). (Patient not taking: Reported on 11/21/2022)     No facility-administered medications prior to visit.    Review of Systems  Review of Systems  Constitutional:  Positive for fatigue.  Respiratory:  Positive for shortness of breath.   Gastrointestinal:        Heart burn  Psychiatric/Behavioral:  Negative for sleep disturbance.      Physical Exam  BP 126/72 (BP Location: Right Arm, Patient Position: Sitting, Cuff Size: Normal)   Pulse 71   Temp 98.2 F (36.8 C) (Oral)   Ht 5\' 10"  (1.778 m)   Wt 215 lb 6.4 oz (97.7 kg)   SpO2 96%   BMI 30.91 kg/m  Physical Exam Constitutional:      Appearance: Normal appearance.  HENT:     Head: Normocephalic and atraumatic.  Cardiovascular:     Rate  and Rhythm: Normal rate and regular rhythm.  Pulmonary:     Effort: Pulmonary effort is normal.     Breath sounds: Normal breath sounds.  Neurological:     General: No focal deficit present.     Mental Status: He is oriented to person, place, and time. Mental status is at baseline.  Psychiatric:        Mood and Affect: Mood normal.        Behavior: Behavior normal.        Thought Content: Thought content normal.        Judgment: Judgment normal.      Lab Results:  CBC    Component Value Date/Time   WBC 7.1 07/25/2022 1015   RBC 3.98 (L) 07/25/2022 1015   HGB 12.8 (L) 07/25/2022 1015   HCT 39.6 07/25/2022 1015   PLT 315.0 07/25/2022 1015   MCV 99.5 07/25/2022 1015   MCH 31.4 08/22/2021 0906   MCHC 32.3 07/25/2022 1015   RDW 14.3 07/25/2022 1015   LYMPHSABS 1.4 07/25/2022 1015   MONOABS 0.6 07/25/2022 1015   EOSABS 0.2 07/25/2022 1015   BASOSABS 0.0 07/25/2022 1015    BMET    Component Value Date/Time   NA 141 07/25/2022 1015   K 4.4 07/25/2022 1015   CL 103 07/25/2022 1015   CO2 30 07/25/2022 1015   GLUCOSE 100 (H) 07/25/2022 1015   BUN 25 (H) 07/25/2022 1015   CREATININE 1.02 07/25/2022 1015   CALCIUM 9.4 07/25/2022 1015   GFRNONAA >60 08/22/2021 0906    BNP No results found for: "BNP"  ProBNP    Component Value Date/Time   PROBNP 33.0 04/10/2019 0941    Imaging: [place] LIMITED JOINT SPACE STRUCTURES LOW LEFT(NO LINKED CHARGES)  Result Date: 11/07/2022 Procedure: Real-time Ultrasound Guided Injection of left knee joint superior lateral patella space Device: Philips Affiniti 50G/GE Logiq Images permanently stored and available for review in PACS Verbal informed consent obtained.  Discussed risks and benefits of procedure. Warned about infection, bleeding, hyperglycemia damage to structures among others. Patient expresses understanding and agreement Time-out conducted.  Noted no overlying erythema, induration, or other signs of local infection.  Skin prepped in  a sterile fashion.  Local anesthesia: Topical Ethyl chloride.  With  sterile technique and under real time ultrasound guidance: 40 mg of Kenalog and 2 mL of Marcaine injected into knee joint. Fluid seen entering the joint capsule.  Completed without difficulty  Pain immediately resolved suggesting accurate placement of the medication.  Advised to call if fevers/chills, erythema, induration, drainage, or persistent bleeding.  Images permanently stored and available for review in the ultrasound unit. Impression: Technically successful ultrasound guided injection.     Assessment & Plan:   1. 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(HCC)  2. Mild obstructive sleep apnea  3. Gastroesophageal reflux disease, unspecified whether esophagitis present  4. Allergic rhinitis, unspecified seasonality, unspecified trigger   COPD Reports increased difficulty breathing in the evenings, possibly related to GERD. Concerns about potential exacerbation of COPD symptoms while traveling. Provide Z-Pak and prednisone taper to take as needed  -Increase Trelegy to daily. -Continue use of albuterol as rescue inhaler.  GERD Reports burning sensation in chest in the evenings, suggestive of reflux. Currently on omeprazole, but reports medication is ineffective -Switch to Protonix, take twice daily 30 minutes before meals.  Allergies Reports improvement in symptoms with Singulair. -Continue Singulair as needed.  Sleep Apnea Resolved with weight loss. Currently taking Sonata and Trazodone 100mg  for sleep. -Continue current sleep aids. -Encourage side sleeping or use of wedge pillow.  Weight Management Current weight 215lbs, down from 260lbs. Taking Mounjaro for weight loss. -Encourage continued weight loss efforts.  Lung Nodule Resolved, no further follow-up needed.      Glenford Bayley, NP 11/21/2022

## 2022-11-21 NOTE — Patient Instructions (Signed)
Recommendations: - Change omeprazole to Pantoprazole- take 30 mins before breakfast and dinner - Change Trelegy dose from to day - Sending in zpack and prednisone taper to have on hand while traveling - Use Albuterol rescue inhaler every 4-6 hours as needed for breakthrough shortness of breath/wheezing  - We can discuss changing rescue inhaler to [place] when you return  - Our goal is to eliminate oral steroid use / we want to use the least amount of steroids as possible  - Sleep on your side or at an incline - Continue weight loss efforts  Follow-up 6 months with Dr. Francine Graven or Dr.  Judeth Horn (Former [facility] patient- 30 min visit)

## 2022-12-10 NOTE — Telephone Encounter (Signed)
Pt received Zilretta injection for LEFT knee OA on 11/19/22. Can consider repeat injection on or after 02/12/23.

## 2022-12-25 DIAGNOSIS — M1711 Unilateral primary osteoarthritis, right knee: Secondary | ICD-10-CM | POA: Diagnosis not present

## 2023-01-17 NOTE — Telephone Encounter (Signed)
 VOB initiated for 2025 coverage of Zilretta.

## 2023-01-21 ENCOUNTER — Telehealth: Payer: Self-pay | Admitting: Primary Care

## 2023-01-21 NOTE — Telephone Encounter (Signed)
 Patient states needs refill for Trelegy, Albuterol and Singulair. Needs 3 month supply. Pharmacy is [facility] mail order. Patient phone number is [phone].

## 2023-01-22 MED ORDER — TRELEGY ELLIPTA 200-62.5-25 MCG/ACT IN AEPB: 1.0000 | INHALATION_SPRAY | Freq: Every day | RESPIRATORY_TRACT | 2 refills | Status: DC

## 2023-01-22 MED ORDER — MONTELUKAST SODIUM 10 MG PO TABS: 10.0000 mg | ORAL_TABLET | Freq: Every day | ORAL | 2 refills | Status: DC

## 2023-01-22 MED ORDER — ALBUTEROL SULFATE HFA 108 (90 BASE) MCG/ACT IN AERS: 1.0000 | INHALATION_SPRAY | RESPIRATORY_TRACT | 2 refills | Status: DC | PRN

## 2023-01-22 NOTE — Telephone Encounter (Signed)
 Refills sent to mail ordered.

## 2023-01-23 NOTE — Telephone Encounter (Signed)
VOB initiated for [facility] for LEFT knee OA.

## 2023-01-24 ENCOUNTER — Ambulatory Visit: Payer: Medicare (Managed Care) | Admitting: Podiatry

## 2023-01-24 ENCOUNTER — Encounter: Payer: Self-pay | Admitting: Podiatry

## 2023-01-24 DIAGNOSIS — B351 Tinea unguium: Secondary | ICD-10-CM | POA: Diagnosis not present

## 2023-01-24 DIAGNOSIS — M79674 Pain in right toe(s): Secondary | ICD-10-CM

## 2023-01-24 DIAGNOSIS — M79675 Pain in left toe(s): Secondary | ICD-10-CM

## 2023-01-24 NOTE — Progress Notes (Signed)
This patient returns to the office for evaluation and treatment of long thick painful nails .  This patient is unable to trim his own nails since the patient cannot reach his feet.  Patient says the nails are painful walking and wearing his shoes.  He returns for preventive foot care services.  General Appearance  Alert, conversant and in no acute stress.  Vascular  Dorsalis pedis and posterior tibial  pulses are palpable  bilaterally.  Capillary return is within normal limits  bilaterally. Temperature is within normal limits  bilaterally.  Neurologic  Senn-Weinstein monofilament wire test within normal limits  bilaterally. Muscle power within normal limits bilaterally.  Nails Thick disfigured discolored nails with subungual debris  from hallux to fifth toes bilaterally.  Hallux nails are especially thickened and split and painful   No evidence of bacterial infection or drainage bilaterally.  Orthopedic  No limitations of motion  feet .  No crepitus or effusions noted.  No bony pathology or digital deformities noted.  Skin  normotropic skin with no porokeratosis noted bilaterally.  No signs of infections or ulcers noted.     Onychomycosis  Pain in toes right foot  Pain in toes left foot  Debridement  of nails  1-5  B/L with a nail nipper.  Nails were then filed using a dremel tool with no incidents.    RTC 4  months    Antionetta Ator DPM  

## 2023-01-25 ENCOUNTER — Other Ambulatory Visit (HOSPITAL_COMMUNITY): Payer: Self-pay | Admitting: Orthopaedic Surgery

## 2023-01-25 DIAGNOSIS — M25561 Pain in right knee: Secondary | ICD-10-CM

## 2023-01-28 ENCOUNTER — Telehealth: Payer: Self-pay | Admitting: Primary Care

## 2023-01-28 MED ORDER — ALBUTEROL SULFATE HFA 108 (90 BASE) MCG/ACT IN AERS: 1.0000 | INHALATION_SPRAY | RESPIRATORY_TRACT | 2 refills | Status: DC | PRN

## 2023-01-28 MED ORDER — TRELEGY ELLIPTA 200-62.5-25 MCG/ACT IN AEPB: 1.0000 | INHALATION_SPRAY | Freq: Every day | RESPIRATORY_TRACT | 3 refills | Status: DC

## 2023-01-28 MED ORDER — MONTELUKAST SODIUM 10 MG PO TABS: 10.0000 mg | ORAL_TABLET | Freq: Every day | ORAL | 3 refills | Status: DC

## 2023-01-28 NOTE — Telephone Encounter (Signed)
 Patient states [facility] does not have the refills for Trelegy, Albuterol and Singulair. Needs 3 month supply. Patient phone number is [phone].

## 2023-01-28 NOTE — Telephone Encounter (Signed)
 Called patient and left a detailed message to let him know requested prescriptions were sent to [facility].  Prescriptions had been previously sent  to a different pharmacy.  Call back number left for patient to call, if needed.

## 2023-01-30 NOTE — Telephone Encounter (Signed)
 Per [employer] - The policy [id number] has been terminated as of 01/15/2023  Update insurance

## 2023-01-31 NOTE — Telephone Encounter (Signed)
Per [employer], [employer] policy has termed.  Update insurance

## 2023-02-02 ENCOUNTER — Ambulatory Visit (HOSPITAL_COMMUNITY): Payer: Medicare Other

## 2023-02-05 ENCOUNTER — Ambulatory Visit (HOSPITAL_COMMUNITY)
Admission: RE | Admit: 2023-02-05 | Discharge: 2023-02-05 | Disposition: A | Discharge status: Home / Self Care | Payer: Medicare (Managed Care) | Source: Ambulatory Visit | Attending: Orthopaedic Surgery | Admitting: Orthopaedic Surgery

## 2023-02-05 DIAGNOSIS — M25561 Pain in right knee: Secondary | ICD-10-CM | POA: Diagnosis not present

## 2023-02-06 DIAGNOSIS — H35363 Drusen (degenerative) of macula, bilateral: Secondary | ICD-10-CM | POA: Diagnosis not present

## 2023-02-07 ENCOUNTER — Ambulatory Visit (INDEPENDENT_AMBULATORY_CARE_PROVIDER_SITE_OTHER): Payer: Medicare (Managed Care)

## 2023-02-07 ENCOUNTER — Ambulatory Visit: Payer: Medicare (Managed Care) | Admitting: Podiatry

## 2023-02-07 ENCOUNTER — Encounter: Payer: Self-pay | Admitting: Podiatry

## 2023-02-07 VITALS — Ht 70.0 in | Wt 215.0 lb

## 2023-02-07 DIAGNOSIS — G629 Polyneuropathy, unspecified: Secondary | ICD-10-CM | POA: Diagnosis not present

## 2023-02-07 DIAGNOSIS — M25572 Pain in left ankle and joints of left foot: Secondary | ICD-10-CM

## 2023-02-07 MED ORDER — GABAPENTIN 300 MG PO CAPS
300.0000 mg | ORAL_CAPSULE | Freq: Three times a day (TID) | ORAL | 3 refills | Status: DC
Start: 2023-02-07 — End: 2023-10-08

## 2023-02-07 NOTE — Progress Notes (Signed)
Subjective:   Patient ID: Allen [name], male   DOB: 73 y.o.   MRN: [id number]   HPI Patient presents stating that he is getting a lot of burning shooting tingling in his left forefoot with the big toe being the worst.  Does not remember injury and states it has been somewhat like this for a while but over the last month it has been much worse.  Patient has had no other changes in health history   Review of Systems  All other systems reviewed and are negative.       Objective:  Physical Exam Vitals and nursing note reviewed.  Constitutional:      Appearance: He is well-developed.  Pulmonary:     Effort: Pulmonary effort is normal.  Musculoskeletal:        General: Normal range of motion.  Skin:    General: Skin is warm.  Neurological:     Mental Status: He is alert.     Neurovascular status was found to be intact with possible reduction of sharp dull vibratory.  Range of motion adequate muscle strength adequate with patient found to have no history of back injuries no current sciatica or other pathology     Assessment:  Appears to be some form of an acute neuropathic condition cannot rule out inflammatory arthritic F2 H&P x-ray reviewed at this time I went ahead and I am placing this patient on gabapentin and will start 1 at night and tolerated well 1 in the morning 1 midday.  All questions answered concerning this and reviewed what would be necessary     Plan:  Above plan is in place and x-rays indicate no signs of fracture or bony injury associated with condition

## 2023-02-15 NOTE — Telephone Encounter (Signed)
Insurance updated to [employer].  VOB re-submitted  Subscriber [id number]  Group number: [id number]

## 2023-02-16 ENCOUNTER — Other Ambulatory Visit: Payer: Self-pay | Admitting: Primary Care

## 2023-02-18 NOTE — Telephone Encounter (Signed)
Call received from [facility] for additional information. MRI reports of the left knee faxed.

## 2023-02-18 NOTE — Telephone Encounter (Signed)
Medical Buy and An[employer]te Stable - Prior Authorization REQUIRED  Pharmacy Benefit - Prior Authorization NOT required

## 2023-02-18 NOTE — Telephone Encounter (Signed)
PA initiated Reference # [id number]

## 2023-02-18 NOTE — Telephone Encounter (Signed)
 Medical Buy and Annette Stable - Prior Authorization REQUIRED  Pharmacy Benefit - NOT covered

## 2023-02-20 ENCOUNTER — Telehealth: Payer: Self-pay

## 2023-02-20 DIAGNOSIS — H539 Unspecified visual disturbance: Secondary | ICD-10-CM

## 2023-02-20 NOTE — Telephone Encounter (Signed)
 Referral placed.

## 2023-02-20 NOTE — Telephone Encounter (Addendum)
Medical Buy and Annette Stable  Prior Auth initiated for [place] via [facility] [id number]        Prior Auth also initiated via FAX form.

## 2023-02-20 NOTE — Telephone Encounter (Signed)
 Copied from CRM [id number]. Topic: Referral - Request for Referral >> Feb 20, 2023 11:45 AM Burnard DEL wrote: Did the patient discuss referral with their provider in the last year? Yes (If No - schedule appointment) (If Yes - send message)  Appointment offered? No  Type of order/referral and detailed reason for visit: [facility]/decline in vision with frequent floaters/patient needs new referral  Preference of office, provider, location: [phone]  If referral order, have you been seen by this specialty before? Yes (If Yes, this issue or another issue? When? Where?  Can we respond through MyChart? Yes

## 2023-02-20 NOTE — Addendum Note (Signed)
Addended by: Kern Reap B on: 02/20/2023 02:30 PM   Modules accepted: Orders

## 2023-02-22 NOTE — Telephone Encounter (Signed)
 Fax request received requesting clarification. From completed and faxed back to [employer].

## 2023-02-25 NOTE — Telephone Encounter (Signed)
 Called received from [place] with [facility] regarding case ID [id number].  Call back number [phone].

## 2023-02-25 NOTE — Telephone Encounter (Addendum)
ZILRETTA for LEFT knee OA    Medical Buy and Bill   Primary Insurance: [employer] Adv HMO Co-pay: $20 Co-insurance: 20% Deductible: does not apply Prior Auth: APPROVED PA# [id number] Valid: 02/27/23-05/27/23   Pharmacy Benefit   PBM: [facility] Co-pay: $12.15 Co-insurance:  Deductible: does not apply Prior Auth: NOT required PA# [id number]     Knee Injection History 04/18/22 - Kenalog LEFT 10/23/22 - Kenalog LEFT 11/19/22 - Zilretta LEFT

## 2023-02-25 NOTE — Telephone Encounter (Addendum)
 Durolane for LEFT knee OA   Medical Buy and Bill  Primary Insurance: [employer] Adv HMO Co-pay:  Co-insurance:  Deductible:  Prior Auth: DENIED  Pharmacy Benefit  PBM: [place] Co-pay:  Co-insurance:  Deductible:  Prior Auth: NOT covered Initiated via [email or web address] KEY: [id number] Message from [facility]: Drug is not covered by plan   Knee Injection History 04/18/22 - Kenalog  LEFT 10/23/22 - Kenalog  LEFT 11/19/22 - Zilretta  LEFT

## 2023-02-26 ENCOUNTER — Ambulatory Visit: Payer: Self-pay | Admitting: Internal Medicine

## 2023-02-26 NOTE — Telephone Encounter (Signed)
Step 1 alternatives (Monovisc,  Orthovisc, Synvisc, or Synvisc-One)  Will check benefits for Monovisc.

## 2023-02-26 NOTE — Telephone Encounter (Signed)
Copied from CRM [id number]. Topic: Clinical - Red Word Triage >> Feb 26, 2023  2:30 PM Adele Barthel wrote: Red Word that prompted transfer to Nurse Triage: Allen [name] to urgent care last Monday for symptoms Phlegm was clear at first.   This Sunday, went back to urgent care Was told not viral but diagnosed with bronchitis. Prescribed antibiotics and cough syrup.  Tested twice for COVID/flu was negative History of COPD Severe cough, especially at night   Medications are not beneficial.   Chief Complaint: Persistent cough Symptoms: Productive cough, brown sputum Pertinent Negatives: Patient denies fever Disposition: [] ED /[] Urgent Care (no appt availability in office) / [x] Appointment(In office/virtual)/ []  [name]/ [] Home Care/ [] Refused Recommended Disposition /[] [facility]/ []  Follow-up with PCP Additional Notes: Patient stated he has been in urgent care twice within the past week due to cough. Patient was given prescriptions for Tessalon, Doxycyline, and Prednisone on 2/9. Patient has been taking them as prescribed and has not had any improvement yet. Patient is concerned due to COPD and requesting to be seen in the office. No appt available with PCP with week. Appt scheduled for tomorrow morning with different provider in the office.   Reason for Disposition  [1] Continuous (nonstop) coughing interferes with work or school AND [2] no improvement using cough treatment per Care Advice    Interfering with sleep  Answer Assessment - Initial Assessment Questions 1. ONSET: "When did the cough begin?"      A week ago  2. SEVERITY: "How bad is the cough today?"      Very bad cough, keeping patient up at night  3. SPUTUM: "Describe the color of your sputum" (none, dry cough; clear, white, yellow, green)     Brown   4. HEMOPTYSIS: "Are you coughing up any blood?" If so ask: "How much?" (flecks, streaks, tablespoons, etc.)     N/A  5. DIFFICULTY BREATHING: "Are you  having difficulty breathing?" If Yes, ask: "How bad is it?" (e.g., mild, moderate, severe)    - MILD: No SOB at rest, mild SOB with walking, speaks normally in sentences, can lie down, no retractions, pulse < 100.    - MODERATE: SOB at rest, SOB with minimal exertion and prefers to sit, cannot lie down flat, speaks in phrases, mild retractions, audible wheezing, pulse 100-120.    - SEVERE: Very SOB at rest, speaks in single words, struggling to breathe, sitting hunched forward, retractions, pulse > 120      No  6. FEVER: "Do you have a fever?" If Yes, ask: "What is your temperature, how was it measured, and when did it start?"     No   7. OTHER SYMPTOMS: "Do you have any other symptoms?" (e.g., runny nose, wheezing, chest pain)      Hurts to cough, a little bit wheezing (denies difficulty breathing at this time)  ,  Protocols used: Cough - Acute Productive-A-AH

## 2023-02-26 NOTE — Telephone Encounter (Deleted)
Fax received from [facility]:  Prior Authorization is NOT required  The plan's limit for this medication is not being exceeded at the current prescribed dose.   This med is covered claims are paying at the pharmacy.

## 2023-02-26 NOTE — Telephone Encounter (Deleted)
Pre-certification form printed from [facility] portal, completed and faxed to [facility].

## 2023-02-27 ENCOUNTER — Ambulatory Visit (INDEPENDENT_AMBULATORY_CARE_PROVIDER_SITE_OTHER): Payer: Medicare (Managed Care) | Admitting: Family Medicine

## 2023-02-27 VITALS — BP 126/70 | HR 90 | Temp 98.2°F | Wt 218.8 lb

## 2023-02-27 DIAGNOSIS — J441 Chronic obstructive pulmonary disease with (acute) exacerbation: Secondary | ICD-10-CM | POA: Diagnosis not present

## 2023-02-27 MED ORDER — HYDROCODONE BIT-HOMATROP MBR 5-1.5 MG/5ML PO SOLN: 5.0000 mL | Freq: Four times a day (QID) | ORAL | 0 refills | Status: DC | PRN

## 2023-02-27 NOTE — Telephone Encounter (Signed)
Ran for [employer]

## 2023-02-27 NOTE — Progress Notes (Signed)
Established Patient Office Visit  Subjective   Patient ID: Allen [name], male    DOB: 1950/06/22  Age: 73 y.o. MRN: [id number]  Chief Complaint  Patient presents with   Cough    Patient complains of cough, x1.5 weeks, Productive with brownish sputum, Tried Doxycycline     HPI   [name] is seen in his work and with some persistent cough.  He has history of COPD.  Remote history of smoking.  He went to urgent care back on the ninth of this month and had influenza and COVID testing which were negative.  He was started on doxycycline which she remains on at this time along with prednisone for 5 days.  He takes Trelegy and Singulair at baseline.  Cough productive of brown sputum.  No fever.  No significant dyspnea at rest or with light activity.  His main concern is difficulty sleeping secondary to severe cough at night.  He is requesting cough medication.  Was treated with Tessalon which did not seem to help at all with his cough.  Past Medical History:  Diagnosis Date   Anxiety    Arthritis    neck -   BPH with obstruction/lower urinary tract symptoms    urologist-- dr pace   Cancer [facility])    basal cell to nose   Centrilobular emphysema (HCC)    followed by dr Craige Cotta   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[facility])    followed by dr Craige Cotta   Cough 07/2020   covid residual, no congestion   DOE (dyspnea on exertion)    due to copd/ emphysema   ED (erectile dysfunction)    Essential tremor    neurologist--- dr tat----  bilateral upper extremities,  s/p DPS 12/ 2016   GERD (gastroesophageal reflux disease)    History of 2019 novel coronavirus disease (COVID-19) 07/18/2020   per pt postive home test 07-18-2020 w/ mild  symptoms per pt; pt stated had urgent care visit 07-24-2020 for sob/ cough with negative cxr (in care everywhere) ;  follow up pcp visit 08-03-2020 in epic   Insomnia    Mixed simple and mucopurulent chronic bronchitis (HCC)    OSA (obstructive sleep apnea)    followed by dr Craige Cotta---- no longer  does uses bipap, intolerant --- pt prescriped  Oxygen  2 L oxygen via  while sleeping  (study in epic 04/ 2017 AHI 40)   Perennial allergic rhinitis    Pneumonia    Pulmonary nodules    followed by dr Craige Cotta   S/P deep brain stimulator placement 12/2014   followed by neurology-----IPG change 01-21-2020 for end-of life battery  (pt has a control)   Varicose vein of leg    per pt has had 3 procedure's last one 2006   Wears dentures    upper   Past Surgical History:  Procedure Laterality Date   ANKLE SURGERY Right 2020   tendon repair   APPENDECTOMY     CARPAL TUNNEL RELEASE Left 2019   CATARACT EXTRACTION W/ INTRAOCULAR LENS IMPLANT Bilateral 2020   COLONOSCOPY     CYSTOSCOPY WITH URETHRAL DILATATION N/A 08/29/2021   Procedure: CYSTOSCOPY WITH URETHRAL DILATATION;  Surgeon: Noel Christmas, MD;  Location: [facility];  Service: Urology;  Laterality: N/A;   DEEP BRAIN STIMULATOR PLACEMENT Left 2016   INGUINAL HERNIA REPAIR Right 2007   KNEE ARTHROSCOPY W/ MENISCECTOMY Right 06/25/2022   ROTATOR CUFF REPAIR Right 2014   SUBTHALAMIC STIMULATOR BATTERY REPLACEMENT N/A 01/21/2020   Procedure: Deep  brain stimulator battery replacement;  Surgeon: Maeola Harman, MD;  Location: [facility] OR;  Service: Neurosurgery;  Laterality: N/A;   TONSILLECTOMY AND ADENOIDECTOMY     age 69   TOTAL SHOULDER REPLACEMENT Left 2016   TRANSURETHRAL RESECTION OF PROSTATE N/A 08/09/2020   Procedure: TRANSURETHRAL RESECTION OF THE PROSTATE (TURP), BLADDER BIOPSY AND FULGERATION;  Surgeon: Noel Christmas, MD;  Location: [facility];  Service: Urology;  Laterality: N/A;   TRANSURETHRAL RESECTION OF PROSTATE N/A 08/29/2021   Procedure: RE-TRANSURETHRAL RESECTION OF THE PROSTATE (TURP);  Surgeon: Noel Christmas, MD;  Location: [facility];  Service: Urology;  Laterality: N/A;   VEIN SURGERY Left 2006   varicose veins    reports that he quit smoking about 34 years ago. His smoking use included cigarettes. He  started smoking about 54 years ago. He has a 60 pack-year smoking history. He has never used smokeless tobacco. He reports current alcohol use of about 1.0 standard drink of alcohol per week. He reports that he does not use drugs. family history includes Asthma in his father; COPD in his mother; Healthy in his child; Rectal cancer in his sister. Allergies  Allergen Reactions   Copper-Containing Compounds     Copper gloves caused redness and blisters bilateral hands    Review of Systems  Constitutional:  Negative for chills and fever.  HENT:  Negative for sinus pain.   Respiratory:  Positive for cough and sputum production. Negative for hemoptysis.   Cardiovascular:  Negative for chest pain.      Objective:     BP 126/70 (BP Location: Left Arm, Patient Position: Sitting, Cuff Size: Normal)   Pulse 90   Temp 98.2 F (36.8 C) (Oral)   Wt 218 lb 12.8 oz (99.2 kg)   SpO2 95%   BMI 31.39 kg/m  BP Readings from Last 3 Encounters:  02/27/23 126/70  11/21/22 126/72  11/19/22 118/82   Wt Readings from Last 3 Encounters:  02/27/23 218 lb 12.8 oz (99.2 kg)  02/07/23 215 lb (97.5 kg)  11/21/22 215 lb 6.4 oz (97.7 kg)      Physical Exam Vitals reviewed.  Constitutional:      General: He is not in acute distress.    Appearance: He is not ill-appearing.  Cardiovascular:     Rate and Rhythm: Normal rate and regular rhythm.  Pulmonary:     Comments: Only few faint expiratory wheezes.  Good breath sounds throughout.  No rales.  No retractions.  Pulse oximeter 95% room air Neurological:     Mental Status: He is alert.      No results found for any visits on 02/27/23.    The 10-year ASCVD risk score (Arnett DK, et al., 2019) is: 16.9%    Assessment & Plan:   Recent acute exacerbation of COPD.  Suspect viral trigger.  Patient currently finishing up prescriptions for prednisone and doxycycline.  Only minimal wheezing on exam at this time.  No respiratory distress.  -Finish  out doxycycline and prednisone -Continue usual inhalers with Trelegy and rescue inhaler with albuterol as needed -Wrote for limited Hycodan cough syrup 1 teaspoon nightly for severe nighttime cough -Continue daytime use of Mucinex twice daily and stay well-hydrated -Follow-up immediately for any fever or increased shortness of breath  Evelena Peat, MD

## 2023-02-27 NOTE — Patient Instructions (Signed)
Follow up for any fever, increased shortness of breath, or if cough not resolving in 2-3 weeks.

## 2023-02-27 NOTE — Telephone Encounter (Signed)
Hey Bri, looks like we need to change to [employer]. Will you please run benefits.   Thank you!

## 2023-03-04 ENCOUNTER — Telehealth: Payer: Self-pay

## 2023-03-04 ENCOUNTER — Telehealth (HOSPITAL_BASED_OUTPATIENT_CLINIC_OR_DEPARTMENT_OTHER): Payer: Self-pay | Admitting: Cardiology

## 2023-03-04 NOTE — Telephone Encounter (Signed)
   Name: Allen [name]  DOB: April 24, 1950  MRN: [id number]  Primary Cardiologist: [name], MD   Preoperative team, please contact this patient and set up a phone call appointment for further preoperative risk assessment. Please obtain consent and complete medication review. Thank you for your help.  I confirm that guidance regarding antiplatelet and oral anticoagulation therapy has been completed and, if necessary, noted below.  Patient is not on any medications to hold.   I also confirmed the patient resides in the state of [place]. As per [facility] Medical Board telemedicine laws, the patient must reside in the state in which the provider is licensed.   Carlos Levering, NP 03/04/2023, 5:02 PM [facility]

## 2023-03-04 NOTE — Telephone Encounter (Signed)
Patient has been scheduled for telephone appt and consent done     Patient Consent for Virtual Visit         Allen [name] has provided verbal consent on 03/04/2023 for a virtual visit (video or telephone).   CONSENT FOR VIRTUAL VISIT FOR:  Allen [name]  By participating in this virtual visit I agree to the following:  I hereby voluntarily request, consent and authorize [facility] and its employed or contracted physicians, physician assistants, nurse practitioners or other licensed health care professionals (the Practitioner), to provide me with telemedicine health care services (the "Services") as deemed necessary by the treating Practitioner. I acknowledge and consent to receive the Services by the Practitioner via telemedicine. I understand that the telemedicine visit will involve communicating with the Practitioner through live audiovisual communication technology and the disclosure of certain medical information by electronic transmission. I acknowledge that I have been given the opportunity to request an in-person assessment or other available alternative prior to the telemedicine visit and am voluntarily participating in the telemedicine visit.  I understand that I have the right to withhold or withdraw my consent to the use of telemedicine in the course of my care at any time, without affecting my right to future care or treatment, and that the Practitioner or I may terminate the telemedicine visit at any time. I understand that I have the right to inspect all information obtained and/or recorded in the course of the telemedicine visit and may receive copies of available information for a reasonable fee.  I understand that some of the potential risks of receiving the Services via telemedicine include:  Delay or interruption in medical evaluation due to technological equipment failure or disruption; Information transmitted may not be sufficient (e.g. poor resolution of images)  to allow for appropriate medical decision making by the Practitioner; and/or  In rare instances, security protocols could fail, causing a breach of personal health information.  Furthermore, I acknowledge that it is my responsibility to provide information about my medical history, conditions and care that is complete and accurate to the best of my ability. I acknowledge that Practitioner's advice, recommendations, and/or decision may be based on factors not within their control, such as incomplete or inaccurate data provided by me or distortions of diagnostic images or specimens that may result from electronic transmissions. I understand that the practice of medicine is not an exact science and that Practitioner makes no warranties or guarantees regarding treatment outcomes. I acknowledge that a copy of this consent can be made available to me via my patient portal [facility] MyChart), or I can request a printed copy by calling the office of [facility].    I understand that my insurance will be billed for this visit.   I have read or had this consent read to me. I understand the contents of this consent, which adequately explains the benefits and risks of the Services being provided via telemedicine.  I have been provided ample opportunity to ask questions regarding this consent and the Services and have had my questions answered to my satisfaction. I give my informed consent for the services to be provided through the use of telemedicine in my medical care

## 2023-03-04 NOTE — Telephone Encounter (Signed)
Medical Buy and Annette Stable   Prior Authorization for [facility] APPROVED PA# [id number] Valid: 02/28/23-08/27/23

## 2023-03-04 NOTE — Telephone Encounter (Signed)
Patient has been scheduled for telephone appt

## 2023-03-04 NOTE — Telephone Encounter (Signed)
   Pre-operative Risk Assessment    Patient Name: Allen [name]  DOB: 11/18/1950 MRN: [id number]   Date of last office visit: 05/18/22 Date of next office visit: Not yet scheduled   Request for Surgical Clearance    Procedure:   Right total knee replacement  Date of Surgery:  Clearance TBD                                Surgeon:  Dr. Jodi Geralds Surgeon's Group or Practice Name:  [facility] Phone number:  [phone] Fax number:  [phone]   Type of Clearance Requested:   - Medical  - Pharmacy:  Hold        Type of Anesthesia:  Spinal   Additional requests/questions:   Caller Raynelle Fanning) stated patient will need medical and pharmacy clearance.  Signed, Annetta Maw   03/04/2023, 9:14 AM

## 2023-03-04 NOTE — Telephone Encounter (Signed)
Holding until needed by patient.  *Please confirm we have stock before scheduling*

## 2023-03-06 ENCOUNTER — Telehealth: Payer: Self-pay | Admitting: Primary Care

## 2023-03-06 NOTE — Telephone Encounter (Signed)
Fax received from Dr. Milly Jakob with [facility] to perform a right total knee arthroplasty under spinal anesthesia on patient.  Patient needs surgery clearance. Surgery is TBD. Patient was seen on 11/21/22. Office protocol is a risk assessment can be sent to surgeon if patient has been seen in 60 days or less.   Sending to [facility] for risk assessment or recommendations if patient needs to be seen in office prior to surgical procedure.

## 2023-03-11 DIAGNOSIS — R972 Elevated prostate specific antigen [PSA]: Secondary | ICD-10-CM | POA: Diagnosis not present

## 2023-03-12 ENCOUNTER — Other Ambulatory Visit: Payer: Self-pay | Admitting: Medical Genetics

## 2023-03-12 LAB — PSA: PSA: 10.2

## 2023-03-13 ENCOUNTER — Telehealth: Payer: Self-pay

## 2023-03-13 NOTE — Telephone Encounter (Signed)
 Dr.'s office now calling on status.   Allen [name] [phone]

## 2023-03-13 NOTE — Telephone Encounter (Signed)
 Form received and placed into Dr Hardie Shackleton folder.

## 2023-03-13 NOTE — Telephone Encounter (Signed)
 Copied from CRM [id number]. Topic: General - Other >> Mar 13, 2023  9:13 AM Kathryne Eriksson wrote: Reason for CRM: Lala Lund >> Mar 13, 2023  9:14 AM Kathryne Eriksson wrote: Lala Lund called on behalf of patient, wanting to know if the clearance document was received and also what is the status.

## 2023-03-15 NOTE — Telephone Encounter (Signed)
 [name], I spoke with the pt and he is having surgery first wk in April  We have no openings sooner  Are you okay to overbook to see him?

## 2023-03-15 NOTE — Telephone Encounter (Signed)
 Spoke with the pt  He prefers to come to [address]  He wanted 04/22/23 and states will call ortho to push the surgery back  Will route back to surgery clearance pool until pt is seen

## 2023-03-15 NOTE — Telephone Encounter (Signed)
 [name], can you please advise on clearance?       Fax received from Dr. Milly Jakob with [facility] to perform a right total knee arthroplasty under spinal anesthesia on patient.  Patient needs surgery clearance. Surgery is TBD. Patient was seen on 11/21/22. Office protocol is a risk assessment can be sent to surgeon if patient has been seen in 60 days or less.    Sending to [facility] for risk assessment or recommendations if patient needs to be seen in office prior to surgical procedure.

## 2023-03-15 NOTE — Telephone Encounter (Signed)
 I have an opening march 25th at 9am it looks like

## 2023-03-15 NOTE — Telephone Encounter (Signed)
 Can not clear without an office visit, looks like he had a flare up of his COPD earlier this month so needs in person evaluation

## 2023-03-18 ENCOUNTER — Ambulatory Visit: Payer: Medicare (Managed Care) | Attending: Cardiology | Admitting: Nurse Practitioner

## 2023-03-18 ENCOUNTER — Other Ambulatory Visit: Payer: Self-pay | Admitting: Urology

## 2023-03-18 DIAGNOSIS — Z01810 Encounter for preprocedural cardiovascular examination: Secondary | ICD-10-CM

## 2023-03-18 DIAGNOSIS — R972 Elevated prostate specific antigen [PSA]: Secondary | ICD-10-CM

## 2023-03-18 NOTE — Progress Notes (Signed)
 Virtual Visit via Telephone Note   Because of Azariel [name] co-morbid illnesses, he is at least at moderate risk for complications without adequate follow up.  This format is felt to be most appropriate for this patient at this time.  Due to technical limitations with video connection (technology), today's appointment will be conducted as an audio only telehealth visit, and [name] verbally agreed to proceed in this manner.   All issues noted in this document were discussed and addressed.  No physical exam could be performed with this format.  Evaluation Performed:  Preoperative cardiovascular risk assessment _____________   Date:  03/18/2023   Patient ID:  Allen [name], DOB 1950-07-06, MRN [id number] Patient Location:  Home Provider location:   Office  Primary Care Provider:  Philip Aspen, [name], MD Primary Cardiologist:  [name], MD  Chief Complaint / Patient Profile   73 y.o. y/o male with a h/o shortness of breath, cardiomegaly noted on CT, lower extremity edema, and varicose veins who is pending right total knee replacement with Dr. Jodi Geralds of [facility] and presents today for telephonic preoperative cardiovascular risk assessment.  History of Present Illness    Allen [name] is a 73 y.o. male who presents via audio/video conferencing for a telehealth visit today.  Pt was last seen in cardiology clinic on 05/18/2022 by Dr. Cristal Deer.  At that time Jrue Jarriel was doing well.  He was cleared for surgery at the time.  The patient is now pending procedure as outlined above. Since his last visit, he has done well from a cardiac standpoint.  He denies chest pain, palpitations, dyspnea, pnd, orthopnea, n, v, dizziness, syncope, edema, weight gain, or early satiety. All other systems reviewed and are otherwise negative except as noted above.   Past Medical History    Past Medical History:  Diagnosis Date   Anxiety    Arthritis    neck -    BPH with obstruction/lower urinary tract symptoms    urologist-- dr pace   Cancer [facility])    basal cell to nose   Centrilobular emphysema (HCC)    followed by dr Craige Cotta   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[facility])    followed by dr Craige Cotta   Cough 07/2020   covid residual, no congestion   DOE (dyspnea on exertion)    due to copd/ emphysema   ED (erectile dysfunction)    Essential tremor    neurologist--- dr tat----  bilateral upper extremities,  s/p DPS 12/ 2016   GERD (gastroesophageal reflux disease)    History of 2019 novel coronavirus disease (COVID-19) 07/18/2020   per pt postive home test 07-18-2020 w/ mild  symptoms per pt; pt stated had urgent care visit 07-24-2020 for sob/ cough with negative cxr (in care everywhere) ;  follow up pcp visit 08-03-2020 in epic   Insomnia    Mixed simple and mucopurulent chronic bronchitis (HCC)    OSA (obstructive sleep apnea)    followed by dr Craige Cotta---- no longer does uses bipap, intolerant --- pt prescriped  Oxygen  2 L oxygen via [place] while sleeping  (study in epic 04/ 2017 AHI 40)   Perennial allergic rhinitis    Pneumonia    Pulmonary nodules    followed by dr Craige Cotta   S/P deep brain stimulator placement 12/2014   followed by neurology-----IPG change 01-21-2020 for end-of life battery  (pt has a control)   Varicose vein of leg    per pt has had 3 procedure's last  one 2006   Wears dentures    upper   Past Surgical History:  Procedure Laterality Date   ANKLE SURGERY Right 2020   tendon repair   APPENDECTOMY     CARPAL TUNNEL RELEASE Left 2019   CATARACT EXTRACTION W/ INTRAOCULAR LENS IMPLANT Bilateral 2020   COLONOSCOPY     CYSTOSCOPY WITH URETHRAL DILATATION N/A 08/29/2021   Procedure: CYSTOSCOPY WITH URETHRAL DILATATION;  Surgeon: Noel Christmas, MD;  [facility];  Service: Urology;  Laterality: N/A;   DEEP BRAIN STIMULATOR PLACEMENT Left 2016   INGUINAL HERNIA REPAIR Right 2007   KNEE ARTHROSCOPY W/ MENISCECTOMY Right 06/25/2022   ROTATOR CUFF  REPAIR Right 2014   SUBTHALAMIC STIMULATOR BATTERY REPLACEMENT N/A 01/21/2020   Procedure: Deep brain stimulator battery replacement;  Surgeon: Maeola Harman, MD;  Location: [facility];  Service: Neurosurgery;  Laterality: N/A;   TONSILLECTOMY AND ADENOIDECTOMY     age 24   TOTAL SHOULDER REPLACEMENT Left 2016   TRANSURETHRAL RESECTION OF PROSTATE N/A 08/09/2020   Procedure: TRANSURETHRAL RESECTION OF THE PROSTATE (TURP), BLADDER BIOPSY AND FULGERATION;  Surgeon: Noel Christmas, MD;  Location: [facility] [place];  Service: Urology;  Laterality: N/A;   TRANSURETHRAL RESECTION OF PROSTATE N/A 08/29/2021   Procedure: RE-TRANSURETHRAL RESECTION OF THE PROSTATE (TURP);  Surgeon: Noel Christmas, MD;  [facility];  Service: Urology;  Laterality: N/A;   VEIN SURGERY Left 2006   varicose veins    Allergies  Allergies  Allergen Reactions   Copper-Containing Compounds     Copper gloves caused redness and blisters bilateral hands    Home Medications    Prior to Admission medications   Medication Sig Start Date End Date Taking? Authorizing Provider  albuterol (VENTOLIN HFA) 108 (90 Base) MCG/ACT inhaler Inhale 1-2 puffs into the lungs every 4 (four) hours as needed for wheezing or shortness of breath. 01/28/23   Glenford Bayley, NP  Azelastine-Fluticasone 137-50 MCG/ACT SUSP Place 1 spray into the nose in the morning and at bedtime. 10/30/22   Philip Aspen, [name], MD  busPIRone (BUSPAR) 30 MG tablet Take 30 mg by mouth at bedtime.    [provider]  carbidopa-levodopa (SINEMET IR) 25-100 MG tablet Take 1 tablet by mouth 3 (three) times daily. Titrating up takes 1/2 tablet 3 times daily 05/29/22 05/29/23  [provider]  Cholecalciferol (VITAMIN D3) 125 MCG (5000 UT) TABS Take 5,000 Units by mouth in the morning.    [provider]  Cyanocobalamin 2500 MCG SUBL Place 5,000 mcg under the tongue daily.    [provider]  escitalopram  (LEXAPRO) 10 MG tablet Take 10 mg by mouth daily. 06/16/22   [provider]  Fluticasone-Umeclidin-Vilant (TRELEGY ELLIPTA) 200-62.5-25 MCG/ACT AEPB Inhale 1 each into the lungs daily. 01/28/23   Glenford Bayley, NP  gabapentin (NEURONTIN) 300 MG capsule Take 1 capsule (300 mg total) by mouth 3 (three) times daily. 02/07/23   Lenn Sink, DPM  HYDROcodone bit-homatropine (HYCODAN) 5-1.5 MG/5ML syrup Take 5 mLs by mouth every 6 (six) hours as needed. 02/27/23   Burchette, Elberta Fortis, MD  montelukast (SINGULAIR) 10 MG tablet Take 1 tablet (10 mg total) by mouth at bedtime. 01/28/23   Glenford Bayley, NP  Multiple Vitamins-Minerals (PRESERVISION AREDS) TABS Take 1 tablet by mouth daily.    [provider]  mupirocin ointment (BACTROBAN) 2 % Apply 1 Application topically 3 (three) times daily as needed.    [provider]  Omega-3 Fatty Acids (FISH OIL PO) Take 2,200 mg by mouth in the morning.    [provider]  pantoprazole (PROTONIX) 40 MG tablet TAKE 1 TABLET (40 MG TOTAL) BY MOUTH TWICE A DAY BEFORE MEALS 02/19/23   Glenford Bayley, NP  Semaglutide-Weight Management (WEGOVY) 0.25 MG/0.5ML SOAJ Inject 0.25 mg into the skin once a week. 09/11/22   Philip Aspen, [name], MD  tadalafil (CIALIS) 20 MG tablet Take 1 tablet (20 mg total) by mouth daily as needed for erectile dysfunction. Patient taking differently: Take 10 mg by mouth daily. 12/31/19   Philip Aspen, [name], MD  tamsulosin (FLOMAX) 0.4 MG CAPS capsule Take 0.4 mg by mouth in the morning.    [provider]  traZODone (DESYREL) 100 MG tablet TAKE 1 TABLET AT BEDTIME 06/08/21   Philip Aspen, [name], MD  triamcinolone (KENALOG) 0.025 % cream Apply 1 Application topically 2 (two) times daily as needed.    [provider]  TURMERIC PO Take 2,250 mg by mouth in the morning.    [provider]  zaleplon (SONATA) 10 MG capsule Take 10 mg by mouth at bedtime. 06/01/21    [provider]    Physical Exam    Vital Signs:  Shaquille Janes does not have vital signs available for review today.  Given telephonic nature of communication, physical exam is limited. AAOx3. NAD. Normal affect.  Speech and respirations are unlabored.  Accessory Clinical Findings    None  Assessment & Plan    1.  Preoperative Cardiovascular Risk Assessment:  According to the Revised Cardiac Risk Index (RCRI), his Perioperative Risk of Major Cardiac Event is (%): 0.4. His Functional Capacity in METs is: 7.01 according to the Duke Activity Status Index (DASI). Therefore, based on ACC/AHA guidelines, patient would be at acceptable risk for the planned procedure without further cardiovascular testing.  The patient was advised that if he develops new symptoms prior to surgery to contact our office to arrange for a follow-up visit, and he verbalized understanding.   A copy of this note will be routed to requesting surgeon.  Time:   Today, I have spent 5 minutes with the patient with telehealth technology discussing medical history, symptoms, and management plan.     Joylene Grapes, NP  03/18/2023, 2:27 PM

## 2023-03-20 ENCOUNTER — Other Ambulatory Visit (HOSPITAL_COMMUNITY): Payer: Self-pay

## 2023-03-22 ENCOUNTER — Encounter: Payer: Self-pay | Admitting: Family Medicine

## 2023-03-22 ENCOUNTER — Ambulatory Visit: Payer: Medicare (Managed Care) | Admitting: Family Medicine

## 2023-03-22 VITALS — BP 110/68 | HR 67 | Temp 98.2°F | Wt 219.0 lb

## 2023-03-22 DIAGNOSIS — G500 Trigeminal neuralgia: Secondary | ICD-10-CM

## 2023-03-22 MED ORDER — METHYLPREDNISOLONE 4 MG PO TBPK: ORAL_TABLET | ORAL | 0 refills | Status: DC

## 2023-03-22 NOTE — Progress Notes (Signed)
   Subjective:    Patient ID: Allen [name], male    DOB: [birth year], 73 y.o.   MRN: [id number]  HPI Here for 4 days of burning and tingling in the right temple and behind the right ear. No rashes. He has had this many times over the years, but it usually stops after a day or two. His skin is quite sensitive in this area.    Review of Systems  Constitutional: Negative.   Respiratory: Negative.    Cardiovascular: Negative.   Neurological:  Negative for dizziness, facial asymmetry, light-headedness and headaches.       Objective:   Physical Exam Constitutional:      Appearance: Normal appearance.  HENT:     Right Ear: Tympanic membrane, ear canal and external ear normal.     Left Ear: Tympanic membrane, ear canal and external ear normal.     Nose: Nose normal.     Mouth/Throat:     Pharynx: Oropharynx is clear.  Eyes:     Conjunctiva/sclera: Conjunctivae normal.  Lymphadenopathy:     Cervical: No cervical adenopathy.  Skin:    Findings: No erythema or rash.     Comments: He is very sensitive to touch over the right temple and behind the right ear   Neurological:     Mental Status: He is alert.           Assessment & Plan:  Trigeminal neuralgia, treat with a Medrol dose pack. Gershon Crane, MD

## 2023-03-26 DIAGNOSIS — M25562 Pain in left knee: Secondary | ICD-10-CM | POA: Diagnosis not present

## 2023-03-27 ENCOUNTER — Other Ambulatory Visit (HOSPITAL_COMMUNITY)
Admission: RE | Admit: 2023-03-27 | Discharge: 2023-03-27 | Disposition: A | Discharge status: Home / Self Care | Payer: Self-pay | Source: Ambulatory Visit | Attending: Medical Genetics | Admitting: Medical Genetics

## 2023-03-29 ENCOUNTER — Ambulatory Visit (HOSPITAL_COMMUNITY)
Admission: RE | Admit: 2023-03-29 | Discharge: 2023-03-29 | Disposition: A | Discharge status: Home / Self Care | Payer: Medicare (Managed Care) | Source: Ambulatory Visit | Attending: Urology | Admitting: Urology

## 2023-03-29 DIAGNOSIS — R972 Elevated prostate specific antigen [PSA]: Secondary | ICD-10-CM | POA: Insufficient documentation

## 2023-03-29 MED ORDER — GADOBUTROL 1 MMOL/ML IV SOLN
10.0000 mL | Freq: Once | INTRAVENOUS | Status: AC | PRN
  Administered 2023-03-29: 10 mL via INTRAVENOUS

## 2023-04-05 LAB — GENECONNECT MOLECULAR SCREEN: Genetic Analysis Overall Interpretation: NEGATIVE

## 2023-04-16 DIAGNOSIS — L821 Other seborrheic keratosis: Secondary | ICD-10-CM | POA: Diagnosis not present

## 2023-04-16 DIAGNOSIS — D225 Melanocytic nevi of trunk: Secondary | ICD-10-CM | POA: Diagnosis not present

## 2023-04-16 DIAGNOSIS — L820 Inflamed seborrheic keratosis: Secondary | ICD-10-CM | POA: Diagnosis not present

## 2023-04-16 DIAGNOSIS — R233 Spontaneous ecchymoses: Secondary | ICD-10-CM | POA: Diagnosis not present

## 2023-04-16 DIAGNOSIS — L570 Actinic keratosis: Secondary | ICD-10-CM | POA: Diagnosis not present

## 2023-04-16 DIAGNOSIS — L814 Other melanin hyperpigmentation: Secondary | ICD-10-CM | POA: Diagnosis not present

## 2023-04-16 DIAGNOSIS — L538 Other specified erythematous conditions: Secondary | ICD-10-CM | POA: Diagnosis not present

## 2023-04-22 ENCOUNTER — Ambulatory Visit: Payer: Medicare (Managed Care) | Admitting: Primary Care

## 2023-04-22 ENCOUNTER — Encounter: Payer: Self-pay | Admitting: Primary Care

## 2023-04-22 VITALS — BP 120/72 | HR 75 | Temp 98.4°F | Ht 70.0 in | Wt 220.0 lb

## 2023-04-22 DIAGNOSIS — J4489 Other specified chronic obstructive pulmonary disease: Secondary | ICD-10-CM

## 2023-04-22 DIAGNOSIS — G4733 Obstructive sleep apnea (adult) (pediatric): Secondary | ICD-10-CM

## 2023-04-22 MED ORDER — TRELEGY ELLIPTA 200-62.5-25 MCG/ACT IN AEPB: 1.0000 | INHALATION_SPRAY | Freq: Every day | RESPIRATORY_TRACT | Status: DC

## 2023-04-22 NOTE — Patient Instructions (Addendum)
 -  SURGICAL RISK ASSESSMENT FOR ACL REPAIR: You have a low pulmonary risk for the ACL repair surgery, and there are no expected complications with anesthesia. However, there are concerns about potential postoperative respiratory issues, pneumonia, and pulmonary embolism. To minimize these risks, you are encouraged to start walking as soon as possible after surgery and to use compression stockings to prevent blood clots. The surgery will be an outpatient procedure, and you should be able to go home the same day.  -CHRONIC OBSTRUCTIVE PULMONARY DISEASE (COPD) WITH EMPHYSEMA AND ASTHMA: COPD is a chronic lung disease that makes it hard to breathe, and emphysema is a type of COPD that involves damage to the air sacs in the lungs. Your condition is currently well-managed with your medication, Trelegy, and your rescue inhaler. Continue taking Trelegy 200 micrograms, one puff daily, and use your rescue inhaler as needed, especially during physical activities.  INSTRUCTIONS: From pulmonary standpoint its ok to proceed with your outpatient ACL repair surgery as planned in June. Remember to start walking as soon as possible after surgery and use compression stockings to prevent blood clots. Continue your current COPD management with Trelegy and your rescue inhaler. If you develop acute respiratory symptoms or bronchitis prior to surgery notify our office.   Follow-up: 6 months with either Dr. Celine Mans or Dr. Francine Graven 30 mins (former Dr. Craige Cotta patient)

## 2023-04-22 NOTE — Progress Notes (Signed)
 @Patient  ID: [name], male    DOB: December 11, 1950, 73 y.o.   MRN: [id number]  Chief Complaint  Patient presents with   Follow-up    Asthma and COPD F/U No longer treats OSA per Dr Craige Cotta    Referring provider: Philip Aspen, [place]*  HPI: 73 year old male, former smoker. PMH significant for CHF, varicose veins, COPD with asthma, emphysema s/p deep brain stimulator placement, mild OSA, pulmonary nodules <4mm, UC, GERD, essential tremor, vit D deficiency. Former Dr. Craige Cotta patient.   04/22/2023 Discussed the use of AI scribe software for clinical note transcription with the patient, who gave verbal consent to proceed.  History of Present Illness   Kristi Hyer is a 73 year old male with COPD and emphysema who presents for a surgical risk assessment for ACL repair.  He has a history of COPD, emphysema, and asthma. He uses Trelegy 200 micrograms, one puff daily, and a rescue inhaler for dyspnea, particularly on exertion such as climbing stairs or inclines. During a recent trip to [place], he experienced shortness of breath. No recent respiratory infections or bronchitis, though he had a cold in January treated with prednisone, which resolved his symptoms. His last CT scan in April 2024 showed stable emphysema with partially resolved pulmonary nodules.  He has a history of sleep apnea, previously severe but now mild following a 60-pound weight loss. He no longer requires CPAP or BiPAP therapy and reports no current sleep disturbances or symptoms of sleep apnea.  He has a deep brain nerve stimulator for tremors, which requires a battery replacement every four years. The current battery is dead, and he notes that the stimulator helps manage his tremor symptoms.  He is preparing for ACL repair surgery, which he has postponed to June due to an upcoming prostate biopsy scheduled for this Friday.      Allergies  Allergen Reactions   Copper-Containing Compounds     Copper gloves caused  redness and blisters bilateral hands    Immunization History  Administered Date(s) Administered   Fluad Quad(high Dose 65+) 09/18/2018, 11/10/2019   Fluad Trivalent(High Dose 65+) 10/22/2022   Influenza, High Dose Seasonal PF 11/01/2020   Influenza-Unspecified 10/06/2021   Moderna Covid-19 Fall Seasonal Vaccine 79yrs & older 10/22/2022   PFIZER Comirnaty(Gray Top)Covid-19 Tri-Sucrose Vaccine 10/06/2021   PFIZER(Purple Top)SARS-COV-2 Vaccination 02/20/2019, 03/17/2019, 06/02/2019, 10/03/2019, 11/01/2020   Pneumococcal Polysaccharide-23 01/08/2015   Pneumococcal-Unspecified 12/15/2017   Tdap 09/17/2016, 04/04/2021   Zoster Recombinant(Shingrix) 06/06/2019, 09/05/2019    Past Medical History:  Diagnosis Date   Anxiety    Arthritis    neck -   BPH with obstruction/lower urinary tract symptoms    urologist-- dr pace   Cancer [facility])    basal cell to nose   Centrilobular emphysema (HCC)    followed by dr Craige Cotta   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(HCC)    followed by dr Craige Cotta   Cough 07/2020   covid residual, no congestion   DOE (dyspnea on exertion)    due to copd/ emphysema   ED (erectile dysfunction)    Essential tremor    neurologist--- dr tat----  bilateral upper extremities,  s/p DPS 12/ 2016   GERD (gastroesophageal reflux disease)    History of 2019 novel coronavirus disease (COVID-19) 07/18/2020   per pt postive home test 07-18-2020 w/ mild  symptoms per pt; pt stated had urgent care visit 07-24-2020 for sob/ cough with negative cxr (in care everywhere) ;  follow up pcp visit 08-03-2020 in epic  Insomnia    Mixed simple and mucopurulent chronic bronchitis (HCC)    OSA (obstructive sleep apnea)    followed by dr Craige Cotta---- no longer does uses bipap, intolerant --- pt prescriped  Oxygen  2 L oxygen via [place] while sleeping  (study in epic 04/ 2017 [facility] 40)   Perennial allergic rhinitis    Pneumonia    Pulmonary nodules    followed by dr Craige Cotta   S/P deep brain stimulator placement 12/2014    followed by neurology-----IPG change 01-21-2020 for end-of life battery  (pt has a control)   Varicose vein of leg    per pt has had 3 procedure's last one 2006   Wears dentures    upper    Tobacco History: Social History   Tobacco Use  Smoking Status Former   Current packs/day: 0.00   Average packs/day: 3.0 packs/day for 20.0 years (60.0 ttl pk-yrs)   Types: Cigarettes   Start date: 2   Quit date: 60   Years since quitting: 34.2  Smokeless Tobacco Never   Counseling given: Not Answered   Outpatient Medications Prior to Visit  Medication Sig Dispense Refill   albuterol (VENTOLIN HFA) 108 (90 Base) MCG/ACT inhaler Inhale 1-2 puffs into the lungs every 4 (four) hours as needed for wheezing or shortness of breath. 54 g 2   Azelastine-Fluticasone 137-50 MCG/ACT SUSP Place 1 spray into the nose in the morning and at bedtime. 23 g 5   busPIRone (BUSPAR) 30 MG tablet Take 30 mg by mouth at bedtime.     carbidopa-levodopa (SINEMET IR) 25-100 MG tablet Take 1 tablet by mouth 3 (three) times daily. Titrating up takes 1/2 tablet 3 times daily     Cholecalciferol (VITAMIN D3) 125 MCG (5000 UT) TABS Take 5,000 Units by mouth in the morning.     Cyanocobalamin 2500 MCG SUBL Place 5,000 mcg under the tongue daily.     escitalopram (LEXAPRO) 10 MG tablet Take 10 mg by mouth daily.     Fluticasone-Umeclidin-Vilant (TRELEGY ELLIPTA) 200-62.5-25 MCG/ACT AEPB Inhale 1 each into the lungs daily. 180 each 3   gabapentin (NEURONTIN) 300 MG capsule Take 1 capsule (300 mg total) by mouth 3 (three) times daily. 90 capsule 3   HYDROcodone bit-homatropine (HYCODAN) 5-1.5 MG/5ML syrup Take 5 mLs by mouth every 6 (six) hours as needed. 120 mL 0   methylPREDNISolone (MEDROL DOSEPAK) 4 MG TBPK tablet As directed 21 tablet 0   montelukast (SINGULAIR) 10 MG tablet Take 1 tablet (10 mg total) by mouth at bedtime. 90 tablet 3   Multiple Vitamins-Minerals (PRESERVISION AREDS) TABS Take 1 tablet by mouth daily.      mupirocin ointment (BACTROBAN) 2 % Apply 1 Application topically 3 (three) times daily as needed.     Omega-3 Fatty Acids (FISH OIL PO) Take 2,200 mg by mouth in the morning.     pantoprazole (PROTONIX) 40 MG tablet TAKE 1 TABLET (40 MG TOTAL) BY MOUTH TWICE A DAY BEFORE MEALS 180 tablet 1   Semaglutide-Weight Management (WEGOVY) 0.25 MG/0.5ML SOAJ Inject 0.25 mg into the skin once a week. 2 mL 0   tadalafil (CIALIS) 20 MG tablet Take 1 tablet (20 mg total) by mouth daily as needed for erectile dysfunction. (Patient taking differently: Take 10 mg by mouth daily.) 90 tablet 3   tamsulosin (FLOMAX) 0.4 MG CAPS capsule Take 0.4 mg by mouth in the morning.     traZODone (DESYREL) 100 MG tablet TAKE 1 TABLET AT BEDTIME 90 tablet  1   triamcinolone (KENALOG) 0.025 % cream Apply 1 Application topically 2 (two) times daily as needed.     TURMERIC PO Take 2,250 mg by mouth in the morning.     zaleplon (SONATA) 10 MG capsule Take 10 mg by mouth at bedtime.     No facility-administered medications prior to visit.   Review of Systems  Review of Systems  Constitutional: Negative.   HENT: Negative.    Respiratory: Negative.  Negative for cough, shortness of breath and wheezing.   Cardiovascular: Negative.    Physical Exam  BP 120/72 (BP Location: Left Arm, Patient Position: Sitting, Cuff Size: Large)   Pulse 75   Temp 98.4 F (36.9 C) (Oral)   Ht 5\' 10"  (1.778 m)   Wt 220 lb (99.8 kg)   SpO2 95%   BMI 31.57 kg/m  Physical Exam Constitutional:      Appearance: Normal appearance.  HENT:     Head: Normocephalic and atraumatic.  Cardiovascular:     Rate and Rhythm: Normal rate and regular rhythm.  Pulmonary:     Effort: Pulmonary effort is normal.     Breath sounds: Normal breath sounds.  Musculoskeletal:        General: Normal range of motion.  Skin:    General: Skin is warm and dry.  Neurological:     General: No focal deficit present.     Mental Status: He is alert and oriented to  person, place, and time. Mental status is at baseline.  Psychiatric:        Mood and Affect: Mood normal.        Behavior: Behavior normal.        Thought Content: Thought content normal.        Judgment: Judgment normal.      Lab Results:  CBC    Component Value Date/Time   WBC 7.1 07/25/2022 1015   RBC 3.98 (L) 07/25/2022 1015   HGB 12.8 (L) 07/25/2022 1015   HCT 39.6 07/25/2022 1015   PLT 315.0 07/25/2022 1015   MCV 99.5 07/25/2022 1015   MCH 31.4 08/22/2021 0906   MCHC 32.3 07/25/2022 1015   RDW 14.3 07/25/2022 1015   LYMPHSABS 1.4 07/25/2022 1015   MONOABS 0.6 07/25/2022 1015   EOSABS 0.2 07/25/2022 1015   BASOSABS 0.0 07/25/2022 1015    BMET    Component Value Date/Time   NA 141 07/25/2022 1015   K 4.4 07/25/2022 1015   CL 103 07/25/2022 1015   CO2 30 07/25/2022 1015   GLUCOSE 100 (H) 07/25/2022 1015   BUN 25 (H) 07/25/2022 1015   CREATININE 1.02 07/25/2022 1015   CALCIUM 9.4 07/25/2022 1015   GFRNONAA >60 08/22/2021 0906    BNP No results found for: "BNP"  ProBNP    Component Value Date/Time   PROBNP 33.0 04/10/2019 0941    Imaging: MR PROSTATE W WO CONTRAST Result Date: 04/04/2023 CLINICAL DATA:  Elevated PSA level.  R97.20 EXAM: MR PROSTATE WITHOUT AND WITH CONTRAST TECHNIQUE: Multiplanar multisequence MRI images were obtained of the pelvis centered about the prostate. Pre and post contrast images were obtained. In order to accommodate the patient's deep brain stimulator, today's exam was performed in 30 minute intervals. Advanced three-dimensional prostate surface reconstruction and three-dimensional lesion characterization was manually performed by the radiology physician on an independent workstation, including slice by slice characterization of all lesions and the prostate capsule from the high-resolution data set, with resulting targeting data sent to the UroNAV targeting  system. CONTRAST:  10mL GADAVIST GADOBUTROL 1 MMOL/ML IV SOLN COMPARISON:   None Available. FINDINGS: Prostate: Prior TURP. Region of interest # 1: Small PI-RADS category 4 lesion of the left anterior peripheral zone at the apex with focally reduced T2 signal (image 15 series 3) corresponding to focal early enhancement (image 142 series 400) as well as reduced ADC map activity (image 10 series 650) and focally restricted diffusion (image 11 series 600). This measures 0.11 cc (0.7 by 0.4 by 0.6 cm). Volume: 3D volumetric assessment: Prostate volume 15.6 cc (4.7 by 2.8 by 2.7 cm). Transcapsular spread: Absent Seminal vesicle involvement: Absent Neurovascular bundle involvement: Absent Pelvic adenopathy: Absent Bone metastasis: Absent Other findings: Sigmoid colon diverticulosis. IMPRESSION: 1. Small PI-RADS category 4 lesion of the left anterior peripheral zone at the apex. Targeting data sent to UroNAV. 2. Prior TURP. 3. Sigmoid colon diverticulosis. Electronically Signed   By: Gaylyn Rong M.D.   On: 04/04/2023 12:41     Assessment & Plan:   1. 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(HCC) (Primary)  2. OSA (obstructive sleep apnea)   Assessment and Plan    Surgical Risk Assessment for ACL Repair Low pulmonary risk for ACL repair surgery. No prior complication with general anesthesia. OSA resolved with weight loss. COPD/asthma currently well controlled on Trelegy. He is optimized for surgery from a pulmonary standpoint. Outpatient procedure with same-day discharge expected. - Encouraged early ambulation post-surgery to prevent complications. - Recommended compression stockings post-surgery to prevent pulmonary embolism. - Proceed with outpatient ACL repair surgery as planned.  Chronic Obstructive Pulmonary Disease (COPD) with Emphysema and Asthma COPD with emphysema and asthma managed with Trelegy . Rescue inhaler used as needed. Recent respiratory infection treated, returned to baseline. CT scans show resolving benign pulmonary nodules. Optimized for surgery. - Continue Trelegy  200 micrograms, one puff daily. - Use rescue inhaler as needed, especially during physical exertion.     OSA: -Hx severe OSA on BIPAP, resolved with 60 lbs weight loss     1) RISK FOR PROLONGED MECHANICAL VENTILAION - > 48h  1A) Arozullah - Prolonged mech ventilation risk Arozullah Postperative Pulmonary Risk Score - for mech ventilation dependence >48h USAA, Ann Surg 2000, major non-cardiac surgery) Comment Score  Type of surgery - abd ao aneurysm (27), thoracic (21), neurosurgery / upper abdominal / vascular (21), neck (11) Knee surgery  6  Emergency Surgery - (11)  0  ALbumin < 3 or poor nutritional state - (9)  0  BUN > 30 -  (8)  0  Partial or completely dependent functional status - (7)  0  COPD -  (6)  6  Age - 60 to 69 (4), > 70  (6)  6  TOTAL  18  Risk Stratifcation scores  - < 10 (0.5%), 11-19 (1.8%), 20-27 (4.2%), 28-40 (10.1%), >40 (26.6%)  1.8% risk prolonged mech ventiltion       1B) GUPTA - Prolonged Mech Vent Risk Score source Risk  Guptal post op prolonged mech ventilation > 48h or reintubation < 30 days - ACS 2007-2008 dataset - [email or web address] 0.3 % Risk of mechanical ventilation for >48 hrs after surgery, or unplanned intubation <=30 days of surgery     2) RISK FOR POST OP PNEUMONIA Score source Risk  Chales Abrahams - Post Op Pnemounia risk  [email or web address] 0.5 % Risk of postoperative pneumonia    R3) ISK FOR ANY POST-OP PULMONARY COMPLICATION Score source Risk  CANET/ARISCAT Score - risk for ANY/ALl pulmonary complications - > risk of in-hospital  post-op pulmonary complications (composite including respiratory failure, respiratory infection, pleural effusion, atelectasis, pneumothorax, bronchospasm, aspiration pneumonitis) [employer] - based on age, anemia, pulse ox, resp infection prior 30d,  incision site, duration of surgery, and emergency v elective surgery Low risk 1.6% risk of in-hospital post-op pulmonary complications (composite including respiratory failure, respiratory infection, pleural effusion, atelectasis, pneumothorax, bronchospasm, aspiration pneumonitis)     Major Pulmonary risks identified in the multifactorial risk analysis are but not limited to a) pneumonia; b) recurrent intubation risk; c) prolonged or recurrent acute respiratory failure needing mechanical ventilation; d) prolonged hospitalization; e) DVT/Pulmonary embolism; f) Acute Pulmonary edema  Recommend 1. Short duration of surgery as much as possible and avoid paralytic if possible 2. Recovery in step down or ICU with Pulmonary consultation if needed  3. DVT prophylaxis 4. Aggressive pulmonary toilet with o2, bronchodilatation, and incentive spirometry and early ambulation   Glenford Bayley, NP 04/22/2023

## 2023-04-23 NOTE — Telephone Encounter (Signed)
 [name]'s note from 04/22/23 was faxed to [facility]

## 2023-04-26 ENCOUNTER — Emergency Department (HOSPITAL_COMMUNITY)
Admission: EM | Admit: 2023-04-26 | Discharge: 2023-04-26 | Disposition: A | Discharge status: Home / Self Care | Payer: Medicare (Managed Care)

## 2023-04-26 ENCOUNTER — Other Ambulatory Visit: Payer: Self-pay

## 2023-04-26 ENCOUNTER — Encounter (HOSPITAL_COMMUNITY): Payer: Self-pay | Admitting: Emergency Medicine

## 2023-04-26 DIAGNOSIS — Z87891 Personal history of nicotine dependence: Secondary | ICD-10-CM | POA: Diagnosis not present

## 2023-04-26 DIAGNOSIS — Z7951 Long term (current) use of inhaled steroids: Secondary | ICD-10-CM | POA: Insufficient documentation

## 2023-04-26 DIAGNOSIS — J449 Chronic obstructive pulmonary disease, unspecified: Secondary | ICD-10-CM | POA: Diagnosis not present

## 2023-04-26 DIAGNOSIS — R339 Retention of urine, unspecified: Secondary | ICD-10-CM | POA: Diagnosis not present

## 2023-04-26 DIAGNOSIS — J45909 Unspecified asthma, uncomplicated: Secondary | ICD-10-CM | POA: Diagnosis not present

## 2023-04-26 DIAGNOSIS — Z85828 Personal history of other malignant neoplasm of skin: Secondary | ICD-10-CM | POA: Insufficient documentation

## 2023-04-26 LAB — BASIC METABOLIC PANEL WITH GFR
Anion gap: 10 (ref 5–15)
BUN: 20 mg/dL (ref 8–23)
CO2: 20 mmol/L — ABNORMAL LOW (ref 22–32)
Calcium: 8.8 mg/dL — ABNORMAL LOW (ref 8.9–10.3)
Chloride: 100 mmol/L (ref 98–111)
Creatinine, Ser: 1.04 mg/dL (ref 0.61–1.24)
GFR, Estimated: >60 mL/min (ref >60)
Glucose, Bld: 86 mg/dL (ref 70–99)
Potassium: 4 mmol/L (ref 3.5–5.1)
Sodium: 130 mmol/L — ABNORMAL LOW (ref 135–145)

## 2023-04-26 LAB — URINALYSIS, ROUTINE W REFLEX MICROSCOPIC
Bilirubin Urine: NEGATIVE
Glucose, UA: NEGATIVE mg/dL
Ketones, ur: NEGATIVE mg/dL
Leukocytes,Ua: NEGATIVE
Nitrite: NEGATIVE
Protein, ur: 100 mg/dL — AB
RBC / HPF: >50 RBC/hpf (ref 0–5)
Specific Gravity, Urine: 1.008 (ref 1.005–1.030)
pH: 6 (ref 5.0–8.0)

## 2023-04-26 LAB — CBC
HCT: 40.6 % (ref 39.0–52.0)
Hemoglobin: 13.2 g/dL (ref 13.0–17.0)
MCH: 32.3 pg (ref 26.0–34.0)
MCHC: 32.5 g/dL (ref 30.0–36.0)
MCV: 99.3 fL (ref 80.0–100.0)
Platelets: 322 10*3/uL (ref 150–400)
RBC: 4.09 MIL/uL — ABNORMAL LOW (ref 4.22–5.81)
RDW: 14.7 % (ref 11.5–15.5)
WBC: 7.8 10*3/uL (ref 4.0–10.5)
nRBC: 0 % (ref 0.0–0.2)

## 2023-04-26 MED ORDER — PHENAZOPYRIDINE HCL 200 MG PO TABS: 200.0000 mg | ORAL_TABLET | Freq: Three times a day (TID) | ORAL | 0 refills | Status: DC

## 2023-04-26 NOTE — Consult Note (Addendum)
 I have been asked to see the patient by Dr. Beckey Downing, for evaluation and management of urinary tension gross hematuria.[name] Kitchen  History of present illness: 73 year old male with history of elevated PSA 2 prior negative biopsies on tadalafil has a history of BPH with LUTS status post TURP 2022 and had residual resection in 2023 he continues to be on Flomax.  He subsequently had an MRI due to rising PSA and it demonstrated a small PI-RADS 4 lesion in the lung in the left anterior peripheral zone.  He underwent biopsy on 4/11.  Postoperatively he had gross hematuria as well as urinary retention.  Came to the ER where catheter was placed urine remained clear after catheter placement.  I was in the ER so I came to see him reviewed catheter demonstrated was clear.  Patient denies any nausea vomiting fevers or chills.  Is having some stinging with urination.  Overtly does not appear to be septic.  WBC within normal limits, creatinine within normal it is vitals within normal limits no concern for infection.  Review of systems: A 12 point comprehensive review of systems was obtained and is negative unless otherwise stated in the history of present illness.  Patient Active Problem List   Diagnosis Date Noted   Cervical spondylosis 05/29/2022   Mild obstructive sleep apnea 05/29/2022   Effusion of left knee 04/18/2022   Degenerative arthritis of left knee 03/28/2022   Hoarseness 01/18/2022   Laryngopharyngeal reflux (LPR) 01/18/2022   MCI (mild cognitive impairment) with memory loss 09/29/2021   Chronic respiratory failure with hypoxia (HCC) 09/01/2020   BPH with obstruction/lower urinary tract symptoms 08/09/2020   S/P deep brain stimulator placement 03/25/2020   Tremor 03/25/2020   Pulmonary nodule less than 6 mm in diameter with high risk for malignant neoplasm 03/09/2020   Injury of axilla 03/03/2020   Body mass index (BMI) 32.0-32.9, adult 02/22/2020   Cervical radiculopathy 02/22/2020   Elevated  blood-pressure reading, without diagnosis of hypertension 02/22/2020   Neck pain 02/22/2020   Greater trochanteric bursitis of right hip 11/18/2019   Degenerative disc disease, cervical 07/01/2019   Degenerative disc disease, lumbar 07/01/2019   Nonallopathic lesion of lumbar region 07/01/2019   Varicose veins of both lower extremities 06/23/2019   Chronic diastolic CHF (congestive heart failure) (HCC) 06/02/2019   Perennial allergic rhinitis probable nonallergic component 06/01/2019   Cough, persistent 06/01/2019   Vitamin D deficiency 05/07/2019   Moderate persistent asthma without complication 04/10/2019   Allergic rhinitis 04/10/2019   Shortness of breath 04/10/2019   GERD (gastroesophageal reflux disease)    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(HCC)    Right carpal tunnel syndrome    Essential tremor    Insomnia    ED (erectile dysfunction)    Ulcerative colitis (HCC) 06/30/2014    No current facility-administered medications on file prior to encounter.   Current Outpatient Medications on File Prior to Encounter  Medication Sig Dispense Refill   albuterol (VENTOLIN HFA) 108 (90 Base) MCG/ACT inhaler Inhale 1-2 puffs into the lungs every 4 (four) hours as needed for wheezing or shortness of breath. 54 g 2   Azelastine-Fluticasone 137-50 MCG/ACT SUSP Place 1 spray into the nose in the morning and at bedtime. 23 g 5   busPIRone (BUSPAR) 30 MG tablet Take 30 mg by mouth at bedtime.     carbidopa-levodopa (SINEMET IR) 25-100 MG tablet Take 1 tablet by mouth 3 (three) times daily. Titrating up takes 1/2 tablet 3 times daily  Cholecalciferol (VITAMIN D3) 125 MCG (5000 UT) TABS Take 5,000 Units by mouth in the morning.     Cyanocobalamin 2500 MCG SUBL Place 5,000 mcg under the tongue daily.     escitalopram (LEXAPRO) 10 MG tablet Take 10 mg by mouth daily.     Fluticasone-Umeclidin-Vilant (TRELEGY ELLIPTA) 200-62.5-25 MCG/ACT AEPB Inhale 1 each into the lungs daily. 180 each 3    Fluticasone-Umeclidin-Vilant (TRELEGY ELLIPTA) 200-62.5-25 MCG/ACT AEPB Inhale 1 puff into the lungs daily.     gabapentin (NEURONTIN) 300 MG capsule Take 1 capsule (300 mg total) by mouth 3 (three) times daily. 90 capsule 3   HYDROcodone bit-homatropine (HYCODAN) 5-1.5 MG/5ML syrup Take 5 mLs by mouth every 6 (six) hours as needed. 120 mL 0   methylPREDNISolone (MEDROL DOSEPAK) 4 MG TBPK tablet As directed 21 tablet 0   montelukast (SINGULAIR) 10 MG tablet Take 1 tablet (10 mg total) by mouth at bedtime. 90 tablet 3   Multiple Vitamins-Minerals (PRESERVISION AREDS) TABS Take 1 tablet by mouth daily.     mupirocin ointment (BACTROBAN) 2 % Apply 1 Application topically 3 (three) times daily as needed.     Omega-3 Fatty Acids (FISH OIL PO) Take 2,200 mg by mouth in the morning.     pantoprazole (PROTONIX) 40 MG tablet TAKE 1 TABLET (40 MG TOTAL) BY MOUTH TWICE A DAY BEFORE MEALS 180 tablet 1   Semaglutide-Weight Management (WEGOVY) 0.25 MG/0.5ML SOAJ Inject 0.25 mg into the skin once a week. 2 mL 0   tadalafil (CIALIS) 20 MG tablet Take 1 tablet (20 mg total) by mouth daily as needed for erectile dysfunction. (Patient taking differently: Take 10 mg by mouth daily.) 90 tablet 3   tamsulosin (FLOMAX) 0.4 MG CAPS capsule Take 0.4 mg by mouth in the morning.     traZODone (DESYREL) 100 MG tablet TAKE 1 TABLET AT BEDTIME 90 tablet 1   triamcinolone (KENALOG) 0.025 % cream Apply 1 Application topically 2 (two) times daily as needed.     TURMERIC PO Take 2,250 mg by mouth in the morning.     zaleplon (SONATA) 10 MG capsule Take 10 mg by mouth at bedtime.      Past Medical History:  Diagnosis Date   Anxiety    Arthritis    neck -   BPH with obstruction/lower urinary tract symptoms    urologist-- dr pace   Cancer [facility])    basal cell to nose   Centrilobular emphysema (HCC)    followed by dr Craige Cotta   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[facility])    followed by dr Craige Cotta   Cough 07/2020   covid residual, no congestion   DOE  (dyspnea on exertion)    due to copd/ emphysema   ED (erectile dysfunction)    Essential tremor    neurologist--- dr tat----  bilateral upper extremities,  s/p DPS 12/ 2016   GERD (gastroesophageal reflux disease)    History of 2019 novel coronavirus disease (COVID-19) 07/18/2020   per pt postive home test 07-18-2020 w/ mild  symptoms per pt; pt stated had urgent care visit 07-24-2020 for sob/ cough with negative cxr (in care everywhere) ;  follow up pcp visit 08-03-2020 in epic   Insomnia    Mixed simple and mucopurulent chronic bronchitis (HCC)    OSA (obstructive sleep apnea)    followed by dr Craige Cotta---- no longer does uses bipap, intolerant --- pt prescriped  Oxygen  2 L oxygen via [place] while sleeping  (study in epic 04/ 2017  AHI 40)   Perennial allergic rhinitis    Pneumonia    Pulmonary nodules    followed by dr Craige Cotta   S/P deep brain stimulator placement 12/2014   followed by neurology-----IPG change 01-21-2020 for end-of life battery  (pt has a control)   Varicose vein of leg    per pt has had 3 procedure's last one 2006   Wears dentures    upper    Past Surgical History:  Procedure Laterality Date   ANKLE SURGERY Right 2020   tendon repair   APPENDECTOMY     CARPAL TUNNEL RELEASE Left 2019   CATARACT EXTRACTION W/ INTRAOCULAR LENS IMPLANT Bilateral 2020   COLONOSCOPY     CYSTOSCOPY WITH URETHRAL DILATATION N/A 08/29/2021   Procedure: CYSTOSCOPY WITH URETHRAL DILATATION;  Surgeon: Noel Christmas, MD;  Location: [facility];  Service: Urology;  Laterality: N/A;   DEEP BRAIN STIMULATOR PLACEMENT Left 2016   INGUINAL HERNIA REPAIR Right 2007   KNEE ARTHROSCOPY W/ MENISCECTOMY Right 06/25/2022   ROTATOR CUFF REPAIR Right 2014   SUBTHALAMIC STIMULATOR BATTERY REPLACEMENT N/A 01/21/2020   Procedure: Deep brain stimulator battery replacement;  Surgeon: Maeola Harman, MD;  Location: [facility];  Service: Neurosurgery;  Laterality: N/A;   TONSILLECTOMY AND ADENOIDECTOMY     age 41    TOTAL SHOULDER REPLACEMENT Left 2016   TRANSURETHRAL RESECTION OF PROSTATE N/A 08/09/2020   Procedure: TRANSURETHRAL RESECTION OF THE PROSTATE (TURP), BLADDER BIOPSY AND FULGERATION;  Surgeon: Noel Christmas, MD;  Location: [facility];  Service: Urology;  Laterality: N/A;   TRANSURETHRAL RESECTION OF PROSTATE N/A 08/29/2021   Procedure: RE-TRANSURETHRAL RESECTION OF THE PROSTATE (TURP);  Surgeon: Noel Christmas, MD;  Location: [facility];  Service: Urology;  Laterality: N/A;   VEIN SURGERY Left 2006   varicose veins    Social History   Tobacco Use   Smoking status: Former    Current packs/day: 0.00    Average packs/day: 3.0 packs/day for 20.0 years (60.0 ttl pk-yrs)    Types: Cigarettes    Start date: 59    Quit date: 72    Years since quitting: 34.2   Smokeless tobacco: Never  Vaping Use   Vaping status: Never Used  Substance Use Topics   Alcohol use: Yes    Alcohol/week: 1.0 standard drink of alcohol    Types: 1 Glasses of wine per week    Comment: occassionally   Drug use: Never    Family History  Problem Relation Age of Onset   COPD Mother    Asthma Father    Rectal cancer Sister    Healthy Child    Urticaria Neg Hx    Immunodeficiency Neg Hx    Eczema Neg Hx    Atopy Neg Hx    Angioedema Neg Hx    Allergic rhinitis Neg Hx    Colon cancer Neg Hx    Stomach cancer Neg Hx    Esophageal cancer Neg Hx     PE: Vitals:   04/26/23 1728 04/26/23 2000  BP: (!) 141/88 125/71  Pulse: 77 77  Resp: 18 18  Temp: 97.7 F (36.5 C)   SpO2: 98% 96%   Patient appears to be in no acute distress  patient is alert and oriented x3 Abdomen: Abdomen soft no rebound tenderness GU: Catheter in place CBI attached not running urine clear.  Recent Labs    04/26/23 1750  WBC 7.8  HGB 13.2  HCT 40.6   Recent  Labs    04/26/23 1750  NA 130*  K 4.0  CL 100  CO2 20*  GLUCOSE 86  BUN 20  CREATININE 1.04  CALCIUM 8.8*   No results for input(s):  "LABPT", "INR" in the last 72 hours. No results for input(s): "LABURIN" in the last 72 hours. Results for orders placed or performed during the hospital encounter of 01/21/20  SARS Coronavirus 2 by RT PCR (hospital order, performed in [facility] hospital lab) Nasopharyngeal Nasopharyngeal Swab     Status: None   Collection Time: 01/21/20  2:10 PM   Specimen: Nasopharyngeal Swab  Result Value Ref Range Status   SARS Coronavirus 2 NEGATIVE NEGATIVE Final    Comment: (NOTE) SARS-CoV-2 target nucleic acids are NOT DETECTED.  The SARS-CoV-2 RNA is generally detectable in upper and lower respiratory specimens during the acute phase of infection. The lowest concentration of SARS-CoV-2 viral copies this assay can detect is 250 copies / mL. A negative result does not preclude SARS-CoV-2 infection and should not be used as the sole basis for treatment or other patient management decisions.  A negative result may occur with improper specimen collection / handling, submission of specimen other than nasopharyngeal swab, presence of viral mutation(s) within the areas targeted by this assay, and inadequate number of viral copies (<250 copies / mL). A negative result must be combined with clinical observations, patient history, and epidemiological information.  Fact Sheet for Patients:   BoilerBrush.com.cy  Fact Sheet for Healthcare Providers: https://pope.com/  This test is not yet approved or  cleared by the [place] FDA and has been authorized for detection and/or diagnosis of SARS-CoV-2 by FDA under an Emergency Use Authorization (EUA).  This EUA will remain in effect (meaning this test can be used) for the duration of the COVID-19 declaration under Section 564(b)(1) of the Act, 21 U.S.C. section 360bbb-3(b)(1), unless the authorization is terminated or revoked sooner.  Performed at [facility], [address] [place],  [place] [address]     Imaging: None   Imp: 73 year old male with history of elevated PSA status post prostate biopsy on [address] after the biopsy with gross hematuria and urinary tension catheter placed gross materia resolved.  Patient continues to be on Flomax at home.  Will discharge home with Pyridium urine culture per ED.  Patient will follow-up next week for void trial Dr. Arita Miss has been contacted.  Based on labs vitals and impression there is no concern for sepsis in this patient.   Thank you for involving me in this patient's care, I will continue to follow along.Please page with any further questions or concerns. Allen [name]

## 2023-04-26 NOTE — Discharge Instructions (Signed)
 Please continue the Flomax and take the Pyridium as prescribed.  Please follow-up with your urologist next week.  A culture has been sent.  If this is growing anything you should receive a call in the next few days.  Follow-up with your urologist and return to the ER for worsening symptoms.

## 2023-04-26 NOTE — ED Triage Notes (Signed)
 Patient presents post biopsy this AM. Since then he has been unable to urinate. He has noticed a small amount of blood.

## 2023-04-26 NOTE — ED Provider Notes (Signed)
 [place] EMERGENCY DEPARTMENT AT [facility] Provider Note   CSN: [id number] Arrival date & time: 04/26/23  1701     History  Chief Complaint  Patient presents with   Urinary Retention    Allen [name] is a 73 y.o. male.  72 year old male with past medical history of BPH who is not on any blood thinner medications presenting to the emergency department today with gross hematuria and now inability to urinate.  The patient had a prostate biopsy this morning and noted that he was having a lot of blood when he got home when he was urinating.  He states that a couple hours ago he tried to urinate was unable to.  Reports that he is having lot of pressure since then.  He is not on any blood thinners.  Denies any fevers.  He came to the ER at that time for further evaluation after calling his urologist.        Home Medications Prior to Admission medications   Medication Sig Start Date End Date Taking? Authorizing Provider  phenazopyridine (PYRIDIUM) 200 MG tablet Take 1 tablet (200 mg total) by mouth 3 (three) times daily. 04/26/23  Yes Durwin Glaze, MD  albuterol (VENTOLIN HFA) 108 (90 Base) MCG/ACT inhaler Inhale 1-2 puffs into the lungs every 4 (four) hours as needed for wheezing or shortness of breath. 01/28/23   Glenford Bayley, NP  Azelastine-Fluticasone 137-50 MCG/ACT SUSP Place 1 spray into the nose in the morning and at bedtime. 10/30/22   Philip Aspen, [name], MD  busPIRone (BUSPAR) 30 MG tablet Take 30 mg by mouth at bedtime.    [provider]  carbidopa-levodopa (SINEMET IR) 25-100 MG tablet Take 1 tablet by mouth 3 (three) times daily. Titrating up takes 1/2 tablet 3 times daily 05/29/22 05/29/23  [provider]  Cholecalciferol (VITAMIN D3) 125 MCG (5000 UT) TABS Take 5,000 Units by mouth in the morning.    [provider]  Cyanocobalamin 2500 MCG SUBL Place 5,000 mcg under the tongue daily.    [provider]   escitalopram (LEXAPRO) 10 MG tablet Take 10 mg by mouth daily. 06/16/22   [provider]  Fluticasone-Umeclidin-Vilant (TRELEGY ELLIPTA) 200-62.5-25 MCG/ACT AEPB Inhale 1 each into the lungs daily. 01/28/23   Glenford Bayley, NP  Fluticasone-Umeclidin-Vilant (TRELEGY ELLIPTA) 200-62.5-25 MCG/ACT AEPB Inhale 1 puff into the lungs daily. 04/22/23   Glenford Bayley, NP  gabapentin (NEURONTIN) 300 MG capsule Take 1 capsule (300 mg total) by mouth 3 (three) times daily. 02/07/23   Lenn Sink, DPM  HYDROcodone bit-homatropine (HYCODAN) 5-1.5 MG/5ML syrup Take 5 mLs by mouth every 6 (six) hours as needed. 02/27/23   Burchette, Elberta Fortis, MD  methylPREDNISolone (MEDROL DOSEPAK) 4 MG TBPK tablet As directed 03/22/23   Nelwyn Salisbury, MD  montelukast (SINGULAIR) 10 MG tablet Take 1 tablet (10 mg total) by mouth at bedtime. 01/28/23   Glenford Bayley, NP  Multiple Vitamins-Minerals (PRESERVISION AREDS) TABS Take 1 tablet by mouth daily.    [provider]  mupirocin ointment (BACTROBAN) 2 % Apply 1 Application topically 3 (three) times daily as needed.    [provider]  Omega-3 Fatty Acids (FISH OIL PO) Take 2,200 mg by mouth in the morning.    [provider]  pantoprazole (PROTONIX) 40 MG tablet TAKE 1 TABLET (40 MG TOTAL) BY MOUTH TWICE A DAY BEFORE MEALS 02/19/23   Glenford Bayley, NP  Semaglutide-Weight Management [facility]) 0.25  MG/0.5ML SOAJ Inject 0.25 mg into the skin once a week. 09/11/22   Philip Aspen, [name], MD  tadalafil (CIALIS) 20 MG tablet Take 1 tablet (20 mg total) by mouth daily as needed for erectile dysfunction. Patient taking differently: Take 10 mg by mouth daily. 12/31/19   Philip Aspen, [name], MD  tamsulosin (FLOMAX) 0.4 MG CAPS capsule Take 0.4 mg by mouth in the morning.    [provider]  traZODone (DESYREL) 100 MG tablet TAKE 1 TABLET AT BEDTIME 06/08/21   Philip Aspen, [name], MD  triamcinolone (KENALOG) 0.025 %  cream Apply 1 Application topically 2 (two) times daily as needed.    [provider]  TURMERIC PO Take 2,250 mg by mouth in the morning.    [provider]  zaleplon (SONATA) 10 MG capsule Take 10 mg by mouth at bedtime. 06/01/21   [provider]      Allergies    Copper-containing compounds    Review of Systems   Review of Systems  Genitourinary:  Positive for difficulty urinating.  All other systems reviewed and are negative.   Physical Exam Updated Vital Signs BP 120/68   Pulse 75   Temp 98.1 F (36.7 C) (Oral)   Resp 14   SpO2 96%  Physical Exam Vitals and nursing note reviewed.   Gen: Appears uncomfortable Eyes: PERRL, EOMI HEENT: no oropharyngeal swelling Neck: trachea midline Resp: clear to auscultation bilaterally Card: RRR, no murmurs, rubs, or gallops Abd: Mild suprapubic tenderness with no guarding or rebound Extremities: no calf tenderness, no edema Vascular: 2+ radial pulses bilaterally, 2+ DP pulses bilaterally Skin: no rashes Psyc: acting appropriately   ED Results / Procedures / Treatments   Labs (all labs ordered are listed, but only abnormal results are displayed) Labs Reviewed  URINALYSIS, ROUTINE W REFLEX MICROSCOPIC - Abnormal; Notable for the following components:      Result Value   Color, Urine RED (*)    APPearance HAZY (*)    Hgb urine dipstick MODERATE (*)    Protein, ur 100 (*)    Bacteria, UA RARE (*)    All other components within normal limits  CBC - Abnormal; Notable for the following components:   RBC 4.09 (*)    All other components within normal limits  BASIC METABOLIC PANEL WITH GFR - Abnormal; Notable for the following components:   Sodium 130 (*)    CO2 20 (*)    Calcium 8.8 (*)    All other components within normal limits  URINE CULTURE    EKG None  Radiology No results found.  Procedures Procedures    Medications Ordered in ED Medications - No data to display  ED Course/  Medical Decision Making/ A&P                                 Medical Decision Making 73 year old male presents the emergency department today with acute urinary retention after prostate biopsy and hematuria this morning.  Will have a three-way catheter placed here in the event the patient does require bladder irrigation.  Also obtain basic labs to evaluate for anemia and renal dysfunction.  Will reevaluate for ultimate disposition after discussion with urology.  The patient's work appears reassuring.  He was evaluated by urology and they recommend discharge with outpatient follow-up.  He is discharged with return precautions.  Amount and/or Complexity of Data Reviewed Labs: ordered.  Risk Prescription drug management.           Final Clinical Impression(s) / ED Diagnoses Final diagnoses:  Urinary retention    Rx / DC Orders ED Discharge Orders          Ordered    phenazopyridine (PYRIDIUM) 200 MG tablet  3 times daily        04/26/23 2057              Durwin Glaze, MD 04/26/23 2150

## 2023-04-27 LAB — URINE CULTURE: Culture: NO GROWTH

## 2023-04-28 ENCOUNTER — Encounter: Payer: Self-pay | Admitting: Internal Medicine

## 2023-04-29 ENCOUNTER — Telehealth: Payer: Self-pay | Admitting: Internal Medicine

## 2023-04-29 NOTE — Telephone Encounter (Signed)
 Agent is aware md out of the office until 05-07-2022

## 2023-04-29 NOTE — Telephone Encounter (Signed)
 Copied from CRM [id number]. Topic: Medical Record Request - Provider/Facility Request >> Apr 29, 2023 10:07 AM Alyse July wrote: Reason for CRM: Marily Shows from [facility] is request medical clearance document for patient and most recent office notes and labs be faxed to [phone] attention to [place].

## 2023-05-01 NOTE — Telephone Encounter (Signed)
 Have not yet received fax

## 2023-05-02 NOTE — Progress Notes (Signed)
 Subjective:   HPI:  Allen [name] is a 73 y.o. right-handed male with essential tremor s/p L VIM DBS, COPD, hx of OSA now improved, urinary dysfunction who presents for subspecialty evaluation of tremors and DBS management. Accompanied by wife who helps provide the history.   Summary of personal review of outside records from Dr. Evonnie: He last saw her 01/2022 and has ET and is s/p Medtronic DBS at L VIM. This was placed in 2016 and he has since had IPG changed in 2022. He takes primidone  150/50mg . He cannot take beta blocker due to asthma and he has also tried gabapentin . He does not want a R DBS but the left hand tremor is worsening. Of note he had memory issues and was found to have low B12 271 so started supplements. MRI brain was unremarkable.  Today Allen [name] reports he had his initial DBS placement at [facility] in [place]  in 2016.  He is unable to remember the surgeon's name.  They only proceeded with unilateral left VIM placement at that time with a left IPG.  He does continue to have a left arm tremor but at this time is not interested in proceeding with second side DBS.  He also was recently diagnosed with prostate cancer 2 days ago and are in the process of discussing robotic surgery for prostatectomy. Denies headaches, dizziness, nausea, vomiting, changes in vision, hearing loss, changes in balance or gait, weakness, numbness, tingling, changes in dexterity, foot drop.  he is accompanied by his wife Allen [name]) for today's visit.   Patient is presenting for consent signing for DBS Phase III, left Activa [place] to Percept RC IPG replacement on 05/09/2023 by Dr. Rosine. I have discussed all risks, benefits, and alternatives, with both the patient and family and they have elected to proceed with surgery. Informed consent has been obtained.   DBS interrogation: LEFT IPG- 2.56v ERI  The following portions of the patient's history were reviewed and updated as appropriate: allergies,  current medications, past family history, past medical history, past social history, past surgical history and problem list.  Review of Systems:   Review of Systems  Constitutional:  Negative for activity change and appetite change.  Eyes:  Negative for visual disturbance.  Gastrointestinal:  Negative for nausea and vomiting.  Musculoskeletal:  Negative for arthralgias, back pain, gait problem, myalgias, neck pain and neck stiffness.  Skin:  Negative for wound.  Neurological:  Positive for tremors. Negative for dizziness, seizures, syncope, facial asymmetry, speech difficulty, weakness, light-headedness, numbness and headaches.  Psychiatric/Behavioral:  Negative for agitation, behavioral problems, confusion and decreased concentration.     Objective:  Physical Exam: Neurological Exam Mental Status Awake, alert and oriented to person, place and time. Oriented to person, place and time. Recent and remote memory are intact. Speech is normal. Language is fluent with no aphasia. Attention and concentration are normal. Fund of knowledge is appropriate for level of education.  Cranial Nerves CN II: Tested with correction. Visual acuity is normal. Right normal visual field. Left normal visual field. CN III, IV, VI: Extraocular movements intact bilaterally. Pupils equal round and reactive to light bilaterally. CN V: Facial sensation is normal. CN VII: Full and symmetric facial movement. CN VIII: Hearing is normal. CN XI: Shoulder shrug strength is normal. CN XII: Tongue midline without atrophy or fasciculations.  Motor Normal muscle bulk throughout. Normal muscle tone. Strength is 5/5 throughout all four extremities.  Sensory Sensation is intact to light touch, pinprick, vibration and proprioception  in all four extremities.  Coordination Right: Finger-to-nose normal.Left: Finger-to-nose abnormality: Right arm:  No tremor noted   Left arm: Grade 1/2 postural tremor.  Gait Normal  casual, toe, heel and tandem gait.  Physical Exam Vitals reviewed.  Constitutional:      Appearance: Normal appearance.  HENT:     Head: Normocephalic.  Eyes:     Extraocular Movements: Extraocular movements intact.     Pupils: Pupils are equal, round, and reactive to light.  Cardiovascular:     Rate and Rhythm: Normal rate.  Pulmonary:     Effort: Pulmonary effort is normal.  Abdominal:     General: Abdomen is flat.     Palpations: Abdomen is soft.  Musculoskeletal:        General: Normal range of motion.     Cervical back: Normal range of motion.  Skin:    General: Skin is warm and dry.     Comments: Well healed DBS incisions.   Neurological:     General: No focal deficit present.     Motor: Motor strength is normal. Psychiatric:        Mood and Affect: Mood normal.        Speech: Speech normal.        Behavior: Behavior normal.        Thought Content: Thought content normal.        Judgment: Judgment normal.      Vitals:  Vitals:   05/03/23 0932  BP: 123/72  Pulse: 73  Temp: 97.3 F (36.3 C)  SpO2: 98%      Assessment     Patient is presenting for consent signing for DBS Phase III, left Activa [place] to Percept RC IPG replacement on 05/09/2023 by Dr. Rosine. I have discussed all risks, benefits, and alternatives, with both the patient and family and they have elected to proceed with surgery.   Plan     Informed consent has been obtained. Surgery is to proceed as scheduled.   I spent a total of 30 minutes in face-to-face and non-face-to-face activities for this patient on the day of the visit. Professional time spent includes the following activities: reviewing previous medical and surgical records, evaluating the patient's current symptoms/allergies/medications, completing the physical examination, ordering tests and medications and documenting in the electronic medical record. This was independent of the time spent by the physician.  Electronically signed by:   Katelyn Welch, PA-C, 05/03/2023 11:16 AM

## 2023-05-03 DIAGNOSIS — Z4542 Encounter for adjustment and management of neuropacemaker (brain) (peripheral nerve) (spinal cord): Secondary | ICD-10-CM | POA: Diagnosis not present

## 2023-05-03 DIAGNOSIS — J449 Chronic obstructive pulmonary disease, unspecified: Secondary | ICD-10-CM | POA: Diagnosis not present

## 2023-05-03 DIAGNOSIS — G4733 Obstructive sleep apnea (adult) (pediatric): Secondary | ICD-10-CM | POA: Diagnosis not present

## 2023-05-03 DIAGNOSIS — G250 Essential tremor: Secondary | ICD-10-CM | POA: Diagnosis not present

## 2023-05-03 DIAGNOSIS — Z9689 Presence of other specified functional implants: Secondary | ICD-10-CM | POA: Diagnosis not present

## 2023-05-03 NOTE — Unmapped External Note (Signed)
 PREOPERATIVE OPTIMIZATION EVALUATION Encounter Date: 05/03/23   PROCEDURE PLANNED   Procedure Information     Date/Time: 05/09/23 9354   Procedure: DBS Phase III (Left: Chest) - DBS Phase III, left Activa [place] to Percept RC IPG replacement Medtronic   Location: [facility] OR 16 / [facility] OR   Surgeons: Yancy Ryan Fare, MD       HPI: Allen [name] 73 y.o. is an ASA 3, 101 kg, Body mass index is 31.82 kg/m.who presents today for medical optimization and to ensure stabilization, of the following sPMH: COPD, OSA, MCI, essential tremor, prostate cancer,  s/p MDT DBS here for anesthesia consult for the above procedure.    ASSESSMENT AND PLAN  Anesthesia History: Pt  denies history of AC or PONV, Denies family history of AC. 08/29/2021: TURP- LMA  Past Surgical History:  Procedure Laterality Date  . APPENDECTOMY     Procedure: APPENDECTOMY  . FOOT SURGERY     Procedure: FOOT SURGERY  . HAND SURGERY     Procedure: HAND SURGERY  . KNEE SURGERY Left   . REPLACE / REVISE VAGAL NERVE STIMULATOR     Procedure: DEEP BRAIN STIMULATOR ELECTRODE REVISION  . SHOULDER SURGERY     Procedure: SHOULDER SURGERY    Assessment of Medical Problems: --------------------------------------------------------------------------------------------------  CARDIAC:   BP Readings from Last 3 Encounters:  05/03/23 126/74  05/03/23 123/72  02/19/23 114/65     --HLD: Continue statin    TTE 05/01/2022:  1. Left ventricular ejection fraction, by estimation, is 50 to 55%. The left ventricle has low normal function. The left ventricle has no regional wall motion abnormalities. There is mild left ventricular hypertrophy. Left ventricular diastolic  parameters are consistent with Grade I diastolic dysfunction (impaired relaxation).   2. Right ventricular systolic function is normal. The right ventricular size is normal. There is normal pulmonary artery systolic pressure. The estimated right ventricular systolic  pressure is 29.7 mmHg.   3. Left atrial size was mild to moderately dilated.   4. The mitral valve is normal in structure. Trivial mitral valve regurgitation. No evidence of mitral stenosis.   5. The aortic valve is tricuspid. Aortic valve regurgitation is not visualized. No aortic stenosis is present.   6. The inferior vena cava is normal in size with <50% respiratory variability, suggesting right atrial pressure of 8 mmHg.   EKG 05/18/22: sinus bradycardia at 57 bpm, rsr'   [facility] Activity Status Index: DASI Total Score: 36.7  DASI METs: 7.25  -------------------------------------------------------------------------------------------------- RESPIRATORY: --COPD/ asthma: stable, w/o recent flares. Followed by pulm. Last seen 04/22/2023 --Hx of severe OSA- reports resolved after 60 lb weight loss  -------------------------------------------------------------------------------------------------- NEURO: -- Essential tremor: reason for surgery  -- MCI: 2/2 low b12 - resolved  -------------------------------------------------------------------------------------------------- MSK: --Cervical DDD:  No hx of fusion   Cervical XR 2023 IMPRESSION:  Multilevel cervical spondylosis with spinal and foraminal stenosis.  No significant change from the prior MRI 03/21/2020  -------------------------------------------------------------------------------------------------- GU: -- Prostate Cancer: Recently diagnosed - planned for surgery in the future  --------------------------------------------------------------------------------------------------  GI: --GERD:  Stable, Continue PPI on DOS -- LRP- Intermittent hoarsness at baseline  --------------------------------------------------------------------------------------------------  HEMATOLOGY/ ONCOLOGY --Anemia:   Lab Results  Component Value Date   HGB 13.0 (L) 01/22/2022   -- Hyponatremia: Mild Na 130-    -------------------------------------------------------------------------------------------------- 04/26/2023 CBC Specimen: Blood - Venous structure (body structure)  Ref Range & Units 7 d ago Comments  WBC 4.0 - 10.5 K/uL 7.8   RBC 4.22 - 5.81 MIL/uL 4.09 Low  Hemoglobin 13.0 - 17.0 g/dL 86.7   HCT 60.9 - 47.9 % 40.6   MCV 80.0 - 100.0 fL 99.3   MCH 26.0 - 34.0 pg 32.3   MCHC 30.0 - 36.0 g/dL 67.4   RDW 88.4 - 84.4 % 14.7   Platelets 150 - 400 K/uL 322   nRBC 0.0 - 0.2 % 0    Basic metabolic panel Specimen: Blood - Venous structure (body structure)  Ref Range & Units 7 d ago Comments  Sodium 135 - 145 mmol/L 130 Low    Potassium 3.5 - 5.1 mmol/L 4   Chloride 98 - 111 mmol/L 100   CO2 22 - 32 mmol/L 20 Low    Glucose, Bld 70 - 99 mg/dL 86 Glucose reference range applies only to samples taken after fasting for at least 8 hours.  BUN 8 - 23 mg/dL 20   Creatinine, Ser 9.38 - 1.24 mg/dL 8.95   Calcium  8.9 - 10.3 mg/dL 8.8 Low    GFR, Estimated >60 mL/min >60 (NOTE) Calculated using the CKD-EPI Creatinine Equation (2021)  Anion gap 5 - 15 10 Performed at [facility], [address] [place], [place] [address]  Resulting Agency  [facility] <redacted file path>   Specimen Collected: 04/26/23 17:50   Performed by: [facility] <redacted file path> Last Resulted: 04/26/23 18:23  Received From: [facility]  Result Received: 04/29/23 10:58     Diagnostics Review: BP Readings from Last 3 Encounters:  05/03/23 126/74  05/03/23 123/72  02/19/23 114/65    Wt Readings from Last 3 Encounters:  05/03/23 101 kg (221 lb 12.8 oz)  05/03/23 101 kg (222 lb)  02/19/23 95.3 kg (210 lb)     Labs: Lab Results  Component Value Date   WBC 5.8 01/22/2022   HGB 13.0 (L) 01/22/2022   HCT 39.7 (L) 01/22/2022   PLT 311 01/22/2022   No results found for: NA, K, CL, CO2, CREATININE, GLU, CALCIUM , PROT, ALBUMIN ,  BILITOT, AST, ALT No components found for: HBA1C No results found for: INR No results found for: TSH    For pt's complete list of medication instructions for the DOS please see below.   Anesthesia Plan  Plan ASA score: 3   Informed Consent Anesthetic plan and risks discussed with patient. Use of blood products discussed with patient whom consented to blood products.      HISTORY/ROS/OBJECTIVE   Patient Active Problem List   Diagnosis Date Noted  Date Diagnosed  . Otalgia of left ear 07/24/2022   . 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(CMD) 05/29/2022     Overview Note:    Last Assessment & Plan:   Formatting of this note is different from the original.  Resolved mild exacerbation. Clinically improved and back to baseline. Compensated on current regimen. Provided with z pack and prednisone  taper to take on his trip to [place]. He understands not to use these unless he develops a flare, which he knows the s/s of. Side effect profile reviewed. He will continue on triple therapy regimen and singulair  for trigger prevention. Action plan in place.     Patient Instructions   Continue Azelastine /fluticasone  1 spray each nostril Twice daily   Continue saline nasal rinses 1-2 times a day for nasal congestion. Do this 20-30 min before your nasal spray   Continue claritin 1 tab daily    Continue Trelegy 1 puff daily. Brush tongue and rinse mouth afterwards  Continue montelukast  1  tab daily   Continue omeprazole  1 capsule Twice daily for reflux    I sent a z pack and prednisone  burst for you to have on hand when you go to [place]    Follow up in 3 months with Dr. Shellia or Izetta Malachy PIETY. If symptoms do not improve or worsen, please contact office for sooner follow up or seek emergency care.   . Essential tremor 05/29/2022     Overview Note:    Formatting of this note might be different from the original.  with nerve stimulator   . Mild obstructive sleep apnea 05/29/2022     Overview Note:     Last Assessment & Plan:   Formatting of this note might be different from the original.  Mild OSA 04/2021. He is down 40 lb since his study. Symptoms have resolved. He will continue to monitor symptoms and work on maintaining healthy weight lose.   . Degenerative arthritis of left knee 03/28/2022     Overview Note:    Last Assessment & Plan:   Formatting of this note might be different from the original.  Patient was told from an outside facility that he did have some degeneration of the left knee that was fairly severe.  Given an injection yesterday by another provider.  Still has some instability with valgus and varus force.  Hinged brace given today.  Patient has done well and lost weight and is continuing to do so so would wait to make any custom brace while patient continues with the weight loss.  Discussed with patient about other treatment options including the potential for viscosupplementation.  Increase activity slowly otherwise.  Follow-up again in 6 to 8 weeks   . MCI (mild cognitive impairment) with memory loss 09/29/2021   . S/P deep brain stimulator placement 03/25/2020     Resolved Problems   Diagnosis Date Noted Date Resolved Date Diagnosed  . Cervical spondylosis 05/29/2022 05/03/2023   . GERD (gastroesophageal reflux disease) 05/29/2022 05/03/2023     Overview Note:    Last Assessment & Plan:   Formatting of this note might be different from the original.  Well-controlled on PPI regimen.   . Insomnia 05/29/2022 05/03/2023   . Laryngopharyngeal reflux (LPR) 01/18/2022 05/03/2023   . Hoarseness 01/18/2022 05/03/2023   . BPH with obstruction/lower urinary tract symptoms 08/09/2020 05/03/2023   . Varicose veins of both lower extremities 06/23/2019 05/03/2023   . Chronic diastolic CHF (congestive heart failure)    (CMD) 06/02/2019 05/03/2023     Overview Note:    Last Assessment & Plan:   Formatting of this note might be different from the original.  Euvolemic on exam.  Follow up with cardiology as scheduled   . Other allergic rhinitis 06/01/2019 05/03/2023     Overview Note:    Last Assessment & Plan:   Formatting of this note might be different from the original.   Aeroallergen avoidance measures have been discussed and provided in written form.   A prescription has been provided for azelastine /fluticasone  nasal spray, 1 spray per nostril twice daily as needed. Proper nasal spray technique has been discussed and demonstrated.   Nasal saline lavage (NeilMed) has been recommended as needed and prior to medicated nasal sprays along with instructions for proper administration.   For thick post nasal drainage, add guaifenesin 1200 mg (Mucinex Maximum Strength)  twice daily as needed with adequate hydration as discussed.   . Vitamin D  deficiency 05/07/2019 05/03/2023       Past  Medical History:  Diagnosis Date  . COPD (chronic obstructive pulmonary disease)    (CMD)   . GERD (gastroesophageal reflux disease)   . Varicose vein of leg     Social History   Socioeconomic History  . Marital status: Married    Spouse name: Not on file  . Number of children: Not on file  . Years of education: Not on file  . Highest education level: Not on file  Occupational History  . Not on file  Tobacco Use  . Smoking status: Former  . Smokeless tobacco: Never  Vaping Use  . Vaping status: Never Used  Substance and Sexual Activity  . Alcohol use: Yes  . Drug use: Never  . Sexual activity: Yes    Partners: Female  Other Topics Concern  . Not on file  Social History Narrative  . Not on file   Social Drivers of Health   Food Insecurity: No Food Insecurity (07/24/2022)   Received from [facility]   Food vital sign   . Within the past 12 months, you worried that your food would run out before you got money to buy more: Never true   . Within the past 12 months, the food you bought just didn't last and you didn't have money to get more: Never true   Transportation Needs: No Transportation Needs (03/20/2021)   Received from [facility] [place]   [facility] - Transportation   . Lack of Transportation (Medical): No   . Lack of Transportation (Non-Medical): No  Safety: Not At Risk (07/24/2022)   Received from [facility]   Safety   . Within the last year, have you been afraid of your partner or ex-partner?: No   . Within the last year, have you been humiliated or emotionally abused in other ways by your partner or ex-partner?: No   . Within the last year, have you been kicked, hit, slapped, or otherwise physically hurt by your partner or ex-partner?: No   . Within the last year, have you been raped or forced to have any kind of sexual activity by your partner or ex-partner?: No  Living Situation: Not on file    Family History  Problem Relation Name Age of Onset  . Tremor Maternal Grandmother       ROS: Review of Systems  Cardiovascular:  Negative for chest pain, near-syncope, orthopnea, palpitations and syncope.  Hematologic/Lymphatic: Bruises/bleeds easily.  Genitourinary:  Negative for dysuria.  Neurological:  Positive for tremors.  Psychiatric/Behavioral:  The patient is nervous/anxious.   All other systems reviewed and are negative.     Physical Exam:   Physical Exam  Airway  Mallampati: III TM Distance (FB): 3 Oral Aperture (FB): 3 Face: large tongue Neck ROM: full ROM Cardiovascular - normal exam Dental  upper dentures Comments: Lower implants  Pulmonary - normal exam      Current Outpatient Medications:  .  albuterol  HFA (PROVENTIL  HFA;VENTOLIN  HFA;PROAIR  HFA) 90 mcg/actuation inhaler, Inhale every 4 (four) hours as needed for wheezing., Disp: , Rfl:  .  azelastine  (ASTELIN ) 137 mcg (0.1 %) nasal spray, Administer 1 spray into each nostril 2 (two) times a day as needed for allergies., Disp: , Rfl:  .  azelastine -fluticasone  (DYMISTA ) 137-50 mcg/spray spry nasal spray, Administer 1 spray into affected  nostril(s)., Disp: , Rfl:  .  busPIRone  (BUSPAR ) 30 mg tablet, Take 30 mg by mouth daily., Disp: , Rfl:  .  carbidopa -levodopa  (SINEMET ) 25-100 mg per tablet, Take 1 tablet by  mouth 3 (three) times a day., Disp: 90 tablet, Rfl: 11 .  cholecalciferol (VITAMIN D3) 5,000 unit (125 mcg) tab tablet, Take 5,000 Units by mouth daily., Disp: , Rfl:  .  cyanocobalamin  (VITAMIN B12) 2,500 mcg SL tablet, Place 2,500 mcg under the tongue daily., Disp: , Rfl:  .  escitalopram  (LEXAPRO ) 20 mg tablet, Take 20 mg by mouth daily., Disp: , Rfl:  .  fluticasone  propionate (FLONASE ) 50 mcg/spray nasal spray, Administer 2 sprays into each nostril daily., Disp: , Rfl:  .  fluticasone -umeclidinium-vilanterol (Trelegy Ellipta ) 200-62.5-25 mcg dsdv inhaler, Inhale 1 puff Once Daily., Disp: , Rfl:  .  montelukast  (SINGULAIR ) 10 mg tablet, Take 10 mg by mouth at bedtime., Disp: , Rfl:  .  multivit with minerals/lutein (MULTIVITAMIN 50 PLUS ORAL), Take 1 tablet by mouth daily., Disp: , Rfl:  .  omega-3 fatty acids/fish oil (FISH OIL OMEGA 3-6-9 ORAL), Take 1 capsule by mouth daily., Disp: , Rfl:  .  tamsulosin  (FLOMAX ) 0.4 mg cap, Take 0.4 mg by mouth daily., Disp: , Rfl:  .  traZODone  (DESYREL ) 100 mg tablet, Take 100 mg by mouth at bedtime., Disp: , Rfl:  .  TURMERIC ORAL, Take 1 each by mouth daily., Disp: , Rfl:  .  vitamins A,C,E-zinc -copper (I-VITE PROTECT) 2,148 mcg-113 mg-45 mg-17.4mg  tab, Take 1 tablet by mouth daily., Disp: , Rfl:  .  zaleplon (SONATA) 10 mg cap capsule, Take 10 mg by mouth at bedtime., Disp: , Rfl:   Allergies  Allergen Reactions  . Copper     Copper gloves caused redness and blisters bilateral hands      PAC Attestation: Disposition: I have completed the preoperative examination and reviewed pending studies. At this time, this patient is medically prepared for the planned procedure and can proceed.  Anesthesia was discussed with notations that the final disposition regarding anesthetic  management will be addressed by the anesthesiologist on the day of surgery.   I have reviewed the patient's presurgical instructions and preop medication instructions. The patient verbalized understanding of this information. Please refer to the surgeon's clinic notes for documentation of the reason for surgery, details of the surgical pathology, and consents related to the procedure.  Patient instructed to bring photo id the day of surgery, remain NPO after 11PM except for clear liquids 2 hours prior to arrival time, and to call for interval change mandating surgery cancellation.   Electronically signed by: Deitra Stephane Lunger, NP, 05/03/2023 10:47 AM   Supervised by Dr. Pennye     -- Addendum for Additional Evaluation, Management, and Time ---  During the course of this preoperative evaluation, additional time and effort was required that extends beyond a routine presurgical assessment.  The assessment and plan that is detailed above which  identifies the extenuating medical or social circumstances that required  further diagnostic evaluation, planning  and active management in order to optimize this patient prior to surgery.  I have personally spent 30 minutes involved in face-to-face and non-face-to-face activities for this patient on the day of the visit.  Professional time spent includes the following activities, in addition to those noted in the documentation: preparing to see the patient by review of history and previous tests, obtaining and reviewing separately obtained history, and performing medically appropriate examination and evaluation.

## 2023-05-03 NOTE — H&P (Signed)
 Department of Neurosurgery Physician Assistant Pre-operative History and Physical  DIAGNOSIS:  Depleted LEFT IPG  INDICATION:  Depleted LEFT IPG  PROCEDURE:   DBS Phase III, left Activa [place] to Percept RC IPG replacement on 05/09/2023 by Dr. Rosine.   HISTORY OF PRESENT ILLNESS:    Allen [name] is a 73 y.o. right-handed male with essential tremor s/p L VIM DBS, COPD, hx of OSA now improved, urinary dysfunction who presents for subspecialty evaluation of tremors and DBS management. Accompanied by wife who helps provide the history.   Summary of personal review of outside records from Dr. Evonnie: He last saw her 01/2022 and has ET and is s/p [employer] DBS at L VIM. This was placed in 2016 and he has since had IPG changed in 2022. He takes primidone  150/50mg . He cannot take beta blocker due to asthma and he has also tried gabapentin . He does not want a R DBS but the left hand tremor is worsening. Of note he had memory issues and was found to have low B12 271 so started supplements. MRI brain was unremarkable.   Today [name] reports he had his initial DBS placement at [facility] in [place]  in 2016.  He is unable to remember the surgeon's name.  They only proceeded with unilateral left VIM placement at that time with a left IPG.  He does continue to have a left arm tremor but at this time is not interested in proceeding with second side DBS.  He also was recently diagnosed with prostate cancer 2 days ago and are in the process of discussing robotic surgery for prostatectomy. Denies headaches, dizziness, nausea, vomiting, changes in vision, hearing loss, changes in balance or gait, weakness, numbness, tingling, changes in dexterity, foot drop.  he is accompanied by his wife Allen [name]) for today's visit.    Patient is presenting for consent signing for DBS Phase III, left Activa [place] to Percept RC IPG replacement on 05/09/2023 by Dr. Rosine. I have discussed all risks, benefits, and  alternatives, with both the patient and family and they have elected to proceed with surgery. Informed consent has been obtained.    DBS interrogation: LEFT IPG- 2.56v ERI   The following portions of the patient's history were reviewed and updated as appropriate: allergies, current medications, past family history, past medical history, past social history, past surgical history and problem list.    Past Medical History:   Past Medical History:  Diagnosis Date  . COPD (chronic obstructive pulmonary disease)    (CMD)   . GERD (gastroesophageal reflux disease)   . Varicose vein of leg     Past Surgical History:   Past Surgical History:  Procedure Laterality Date  . APPENDECTOMY     Procedure: APPENDECTOMY  . FOOT SURGERY     Procedure: FOOT SURGERY  . HAND SURGERY     Procedure: HAND SURGERY  . KNEE SURGERY Left   . REPLACE / REVISE VAGAL NERVE STIMULATOR     Procedure: DEEP BRAIN STIMULATOR ELECTRODE REVISION  . SHOULDER SURGERY     Procedure: SHOULDER SURGERY    Medications Prior to Admission:   Current Outpatient Medications:  .  albuterol  HFA (PROVENTIL  HFA;VENTOLIN  HFA;PROAIR  HFA) 90 mcg/actuation inhaler, Inhale every 4 (four) hours as needed for wheezing., Disp: , Rfl:  .  azelastine  (ASTELIN ) 137 mcg (0.1 %) nasal spray, Administer 1 spray into each nostril 2 (two) times a day as needed for allergies., Disp: , Rfl:  .  azelastine -fluticasone  (DYMISTA ) 137-50  mcg/spray spry nasal spray, Administer 1 spray into affected nostril(s)., Disp: , Rfl:  .  busPIRone  (BUSPAR ) 30 mg tablet, Take 30 mg by mouth daily., Disp: , Rfl:  .  carbidopa -levodopa  (SINEMET ) 25-100 mg per tablet, Take 1 tablet by mouth 3 (three) times a day., Disp: 90 tablet, Rfl: 11 .  cholecalciferol (VITAMIN D3) 5,000 unit (125 mcg) tab tablet, Take 5,000 Units by mouth daily., Disp: , Rfl:  .  cyanocobalamin  (VITAMIN B12) 2,500 mcg SL tablet, Place 2,500 mcg under the tongue daily., Disp: , Rfl:  .   escitalopram  (LEXAPRO ) 20 mg tablet, Take 20 mg by mouth daily., Disp: , Rfl:  .  fluticasone  propionate (FLONASE ) 50 mcg/spray nasal spray, Administer 2 sprays into each nostril daily., Disp: , Rfl:  .  fluticasone -umeclidinium-vilanterol (Trelegy Ellipta ) 200-62.5-25 mcg dsdv inhaler, Inhale 1 puff Once Daily., Disp: , Rfl:  .  montelukast  (SINGULAIR ) 10 mg tablet, Take 10 mg by mouth at bedtime., Disp: , Rfl:  .  multivit with minerals/lutein (MULTIVITAMIN 50 PLUS ORAL), Take 1 tablet by mouth daily., Disp: , Rfl:  .  omega-3 fatty acids/fish oil (FISH OIL OMEGA 3-6-9 ORAL), Take 1 capsule by mouth daily., Disp: , Rfl:  .  tamsulosin  (FLOMAX ) 0.4 mg cap, Take 0.4 mg by mouth daily., Disp: , Rfl:  .  traZODone  (DESYREL ) 100 mg tablet, Take 100 mg by mouth at bedtime., Disp: , Rfl:  .  TURMERIC ORAL, Take 1 each by mouth daily., Disp: , Rfl:  .  vitamins A,C,E-zinc -copper (I-VITE PROTECT) 2,148 mcg-113 mg-45 mg-17.4mg  tab, Take 1 tablet by mouth daily., Disp: , Rfl:  .  zaleplon (SONATA) 10 mg cap capsule, Take 10 mg by mouth at bedtime., Disp: , Rfl:   Allergies:   Allergies  Allergen Reactions  . Copper     Copper gloves caused redness and blisters bilateral hands    History of allergic reaction to anesthesia:  YES/NO: No  Social History:  Social History   Socioeconomic History  . Marital status: Married    Spouse name: Not on file  . Number of children: Not on file  . Years of education: Not on file  . Highest education level: Not on file  Occupational History  . Not on file  Tobacco Use  . Smoking status: Former  . Smokeless tobacco: Never  Vaping Use  . Vaping status: Never Used  Substance and Sexual Activity  . Alcohol use: Yes  . Drug use: Never  . Sexual activity: Yes    Partners: Female  Other Topics Concern  . Not on file  Social History Narrative  . Not on file   Social Drivers of Health   Food Insecurity: No Food Insecurity (07/24/2022)   Received from [facility]   Food vital sign   . Within the past 12 months, you worried that your food would run out before you got money to buy more: Never true   . Within the past 12 months, the food you bought just didn't last and you didn't have money to get more: Never true  Transportation Needs: No Transportation Needs (03/20/2021)   Received from [facility] [place]   [facility] - Transportation   . Lack of Transportation (Medical): No   . Lack of Transportation (Non-Medical): No  Safety: Not At Risk (07/24/2022)   Received from [facility]   Safety   . Within the last year, have you been afraid of your partner or ex-partner?: No   .  Within the last year, have you been humiliated or emotionally abused in other ways by your partner or ex-partner?: No   . Within the last year, have you been kicked, hit, slapped, or otherwise physically hurt by your partner or ex-partner?: No   . Within the last year, have you been raped or forced to have any kind of sexual activity by your partner or ex-partner?: No  Living Situation: Not on file    Family History:  Family History  Problem Relation Name Age of Onset  . Tremor Maternal Grandmother      REVIEW OF SYSTEMS: Constitutional:  Negative for activity change and appetite change.  Eyes:  Negative for visual disturbance.  Gastrointestinal:  Negative for nausea and vomiting.  Musculoskeletal:  Negative for arthralgias, back pain, gait problem, myalgias, neck pain and neck stiffness.  Skin:  Negative for wound.  Neurological:  Positive for tremors. Negative for dizziness, seizures, syncope, facial asymmetry, speech difficulty, weakness, light-headedness, numbness and headaches.  Psychiatric/Behavioral:  Negative for agitation, behavioral problems, confusion and decreased concentration.      PHYSICAL EXAM: Physical Exam: Neurological Exam Mental Status Awake, alert and oriented to person, place and time. Oriented to person, place and time. Recent and remote  memory are intact. Speech is normal. Language is fluent with no aphasia. Attention and concentration are normal. Fund of knowledge is appropriate for level of education.   Cranial Nerves CN II: Tested with correction. Visual acuity is normal. Right normal visual field. Left normal visual field. CN III, IV, VI: Extraocular movements intact bilaterally. Pupils equal round and reactive to light bilaterally. CN V: Facial sensation is normal. CN VII: Full and symmetric facial movement. CN VIII: Hearing is normal. CN XI: Shoulder shrug strength is normal. CN XII: Tongue midline without atrophy or fasciculations.   Motor Normal muscle bulk throughout. Normal muscle tone. Strength is 5/5 throughout all four extremities.   Sensory Sensation is intact to light touch, pinprick, vibration and proprioception in all four extremities.   Coordination Right: Finger-to-nose normal.Left: Finger-to-nose abnormality: Right arm:  No tremor noted     Left arm: Grade 1/2 postural tremor.   Gait Normal casual, toe, heel and tandem gait.   Physical Exam Vitals reviewed.  Constitutional:      Appearance: Normal appearance.  HENT:     Head: Normocephalic.  Eyes:     Extraocular Movements: Extraocular movements intact.     Pupils: Pupils are equal, round, and reactive to light.  Cardiovascular:     Rate and Rhythm: Normal rate.  Pulmonary:     Effort: Pulmonary effort is normal.  Abdominal:     General: Abdomen is flat.     Palpations: Abdomen is soft.  Musculoskeletal:        General: Normal range of motion.     Cervical back: Normal range of motion.  Skin:    General: Skin is warm and dry.     Comments: Well healed DBS incisions.   Neurological:     General: No focal deficit present.     Motor: Motor strength is normal. Psychiatric:        Mood and Affect: Mood normal.        Speech: Speech normal.        Behavior: Behavior normal.        Thought Content: Thought content normal.         Judgment: Judgment normal.          Vitals:  Vitals:    05/03/23 0932  BP: 123/72  Pulse: 73  Temp: 97.3 F (36.3 C)  SpO2: 98%        ASSESSMENT AND PLAN:  1.  Patient is a 73 y.o. male with above specified procedure planned 05/09/2023 moderate sedation / analgesia (conscious sedation) 2.  Procedure options, risks and benefits reviewed with Patient and family.  Patient and family expresses understanding.

## 2023-05-06 ENCOUNTER — Telehealth: Payer: Self-pay

## 2023-05-06 NOTE — Telephone Encounter (Signed)
 Copied from CRM [id number]. Topic: Medical Record Request - Provider/Facility Request >> Apr 29, 2023 10:07 AM Alyse July wrote: Reason for CRM: Marily Shows from [facility] is request medical clearance document for patient and most recent office notes and labs be faxed to [phone] attention to [facility]. >> May 06, 2023 11:20 AM Allyne Areola wrote: Marily Shows from [facility] is calling to follow up on the medical clearance document for patient and most recent office notes and labs. They need this before scheduling patient for surgery.

## 2023-05-07 NOTE — Telephone Encounter (Signed)
 We have not received the surgical clearance form from [facility].

## 2023-05-09 DIAGNOSIS — Z4542 Encounter for adjustment and management of neuropacemaker (brain) (peripheral nerve) (spinal cord): Secondary | ICD-10-CM | POA: Diagnosis not present

## 2023-05-09 DIAGNOSIS — Z7951 Long term (current) use of inhaled steroids: Secondary | ICD-10-CM | POA: Diagnosis not present

## 2023-05-09 DIAGNOSIS — Z87891 Personal history of nicotine dependence: Secondary | ICD-10-CM | POA: Diagnosis not present

## 2023-05-09 DIAGNOSIS — G250 Essential tremor: Secondary | ICD-10-CM | POA: Diagnosis not present

## 2023-05-09 DIAGNOSIS — J449 Chronic obstructive pulmonary disease, unspecified: Secondary | ICD-10-CM | POA: Diagnosis not present

## 2023-05-09 DIAGNOSIS — Z79899 Other long term (current) drug therapy: Secondary | ICD-10-CM | POA: Diagnosis not present

## 2023-05-09 DIAGNOSIS — K219 Gastro-esophageal reflux disease without esophagitis: Secondary | ICD-10-CM | POA: Diagnosis not present

## 2023-05-12 IMAGING — XA DG MYELOGRAPHY LUMBAR INJ LUMBOSACRAL
13 of 17 series · 13 of 17 positions shown · non-contrast
Comparison: Lumbar spine radiographs 07/01/2019. CT abdomen and
pelvis 02/28/2021.

CLINICAL DATA: Low back pain. Numbness in the fourth and fifth toes
of the left foot.
TECHNIQUE: Contiguous axial images were obtained through the Lumbar spine after
the intrathecal infusion of contrast. Coronal and sagittal
reconstructions were obtained of the axial image sets.

[Series 1: w lumbar spine lat · 0.15mm/px · 1 of 1 slices shown]
[im 1/1]
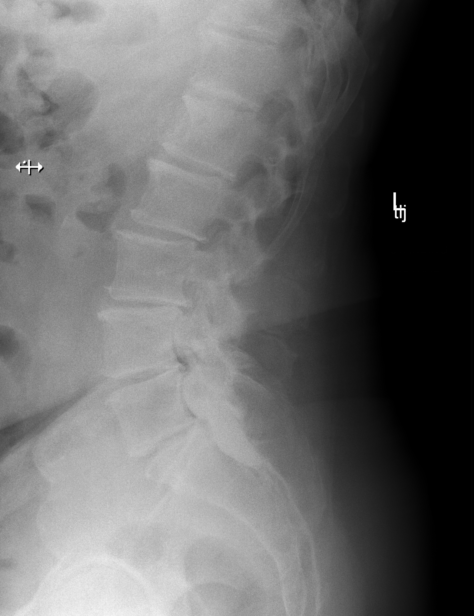

[Series 1: vasc adipose · 1 of 1 slices shown (1 of 11)]
[im 1/1]
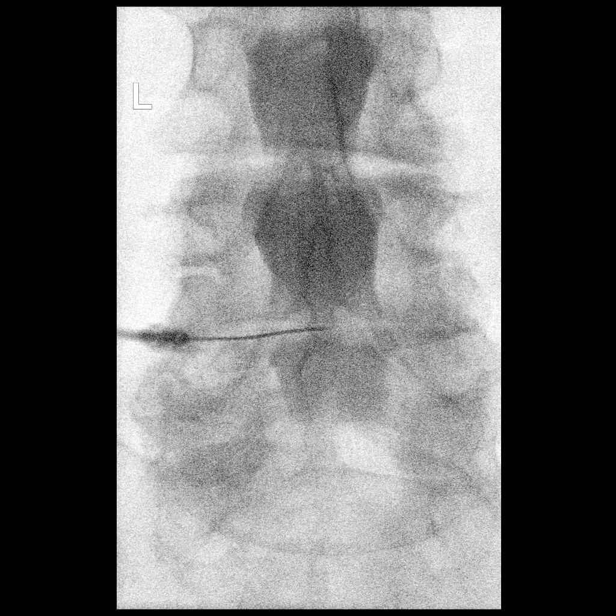

[Series 3: vasc adipose · 1 of 1 slices shown (2 of 11)]
[im 1/1]
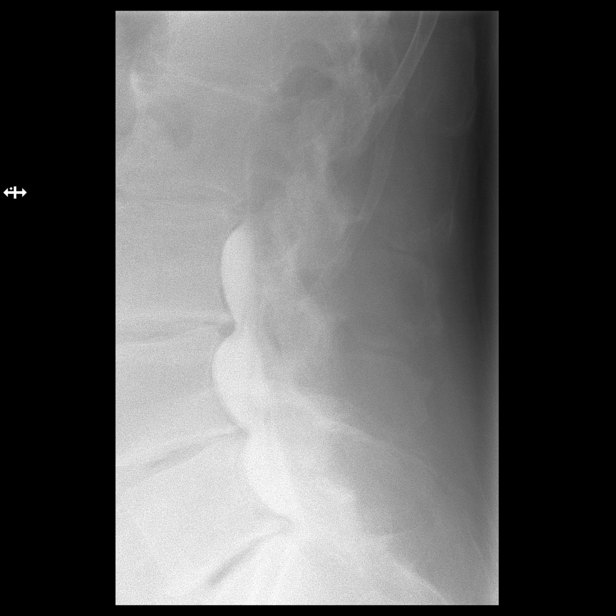

[Series 3: w lumbar spine extension · 0.15mm/px · 1 of 1 slices shown]
[im 1/1]
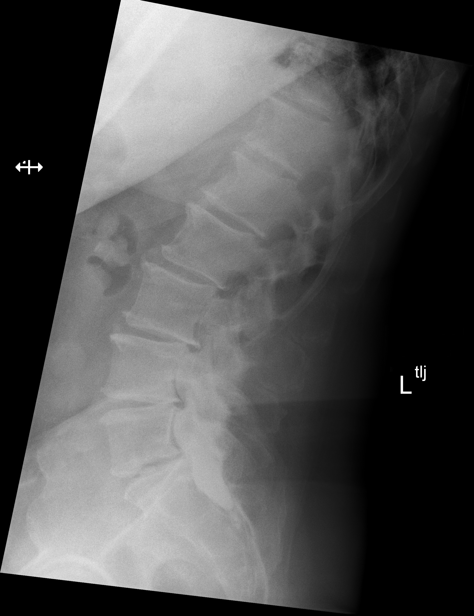

[Series 5: vasc adipose · 1 of 1 slices shown (3 of 11)]
[im 1/1]
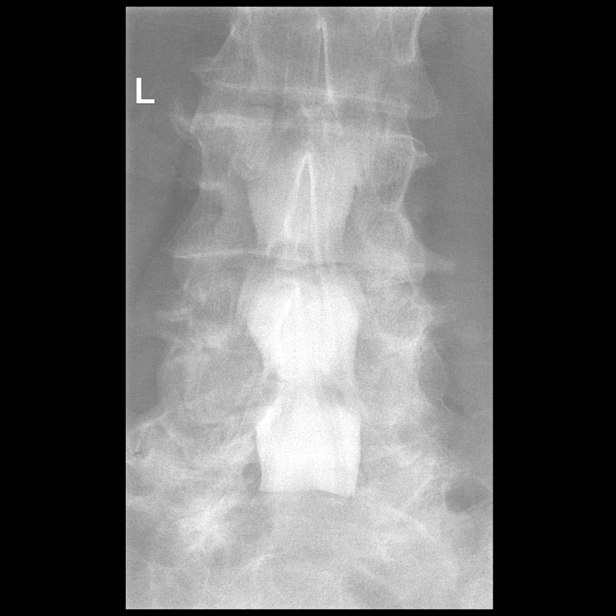

[Series 7: vasc adipose · 1 of 1 slices shown (4 of 11)]
[im 1/1]
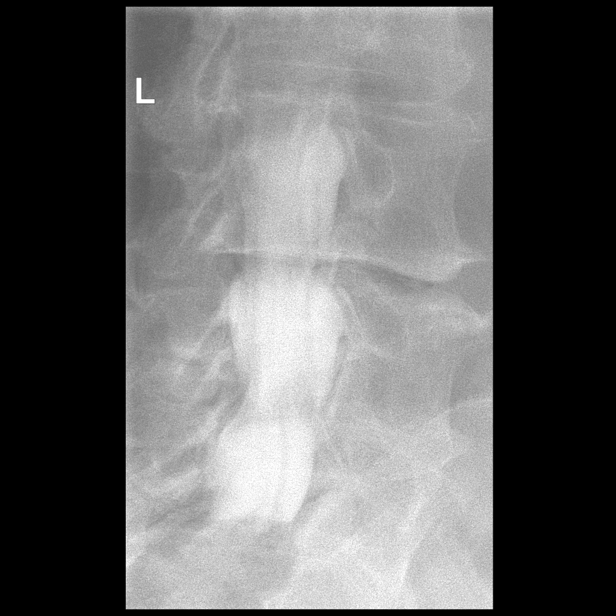

[Series 8: vasc adipose · 1 of 1 slices shown (5 of 11)]
[im 1/1]
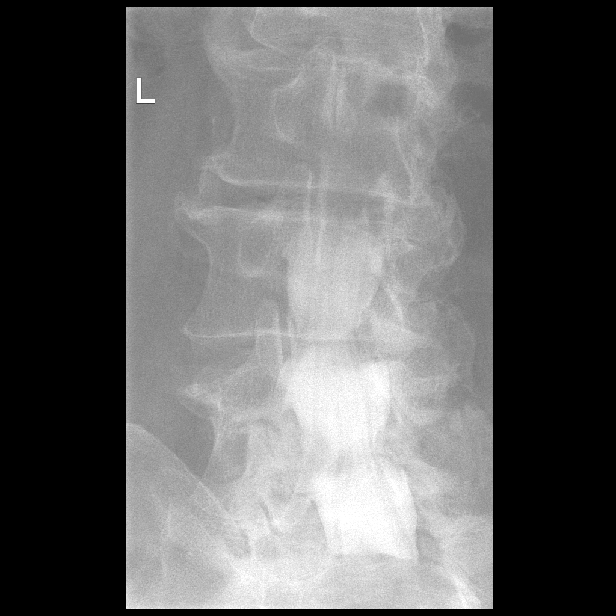

[Series 9: vasc adipose · 1 of 1 slices shown (6 of 11)]
[im 1/1]
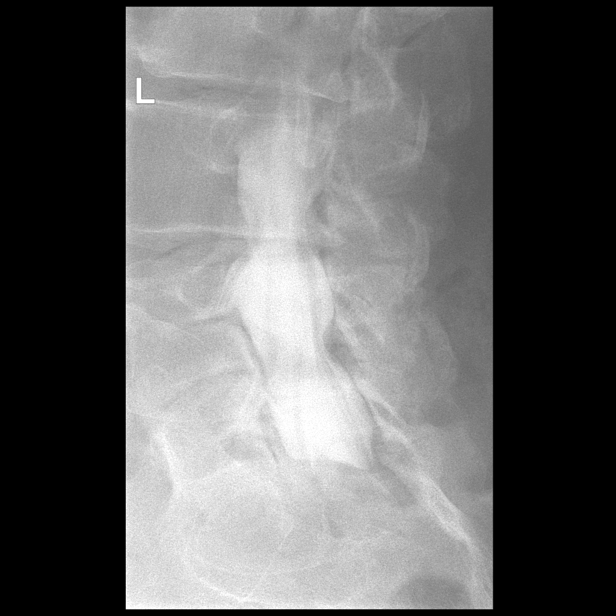

[Series 11: vasc adipose · 1 of 1 slices shown (7 of 11)]
[im 1/1]
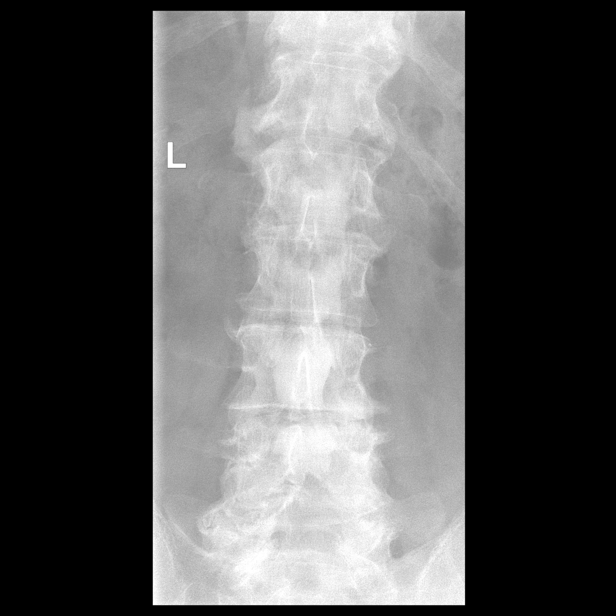

[Series 12: vasc adipose · 1 of 1 slices shown (8 of 11)]
[im 1/1]
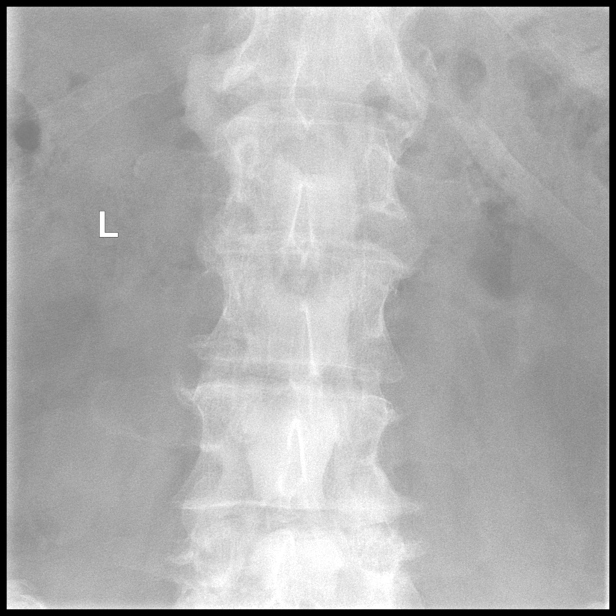

[Series 13: vasc adipose · 1 of 1 slices shown (9 of 11)]
[im 1/1]
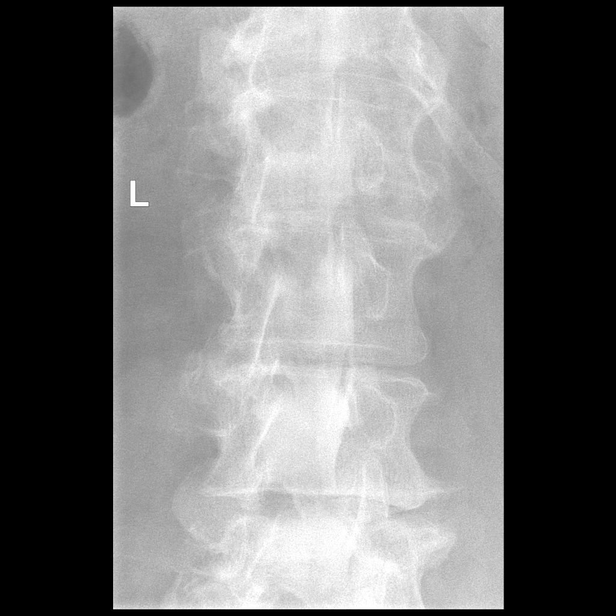

[Series 15: vasc adipose · 1 of 1 slices shown (10 of 11)]
[im 1/1]
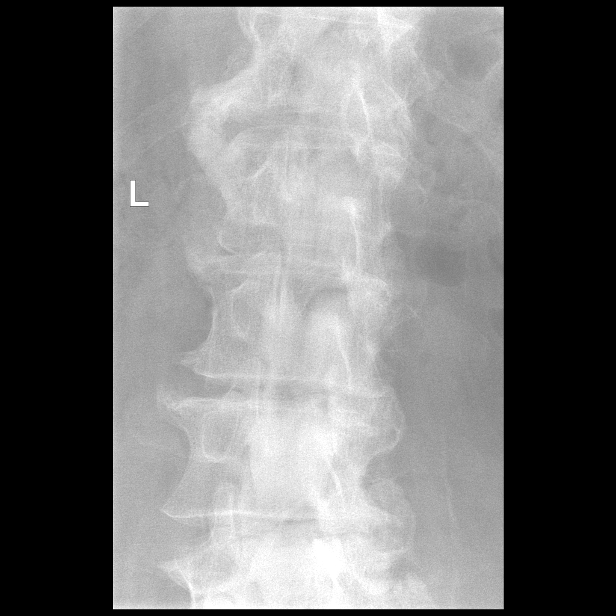

[Series 16: vasc adipose · 1 of 1 slices shown (11 of 11)]
[im 1/1]
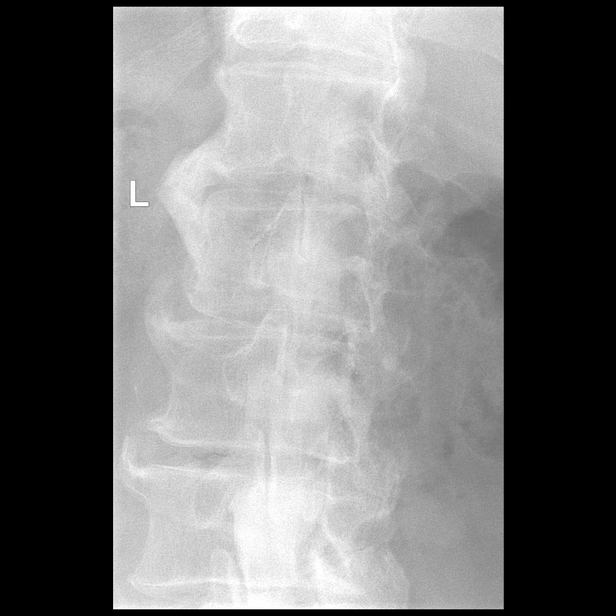

[13 of 17 positions shown; findings below may reference images not displayed]

EXAM:
LUMBAR MYELOGRAM

FLUOROSCOPY:
Fluoroscopy Time: 2 minutes 4 seconds

Radiation Exposure Index: 627.81 microGray*m^2

PROCEDURE:
After thorough discussion of risks and benefits of the procedure
including bleeding, infection, injury to nerves, blood vessels,
adjacent structures as well as headache and CSF leak, written and
oral informed consent was obtained. Consent was obtained by Dr.
Geentta Cobl. Time out form was completed.

Patient was positioned prone on the fluoroscopy table. Local
anesthesia was provided with 1% lidocaine without epinephrine after
prepped and draped in the usual sterile fashion. Puncture was
performed at L4-5 using a 5 inch 22-gauge spinal needle via a left
interlaminar approach. Using a single pass through the dura, the
needle was placed within the thecal sac, with return of clear CSF.
15 mL of Isovue U-FBB was injected into the thecal sac, with normal
opacification of the nerve roots and cauda equina consistent with
free flow within the subarachnoid space.

I personally performed the lumbar puncture and administered the
intrathecal contrast. I also personally supervised acquisition of
the myelogram images.
FINDINGS: LUMBAR MYELOGRAM FINDINGS:

There are 5 non rib-bearing lumbar type vertebrae. Minimal
retrolisthesis of L1 on L2, L2 on L3, and L3 on L4 does not
significantly change with flexion or extension. Ventral extradural
defects are present throughout the lumbar spine without evidence of
high-grade spinal stenosis. There is evidence of lateral recess
stenosis bilaterally at L3-4 and L4-5.

CT LUMBAR MYELOGRAM FINDINGS:

There is minimal retrolisthesis of L1 on L2, L2 on L3, and L3 on L4,
and there is slight left convex curvature of the lumbar spine. No
fracture or suspicious osseous lesion is identified. There is
moderate disc space narrowing throughout the lumbar spine, and
vacuum disc is noted from L2-3 to L5-S1. The conus medullaris
terminates at L1. There is mild abdominal aortic atherosclerosis.

T12-L1: Disc bulging, annular calcification, and mild left facet
arthrosis without stenosis.

L1-2: Retrolisthesis with left eccentric bulging of uncovered disc
and mild facet and ligamentum flavum hypertrophy result in
borderline to mild bilateral lateral recess stenosis without spinal
or neural foraminal stenosis.

L2-3: Retrolisthesis with bulging uncovered disc and severe facet
and ligamentum flavum hypertrophy result in mild bilateral lateral
recess stenosis without spinal or neural foraminal stenosis.

L3-4: Retrolisthesis with bulging uncovered disc, endplate spurring,
and severe facet and ligamentum flavum hypertrophy result in
moderate bilateral lateral recess stenosis and moderate right and
mild left neural foraminal stenosis without significant spinal
stenosis.

L4-5: Disc bulging, endplate spurring, and severe facet and
ligamentum flavum hypertrophy result in mild bilateral lateral
recess stenosis and mild to moderate bilateral neural foraminal
stenosis without significant spinal stenosis.

L5-S1: Disc bulging, endplate spurring, and mild right and severe
left facet hypertrophy result in mild left lateral recess and mild
right and moderate left neural foraminal stenosis without spinal
stenosis.
IMPRESSION: 1. Advanced lumbar disc and facet degeneration without significant
spinal stenosis.
2. Moderate lateral recess and right neural foraminal stenosis at
L3-4.
3. Mild lateral recess and mild to moderate neural foraminal
stenosis at L4-5 and L5-S1.
4. Aortic Atherosclerosis ([id number]).

## 2023-05-15 ENCOUNTER — Encounter: Payer: Self-pay | Admitting: Internal Medicine

## 2023-05-16 ENCOUNTER — Other Ambulatory Visit (HOSPITAL_COMMUNITY): Payer: Self-pay | Admitting: Urology

## 2023-05-16 DIAGNOSIS — C61 Malignant neoplasm of prostate: Secondary | ICD-10-CM

## 2023-05-21 ENCOUNTER — Encounter: Payer: Self-pay | Admitting: Neurosurgery

## 2023-05-23 ENCOUNTER — Ambulatory Visit: Payer: Medicare (Managed Care) | Admitting: Podiatry

## 2023-05-23 ENCOUNTER — Encounter (HOSPITAL_COMMUNITY)
Admission: RE | Admit: 2023-05-23 | Discharge: 2023-05-23 | Disposition: A | Discharge status: Home / Self Care | Payer: Medicare (Managed Care) | Source: Ambulatory Visit | Attending: Urology | Admitting: Urology

## 2023-05-23 ENCOUNTER — Encounter: Payer: Self-pay | Admitting: Podiatry

## 2023-05-23 DIAGNOSIS — B351 Tinea unguium: Secondary | ICD-10-CM

## 2023-05-23 DIAGNOSIS — M79675 Pain in left toe(s): Secondary | ICD-10-CM

## 2023-05-23 DIAGNOSIS — C61 Malignant neoplasm of prostate: Secondary | ICD-10-CM | POA: Insufficient documentation

## 2023-05-23 DIAGNOSIS — M79674 Pain in right toe(s): Secondary | ICD-10-CM

## 2023-05-23 MED ORDER — FLOTUFOLASTAT F 18 GALLIUM 296-5846 MBQ/ML IV SOLN
8.0000 | Freq: Once | INTRAVENOUS | Status: AC
  Administered 2023-05-23: 8.47 via INTRAVENOUS
  Filled 2023-05-23: qty 8

## 2023-05-23 NOTE — Progress Notes (Signed)
This patient returns to the office for evaluation and treatment of long thick painful nails .  This patient is unable to trim his own nails since the patient cannot reach his feet.  Patient says the nails are painful walking and wearing his shoes.  He returns for preventive foot care services.  General Appearance  Alert, conversant and in no acute stress.  Vascular  Dorsalis pedis and posterior tibial  pulses are palpable  bilaterally.  Capillary return is within normal limits  bilaterally. Temperature is within normal limits  bilaterally.  Neurologic  Senn-Weinstein monofilament wire test within normal limits  bilaterally. Muscle power within normal limits bilaterally.  Nails Thick disfigured discolored nails with subungual debris  from hallux to fifth toes bilaterally.  Hallux nails are especially thickened and split and painful   No evidence of bacterial infection or drainage bilaterally.  Orthopedic  No limitations of motion  feet .  No crepitus or effusions noted.  No bony pathology or digital deformities noted.  Skin  normotropic skin with no porokeratosis noted bilaterally.  No signs of infections or ulcers noted.     Onychomycosis  Pain in toes right foot  Pain in toes left foot  Debridement  of nails  1-5  B/L with a nail nipper.  Nails were then filed using a dremel tool with no incidents.    RTC 4  months    Antionetta Ator DPM  

## 2023-06-04 ENCOUNTER — Telehealth: Payer: Self-pay | Admitting: Internal Medicine

## 2023-06-04 DIAGNOSIS — N138 Other obstructive and reflux uropathy: Secondary | ICD-10-CM

## 2023-06-04 NOTE — Telephone Encounter (Signed)
 Copied from CRM [id number]. Topic: Referral - Question >> Jun 04, 2023  9:19 AM Baldomero Bone wrote: Reason for CRM: Patient presented new insurance which requires a referral but office did not catch the change in insurance. Dr Harman Lightning office is having patient see Dr Florencio Hunting for prostate cancer. Patient is seeing Dr Rozanne Corners for possible surgery. Patient is also starting physical therapy. Can this be backdated because patient has been seeing the provider since March 11, 2023? Maria's callback [phone] ext [phone].

## 2023-06-04 NOTE — Telephone Encounter (Signed)
 Please call Allen [name] upon completion.

## 2023-06-04 NOTE — Telephone Encounter (Signed)
 Referral placed.

## 2023-06-05 DIAGNOSIS — Z4542 Encounter for adjustment and management of neuropacemaker (brain) (peripheral nerve) (spinal cord): Secondary | ICD-10-CM | POA: Diagnosis not present

## 2023-06-05 DIAGNOSIS — G250 Essential tremor: Secondary | ICD-10-CM | POA: Diagnosis not present

## 2023-06-11 NOTE — Telephone Encounter (Signed)
 Referral placed.

## 2023-06-11 NOTE — Telephone Encounter (Signed)
 Pt need a referral to see dr Rozanne Corners [facility] for prostate ca

## 2023-06-11 NOTE — Addendum Note (Signed)
 Addended by: Nicolina Barrios B on: 06/11/2023 03:17 PM   Modules accepted: Orders

## 2023-06-12 ENCOUNTER — Ambulatory Visit: Payer: Self-pay

## 2023-06-12 NOTE — Telephone Encounter (Signed)
 Incorrect encounter

## 2023-06-14 ENCOUNTER — Encounter: Payer: Self-pay | Admitting: Internal Medicine

## 2023-06-19 ENCOUNTER — Other Ambulatory Visit: Payer: Self-pay | Admitting: Urology

## 2023-06-28 ENCOUNTER — Encounter: Payer: Self-pay | Admitting: Internal Medicine

## 2023-07-13 IMAGING — XA DG INJECT/[PERSON_NAME] INC NEEDLE/CATH/PLC EPI/CERV/THOR W/IMG
2 series · 2 of 2 positions shown · non-contrast
Comparison: none

CLINICAL DATA: Spondylosis without myelopathy. Neck, shoulder and
arm pain. Minimal if any improvement from the initial injection.

[Series 1: ortho standard · 1 of 1 slices shown (1 of 2)]
[im 1/1]
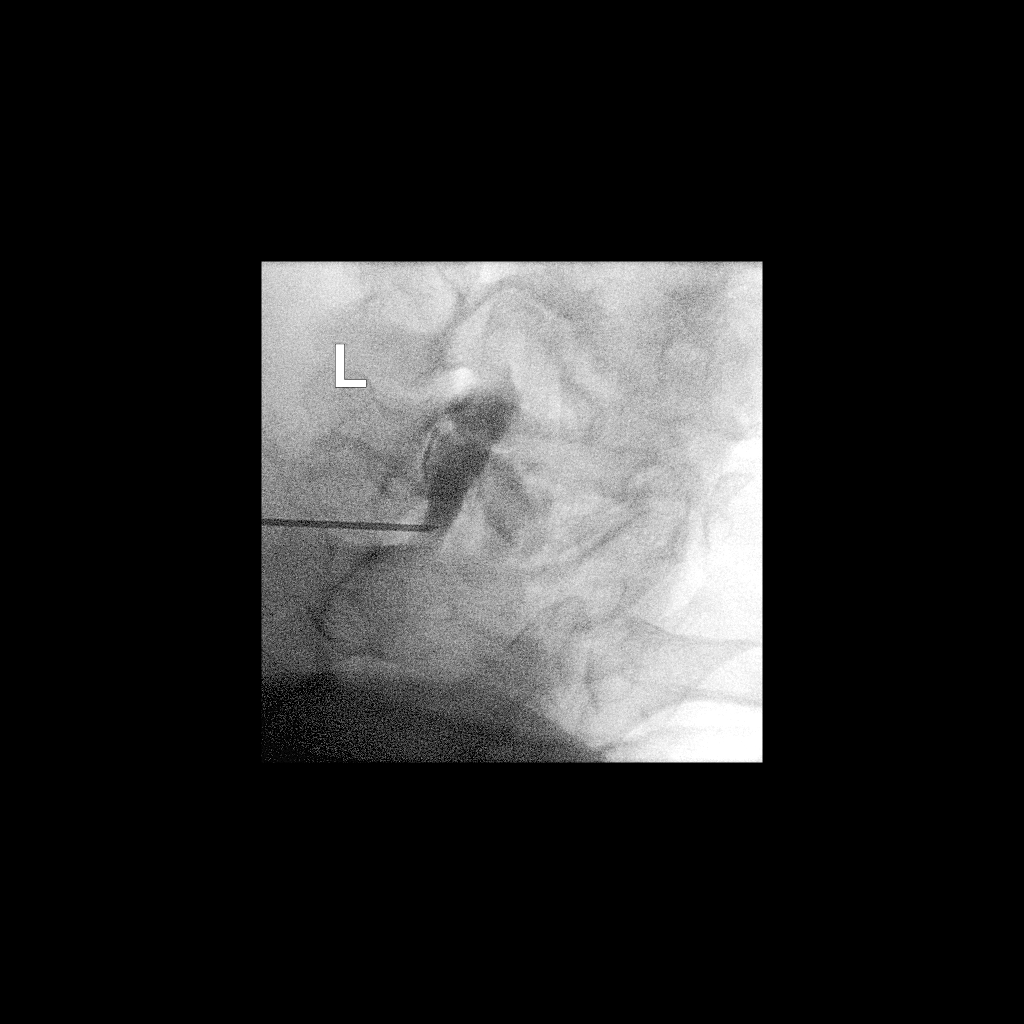

[Series 2: ortho standard · 1 of 1 slices shown (2 of 2)]
[im 1/1]
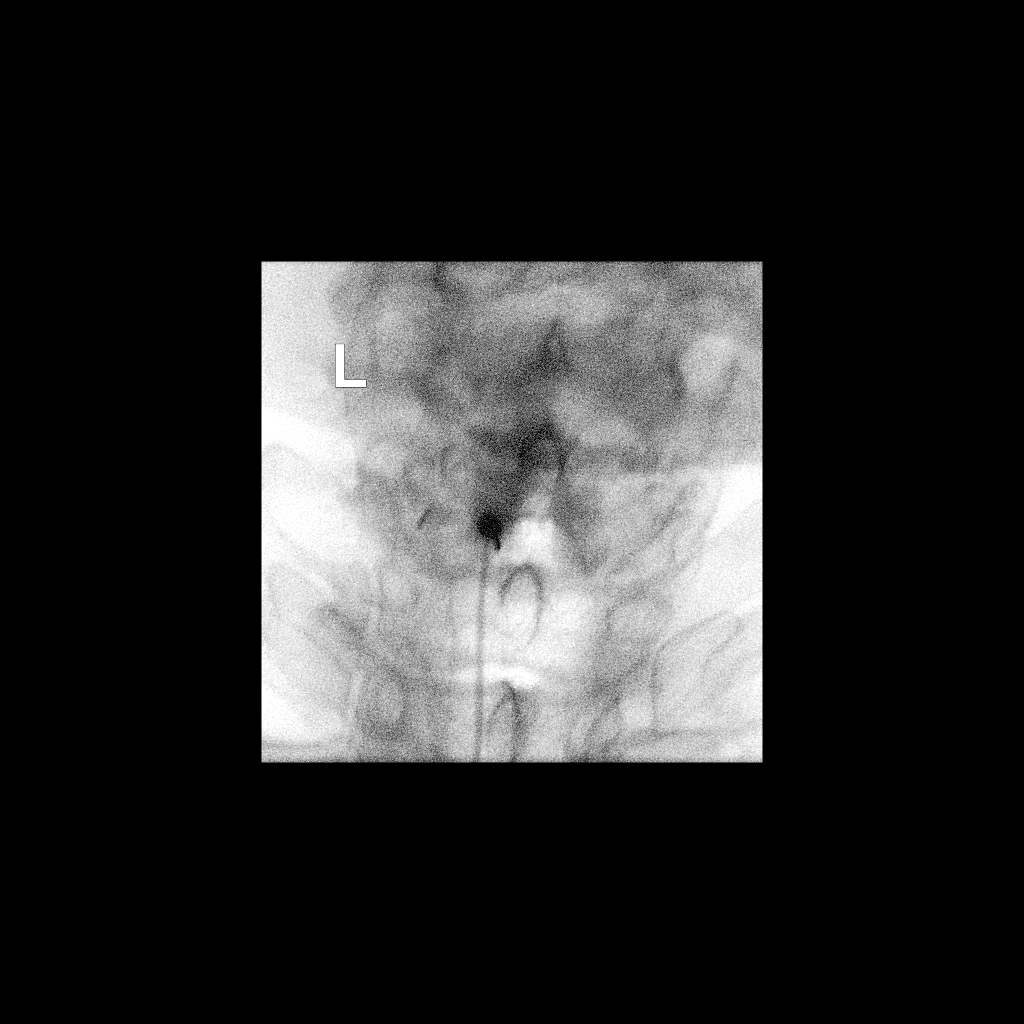

[2 of 2 positions shown; findings below may reference images not displayed]

FLUOROSCOPY:
Radiation Exposure Index (as provided by the fluoroscopic device): 0
minutes 28 seconds. 9.72 micro gray meter squared

PROCEDURE:
CERVICAL EPIDURAL INJECTION

An interlaminar approach was performed on the left at C7-T1. A 20
gauge epidural needle was advanced using loss-of-resistance
technique.

DIAGNOSTIC EPIDURAL INJECTION

Injection of Isovue-M 300 shows a good epidural pattern with spread
above and below the level of needle placement, primarily on the
left, but to both sides. No vascular opacification is seen.
THERAPEUTIC

EPIDURAL INJECTION

1.5 ml of Kenalog 40 mixed with 1 ml of 1% Lidocaine and 2 ml of
normal saline were then instilled. The procedure was well-tolerated,
and the patient was discharged thirty minutes following the
injection in good condition.
IMPRESSION: Technically successful repeat epidural injection on the left at
C7-T1.

## 2023-07-18 NOTE — Patient Instructions (Addendum)
 SURGICAL WAITING ROOM VISITATION Patients having surgery or a procedure may have no more than 2 support people in the waiting area - these visitors may rotate.    Children under the age of 11 must have an adult with them who is not the patient.  If the patient needs to stay at the hospital during part of their recovery, the visitor guidelines for inpatient rooms apply. Pre-op nurse will coordinate an appropriate time for 1 support person to accompany patient in pre-op.  This support person may not rotate.    Please refer to the [facility] website for the visitor guidelines for Inpatients (after your surgery is over and you are in a regular room).       Your procedure is scheduled on: 08-01-23   Report to [facility] Main Entrance    Report to admitting at 5:15 AM   Call this number if you have problems the morning of surgery [phone]   Follow a clear liquid diet the day before surgery   Do not eat food or drink liquids :After Midnight.         If you have questions, please contact your surgeon's office.   FOLLOW BOWEL PREP AND ANY ADDITIONAL PRE OP INSTRUCTIONS YOU RECEIVED FROM YOUR SURGEON'S OFFICE!!!  Mag Citrate and Fleet enema     Oral Hygiene is also important to reduce your risk of infection.                                    Remember - BRUSH YOUR TEETH THE MORNING OF SURGERY WITH YOUR REGULAR TOOTHPASTE   Do NOT smoke after Midnight   Take these medicines the morning of surgery with A SIP OF WATER :    Carbidopa-Levodopa   Escitalopram    Tamsulosin    Hydrocodone  if needed   Okay to use inhalers and nebulizers  Stop all vitamins and herbal supplements 7 days before surgery                              You may not have any metal on your body including  jewelry, and body piercing             Do not wear lotions, powders, cologne, or deodorant              Men may shave face and neck.   Do not bring valuables to the hospital. [facility] IS NOT  RESPONSIBLE   FOR VALUABLES.   Contacts, dentures or bridgework may not be worn into surgery.   Bring small overnight bag day of surgery.   DO NOT BRING YOUR HOME MEDICATIONS TO THE HOSPITAL. PHARMACY WILL DISPENSE MEDICATIONS LISTED ON YOUR MEDICATION LIST TO YOU DURING YOUR ADMISSION IN THE HOSPITAL!              Please read over the following fact sheets you were given: IF YOU HAVE QUESTIONS ABOUT YOUR PRE-OP INSTRUCTIONS PLEASE CALL [phone] Gwen  If you received a COVID test during your pre-op visit  it is requested that you wear a mask when out in public, stay away from anyone that may not be feeling well and notify your surgeon if you develop symptoms. If you test positive for Covid or have been in contact with anyone that has tested positive in the last 10 days please notify you surgeon.  [facility] -  Preparing for Surgery Before surgery, you can play an important role.  Because skin is not sterile, your skin needs to be as free of germs as possible.  You can reduce the number of germs on your skin by washing with CHG (chlorahexidine gluconate) soap before surgery.  CHG is an antiseptic cleaner which kills germs and bonds with the skin to continue killing germs even after washing. Please DO NOT use if you have an allergy to CHG or antibacterial soaps.  If your skin becomes reddened/irritated stop using the CHG and inform your nurse when you arrive at Short Stay. Do not shave (including legs and underarms) for at least 48 hours prior to the first CHG shower.  You may shave your face/neck.  Please follow these instructions carefully:  1.  Shower with CHG Soap the night before surgery and the  morning of surgery.  2.  If you choose to wash your hair, wash your hair first as usual with your normal  shampoo.  3.  After you shampoo, rinse your hair and body thoroughly to remove the shampoo.                             4.  Use CHG as you would any other liquid soap.  You can apply chg  directly to the skin and wash.  Gently with a scrungie or clean washcloth.  5.  Apply the CHG Soap to your body ONLY FROM THE NECK DOWN.   Do   not use on face/ open                           Wound or open sores. Avoid contact with eyes, ears mouth and   genitals (private parts).                       Wash face,  Genitals (private parts) with your normal soap.             6.  Wash thoroughly, paying special attention to the area where your    surgery  will be performed.  7.  Thoroughly rinse your body with warm water  from the neck down.  8.  DO NOT shower/wash with your normal soap after using and rinsing off the CHG Soap.                9.  Pat yourself dry with a clean towel.            10.  Wear clean pajamas.            11.  Place clean sheets on your bed the night of your first shower and do not  sleep with pets. Day of Surgery : Do not apply any lotions/deodorants the morning of surgery.  Please wear clean clothes to the hospital/surgery center.  FAILURE TO FOLLOW THESE INSTRUCTIONS MAY RESULT IN THE CANCELLATION OF YOUR SURGERY  PATIENT SIGNATURE_________________________________  NURSE SIGNATURE__________________________________  ________________________________________________________________________   WHAT IS A BLOOD TRANSFUSION? Blood Transfusion Information  A transfusion is the replacement of blood or some of its parts. Blood is made up of multiple cells which provide different functions. Red blood cells carry oxygen  and are used for blood loss replacement. White blood cells fight against infection. Platelets control bleeding. Plasma helps clot blood. Other blood products are available for specialized needs, such as hemophilia or other clotting disorders. BEFORE  THE TRANSFUSION  Who gives blood for transfusions?  Healthy volunteers who are fully evaluated to make sure their blood is safe. This is blood bank blood. Transfusion therapy is the safest it has ever been in the  practice of medicine. Before blood is taken from a donor, a complete history is taken to make sure that person has no history of diseases nor engages in risky social behavior (examples are intravenous drug use or sexual activity with multiple partners). The donor's travel history is screened to minimize risk of transmitting infections, such as malaria. The donated blood is tested for signs of infectious diseases, such as HIV and hepatitis. The blood is then tested to be sure it is compatible with you in order to minimize the chance of a transfusion reaction. If you or a relative donates blood, this is often done in anticipation of surgery and is not appropriate for emergency situations. It takes many days to process the donated blood. RISKS AND COMPLICATIONS Although transfusion therapy is very safe and saves many lives, the main dangers of transfusion include:  Getting an infectious disease. Developing a transfusion reaction. This is an allergic reaction to something in the blood you were given. Every precaution is taken to prevent this. The decision to have a blood transfusion has been considered carefully by your caregiver before blood is given. Blood is not given unless the benefits outweigh the risks. AFTER THE TRANSFUSION Right after receiving a blood transfusion, you will usually feel much better and more energetic. This is especially true if your red blood cells have gotten low (anemic). The transfusion raises the level of the red blood cells which carry oxygen , and this usually causes an energy increase. The nurse administering the transfusion will monitor you carefully for complications. HOME CARE INSTRUCTIONS  No special instructions are needed after a transfusion. You may find your energy is better. Speak with your caregiver about any limitations on activity for underlying diseases you may have. SEEK MEDICAL CARE IF:  Your condition is not improving after your transfusion. You develop  redness or irritation at the intravenous (IV) site. SEEK IMMEDIATE MEDICAL CARE IF:  Any of the following symptoms occur over the next 12 hours: Shaking chills. You have a temperature by mouth above 102 F (38.9 C), not controlled by medicine. Chest, back, or muscle pain. People around you feel you are not acting correctly or are confused. Shortness of breath or difficulty breathing. Dizziness and fainting. You get a rash or develop hives. You have a decrease in urine output. Your urine turns a dark color or changes to pink, red, or brown. Any of the following symptoms occur over the next 10 days: You have a temperature by mouth above 102 F (38.9 C), not controlled by medicine. Shortness of breath. Weakness after normal activity. The white part of the eye turns yellow (jaundice). You have a decrease in the amount of urine or are urinating less often. Your urine turns a dark color or changes to pink, red, or brown. Document Released: 12/30/1999 Document Revised: 03/26/2011 Document Reviewed: 08/18/2007 [facility] Patient Information 2014 [place], [place].  _______________________________________________________________________

## 2023-07-18 NOTE — Progress Notes (Addendum)
  Date of COVID positive in last 90 days:  No  PCP - Tully Ly, MD Cardiologist - Shelda Bruckner, MD Neurologist - Deepal Shah-Zamora Pulmonologist - Almarie Ferrari, NP/Veneet Shellia, MD  Chest x-ray - N/A EKG - 07-25-23 Epic Stress Test - Yes ECHO - 05-01-22 Epic Cardiac Cath - Yes Pacemaker/ICD device last checked: N/A Spinal Cord Stimulator: N/A Deep brain stimulator - Pt will bring remote  Bowel Prep - Mag Citrate and fleet enema.  Patient has instructions  Sleep Study - Yes, +sleep apnea CPAP - No  Fasting Blood Sugar - N/A Checks Blood Sugar _____ times a day  Wegovy  (no longer taking).  Has not taken in a couple of months Last dose of GLP1 agonist-  N/A GLP1 instructions:     Last dose of SGLT-2 inhibitors-  N/A SGLT-2 instructions:  Do not take after    Blood Thinner Instructions: N/A Aspirin Instructions: Last Dose:  Activity level:  Can go up a flight of stairs and perform activities of daily living without stopping and without symptoms of chest pain.  Patient states he has shortness of breath at times due to COPD, has not worsened.  Anesthesia review: CHF, COPD, essential tremor, cardiomegaly  Patient denies shortness of breath, fever, cough and chest pain at PAT appointment  Patient verbalized understanding of instructions that were given to them at the PAT appointment. Patient was also instructed that they will need to review over the PAT instructions again at home before surgery.

## 2023-07-25 ENCOUNTER — Encounter (HOSPITAL_COMMUNITY): Payer: Self-pay

## 2023-07-25 ENCOUNTER — Encounter (HOSPITAL_COMMUNITY)
Admission: RE | Admit: 2023-07-25 | Discharge: 2023-07-25 | Disposition: A | Discharge status: Home / Self Care | Payer: Medicare (Managed Care) | Source: Ambulatory Visit | Attending: Urology | Admitting: Urology

## 2023-07-25 ENCOUNTER — Other Ambulatory Visit: Payer: Self-pay

## 2023-07-25 VITALS — BP 142/86 | HR 77 | Temp 98.1°F | Resp 18 | Ht 70.0 in | Wt 230.4 lb

## 2023-07-25 DIAGNOSIS — Z01818 Encounter for other preprocedural examination: Secondary | ICD-10-CM | POA: Diagnosis not present

## 2023-07-25 HISTORY — DX: Malignant neoplasm of prostate: C61

## 2023-07-25 HISTORY — DX: Depression, unspecified: F32.A

## 2023-07-25 LAB — CBC
HCT: 42.1 % (ref 39.0–52.0)
Hemoglobin: 13.8 g/dL (ref 13.0–17.0)
MCH: 32.6 pg (ref 26.0–34.0)
MCHC: 32.8 g/dL (ref 30.0–36.0)
MCV: 99.5 fL (ref 80.0–100.0)
Platelets: 259 K/uL (ref 150–400)
RBC: 4.23 MIL/uL (ref 4.22–5.81)
RDW: 13.8 % (ref 11.5–15.5)
WBC: 5.8 K/uL (ref 4.0–10.5)
nRBC: 0 % (ref 0.0–0.2)

## 2023-07-25 LAB — BASIC METABOLIC PANEL WITH GFR
Anion gap: 9 (ref 5–15)
BUN: 22 mg/dL (ref 8–23)
CO2: 26 mmol/L (ref 22–32)
Calcium: 9.2 mg/dL (ref 8.9–10.3)
Chloride: 104 mmol/L (ref 98–111)
Creatinine, Ser: 1.11 mg/dL (ref 0.61–1.24)
GFR, Estimated: >60 mL/min (ref >60)
Glucose, Bld: 100 mg/dL — ABNORMAL HIGH (ref 70–99)
Potassium: 4.4 mmol/L (ref 3.5–5.1)
Sodium: 139 mmol/L (ref 135–145)

## 2023-07-26 DIAGNOSIS — M1712 Unilateral primary osteoarthritis, left knee: Secondary | ICD-10-CM | POA: Diagnosis not present

## 2023-07-30 NOTE — Progress Notes (Signed)
 Anesthesia Chart Review   Case: [id number] Date/Time: 08/01/23 0715   Procedures:      PROSTATECTOMY, RADICAL, ROBOT-ASSISTED, LAPAROSCOPIC - LEVEL 3     LYMPHADENECTOMY, PELVIS, ROBOT-ASSISTED (Bilateral)     CYSTOSCOPY, WITH URETHRAL DILATION   Anesthesia type: General   Diagnosis: Prostate cancer (HCC) [C61]   Pre-op diagnosis: PROSTATE CANCER   Location: [facility] ROOM 03 / [facility]   Surgeons: Renda Glance, MD       DISCUSSION:73 y.o. former smoker with h/o GERD, mild OSA no longer requires cpap after weight loss, COPD, HTN, essential tremor, deep brain stimulator in place, BPH, prostate cancer scheduled for above procedure 08/01/2023 with Dr. Glance Renda.   Pt has been off Wegovy  for a few months.   Pt last seen by cardiology 03/18/2023. Follows for h/o cardiomegaly on CT, lower extremity edema, varicose veins. Per previous cardio notes pt had a cath in [place]  many years ago with nonobstructive CAD. Per notes pt denies chest pain, palpitations, dyspnea, orthopnea. At this visit he was cleared for an upcoming orthopedic surgery.  Per note, According to the Revised Cardiac Risk Index (RCRI), his Perioperative Risk of Major Cardiac Event is (%): 0.4. His Functional Capacity in METs is: 7.01 according to the [facility] Activity Status Index (DASI). Therefore, based on ACC/AHA guidelines, patient would be at acceptable risk for the planned procedure without further cardiovascular testing.   Pt reports at PAT visit he can climb a flight of stairs without difficulty, denies shortness of breath or chest pain.   Echo 05/01/2022 1. Left ventricular ejection fraction, by estimation, is 50 to 55%. The  left ventricle has low normal function. The left ventricle has no regional  wall motion abnormalities. There is mild left ventricular hypertrophy.  Left ventricular diastolic  parameters are consistent with Grade I diastolic dysfunction (impaired  relaxation).   2. Right ventricular systolic function is  normal. The right ventricular  size is normal. There is normal pulmonary artery systolic pressure. The  estimated right ventricular systolic pressure is 29.7 mmHg.   3. Left atrial size was mild to moderately dilated.   4. The mitral valve is normal in structure. Trivial mitral valve  regurgitation. No evidence of mitral stenosis.   5. The aortic valve is tricuspid. Aortic valve regurgitation is not  visualized. No aortic stenosis is present.   6. The inferior vena cava is normal in size with <50% respiratory  variability, suggesting right atrial pressure of 8 mmHg.   Pt follows with pulmonology for COPD and asthma.  Pt last seen 04/22/2023. COPD/asthma stable at PAT visit, well controlled on Trelegy. Per notes he is optimized for surgery from pulmonary standpoint. COPD stable at PAT visit with no recent exacerbations.  Evaluate DOS.   Pt follows with neurology for essential tremor s/p deep brain stimulator.  He was last seen 06/05/23. Per notes pt stopped primidone  after prostate cancer diagnosis, this was restarted at this visit.  Tremor fairly controlled with DBS. Denies falls, swallowing issues, no other neurological sx. No signs of Parkinsonism. Per notes pt will turn off DBS prior to surgery and bring remote to turn back on postoperatively. Per notes, ADDENDUM: Urologist Dr. Renda contacted me about electrocautery and need to use monopolar. I contacted [employer] Rep who provided me updated guidelines which I shared with surgeon.   Advised to contact [employer] for further guidance if needed: [phone]   VS: BP (!) 142/86   Pulse 77   Temp 36.7 C (Oral)   Resp  18   Ht 5' 10 (1.778 m)   Wt 104.5 kg   SpO2 96%   BMI 33.06 kg/m   PROVIDERS: Theophilus Andrews, Tully GRADE, MD is PCP  Cardiologist - Shelda Bruckner, MD Neurologist - Deepal Shah-Zamora Pulmonologist - Almarie Ferrari, NP/Veneet Shellia, MD LABS: Labs reviewed: Acceptable for surgery. (all labs ordered are listed,  but only abnormal results are displayed)  Labs Reviewed  BASIC METABOLIC PANEL WITH GFR - Abnormal; Notable for the following components:      Result Value   Glucose, Bld 100 (*)    All other components within normal limits  CBC  TYPE AND SCREEN     IMAGES:   EKG:   CV: Echo 05/01/2022 1. Left ventricular ejection fraction, by estimation, is 50 to 55%. The  left ventricle has low normal function. The left ventricle has no regional  wall motion abnormalities. There is mild left ventricular hypertrophy.  Left ventricular diastolic  parameters are consistent with Grade I diastolic dysfunction (impaired  relaxation).   2. Right ventricular systolic function is normal. The right ventricular  size is normal. There is normal pulmonary artery systolic pressure. The  estimated right ventricular systolic pressure is 29.7 mmHg.   3. Left atrial size was mild to moderately dilated.   4. The mitral valve is normal in structure. Trivial mitral valve  regurgitation. No evidence of mitral stenosis.   5. The aortic valve is tricuspid. Aortic valve regurgitation is not  visualized. No aortic stenosis is present.   6. The inferior vena cava is normal in size with <50% respiratory  variability, suggesting right atrial pressure of 8 mmHg.  Past Medical History:  Diagnosis Date   Anxiety    Arthritis    neck -   BPH with obstruction/lower urinary tract symptoms    urologist-- dr pace   Cancer [facility])    basal cell to nose   Centrilobular emphysema (HCC)    followed by dr shellia   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[facility])    followed by dr shellia   Cough 07/2020   covid residual, no congestion   Depression    DOE (dyspnea on exertion)    due to copd/ emphysema   ED (erectile dysfunction)    Essential tremor    neurologist--- dr tat----  bilateral upper extremities,  s/p DPS 12/ 2016   GERD (gastroesophageal reflux disease)    History of 2019 novel coronavirus disease (COVID-19) 07/18/2020   per pt postive  home test 07-18-2020 w/ mild  symptoms per pt; pt stated had urgent care visit 07-24-2020 for sob/ cough with negative cxr (in care everywhere) ;  follow up pcp visit 08-03-2020 in epic   Insomnia    Mixed simple and mucopurulent chronic bronchitis (HCC)    OSA (obstructive sleep apnea)    followed by dr shellia---- no longer does uses bipap, intolerant --- pt prescriped  Oxygen   2 L oxygen  via  while sleeping  (study in epic 04/ 2017 [facility])   Perennial allergic rhinitis    Pneumonia    Prostate cancer (HCC)    Pulmonary nodules    followed by dr shellia   S/P deep brain stimulator placement 12/2014   followed by neurology-----IPG change 01-21-2020 for end-of life battery  (pt has a control)   Varicose vein of leg    per pt has had 3 procedure's last one 2006   Wears dentures    upper    Past Surgical History:  Procedure Laterality  Date   ANKLE SURGERY Right 2020   tendon repair   APPENDECTOMY     CARDIAC CATHETERIZATION     CARPAL TUNNEL RELEASE Left 2019   CATARACT EXTRACTION W/ INTRAOCULAR LENS IMPLANT Bilateral 2020   COLONOSCOPY     CYSTOSCOPY WITH URETHRAL DILATATION N/A 08/29/2021   Procedure: CYSTOSCOPY WITH URETHRAL DILATATION;  Surgeon: Elisabeth Valli BIRCH, MD;  Location: [facility];  Service: Urology;  Laterality: N/A;   DEEP BRAIN STIMULATOR PLACEMENT Left 2016   INGUINAL HERNIA REPAIR Right 2007   KNEE ARTHROSCOPY W/ MENISCECTOMY Right 06/25/2022   ROTATOR CUFF REPAIR Right 2014   SUBTHALAMIC STIMULATOR BATTERY REPLACEMENT N/A 01/21/2020   Procedure: Deep brain stimulator battery replacement;  Surgeon: Unice Pac, MD;  Location: [facility] OR;  Service: Neurosurgery;  Laterality: N/A;   TONSILLECTOMY AND ADENOIDECTOMY     age 82   TOTAL SHOULDER REPLACEMENT Left 2016   TRANSURETHRAL RESECTION OF PROSTATE N/A 08/09/2020   Procedure: TRANSURETHRAL RESECTION OF THE PROSTATE (TURP), BLADDER BIOPSY AND FULGERATION;  Surgeon: Elisabeth Valli BIRCH, MD;  Location: [facility];  Service: Urology;  Laterality: N/A;   TRANSURETHRAL RESECTION OF PROSTATE N/A 08/29/2021   Procedure: RE-TRANSURETHRAL RESECTION OF THE PROSTATE (TURP);  Surgeon: Elisabeth Valli BIRCH, MD;  Location: [facility];  Service: Urology;  Laterality: N/A;   VEIN SURGERY Left 2006   varicose veins    MEDICATIONS:  albuterol  (VENTOLIN  HFA) 108 (90 Base) MCG/ACT inhaler   Azelastine -Fluticasone  137-50 MCG/ACT SUSP   busPIRone  (BUSPAR ) 30 MG tablet   carbidopa -levodopa  (SINEMET  IR) 25-100 MG tablet   Cholecalciferol (VITAMIN D3) 125 MCG (5000 UT) TABS   Cyanocobalamin  2500 MCG SUBL   escitalopram  (LEXAPRO ) 10 MG tablet   Fluticasone -Umeclidin-Vilant (TRELEGY ELLIPTA ) 200-62.5-25 MCG/ACT AEPB   Fluticasone -Umeclidin-Vilant (TRELEGY ELLIPTA ) 200-62.5-25 MCG/ACT AEPB   gabapentin  (NEURONTIN ) 300 MG capsule   HYDROcodone  bit-homatropine (HYCODAN) 5-1.5 MG/5ML syrup   methylPREDNISolone  (MEDROL  DOSEPAK) 4 MG TBPK tablet   montelukast  (SINGULAIR ) 10 MG tablet   Multiple Vitamins-Minerals (PRESERVISION AREDS) TABS   mupirocin ointment (BACTROBAN) 2 %   Omega-3 Fatty Acids (FISH OIL PO)   pantoprazole  (PROTONIX ) 40 MG tablet   phenazopyridine  (PYRIDIUM ) 200 MG tablet   Semaglutide -Weight Management (WEGOVY ) 0.25 MG/0.5ML SOAJ   tadalafil  (CIALIS ) 20 MG tablet   tamsulosin  (FLOMAX ) 0.4 MG CAPS capsule   traZODone  (DESYREL ) 100 MG tablet   triamcinolone  (KENALOG ) 0.025 % cream   TURMERIC PO   No current facility-administered medications for this encounter.     Harlene Hoots Ward, PA-C [facility] [phone]

## 2023-07-30 NOTE — Anesthesia Preprocedure Evaluation (Signed)
 Anesthesia Evaluation  Patient identified by MRN, date of birth, ID band Patient awake    Reviewed: Allergy & Precautions, NPO status , Patient's Chart, lab work & pertinent test results  Airway Mallampati: I  TM Distance: >3 FB Neck ROM: Full    Dental  (+) Teeth Intact, Dental Advisory Given   Pulmonary asthma , sleep apnea , COPD, former smoker   breath sounds clear to auscultation       Cardiovascular +CHF and + DOE   Rhythm:Regular Rate:Normal     Neuro/Psych  PSYCHIATRIC DISORDERS Anxiety Depression     Neuromuscular disease    GI/Hepatic Neg liver ROS, PUD,GERD  ,,  Endo/Other  negative endocrine ROS    Renal/GU negative Renal ROS     Musculoskeletal  (+) Arthritis ,    Abdominal   Peds  Hematology negative hematology ROS (+)   Anesthesia Other Findings   Reproductive/Obstetrics                              Anesthesia Physical Anesthesia Plan  ASA: 3  Anesthesia Plan: General   Post-op Pain Management: Tylenol  PO (pre-op)*   Induction: Intravenous  PONV Risk Score and Plan: 3 and Ondansetron , Dexamethasone  and Midazolam   Airway Management Planned: Oral ETT  Additional Equipment: None  Intra-op Plan:   Post-operative Plan: Extubation in OR  Informed Consent: I have reviewed the patients History and Physical, chart, labs and discussed the procedure including the risks, benefits and alternatives for the proposed anesthesia with the patient or authorized representative who has indicated his/her understanding and acceptance.     Dental advisory given  Plan Discussed with: CRNA  Anesthesia Plan Comments: (See PAT note 07/25/2023)         Anesthesia Quick Evaluation

## 2023-07-31 NOTE — H&P (Signed)
 Office Visit Report     07/09/2023   --------------------------------------------------------------------------------   Allen [name]  MRN: [id number]  DOB: 05/23/1950, 73 year old Male  SSN:    PRIMARY CARE:  Tully GRADE. Theophilus Andrews, MD  PRIMARY CARE FAX:  [phone]  REFERRING:  Tully GRADE. Theophilus Andrews, MD  PROVIDER:  Gretel Ferrara, M.D.  TREATING:  Ubaldo Eagles, NP  LOCATION:  [facility] [phone]     --------------------------------------------------------------------------------   CC/HPI: 07/09/2023: Patient with below noted history. Here today for preoperative appointment prior to undergoing flexible cystoscopy with possible balloon dilation if a urethral stricture is present, and bilateral nerve sparing robot-assisted laparoscopic radical prostatectomy and bilateral pelvic lymphadenectomy with Dr. Ferrara on 7/17. He has already received appropriate evaluation from both cardiology and pulmonology in anticipation of the upcoming surgery. Dr. Ferrara has also been in contact with his neurologist regarding perioperative management of his deep brain stimulator. Patient is aware of the risks for heat injury or damage during the surgery and still wishes to proceed with surgical therapy. He has already met with pelvic floor PT in anticipation of his rehabilitation postoperatively on 2 separate occasions prior to today's appointment.  Doing well today. He is accompanied by his wife. He denies any changes in past medical history, prescription medications taken on daily basis, no interval surgical or procedural intervention. Previously planned orthopedic surgery has been pushed back to later this year allowing him to undergo prostatectomy and fully recover from that before proceeding. Currently voiding at his baseline with stable symptomology. He has had no interval changes in force of stream or increased frequency/urgency. Denies any recent dysuria or gross  hematuria. He denies any recent chest pain or shortness of breath, fever/chills or nausea/vomiting.   CC: Prostate Cancer   Physician requesting consult: Dr. Valli Shank  PCP: Dr. Tully Theophilus [name]   Allen [name] is a 73 year old gentleman who was noted to have an elevated PSA of 5.96 in 2021 prompting an initial prostate biopsy that was negative. His PSA then increased to 10.2 prompting an MRI of the prostate that indicated a small PI-RADS 4 lesion of the left anterior apex peripheral zone. An MR/US  fusion biopsy on 04/26/23 confirmed Gleason 4+3=7 adenocarcinoma with 2 out of 15 biopsy cores positive for malignancy. Although the targeted biopsies were benign, both positive systematic biopsies were positive in the region of the left apex.   He does have a history of BPH and LUTS and underwent a TURP in 2022. He required cystoscopy and balloon dilation of a subsequent stricture. He does continue to have some issues with a weaker stream.   Family history: None.   Imaging studies:  MRI: No EPE, SVI, LAD, or bone lesions.  PSMA PET scan (05/23/2023): No evidence of metastatic disease.   PMH: He has a history of GERD and COPD. He apparently was getting scheduled undergo knee surgery and underwent both a cardiac and recent pulmonary evaluation. He was seen by Dr. Lonni in cardiology and Almarie Ferrari, NP with [facility] pulmonology.  PSH: TURP, open appendectomy, and right inguinal hernia repair. He also has a deep brain stimulator for an essential tremor that has been present since childhood. He can turn this off.   TNM stage: cT1c N0 Mx  PSA: 10.2  Gleason score: 4+3=7 (GG 3)  Biopsy (04/26/23): 2/15 cores positive  Left: L lateral apex (30%, 4+3=7), L apex (5%, 4+3=7)  Right: Benign  Prostate volume: 16.9 cc  Nomogram  OC disease: 46%  EPE: 50%  SVI: 6%  LNI: 8%  PFS (5 year, 10 year): 63%, 47%   Urinary function: IPSS is N/A. He takes tamsulosin .  Erectile function: SHIM  score is N/A. He is treated with tadalafil  10 mg prn.     ALLERGIES: No Known Allergies    MEDICATIONS: Tamsulosin  HCl 0.4 MG Capsule 1 capsule PO Q HS  Tamsulosin  HCl 0.4 MG Capsule 1 capsule PO Daily  Tamsulosin  HCl 0.4 MG Capsule 1 capsule PO Q HS  Tamsulosin  HCl 0.4 MG Capsule 1 capsule PO Q HS  Albuterol  Sulfate  Escitalopram  Oxalate  Eye Vitamin  Fish Oil  Flonase  Allergy Relief  Icaps Areds  Montelukast  Sodium  Pantoprazole  Sodium 40 MG Solution Reconstituted  Tadalafil  20 MG Tablet 1 tablet SQ Daily PRN sexual activity  Tadalafil  (PAH) 20 MG Tablet 1/2 tablet PO Daily  Tadalafil  (PAH) 20 MG Tablet 1 tablet PO Daily  traZODone  HCl 100 MG Tablet  Turmeric  Vitamin D3  Zolpidem  Tartrate 10 MG Tablet     GU PSH: Cysto Bladder Ureth Biopsy - 2022 Cysto Dilate Stricture (M or F) - 08/29/2021 Cystoscopy - 08/09/2021, 2023 Cystoscopy TURP - 2022 Prostate Needle Biopsy - 04/26/2023, 2022, 2021 Remove Prostate Regrowth - 08/29/2021     NON-GU PSH: Anesth, Shoulder Replacement, Left Appendectomy Carpal tunnel surgery, Left Cataract surgery, Bilateral Hernia Repair, Right Leg surgery (unspecified), Bilateral Rotator cuff surgery, Right Surgical Pathology, Gross And Microscopic Examination For Prostate Needle - 04/26/2023, 2022, 2021 Visit Complexity (formerly GPC1X) - 05/10/2023, 03/11/2023, 09/21/2022     GU PMH: Stress Incontinence - 06/26/2023 Prostate Cancer - 06/19/2023, - 05/28/2023, - 05/10/2023 ED due to arterial insufficiency - 05/10/2023, - 03/11/2023, - 09/21/2022, - 03/21/2022, - 2022, - 2022, Continue Cialis  10mg  daily, - 2021, Patient is managed with 10 mg of daily Cialis . Although this is not typical dose patient has been on this for a while and tolerated just fine okay continue., - 2020 Straining on Urination - 05/10/2023, - 03/11/2023, - 03/21/2022, - 09/05/2021 (Stable), - 08/09/2021, - 2023 Urinary Hesitancy - 05/10/2023, - 03/21/2022 (Stable), - 2022, - 2022, - 2022 BPH w/LUTS  - 04/29/2023, - 03/11/2023, - 09/21/2022, - 03/21/2022, - 09/20/2021, - 09/13/2021, - 09/05/2021, - 08/31/2021, - 08/09/2021, - 2023, - 2023, - 2022, - 2022, - 2022, - 2022, - 2022, Patient is not emptying his bladder well and would like to resume Uroxatral. He will let me know if he has adverse side effects. PVR in the office today 13 cc and no sign of infection. , - 2022, Patient would like to try Uroxatral to see if this helps his urinary symptoms., - 2021, Will give patient trial of Flomax  0.4 mg to take at night. He takes his Cialis  in the morning. He was counseled about possible side effects., - 2021 Elevated PSA - 04/26/2023, - 03/11/2023, - 09/21/2022, - 2022, - 2022, - 2022, Patient's repeat PSA did not decline as expected. PSA remains elevated at 16.3. Patient is now able to have an MRI of the prostate and will plan on getting an expedited study. Based on results patient understands he may need an MR-ultrasound fusion biopsy., - 2022, PSA of 18, he has had recent prostatitis which likely explains this elevation., - 2022, He will have PSA repeated in the Spring of this year., - 2022, I would like patient to repeat PSA today. If it remains elevated I think he needs a repeat prostate biopsy. He is unable  to get an MRI due to the deep brain stimulator., - 2021, Discussed possible reasons for elevated PSA including recent infection, trauma, inflammation, indwelling catheter, enlarged prostate and prostate cancer. I would like to schedule transrectal ultrasound guided prostate biopsy for patient. I discussed risks and benefits of prostate biospy including blood in the urine/stool/semen, pain, and risk of post biopsy sepsis. He was given instructions regarding the prostate biospy. He will take 1 tab of levaquin 750mg  PO the morning of his prostate biopsy. Patient understands and agrees with the above. , - 2021 Nocturia - 03/11/2023, - 09/21/2022 (Stable), - 08/09/2021, - 2022, - 2021, - 2021 Urinary Frequency - 09/05/2021, - 2023,  - 2022, - 2022, - 2021 Urinary Urgency - 09/05/2021, (Stable), - 08/09/2021, - 2023 Splitting of urinary stream (Stable) - 08/09/2021, - 2023 RLQ pain - 2023 Dysuria - 2022, - 2022 Acute prostatitis - 2022, - 2022, - 2022 Gross hematuria - 2021 Encounter for Prostate Cancer screening (Stable, Chronic), DRE today within normal limits. Will send patient to lab for PSA and call with results. He may return in 1 year for repeat PSA and DRE. - 2020    NON-GU PMH: Muscle weakness (generalized) - 06/26/2023, - 06/19/2023 Other muscle spasm - 06/26/2023 GERD Sleep Apnea    FAMILY HISTORY: Asthma - Father copd - Mother Death - Father, Mother rectal cancer - Sister   SOCIAL HISTORY: Marital Status: Married Preferred Language: English; Ethnicity: Not Hispanic Or Latino; Race: White Current Smoking Status: Patient does not smoke anymore.   Tobacco Use Assessment Completed: Used Tobacco in last 30 days? Drinks 2 drinks per day.  Drinks 2 caffeinated drinks per day. Patient's occupation is/was Retired.    REVIEW OF SYSTEMS:    GU Review Male:   Patient reports frequent urination and get up at night to urinate. Patient denies hard to postpone urination, burning/ pain with urination, leakage of urine, stream starts and stops, trouble starting your stream, have to strain to urinate , erection problems, and penile pain.  Gastrointestinal (Upper):   Patient denies vomiting, indigestion/ heartburn, and nausea.  Gastrointestinal (Lower):   Patient denies diarrhea and constipation.  Constitutional:   Patient denies fever, night sweats, weight loss, and fatigue.  Skin:   Patient denies skin rash/ lesion and itching.  Eyes:   Patient denies blurred vision and double vision.  Ears/ Nose/ Throat:   Patient denies sore throat and sinus problems.  Hematologic/Lymphatic:   Patient denies swollen glands and easy bruising.  Cardiovascular:   Patient denies leg swelling and chest pains.  Respiratory:   Patient  denies cough and shortness of breath.  Endocrine:   Patient denies excessive thirst.  Musculoskeletal:   Patient reports joint pain. Patient denies back pain.  Neurological:   Patient denies headaches and dizziness.  Psychologic:   Patient denies depression and anxiety.   Notes: Updated from previous visit 03/11/2023 with review from patient as noted above.   VITAL SIGNS:      07/09/2023 02:56 PM  Weight 225 lb / 102.06 kg  Height 70 in / 177.8 cm  BP 118/79 mmHg  Pulse 90 /min  Temperature 97.8 F / 36.5 C  BMI 32.3 kg/m   MULTI-SYSTEM PHYSICAL EXAMINATION:    Constitutional: Well-nourished. No physical deformities. Normally developed. Good grooming.  Neck: Neck symmetrical, not swollen. Normal tracheal position.  Respiratory: No labored breathing, no use of accessory muscles.   Cardiovascular: Normal temperature, normal extremity pulses, no swelling, no varicosities.  Skin: No paleness,  no jaundice, no cyanosis. No lesion, no ulcer, no rash.  Neurologic / Psychiatric: Oriented to time, oriented to place, oriented to person. No depression, no anxiety, no agitation.  Gastrointestinal: No mass, no tenderness, no rigidity, non obese abdomen.  Musculoskeletal: Normal gait and station of head and neck.     Complexity of Data:  Source Of History:  Patient, Family/Caregiver, Medical Record Summary  Lab Test Review:   PSA  Records Review:   Pathology Reports, Previous Doctor Records, Previous Hospital Records, Previous Patient Records  Urine Test Review:   Urinalysis, Urine Culture  Urodynamics Review:   Review Bladder Scan  X-Ray Review: PET- PSMA Scan: Reviewed Report.     03/11/23 11/06/21 10/25/20 04/28/20 11/23/19 08/20/19 12/04/18  PSA  Total PSA 10.20 ng/mL 3.43 ng/mL 3.07 ng/mL 16.30 ng/mL 4.19 ng/mL 5.96 ng/mL 3.63 ng/mL    07/09/23  Urinalysis  Urine Appearance Clear   Urine Color Yellow   Urine Glucose Neg mg/dL  Urine Bilirubin Neg mg/dL  Urine Ketones Neg mg/dL   Urine Specific Gravity 1.010   Urine Blood Neg ery/uL  Urine pH 5.5   Urine Protein Neg mg/dL  Urine Urobilinogen 0.2 mg/dL  Urine Nitrites Neg   Urine Leukocyte Esterase Neg leu/uL   PROCEDURES:          Visit Complexity - G2211          Urinalysis Dipstick Dipstick Cont'd  Color: Yellow Bilirubin: Neg mg/dL  Appearance: Clear Ketones: Neg mg/dL  Specific Gravity: 8.989 Blood: Neg ery/uL  pH: 5.5 Protein: Neg mg/dL  Glucose: Neg mg/dL Urobilinogen: 0.2 mg/dL    Nitrites: Neg    Leukocyte Esterase: Neg leu/uL    ASSESSMENT:      ICD-10 Details  1 GU:   Prostate Cancer - C61 Chronic, Threat to Bodily Function  2 NON-GU:   Encounter for other preprocedural examination - Z01.818 Undiagnosed New Problem   PLAN:           Orders Labs Urine Culture          Schedule Return Visit/Planned Activity: Keep Scheduled Appointment - Follow up MD, Schedule Surgery          Document Letter(s):  Created for Patient: Clinical Summary         Notes:   All questions answered to the best of my ability regarding the upcoming procedure and expected postoperative course with understanding expressed by the patient. Precautionary urine culture sent today to serve as a baseline. He will proceed with previously scheduled prostatectomy with Dr. Renda on 7/17.        Next Appointment:      Next Appointment: 08/01/2023 07:15 AM    Appointment Type: Surgery     Location: [facility] [phone]    Provider: Gretel Renda, M.D.    Reason for Visit: WL/OBS RALP LEV 3, BPLND, FLEXIBLE CYSTO, POSS URETHRAL BALLOON DILATION W AMAND      * Signed by Ubaldo Eagles, NP on 07/09/23 at 4:19 PM (EDT)*

## 2023-08-01 ENCOUNTER — Other Ambulatory Visit: Payer: Self-pay

## 2023-08-01 ENCOUNTER — Ambulatory Visit (HOSPITAL_COMMUNITY): Admitting: Anesthesiology

## 2023-08-01 ENCOUNTER — Encounter (HOSPITAL_COMMUNITY)
Admission: RE | Disposition: A | Discharge status: Home / Self Care | Payer: Self-pay | Source: Home / Self Care | Attending: Urology

## 2023-08-01 ENCOUNTER — Observation Stay (HOSPITAL_COMMUNITY)
Admission: RE | Admit: 2023-08-01 | Discharge: 2023-08-02 | Disposition: A | Discharge status: Home / Self Care | Payer: Medicare (Managed Care) | Attending: Urology | Admitting: Urology

## 2023-08-01 ENCOUNTER — Encounter (HOSPITAL_COMMUNITY): Payer: Self-pay | Admitting: Urology

## 2023-08-01 ENCOUNTER — Ambulatory Visit (HOSPITAL_COMMUNITY): Admitting: Physician Assistant

## 2023-08-01 DIAGNOSIS — C61 Malignant neoplasm of prostate: Secondary | ICD-10-CM

## 2023-08-01 DIAGNOSIS — Z87891 Personal history of nicotine dependence: Secondary | ICD-10-CM | POA: Insufficient documentation

## 2023-08-01 DIAGNOSIS — J449 Chronic obstructive pulmonary disease, unspecified: Secondary | ICD-10-CM

## 2023-08-01 DIAGNOSIS — I5032 Chronic diastolic (congestive) heart failure: Secondary | ICD-10-CM | POA: Diagnosis not present

## 2023-08-01 DIAGNOSIS — F418 Other specified anxiety disorders: Secondary | ICD-10-CM

## 2023-08-01 DIAGNOSIS — Z79899 Other long term (current) drug therapy: Secondary | ICD-10-CM | POA: Insufficient documentation

## 2023-08-01 HISTORY — PX: ROBOT ASSISTED LAPAROSCOPIC RADICAL PROSTATECTOMY: SHX5141

## 2023-08-01 LAB — TYPE AND SCREEN
ABO/RH(D): O POS
Antibody Screen: NEGATIVE

## 2023-08-01 LAB — HEMOGLOBIN AND HEMATOCRIT, BLOOD
HCT: 39.4 % (ref 39.0–52.0)
Hemoglobin: 12.4 g/dL — ABNORMAL LOW (ref 13.0–17.0)

## 2023-08-01 LAB — ABO/RH: ABO/RH(D): O POS

## 2023-08-01 SURGERY — PROSTATECTOMY, RADICAL, ROBOT-ASSISTED, LAPAROSCOPIC
Anesthesia: General

## 2023-08-01 MED ORDER — OXYCODONE HCL 5 MG/5ML PO SOLN: 5.0000 mg | Freq: Once | ORAL | Status: DC | PRN

## 2023-08-01 MED ORDER — DOCUSATE SODIUM 100 MG PO CAPS
100.0000 mg | ORAL_CAPSULE | Freq: Two times a day (BID) | ORAL | Status: DC
  Administered 2023-08-01 – 2023-08-02 (×2): 100 mg via ORAL
  Filled 2023-08-01 (×2): qty 1

## 2023-08-01 MED ORDER — PROPOFOL 10 MG/ML IV BOLUS
INTRAVENOUS | Status: AC
  Filled 2023-08-01: qty 20

## 2023-08-01 MED ORDER — ALBUMIN HUMAN 5 % IV SOLN
INTRAVENOUS | Status: AC
  Filled 2023-08-01: qty 250

## 2023-08-01 MED ORDER — PHENYLEPHRINE 80 MCG/ML (10ML) SYRINGE FOR IV PUSH (FOR BLOOD PRESSURE SUPPORT)
PREFILLED_SYRINGE | INTRAVENOUS | Status: AC
  Filled 2023-08-01: qty 10

## 2023-08-01 MED ORDER — FENTANYL CITRATE (PF) 100 MCG/2ML IJ SOLN
INTRAMUSCULAR | Status: AC
  Filled 2023-08-01: qty 2

## 2023-08-01 MED ORDER — LACTATED RINGERS IV SOLN: INTRAVENOUS | Status: DC

## 2023-08-01 MED ORDER — HYDROMORPHONE HCL 1 MG/ML IJ SOLN
INTRAMUSCULAR | Status: AC
Start: 2023-08-01 — End: 2023-08-01
  Filled 2023-08-01: qty 1

## 2023-08-01 MED ORDER — MORPHINE SULFATE (PF) 2 MG/ML IV SOLN
2.0000 mg | INTRAVENOUS | Status: DC | PRN
  Administered 2023-08-01: 2 mg via INTRAVENOUS
  Filled 2023-08-01: qty 1

## 2023-08-01 MED ORDER — MIDAZOLAM HCL 2 MG/2ML IJ SOLN
INTRAMUSCULAR | Status: DC | PRN
  Administered 2023-08-01: 2 mg via INTRAVENOUS

## 2023-08-01 MED ORDER — KCL IN DEXTROSE-NACL 20-5-0.45 MEQ/L-%-% IV SOLN
INTRAVENOUS | Status: DC
  Filled 2023-08-01 (×3): qty 1000

## 2023-08-01 MED ORDER — CEFAZOLIN SODIUM-DEXTROSE 2-4 GM/100ML-% IV SOLN
2.0000 g | INTRAVENOUS | Status: AC
  Administered 2023-08-01: 2 g via INTRAVENOUS
  Filled 2023-08-01: qty 100

## 2023-08-01 MED ORDER — MIDAZOLAM HCL 2 MG/2ML IJ SOLN
INTRAMUSCULAR | Status: AC
  Filled 2023-08-01: qty 2

## 2023-08-01 MED ORDER — ALBUMIN HUMAN 5 % IV SOLN
INTRAVENOUS | Status: AC
Start: 2023-08-01 — End: 2023-08-01
  Filled 2023-08-01: qty 250

## 2023-08-01 MED ORDER — HYDROMORPHONE HCL 1 MG/ML IJ SOLN
0.2500 mg | INTRAMUSCULAR | Status: DC | PRN
  Administered 2023-08-01 (×4): 0.5 mg via INTRAVENOUS

## 2023-08-01 MED ORDER — ZOLPIDEM TARTRATE 5 MG PO TABS
5.0000 mg | ORAL_TABLET | Freq: Every evening | ORAL | Status: DC | PRN
Start: 2023-08-01 — End: 2023-08-02

## 2023-08-01 MED ORDER — CHLORHEXIDINE GLUCONATE 0.12 % MT SOLN
15.0000 mL | Freq: Once | OROMUCOSAL | Status: AC
  Administered 2023-08-01: 15 mL via OROMUCOSAL

## 2023-08-01 MED ORDER — AZELASTINE-FLUTICASONE 137-50 MCG/ACT NA SUSP: 1.0000 | Freq: Two times a day (BID) | NASAL | Status: DC | PRN

## 2023-08-01 MED ORDER — OXYCODONE HCL 5 MG PO TABS: 5.0000 mg | ORAL_TABLET | Freq: Once | ORAL | Status: DC | PRN

## 2023-08-01 MED ORDER — BUDESON-GLYCOPYRROL-FORMOTEROL 160-9-4.8 MCG/ACT IN AERO
2.0000 | INHALATION_SPRAY | Freq: Two times a day (BID) | RESPIRATORY_TRACT | Status: DC
  Administered 2023-08-01 – 2023-08-02 (×2): 2 via RESPIRATORY_TRACT
  Filled 2023-08-01: qty 5.9

## 2023-08-01 MED ORDER — TRAMADOL HCL 50 MG PO TABS: 50.0000 mg | ORAL_TABLET | Freq: Four times a day (QID) | ORAL | 0 refills | Status: DC | PRN

## 2023-08-01 MED ORDER — BUPIVACAINE-EPINEPHRINE 0.25% -1:200000 IJ SOLN
INTRAMUSCULAR | Status: DC | PRN
  Administered 2023-08-01: 30 mL

## 2023-08-01 MED ORDER — PHENYLEPHRINE HCL (PRESSORS) 10 MG/ML IV SOLN
INTRAVENOUS | Status: AC
  Filled 2023-08-01: qty 1

## 2023-08-01 MED ORDER — MEPERIDINE HCL 25 MG/ML IJ SOLN
6.2500 mg | INTRAMUSCULAR | Status: DC | PRN
  Filled 2023-08-01: qty 1

## 2023-08-01 MED ORDER — ACETAMINOPHEN 325 MG PO TABS: 650.0000 mg | ORAL_TABLET | ORAL | Status: DC | PRN

## 2023-08-01 MED ORDER — LIDOCAINE HCL (PF) 2 % IJ SOLN
INTRAMUSCULAR | Status: AC
  Filled 2023-08-01: qty 5

## 2023-08-01 MED ORDER — STERILE WATER FOR IRRIGATION IR SOLN
Status: DC | PRN
  Administered 2023-08-01: 1000 mL

## 2023-08-01 MED ORDER — BUSPIRONE HCL 10 MG PO TABS
30.0000 mg | ORAL_TABLET | Freq: Every day | ORAL | Status: DC
  Administered 2023-08-01: 30 mg via ORAL
  Filled 2023-08-01: qty 3

## 2023-08-01 MED ORDER — ACETAMINOPHEN 10 MG/ML IV SOLN
1000.0000 mg | Freq: Once | INTRAVENOUS | Status: DC | PRN
  Administered 2023-08-01: 1000 mg via INTRAVENOUS

## 2023-08-01 MED ORDER — GABAPENTIN 300 MG PO CAPS: 300.0000 mg | ORAL_CAPSULE | Freq: Three times a day (TID) | ORAL | Status: DC

## 2023-08-01 MED ORDER — SUGAMMADEX SODIUM 200 MG/2ML IV SOLN
INTRAVENOUS | Status: DC | PRN
  Administered 2023-08-01: 200 mg via INTRAVENOUS

## 2023-08-01 MED ORDER — CARBIDOPA-LEVODOPA 25-100 MG PO TABS: 1.0000 | ORAL_TABLET | Freq: Three times a day (TID) | ORAL | Status: DC

## 2023-08-01 MED ORDER — PROPOFOL 10 MG/ML IV BOLUS
INTRAVENOUS | Status: DC | PRN
Start: 2023-08-01 — End: 2023-08-01
  Administered 2023-08-01: 50 mg via INTRAVENOUS
  Administered 2023-08-01: 150 mg via INTRAVENOUS

## 2023-08-01 MED ORDER — KETAMINE HCL 50 MG/5ML IJ SOSY
PREFILLED_SYRINGE | INTRAMUSCULAR | Status: AC
  Filled 2023-08-01: qty 5

## 2023-08-01 MED ORDER — TRAZODONE HCL 100 MG PO TABS
100.0000 mg | ORAL_TABLET | Freq: Every day | ORAL | Status: DC
  Administered 2023-08-01: 100 mg via ORAL
  Filled 2023-08-01: qty 1

## 2023-08-01 MED ORDER — ONDANSETRON HCL 4 MG/2ML IJ SOLN
INTRAMUSCULAR | Status: DC | PRN
  Administered 2023-08-01: 4 mg via INTRAVENOUS

## 2023-08-01 MED ORDER — ACETAMINOPHEN 10 MG/ML IV SOLN
INTRAVENOUS | Status: AC
  Filled 2023-08-01: qty 100

## 2023-08-01 MED ORDER — ORAL CARE MOUTH RINSE: 15.0000 mL | Freq: Once | OROMUCOSAL | Status: AC

## 2023-08-01 MED ORDER — SODIUM CHLORIDE 0.9 % IV BOLUS
1000.0000 mL | Freq: Once | INTRAVENOUS | Status: AC
  Administered 2023-08-01: 1000 mL via INTRAVENOUS

## 2023-08-01 MED ORDER — DEXAMETHASONE SODIUM PHOSPHATE 10 MG/ML IJ SOLN
INTRAMUSCULAR | Status: DC | PRN
  Administered 2023-08-01: 8 mg via INTRAVENOUS

## 2023-08-01 MED ORDER — HEPARIN SODIUM (PORCINE) 1000 UNIT/ML IJ SOLN
INTRAMUSCULAR | Status: AC
  Filled 2023-08-01: qty 1

## 2023-08-01 MED ORDER — DIPHENHYDRAMINE HCL 12.5 MG/5ML PO ELIX: 12.5000 mg | ORAL_SOLUTION | Freq: Four times a day (QID) | ORAL | Status: DC | PRN

## 2023-08-01 MED ORDER — CEFAZOLIN SODIUM-DEXTROSE 1-4 GM/50ML-% IV SOLN
1.0000 g | Freq: Three times a day (TID) | INTRAVENOUS | Status: AC
  Administered 2023-08-01 (×2): 1 g via INTRAVENOUS
  Filled 2023-08-01 (×2): qty 50

## 2023-08-01 MED ORDER — TRIPLE ANTIBIOTIC 3.5-400-5000 EX OINT: 1.0000 | TOPICAL_OINTMENT | Freq: Three times a day (TID) | CUTANEOUS | Status: DC | PRN

## 2023-08-01 MED ORDER — CIPROFLOXACIN HCL 500 MG PO TABS: 500.0000 mg | ORAL_TABLET | Freq: Two times a day (BID) | ORAL | 0 refills | Status: DC

## 2023-08-01 MED ORDER — LACTATED RINGERS IV SOLN: INTRAVENOUS | Status: DC | PRN

## 2023-08-01 MED ORDER — ESCITALOPRAM OXALATE 10 MG PO TABS
10.0000 mg | ORAL_TABLET | Freq: Every day | ORAL | Status: DC
  Administered 2023-08-01: 10 mg via ORAL
  Filled 2023-08-01: qty 1

## 2023-08-01 MED ORDER — HYOSCYAMINE SULFATE 0.125 MG SL SUBL: 0.1250 mg | SUBLINGUAL_TABLET | Freq: Four times a day (QID) | SUBLINGUAL | Status: DC | PRN

## 2023-08-01 MED ORDER — HYDROMORPHONE HCL 1 MG/ML IJ SOLN
INTRAMUSCULAR | Status: AC
  Filled 2023-08-01: qty 1

## 2023-08-01 MED ORDER — PHENYLEPHRINE HCL-NACL 20-0.9 MG/250ML-% IV SOLN
INTRAVENOUS | Status: DC | PRN
  Administered 2023-08-01: 50 ug/min via INTRAVENOUS

## 2023-08-01 MED ORDER — DOCUSATE SODIUM 100 MG PO CAPS: 100.0000 mg | ORAL_CAPSULE | Freq: Two times a day (BID) | ORAL | Status: DC

## 2023-08-01 MED ORDER — MONTELUKAST SODIUM 10 MG PO TABS
10.0000 mg | ORAL_TABLET | Freq: Every day | ORAL | Status: DC
  Administered 2023-08-01: 10 mg via ORAL
  Filled 2023-08-01: qty 1

## 2023-08-01 MED ORDER — ROCURONIUM BROMIDE 10 MG/ML (PF) SYRINGE
PREFILLED_SYRINGE | INTRAVENOUS | Status: DC | PRN
Start: 2023-08-01 — End: 2023-08-01
  Administered 2023-08-01: 50 mg via INTRAVENOUS
  Administered 2023-08-01 (×4): 20 mg via INTRAVENOUS
  Administered 2023-08-01: 30 mg via INTRAVENOUS
  Administered 2023-08-01: 10 mg via INTRAVENOUS

## 2023-08-01 MED ORDER — BUPIVACAINE-EPINEPHRINE (PF) 0.25% -1:200000 IJ SOLN
INTRAMUSCULAR | Status: AC
  Filled 2023-08-01: qty 30

## 2023-08-01 MED ORDER — DIPHENHYDRAMINE HCL 50 MG/ML IJ SOLN: 12.5000 mg | Freq: Four times a day (QID) | INTRAMUSCULAR | Status: DC | PRN

## 2023-08-01 MED ORDER — KETAMINE HCL 50 MG/5ML IJ SOSY
PREFILLED_SYRINGE | INTRAMUSCULAR | Status: DC | PRN
  Administered 2023-08-01: 30 mg via INTRAVENOUS
  Administered 2023-08-01: 20 mg via INTRAVENOUS

## 2023-08-01 MED ORDER — ROCURONIUM BROMIDE 10 MG/ML (PF) SYRINGE
PREFILLED_SYRINGE | INTRAVENOUS | Status: AC
  Filled 2023-08-01: qty 10

## 2023-08-01 MED ORDER — FENTANYL CITRATE (PF) 100 MCG/2ML IJ SOLN
INTRAMUSCULAR | Status: DC | PRN
  Administered 2023-08-01: 100 ug via INTRAVENOUS
  Administered 2023-08-01 (×2): 50 ug via INTRAVENOUS

## 2023-08-01 MED ORDER — EPHEDRINE SULFATE-NACL 50-0.9 MG/10ML-% IV SOSY
PREFILLED_SYRINGE | INTRAVENOUS | Status: DC | PRN
  Administered 2023-08-01 (×2): 10 mg via INTRAVENOUS

## 2023-08-01 MED ORDER — KETOROLAC TROMETHAMINE 15 MG/ML IJ SOLN
15.0000 mg | Freq: Four times a day (QID) | INTRAMUSCULAR | Status: DC
  Administered 2023-08-01 – 2023-08-02 (×4): 15 mg via INTRAVENOUS
  Filled 2023-08-01 (×4): qty 1

## 2023-08-01 MED ORDER — ONDANSETRON HCL 4 MG/2ML IJ SOLN: 4.0000 mg | INTRAMUSCULAR | Status: DC | PRN

## 2023-08-01 MED ORDER — PANTOPRAZOLE SODIUM 40 MG PO TBEC: 40.0000 mg | DELAYED_RELEASE_TABLET | Freq: Two times a day (BID) | ORAL | Status: DC

## 2023-08-01 MED ORDER — LIDOCAINE HCL (PF) 2 % IJ SOLN
INTRAMUSCULAR | Status: DC | PRN
Start: 2023-08-01 — End: 2023-08-01
  Administered 2023-08-01: 100 mg via INTRADERMAL

## 2023-08-01 MED ORDER — DROPERIDOL 2.5 MG/ML IJ SOLN: 0.6250 mg | Freq: Once | INTRAMUSCULAR | Status: DC | PRN

## 2023-08-01 MED ORDER — ONDANSETRON HCL 4 MG/2ML IJ SOLN
INTRAMUSCULAR | Status: AC
  Filled 2023-08-01: qty 2

## 2023-08-01 MED ORDER — ALBUTEROL SULFATE (2.5 MG/3ML) 0.083% IN NEBU: 2.5000 mg | INHALATION_SOLUTION | RESPIRATORY_TRACT | Status: DC | PRN

## 2023-08-01 SURGICAL SUPPLY — 68 items
APPLICATOR COTTON TIP 6 STRL (MISCELLANEOUS) ×2 IMPLANT
BAG COUNTER SPONGE SURGICOUNT (BAG) IMPLANT
BAG URINE DRAIN 2000ML AR STRL (UROLOGICAL SUPPLIES) ×2 IMPLANT
BALLOON NEPHROSTOMY (BALLOONS) IMPLANT
CATH FOLEY 2W COUNCIL 20FR 5CC (CATHETERS) IMPLANT
CATH FOLEY 2WAY SLVR 18FR 30CC (CATHETERS) ×2 IMPLANT
CATH ROBINSON RED A/P 14FR (CATHETERS) IMPLANT
CATH ROBINSON RED A/P 16FR (CATHETERS) ×2 IMPLANT
CATH ROBINSON RED A/P 8FR (CATHETERS) ×2 IMPLANT
CATH TIEMANN FOLEY 18FR 5CC (CATHETERS) ×2 IMPLANT
CATH URET 5FR 70CM CONE TIP (BALLOONS) IMPLANT
CATH URETL OPEN END 6FR 70 (CATHETERS) IMPLANT
CHLORAPREP W/TINT 26 (MISCELLANEOUS) ×2 IMPLANT
CLIP LIGATING HEM O LOK PURPLE (MISCELLANEOUS) ×2 IMPLANT
CLOTH BEACON ORANGE TIMEOUT ST (SAFETY) ×2 IMPLANT
COVER SURGICAL LIGHT HANDLE (MISCELLANEOUS) ×2 IMPLANT
COVER TIP SHEARS 8 DVNC (MISCELLANEOUS) ×2 IMPLANT
CUTTER ECHEON FLEX ENDO 45 340 (ENDOMECHANICALS) ×2 IMPLANT
DERMABOND ADVANCED .7 DNX12 (GAUZE/BANDAGES/DRESSINGS) ×2 IMPLANT
DRAPE ARM DVNC X/XI (DISPOSABLE) ×8 IMPLANT
DRAPE COLUMN DVNC XI (DISPOSABLE) ×2 IMPLANT
DRAPE SURG IRRIG POUCH 19X23 (DRAPES) ×2 IMPLANT
DRIVER NDL LRG 8 DVNC XI (INSTRUMENTS) ×4 IMPLANT
DRIVER NDLE LRG 8 DVNC XI (INSTRUMENTS) ×4 IMPLANT
DRSG TEGADERM 4X4.75 (GAUZE/BANDAGES/DRESSINGS) ×2 IMPLANT
ELECT PENCIL ROCKER SW 15FT (MISCELLANEOUS) ×2 IMPLANT
ELECT REM PT RETURN 15FT ADLT (MISCELLANEOUS) ×2 IMPLANT
FORCEPS BPLR LNG DVNC XI (INSTRUMENTS) ×2 IMPLANT
FORCEPS PROGRASP DVNC XI (FORCEP) ×2 IMPLANT
GAUZE SPONGE 4X4 12PLY STRL (GAUZE/BANDAGES/DRESSINGS) ×2 IMPLANT
GLOVE BIO SURGEON STRL SZ 6.5 (GLOVE) ×2 IMPLANT
GLOVE BIO SURGEON STRL SZ7.5 (GLOVE) ×2 IMPLANT
GLOVE SURG LX STRL 7.5 STRW (GLOVE) ×4 IMPLANT
GOWN STRL REUS W/ TWL XL LVL3 (GOWN DISPOSABLE) ×4 IMPLANT
GOWN STRL SURGICAL XL XLNG (GOWN DISPOSABLE) ×2 IMPLANT
GUIDEWIRE ANG ZIPWIRE 038X150 (WIRE) IMPLANT
GUIDEWIRE STR DUAL SENSOR (WIRE) IMPLANT
HOLDER FOLEY CATH W/STRAP (MISCELLANEOUS) ×2 IMPLANT
IRRIGATION SUCT STRKRFLW 2 WTP (MISCELLANEOUS) ×2 IMPLANT
IV LACTATED RINGERS 1000ML (IV SOLUTION) ×2 IMPLANT
KIT TURNOVER KIT A (KITS) ×2 IMPLANT
MANIFOLD NEPTUNE II (INSTRUMENTS) IMPLANT
NDL SAFETY ECLIPSE 18X1.5 (NEEDLE) IMPLANT
NS IRRIG 1000ML POUR BTL (IV SOLUTION) IMPLANT
PACK CYSTO (CUSTOM PROCEDURE TRAY) ×2 IMPLANT
PACK ROBOT UROLOGY CUSTOM (CUSTOM PROCEDURE TRAY) ×2 IMPLANT
PENCIL SMOKE EVACUATOR (MISCELLANEOUS) IMPLANT
PLUG CATH AND CAP STRL 200 (CATHETERS) ×2 IMPLANT
RELOAD STAPLE 45 4.1 GRN THCK (STAPLE) ×2 IMPLANT
SCISSORS MNPLR CVD DVNC XI (INSTRUMENTS) ×2 IMPLANT
SEAL UNIV 5-12 XI (MISCELLANEOUS) ×8 IMPLANT
SET TUBE SMOKE EVAC HIGH FLOW (TUBING) ×2 IMPLANT
SOL PREP POV-IOD 4OZ 10% (MISCELLANEOUS) ×2 IMPLANT
SOLUTION ELECTROSURG ANTI STCK (MISCELLANEOUS) ×2 IMPLANT
SPIKE FLUID TRANSFER (MISCELLANEOUS) ×2 IMPLANT
SUT ETHILON 3 0 PS 1 (SUTURE) ×2 IMPLANT
SUT MNCRL 3 0 RB1 (SUTURE) ×2 IMPLANT
SUT MNCRL 3 0 VIOLET RB1 (SUTURE) ×2 IMPLANT
SUT MNCRL AB 4-0 PS2 18 (SUTURE) ×4 IMPLANT
SUT PDS PLUS AB 0 CT-2 (SUTURE) ×4 IMPLANT
SUT VIC AB 0 CT1 27XBRD ANTBC (SUTURE) ×4 IMPLANT
SUT VIC AB 2-0 SH 27X BRD (SUTURE) ×2 IMPLANT
SUT VIC AB 3-0 SH 27X BRD (SUTURE) IMPLANT
SYR 27GX1/2 1ML LL SAFETY (SYRINGE) ×2 IMPLANT
TROCAR Z THREAD OPTICAL 12X100 (TROCAR) IMPLANT
TUBING CONNECTING 10 (TUBING) ×2 IMPLANT
WATER STERILE IRR 1000ML POUR (IV SOLUTION) ×2 IMPLANT
WATER STERILE IRR 3000ML UROMA (IV SOLUTION) ×2 IMPLANT

## 2023-08-01 NOTE — Anesthesia Postprocedure Evaluation (Signed)
 Anesthesia Post Note  Patient: Allen [name]  Procedure(s) Performed: PROSTATECTOMY, RADICAL, ROBOT-ASSISTED, LAPAROSCOPIC LYMPHADENECTOMY, PELVIS, ROBOT-ASSISTED (Bilateral)     Patient location during evaluation: PACU Anesthesia Type: General Level of consciousness: awake and alert Pain management: pain level controlled Vital Signs Assessment: post-procedure vital signs reviewed and stable Respiratory status: spontaneous breathing, nonlabored ventilation, respiratory function stable and patient connected to nasal cannula oxygen  Cardiovascular status: blood pressure returned to baseline and stable Postop Assessment: no apparent nausea or vomiting Anesthetic complications: no   No notable events documented.  Last Vitals:  Vitals:   08/01/23 1400 08/01/23 1426  BP: 122/72 (!) 151/83  Pulse: 73 87  Resp: 11 16  Temp:  36.6 C  SpO2: 96% 98%    Last Pain:  Vitals:   08/01/23 1610  TempSrc:   PainSc: 4                  Franky JONETTA Bald

## 2023-08-01 NOTE — Op Note (Signed)
 Preoperative diagnosis: Clinically localized adenocarcinoma of the prostate (clinical stage T1c N0 M0)  Postoperative diagnosis: Clinically localized adenocarcinoma of the prostate (clinical stage T1c N0 M0)  Procedure:  Robotic assisted laparoscopic radical prostatectomy (bilateral nerve sparing) Bilateral robotic assisted laparoscopic pelvic lymphadenectomy  Surgeon: Gretel CANDIE Renda Mickey. M.D.  Assistant: Alan Hammonds, PA-C  An assistant was required for this surgical procedure.  The duties of the assistant included but were not limited to suctioning, passing suture, camera manipulation, retraction. This procedure would not be able to be performed without an Geophysicist/field seismologist.  Resident: Dr. Jacqulyn Bound  Anesthesia: General  Complications: None  EBL: 250 mL  IVF:  1200 mL crystalloid  Specimens: Prostate and seminal vesicles Right pelvic lymph nodes Left pelvic lymph nodes  Disposition of specimens: Pathology  Drains: 20 Fr coude catheter # 19 Blake pelvic drain  Indication: Allen [name] is a 73 y.o. year old patient with clinically localized prostate cancer.  After a thorough review of the management options for treatment of prostate cancer, he elected to proceed with surgical therapy and the above procedure(s).  We have discussed the potential benefits and risks of the procedure, side effects of the proposed treatment, the likelihood of the patient achieving the goals of the procedure, and any potential problems that might occur during the procedure or recuperation. Informed consent has been obtained.  Description of procedure:  The patient was taken to the operating room and a general anesthetic was administered. He was given preoperative antibiotics, placed in the dorsal lithotomy position, and prepped and draped in the usual sterile fashion. Next a preoperative timeout was performed. A urethral catheter was placed into the bladder and a site was selected near the umbilicus  for placement of the camera port. This was placed using a standard open Hassan technique which allowed entry into the peritoneal cavity under direct vision and without difficulty. An 8 mm robotic port was placed and a pneumoperitoneum established. The camera was then used to inspect the abdomen and there was no evidence of any intra-abdominal injuries or other abnormalities. The remaining abdominal ports were then placed. 8 mm robotic ports were placed in the right lower quadrant, left lower quadrant, and far left lateral abdominal wall. A 5 mm port was placed in the right upper quadrant and a 12 mm port was placed in the right lateral abdominal wall for laparoscopic assistance. All ports were placed under direct vision without difficulty. The surgical cart was then docked.   Utilizing the cautery scissors, the bladder was reflected posteriorly allowing entry into the space of Retzius and identification of the endopelvic fascia and prostate. The periprostatic fat was then removed from the prostate allowing full exposure of the endopelvic fascia. The endopelvic fascia was then incised from the apex back to the base of the prostate bilaterally and the underlying levator muscle fibers were swept laterally off the prostate thereby isolating the dorsal venous complex. The dorsal vein was then stapled and divided with a 45 mm Flex Echelon stapler. Attention then turned to the bladder neck which was divided anteriorly thereby allowing entry into the bladder and exposure of the urethral catheter. The catheter balloon was deflated and the catheter was brought into the operative field and used to retract the prostate anteriorly. The posterior bladder neck was then examined and was divided allowing further dissection between the bladder and prostate posteriorly until the vasa deferentia and seminal vessels were identified. The vasa deferentia were isolated, divided, and lifted anteriorly. The seminal vesicles  were dissected  down to their tips with care to control the seminal vascular arterial blood supply. These structures were then lifted anteriorly and the space between Denonvillier's fascia and the anterior rectum was developed with a combination of sharp and blunt dissection. This isolated the vascular pedicles of the prostate.  The lateral prostatic fascia was then sharply incised allowing release of the neurovascular bundles bilaterally. The vascular pedicles of the prostate were then ligated with Weck clips between the prostate and neurovascular bundles and divided with sharp cold scissor dissection resulting in neurovascular bundle preservation. The neurovascular bundles were then separated off the apex of the prostate and urethra bilaterally.  The urethra was then sharply transected allowing the prostate specimen to be disarticulated. The pelvis was copiously irrigated and hemostasis was ensured. There was no evidence for rectal injury.  Attention then turned to the right pelvic sidewall. The fibrofatty tissue between the external iliac vein, confluence of the iliac vessels, hypogastric artery, and Cooper's ligament was dissected free from the pelvic sidewall with care to preserve the obturator nerve. Weck clips were used for lymphostasis and hemostasis. An identical procedure was performed on the contralateral side and the lymphatic packets were removed for permanent pathologic analysis.  Attention then turned to the urethral anastomosis. A 2-0 Vicryl slip knot was placed between Denonvillier's fascia, the posterior bladder neck, and the posterior urethra to reapproximate these structures. A double-armed 3-0 Monocryl suture was then used to perform a 360 running tension-free anastomosis between the bladder neck and urethra. A new urethral catheter was then placed into the bladder and irrigated. There were no blood clots within the bladder and the anastomosis appeared to be watertight. A #19 Blake drain was then  brought through the left lateral 8 mm port site and positioned appropriately within the pelvis. It was secured to the skin with a nylon suture. The surgical cart was then undocked. The right lateral 12 mm port site was closed at the fascial level with a 0 Vicryl suture placed laparoscopically. All remaining ports were then removed under direct vision. The prostate specimen was removed intact within the Endopouch retrieval bag via the periumbilical camera port site. This fascial opening was closed with two running 0 PDS sutures. 0.25% Marcaine  was then injected into all port sites and all incisions were reapproximated at the skin level with 4-0 Monocryl subcuticular sutures and Dermabond. The patient appeared to tolerate the procedure well and without complications. The patient was able to be extubated and transferred to the recovery unit in satisfactory condition.   Gretel CANDIE Renda Teddie MD

## 2023-08-01 NOTE — Progress Notes (Signed)
 Patient ID: Allen [name], male   DOB: 04-26-1950, 73 y.o.   MRN: [id number]  Post-op note  Subjective: The patient is doing well.  No complaints.  Objective: Vital signs in last 24 hours: Temp:  [97.6 F (36.4 C)-98.2 F (36.8 C)] 97.9 F (36.6 C) (07/17 1426) Pulse Rate:  [69-91] 87 (07/17 1426) Resp:  [8-25] 16 (07/17 1426) BP: (103-160)/(72-93) 151/83 (07/17 1426) SpO2:  [94 %-100 %] 98 % (07/17 1426) Weight:  [104.5 kg] 104.5 kg (07/17 0624)  Intake/Output from previous day: No intake/output data recorded. Intake/Output this shift: Total I/O In: 2941.7 [I.V.:1841.7; IV Piggyback:1100] Out: 295 [Drains:45; Blood:250]  Physical Exam:  General: Alert and oriented. Abdomen: Soft, Nondistended. Incisions: Clean and dry.  Lab Results: Recent Labs    08/01/23 1235  HGB 12.4*  HCT 39.4    Assessment/Plan: POD#0   1) Continue to monitor, ambulate, IS   Gretel CANDIE Renda Mickey. MD   LOS: 0 days   Noretta Renda 08/01/2023, 5:17 PM

## 2023-08-01 NOTE — Interval H&P Note (Signed)
 History and Physical Interval Note:  08/01/2023 6:57 AM  Allen [name]  has presented today for surgery, with the diagnosis of PROSTATE CANCER.  The various methods of treatment have been discussed with the patient and family. After consideration of risks, benefits and other options for treatment, the patient has consented to  Procedure(s) with comments: PROSTATECTOMY, RADICAL, ROBOT-ASSISTED, LAPAROSCOPIC (N/A) - LEVEL 3 LYMPHADENECTOMY, PELVIS, ROBOT-ASSISTED (Bilateral) CYSTOSCOPY, WITH URETHRAL DILATION (N/A) as a surgical intervention.  The patient's history has been reviewed, patient examined, no change in status, stable for surgery.  I have reviewed the patient's chart and labs.  Questions were answered to the patient's satisfaction.     [employer]

## 2023-08-01 NOTE — Discharge Instructions (Signed)
 Activity:  You are encouraged to ambulate frequently (about every hour during waking hours) to help prevent blood clots from forming in your legs or lungs.  However, you should not engage in any heavy lifting (> 10-15 lbs), strenuous activity, or straining. Diet: You should continue a clear liquid diet until passing gas from below.  Once this occurs, you may advance your diet to a soft diet that would be easy to digest (i.e soups, scrambled eggs, mashed potatoes, etc.) for 24 hours just as you would if getting over a bad stomach flu.  If tolerating this diet well for 24 hours, you may then begin eating regular food.  It will be normal to have some amount of bloating, nausea, and abdominal discomfort intermittently. Prescriptions:  You will be provided a prescription for pain medication to take as needed.  If your pain is not severe enough to require the prescription pain medication, you may take Tylenol instead.  You should also take an over the counter stool softener (Colace 100 mg twice daily) to avoid straining with bowel movements as the pain medication may constipate you. Finally, you will also be provided a prescription for an antibiotic to begin the day prior to your return visit in the office for catheter removal. Catheter care: You will be taught how to take care of the catheter by the nursing staff prior to discharge from the hospital.  You may use both a leg bag and the larger bedside bag but it is recommended to at least use the bigger bedside bag at nighttime as the leg bag is small and will fill up overnight and also does not drain as well when lying flat. You may periodically feel a strong urge to void with the catheter in place.  This is a bladder spasm and most often can occur when having a bowel movement or when you are moving around. It is typically self-limited and usually will stop after a few minutes.  You may use some Vaseline or Neosporin around the tip of the catheter to reduce friction  at the tip of the penis. Incisions: You may remove your dressing bandages the 2nd day after surgery.  You most likely will have a few small staples in each of the incisions and once the bandages are removed, the incisions may stay open to air.  You may start showering (not soaking or bathing in water) 48 hours after surgery and the incisions simply need to be patted dry after the shower.  No additional care is needed. What to call us about: You should call the office [phone]) if you develop fever > 101, persistent vomiting, or the catheter stops draining. Also, feel free to call with any other questions you may have and remember the handout that was provided to you as a reference preoperatively which answers many of the common questions that arise after surgery. You may resume aspirin, advil, aleve, vitamins, and supplements 7 days after surgery.

## 2023-08-01 NOTE — Transfer of Care (Signed)
 Immediate Anesthesia Transfer of Care Note  Patient: Allen [name]  Procedure(s) Performed: PROSTATECTOMY, RADICAL, ROBOT-ASSISTED, LAPAROSCOPIC LYMPHADENECTOMY, PELVIS, ROBOT-ASSISTED (Bilateral)  Patient Location: PACU  Anesthesia Type:General  Level of Consciousness: drowsy and patient cooperative  Airway & Oxygen  Therapy: Patient Spontanous Breathing and Patient connected to face mask oxygen   Post-op Assessment: Report given to RN and Post -op Vital signs reviewed and stable  Post vital signs: Reviewed and stable  Last Vitals:  Vitals Value Taken Time  BP 147/83 08/01/23 11:31  Temp    Pulse 86 08/01/23 11:36  Resp 14 08/01/23 11:36  SpO2 95 % 08/01/23 11:36  Vitals shown include unfiled device data.  Last Pain:  Vitals:   08/01/23 0624  TempSrc:   PainSc: 0-No pain      Patients Stated Pain Goal: 5 (08/01/23 9375)  Complications: No notable events documented.

## 2023-08-01 NOTE — Anesthesia Procedure Notes (Signed)
 Procedure Name: Intubation Date/Time: 08/01/2023 7:45 AM  Performed by: Obadiah Reyes BROCKS, CRNAPre-anesthesia Checklist: Patient identified, Emergency Drugs available, Suction available and Patient being monitored Patient Re-evaluated:Patient Re-evaluated prior to induction Oxygen  Delivery Method: Circle System Utilized Preoxygenation: Pre-oxygenation with 100% oxygen  Induction Type: IV induction Ventilation: Mask ventilation without difficulty Laryngoscope Size: Miller and 2 Grade View: Grade I Tube type: Oral Number of attempts: 1 Airway Equipment and Method: Stylet and Oral airway Placement Confirmation: ETT inserted through vocal cords under direct vision, positive ETCO2 and breath sounds checked- equal and bilateral Secured at: 22 cm Tube secured with: Tape Dental Injury: Teeth and Oropharynx as per pre-operative assessment

## 2023-08-02 ENCOUNTER — Encounter (HOSPITAL_COMMUNITY): Payer: Self-pay | Admitting: Urology

## 2023-08-02 DIAGNOSIS — Z79899 Other long term (current) drug therapy: Secondary | ICD-10-CM | POA: Diagnosis not present

## 2023-08-02 DIAGNOSIS — J449 Chronic obstructive pulmonary disease, unspecified: Secondary | ICD-10-CM | POA: Diagnosis not present

## 2023-08-02 DIAGNOSIS — I5032 Chronic diastolic (congestive) heart failure: Secondary | ICD-10-CM | POA: Diagnosis not present

## 2023-08-02 DIAGNOSIS — Z87891 Personal history of nicotine dependence: Secondary | ICD-10-CM | POA: Diagnosis not present

## 2023-08-02 DIAGNOSIS — C61 Malignant neoplasm of prostate: Secondary | ICD-10-CM | POA: Diagnosis not present

## 2023-08-02 LAB — HEMOGLOBIN AND HEMATOCRIT, BLOOD
HCT: 33 % — ABNORMAL LOW (ref 39.0–52.0)
Hemoglobin: 10.6 g/dL — ABNORMAL LOW (ref 13.0–17.0)

## 2023-08-02 MED ORDER — BISACODYL 10 MG RE SUPP
10.0000 mg | Freq: Once | RECTAL | Status: AC
  Administered 2023-08-02: 10 mg via RECTAL
  Filled 2023-08-02: qty 1

## 2023-08-02 MED ORDER — TRAMADOL HCL 50 MG PO TABS: 50.0000 mg | ORAL_TABLET | Freq: Four times a day (QID) | ORAL | Status: DC | PRN

## 2023-08-02 NOTE — Progress Notes (Signed)
 Patient ID: Allen [name], male   DOB: 06-Jan-1951, 73 y.o.   MRN: [id number]  1 Day Post-Op Subjective: The patient is doing well.  No nausea or vomiting. Pain is adequately controlled.  Objective: Vital signs in last 24 hours: Temp:  [97.6 F (36.4 C)-98.7 F (37.1 C)] 98.7 F (37.1 C) (07/18 0412) Pulse Rate:  [72-91] 72 (07/18 0412) Resp:  [8-25] 18 (07/18 0412) BP: (103-160)/(59-93) 128/69 (07/18 0412) SpO2:  [94 %-100 %] 96 % (07/18 0731)  Intake/Output from previous day: 07/17 0701 - 07/18 0700 In: 4604.3 [I.V.:3454.3; IV Piggyback:1150] Out: 1815 [Urine:1400; Drains:165; Blood:250] Intake/Output this shift: No intake/output data recorded.  Physical Exam:  General: Alert and oriented. CV: RRR Lungs: Clear bilaterally. GI: Soft, Nondistended. Incisions: Clean, dry, and intact Urine: Clear Extremities: Nontender, no erythema, no edema.  Lab Results: Recent Labs    08/01/23 1235 08/02/23 0347  HGB 12.4* 10.6*  HCT 39.4 33.0*      Assessment/Plan: POD# 1 s/p robotic prostatectomy.  1) SL IVF 2) Ambulate, Incentive spirometry 3) Transition to oral pain medication 4) Dulcolax suppository 5) D/C pelvic drain 6) Plan for likely discharge later today   Gretel CANDIE Renda Mickey. MD   LOS: 0 days   Noretta Renda 08/02/2023, 7:37 AM

## 2023-08-02 NOTE — Progress Notes (Signed)
   08/02/23 1218  TOC Brief Assessment  Insurance and Status Reviewed  Patient has primary care physician Yes  Home environment has been reviewed Resides in an apartment with spouse  Prior level of function: Independent with ADLs at baseline  Prior/Current Home Services No current home services  Social Drivers of Health Review SDOH reviewed no interventions necessary  Readmission risk has been reviewed Yes  Transition of care needs no transition of care needs at this time

## 2023-08-02 NOTE — Care Management Obs Status (Signed)
 MEDICARE OBSERVATION STATUS NOTIFICATION   Patient Details  Name: Allen [name] MRN: [id number] Date of Birth: May 08, 1950   Medicare Observation Status Notification Given:  Yes    Duwaine GORMAN Aran, LCSW 08/02/2023, 12:17 PM

## 2023-08-02 NOTE — Discharge Summary (Signed)
 Date of admission: 08/01/2023  Date of discharge: 08/02/2023  Admission diagnosis: Clinically localized adenocarcinoma of the prostate  Discharge diagnosis: Same  Secondary diagnoses:  Patient Active Problem List   Diagnosis Date Noted   Prostate cancer (HCC) 08/01/2023   Cervical spondylosis 05/29/2022   Mild obstructive sleep apnea 05/29/2022   Effusion of left knee 04/18/2022   Degenerative arthritis of left knee 03/28/2022   Hoarseness 01/18/2022   Laryngopharyngeal reflux (LPR) 01/18/2022   MCI (mild cognitive impairment) with memory loss 09/29/2021   Chronic respiratory failure with hypoxia (HCC) 09/01/2020   BPH with obstruction/lower urinary tract symptoms 08/09/2020   S/P deep brain stimulator placement 03/25/2020   Tremor 03/25/2020   Pulmonary nodule less than 6 mm in diameter with high risk for malignant neoplasm 03/09/2020   Injury of axilla 03/03/2020   Body mass index (BMI) 32.0-32.9, adult 02/22/2020   Cervical radiculopathy 02/22/2020   Elevated blood-pressure reading, without diagnosis of hypertension 02/22/2020   Neck pain 02/22/2020   Greater trochanteric bursitis of right hip 11/18/2019   Degenerative disc disease, cervical 07/01/2019   Degenerative disc disease, lumbar 07/01/2019   Nonallopathic lesion of lumbar region 07/01/2019   Varicose veins of both lower extremities 06/23/2019   Chronic diastolic CHF (congestive heart failure) (HCC) 06/02/2019   Perennial allergic rhinitis probable nonallergic component 06/01/2019   Cough, persistent 06/01/2019   Vitamin D  deficiency 05/07/2019   Moderate persistent asthma without complication 04/10/2019   Allergic rhinitis 04/10/2019   Shortness of breath 04/10/2019   GERD (gastroesophageal reflux disease)    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(HCC)    Right carpal tunnel syndrome    Essential tremor    Insomnia    ED (erectile dysfunction)    Ulcerative colitis (HCC) 06/30/2014    Procedures  performed: Procedure(s): PROSTATECTOMY, RADICAL, ROBOT-ASSISTED, LAPAROSCOPIC LYMPHADENECTOMY, PELVIS, ROBOT-ASSISTED  History and Physical: For full details, please see admission history and physical. Briefly, Allen [name] is a 73 y.o. year old patient with clinically localized prostate cancer presenting for radical prostatectomy.   Hospital Course: Patient tolerated the procedure well.  He was then transferred to the floor after an uneventful PACU stay.  His hospital course was uncomplicated.  On POD#1 he had met discharge criteria: was eating a regular diet, was up and ambulating independently,  pain was well controlled, catheter draining without issue, and was ready to for discharge.   Laboratory values:  Recent Labs    08/01/23 1235 08/02/23 0347  HGB 12.4* 10.6*  HCT 39.4 33.0*   No results for input(s): NA, K, CL, CO2, GLUCOSE, BUN, CREATININE, CALCIUM in the last 72 hours. No results for input(s): LABPT, INR in the last 72 hours. No results for input(s): LABURIN in the last 72 hours. Results for orders placed or performed during the hospital encounter of 04/26/23  Urine Culture (for pregnant, neutropenic or urologic patients or patients with an indwelling urinary catheter)     Status: None   Collection Time: 04/26/23  5:15 PM   Specimen: Urine, Catheterized  Result Value Ref Range Status   Specimen Description   Final    URINE, CATHETERIZED Performed at [facility], [address], [place], [place] [address]    Special Requests   Final    NONE Performed at [facility], [address], [place], [place] [address]    Culture   Final    NO GROWTH Performed at [facility], [address], [place], [place] [address]  Report Status 04/27/2023 FINAL  Final    Physical Exam  Gen: NAD Resp: Satting well on RA Card: Regular rate Abd: Soft, appropriately tender, ND, incision clean dry and intact GU:  Foley catheter in place draining clear yellow urine. Neuro: Alert   Disposition: Home  Discharge instruction: The patient was instructed to be ambulatory but told to refrain from heavy lifting, strenuous activity, or driving.   Discharge medications:  Allergies as of 08/02/2023       Reactions   Copper-containing Compounds    Copper gloves caused redness and blisters bilateral hands        Medication List     STOP taking these medications    Cyanocobalamin  2500 MCG Subl   FISH OIL PO   HYDROcodone  bit-homatropine 5-1.5 MG/5ML syrup Commonly known as: HYCODAN   methylPREDNISolone  4 MG Tbpk tablet Commonly known as: MEDROL  DOSEPAK   phenazopyridine  200 MG tablet Commonly known as: PYRIDIUM    PreserVision AREDS Tabs   tamsulosin  0.4 MG Caps capsule Commonly known as: FLOMAX    TURMERIC PO   Vitamin D3 125 MCG (5000 UT) Tabs   Wegovy  0.25 MG/0.5ML Soaj Generic drug: Semaglutide -Weight Management       TAKE these medications    albuterol  108 (90 Base) MCG/ACT inhaler Commonly known as: VENTOLIN  HFA Inhale 1-2 puffs into the lungs every 4 (four) hours as needed for wheezing or shortness of breath.   Azelastine -Fluticasone  137-50 MCG/ACT Susp Place 1 spray into the nose in the morning and at bedtime. What changed:  when to take this reasons to take this   busPIRone  30 MG tablet Commonly known as: BUSPAR  Take 30 mg by mouth at bedtime.   carbidopa -levodopa  25-100 MG tablet Commonly known as: SINEMET  IR Take 1 tablet by mouth 3 (three) times daily.   ciprofloxacin  500 MG tablet Commonly known as: Cipro  Take 1 tablet (500 mg total) by mouth 2 (two) times daily. Start day prior to office visit for foley removal   docusate sodium  100 MG capsule Commonly known as: COLACE Take 1 capsule (100 mg total) by mouth 2 (two) times daily.   escitalopram  10 MG tablet Commonly known as: LEXAPRO  Take 10 mg by mouth daily.   gabapentin  300 MG capsule Commonly  known as: NEURONTIN  Take 1 capsule (300 mg total) by mouth 3 (three) times daily.   montelukast  10 MG tablet Commonly known as: SINGULAIR  Take 1 tablet (10 mg total) by mouth at bedtime.   mupirocin ointment 2 % Commonly known as: BACTROBAN Apply 1 Application topically 3 (three) times daily as needed (irritation).   pantoprazole  40 MG tablet Commonly known as: PROTONIX  TAKE 1 TABLET (40 MG TOTAL) BY MOUTH TWICE A DAY BEFORE MEALS   tadalafil  20 MG tablet Commonly known as: CIALIS  Take 1 tablet (20 mg total) by mouth daily as needed for erectile dysfunction. What changed:  how much to take when to take this   traMADol  50 MG tablet Commonly known as: Ultram  Take 1-2 tablets (50-100 mg total) by mouth every 6 (six) hours as needed for moderate pain (pain score 4-6) or severe pain (pain score 7-10).   traZODone  100 MG tablet Commonly known as: DESYREL  TAKE 1 TABLET AT BEDTIME   Trelegy Ellipta  200-62.5-25 MCG/ACT Aepb Generic drug: Fluticasone -Umeclidin-Vilant Inhale 1 each into the lungs daily.   Trelegy Ellipta  200-62.5-25 MCG/ACT Aepb Generic drug: Fluticasone -Umeclidin-Vilant Inhale 1 puff into the lungs daily.   triamcinolone  0.025 % cream Commonly known as: KENALOG  Apply 1 Application topically 2 (  two) times daily as needed (irritation).        Followup:   Follow-up Information     Renda Glance, MD Follow up on 08/07/2023.   Specialty: Urology Why: at 12:30 Contact information: [address] [place] [address] [phone]

## 2023-08-07 LAB — SURGICAL PATHOLOGY

## 2023-08-21 ENCOUNTER — Ambulatory Visit: Admitting: Podiatry

## 2023-08-21 ENCOUNTER — Encounter: Payer: Self-pay | Admitting: Podiatry

## 2023-08-21 VITALS — Ht 70.0 in | Wt 230.0 lb

## 2023-08-21 DIAGNOSIS — G629 Polyneuropathy, unspecified: Secondary | ICD-10-CM

## 2023-08-21 DIAGNOSIS — M7752 Other enthesopathy of left foot: Secondary | ICD-10-CM | POA: Diagnosis not present

## 2023-08-21 MED ORDER — GABAPENTIN 300 MG PO CAPS: 300.0000 mg | ORAL_CAPSULE | Freq: Three times a day (TID) | ORAL | 3 refills | Status: DC

## 2023-08-23 NOTE — Progress Notes (Signed)
 Subjective:   Patient ID: Allen [name], male   DOB: 73 y.o.   MRN: [id number]   HPI Patient continues to experience tingling in his feet and has developed pain around the first metatarsal phalangeal joint and plantar joint.  States that it is becoming more bothersome since we saw him last   ROS      Objective:  Physical Exam  Neurovascular status unchanged from previous with inflammation pain of the plantar first MPJ with fluid buildup in this area and upon questioning moderate neurological deficit associated with condition     Assessment:  2 separate conditions with 1 being inflammatory capsulitis of the first MPJ and secondarily the probability for neuropathy bilateral     Plan:  H&P reviewed both conditions.  For the neuropathy at this point I have recommended gabapentin  number to start him on low-dose 1 at night to see how he tolerates it and can add 1 morning 1 midday and may have to have the dose increased over time which I discussed with him.  I did do sterile prep and injected the plantar capsule first MPJ 3 mg dexamethasone  Kenalog  5 mg Xylocaine  to try to help him from the sample

## 2023-08-25 ENCOUNTER — Other Ambulatory Visit: Payer: Self-pay | Admitting: Primary Care

## 2023-09-02 DIAGNOSIS — M62838 Other muscle spasm: Secondary | ICD-10-CM | POA: Diagnosis not present

## 2023-09-02 DIAGNOSIS — M6281 Muscle weakness (generalized): Secondary | ICD-10-CM | POA: Diagnosis not present

## 2023-09-17 DIAGNOSIS — M79642 Pain in left hand: Secondary | ICD-10-CM | POA: Diagnosis not present

## 2023-09-18 DIAGNOSIS — M6281 Muscle weakness (generalized): Secondary | ICD-10-CM | POA: Diagnosis not present

## 2023-09-18 DIAGNOSIS — M62838 Other muscle spasm: Secondary | ICD-10-CM | POA: Diagnosis not present

## 2023-09-26 ENCOUNTER — Ambulatory Visit: Payer: Medicare (Managed Care) | Admitting: Podiatry

## 2023-09-26 DIAGNOSIS — M19042 Primary osteoarthritis, left hand: Secondary | ICD-10-CM | POA: Diagnosis not present

## 2023-09-26 DIAGNOSIS — M1852 Other unilateral secondary osteoarthritis of first carpometacarpal joint, left hand: Secondary | ICD-10-CM | POA: Diagnosis not present

## 2023-09-26 DIAGNOSIS — M65312 Trigger thumb, left thumb: Secondary | ICD-10-CM | POA: Diagnosis not present

## 2023-10-02 ENCOUNTER — Encounter: Payer: Self-pay | Admitting: Internal Medicine

## 2023-10-02 MED ORDER — TIRZEPATIDE-WEIGHT MANAGEMENT 2.5 MG/0.5ML ~~LOC~~ SOAJ: 2.5000 mg | SUBCUTANEOUS | 0 refills | Status: DC

## 2023-10-07 ENCOUNTER — Other Ambulatory Visit: Payer: Self-pay | Admitting: Internal Medicine

## 2023-10-07 DIAGNOSIS — E66811 Obesity, class 1: Secondary | ICD-10-CM

## 2023-10-07 MED ORDER — TIRZEPATIDE-WEIGHT MANAGEMENT 2.5 MG/0.5ML ~~LOC~~ SOLN: 2.5000 mg | SUBCUTANEOUS | 0 refills | Status: DC

## 2023-10-08 ENCOUNTER — Encounter (HOSPITAL_BASED_OUTPATIENT_CLINIC_OR_DEPARTMENT_OTHER): Payer: Self-pay | Admitting: Cardiology

## 2023-10-08 ENCOUNTER — Ambulatory Visit (HOSPITAL_BASED_OUTPATIENT_CLINIC_OR_DEPARTMENT_OTHER): Admitting: Cardiology

## 2023-10-08 VITALS — BP 118/78 | HR 74 | Ht 70.0 in | Wt 241.4 lb

## 2023-10-08 DIAGNOSIS — Z712 Person consulting for explanation of examination or test findings: Secondary | ICD-10-CM | POA: Diagnosis not present

## 2023-10-08 DIAGNOSIS — E7800 Pure hypercholesterolemia, unspecified: Secondary | ICD-10-CM

## 2023-10-08 DIAGNOSIS — I700 Atherosclerosis of aorta: Secondary | ICD-10-CM

## 2023-10-08 DIAGNOSIS — Z7189 Other specified counseling: Secondary | ICD-10-CM

## 2023-10-08 MED ORDER — ROSUVASTATIN CALCIUM 5 MG PO TABS: 5.0000 mg | ORAL_TABLET | Freq: Every day | ORAL | 3 refills | Status: DC

## 2023-10-08 NOTE — Progress Notes (Signed)
 Cardiology Office Note:  .   Date:  10/08/2023  ID:  Allen [name], DOB March 18, 1950, MRN [id number] PCP: Theophilus Andrews, Tully GRADE, MD  [facility] Providers Cardiologist:  Shelda Bruckner, MD {  History of Present Illness: .   Allen [name] is a 73 y.o. male with a hx of shortness of breath, LE edema who is seen for follow up today. I initially met him 05/26/19 as a new consult at the request of Theophilus Andrews, Jonna* for the evaluation and management of shortness of breath, enlarged heart on CT scan.   Cardiovascular risk factors: Tobacco use history: former, quit >30 years ago Family history: father died of myocarditis at age 56, heart was good prior. No other heart issues. Many with lung issues. Mother died of COPD. Father and grandfather had bad asthma.  Prior cardiac testing and/or incidental findings on other testing: had [place] several years ago, told his vessels were clear. Cath done due to shortness of breath and leg swelling. Noted enlarged heart on recent CT.   Has COPD and a chronic cough. Has OSA, uses CPAP.   Has chronic bilateral LE edema. Has had three prior vein surgeries, the earliest of which was in his 30s.   Today: Recently diagnosed with prostate cancer, underwent robotic radical prostatectomy in July. Reviewed finding from PET scan that showed aortic atherosclerosis. We reviewed this at length.  About to start GLP1, discussed at length.  ROS: Denies chest pain, shortness of breath at rest or with normal exertion. No PND, orthopnea, LE edema or unexpected weight gain. No syncope or palpitations. ROS otherwise negative except as noted.   Studies Reviewed: SABRA    EKG:       Physical Exam:   VS:  BP 118/78   Pulse 74   Ht 5' 10 (1.778 m)   Wt 241 lb 6.4 oz (109.5 kg)   SpO2 95%   BMI 34.64 kg/m    Wt Readings from Last 3 Encounters:  10/08/23 241 lb 6.4 oz (109.5 kg)  08/21/23 230 lb (104.3 kg)  08/01/23 230 lb 6.4 oz (104.5  kg)    GEN: Well nourished, well developed in no acute distress HEENT: Normal, moist mucous membranes NECK: No JVD CARDIAC: regular rhythm, normal S1 and S2, no rubs or gallops. No murmur. VASCULAR: Radial and DP pulses 2+ bilaterally. No carotid bruits RESPIRATORY:  Clear to auscultation without rales, wheezing or rhonchi  ABDOMEN: Soft, non-tender, non-distended MUSCULOSKELETAL:  Ambulates independently SKIN: Warm and dry, no edema NEUROLOGIC:  Alert and oriented x 3. No focal neuro deficits noted. PSYCHIATRIC:  Normal affect    ASSESSMENT AND PLAN: .    Aortic atherosclerosis on PET  -without coronary calcification, with prior clean cath in 2016 -lipids have historically been well controlled. LDL 94, HDL 73, TG 93.  -after shared decision making, will start low dose rosuvastatin . Recheck lipids/LFTs in 3 mos, goal LDL <70. -about to start Zepbound  cash pay. Tried compounded semaglutide  without weight loss. BMI 34. Discussed CV risk data on GLP  Abnormal CT (trace pericardial effusion and concern for cardiomegaly) -echo was reassuring   Lower extremity edema Varicose veins -echo supports that this is not cardiac in etiology -we discussed venous insufficiency at length, recommendations for management -has seen vascular  CV risk counseling and prevention -recommend heart healthy/Mediterranean diet, with whole grains, fruits, vegetable, fish, lean meats, nuts, and olive oil. Limit salt. -recommend moderate walking, 3-5 times/week for 30-50 minutes each session. Aim  for at least 150 minutes/week. Goal should be pace of 3 miles/hours, or walking 1.5 miles in 30 minutes -recommend avoidance of tobacco products. Avoid excess alcohol.  Dispo: lipids/lfts in 3 mos, follow up in 1 year.  Signed, Shelda Bruckner, MD   Shelda Bruckner, MD, PhD, [facility]  [facility]  [facility] [address] [place], [place] [address] [phone]

## 2023-10-08 NOTE — Patient Instructions (Addendum)
 Medication Instructions:  Your physician has recommended you make the following change in your medication:  Start Crestor  (rosuvastatin ) 5 mg - take one tablet daily  *If you need a refill on your cardiac medications before your next appointment, please call your pharmacy*  Lab Work: Return in 3 months - lipids, Lp(a) If you have labs (blood work) drawn today and your tests are completely normal, you will receive your results only by: MyChart Message (if you have MyChart) OR A paper copy in the mail If you have any lab test that is abnormal or we need to change your treatment, we will call you to review the results.  Testing/Procedures: none  Follow-Up: At [facility], you and your health needs are our priority.  As part of our continuing mission to provide you with exceptional heart care, our providers are all part of one team.  This team includes your primary Cardiologist (physician) and Advanced Practice Providers or APPs (Physician Assistants and Nurse Practitioners) who all work together to provide you with the care you need, when you need it.  Your next appointment:   12 month(s)  Provider:   Shelda Bruckner, MD, Rosaline Bane, NP, or Reche Finder, NP

## 2023-10-09 DIAGNOSIS — C44321 Squamous cell carcinoma of skin of nose: Secondary | ICD-10-CM | POA: Diagnosis not present

## 2023-10-09 DIAGNOSIS — L728 Other follicular cysts of the skin and subcutaneous tissue: Secondary | ICD-10-CM | POA: Diagnosis not present

## 2023-10-09 DIAGNOSIS — D492 Neoplasm of unspecified behavior of bone, soft tissue, and skin: Secondary | ICD-10-CM | POA: Diagnosis not present

## 2023-10-24 ENCOUNTER — Encounter: Payer: Self-pay | Admitting: Internal Medicine

## 2023-10-24 ENCOUNTER — Other Ambulatory Visit: Payer: Self-pay | Admitting: Internal Medicine

## 2023-10-24 DIAGNOSIS — E6609 Other obesity due to excess calories: Secondary | ICD-10-CM

## 2023-10-24 MED ORDER — TIRZEPATIDE-WEIGHT MANAGEMENT 5 MG/0.5ML ~~LOC~~ SOLN: 5.0000 mg | SUBCUTANEOUS | 0 refills | Status: DC

## 2023-10-29 ENCOUNTER — Ambulatory Visit: Admitting: Family Medicine

## 2023-10-29 DIAGNOSIS — Z0000 Encounter for general adult medical examination without abnormal findings: Secondary | ICD-10-CM | POA: Diagnosis not present

## 2023-10-29 NOTE — Progress Notes (Signed)
 PATIENT CHECK-IN and HEALTH RISK ASSESSMENT QUESTIONNAIRE:  -completed by phone/video for upcoming Medicare Preventive Visit  Pre-Visit Check-in: 1)Vitals (height, wt, BP, etc) - record in vitals section for visit on day of visit Request home vitals (wt, BP, etc.) and enter into vitals, THEN update Vital Signs SmartPhrase below at the top of the HPI. See below.  2)Review and Update Medications, Allergies PMH, Surgeries, Social history in Epic 3)Hospitalizations in the last year with date/reason?  Yes for prostate surgery  4)Review and Update Care Team (patient's specialists) in Epic 5) Complete PHQ9 in Epic  6) Complete Fall Screening in Epic 7)Review all Health Maintenance Due and order if not done.  Medicare Wellness Patient Questionnaire:  Answer theses question about your habits: How often do you have a drink containing alcohol? daily How many drinks containing alcohol do you have on a typical day when you are drinking?1 glass of wine How often do you have six or more drinks on one occasion?n Have you ever smoked?y Quit date if applicable? Quit in 1990  How many packs a day do/did you smoke? na Do you use smokeless tobacco?n Do you use an illicit drugs?n On average, how many days per week do you engage in moderate to strenuous exercise (like a brisk walk)? daily times, has to go light because of torn ACL  On average, how many minutes do you engage in exercise at this level?10 minutes cycling daily plus some walks with the dog Typical breakfast: regular oats, oat-milk, blueberries, greek yogurt Typical lunch: skips lunch Typical dinner: chicken, veggies, salad every night Typical snacks:none currently  Beverages: no soda, water  and lemon  Answer theses question about your everyday activities: Can you perform most household chores?y Are you deaf or have significant trouble hearing?n Do you feel that you have a problem with memory?n Do you feel safe at home?y Last dentist  visit?has dentures 8. Do you have any difficulty performing your everyday activities?n Are you having any difficulty walking, taking medications on your own, and or difficulty managing daily home needs?n Do you have difficulty walking or climbing stairs?n Do you have difficulty dressing or bathing?n Do you have difficulty doing errands alone such as visiting a doctor's office or shopping?n Do you currently have any difficulty preparing food and eating?n Do you currently have any difficulty using the toilet?n Do you have any difficulty managing your finances?n Do you have any difficulties with housekeeping of managing your housekeeping?n   Do you have Advanced Directives in place (Living Will, Healthcare Power or Attorney)? y   Last eye Exam and location? Goes once a year to see Glendia Gaudy   Do you currently use prescribed or non-prescribed narcotic or opioid pain medications?n  Do you have a history or close family history of breast, ovarian, tubal or peritoneal cancer or a family member with BRCA (breast cancer susceptibility 1 and 2) gene mutations? See pmh, FH    ----------------------------------------------------------------------------------------------------------------------------------------------------------------------------------------------------------------------  Because this visit was a virtual/telehealth visit, some criteria may be missing or patient reported. Any vitals not documented were not able to be obtained and vitals that have been documented are patient reported.    MEDICARE ANNUAL PREVENTIVE CARE VISIT WITH PROVIDER (Welcome to Medicare, initial annual wellness or annual wellness exam)  Virtual Visit via Video Note  I connected with Allen [name] on 10/29/23  by a video enabled telemedicine application and verified that I am speaking with the correct person using two identifiers.  Location patient: home Location provider:work or home  office Persons  participating in the virtual visit: patient, provider  Concerns and/or follow up today: reports things are stable currently. He is seeing dermatologist in a few days for a skin cancer on his nose. He feels is doing well after prostate surgery - has healed well. Sees Dr. Renda in November for follow up.  On Zepbound  for wt loss and has had 5 lb weight reduction and is happy about this.    See HM section in Epic for other details of completed HM.    ROS: negative for report of fevers, unintentional weight loss, vision changes, vision loss, hearing loss or change, chest pain, sob, hemoptysis, melena, hematochezia, hematuria, falls, bleeding or bruising, thoughts of suicide or self harm, memory loss  Patient-completed extensive health risk assessment - reviewed and discussed with the patient: See Health Risk Assessment completed with patient prior to the visit either above or in recent phone note. This was reviewed in detailed with the patient today and appropriate recommendations, orders and referrals were placed as needed per Summary below and patient instructions.   Review of Medical History: -PMH, PSH, Family History and current specialty and care providers reviewed and updated and listed below   Patient Care Team: Theophilus Andrews, Tully GRADE, MD as PCP - General (Internal Medicine) Lonni Slain, MD as PCP - Cardiology (Cardiology) Tat, Asberry RAMAN, DO as Consulting Physician (Neurology) Liane Sharyne MATSU, [facility] (Inactive) as Pharmacist (Pharmacist) Livingston Rigg, MD as Consulting Physician (Dermatology)   Past Medical History:  Diagnosis Date   Anxiety    Arthritis    neck -   BPH with obstruction/lower urinary tract symptoms    urologist-- dr pace   Cancer [facility])    basal cell to nose   Centrilobular emphysema (HCC)    followed by dr shellia   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[facility])    followed by dr shellia   Cough 07/2020   covid residual, no congestion   Depression    DOE (dyspnea on  exertion)    due to copd/ emphysema   ED (erectile dysfunction)    Essential tremor    neurologist--- dr tat----  bilateral upper extremities,  s/p DPS 12/ 2016   GERD (gastroesophageal reflux disease)    History of 2019 novel coronavirus disease (COVID-19) 07/18/2020   per pt postive home test 07-18-2020 w/ mild  symptoms per pt; pt stated had urgent care visit 07-24-2020 for sob/ cough with negative cxr (in care everywhere) ;  follow up pcp visit 08-03-2020 in epic   Insomnia    Mixed simple and mucopurulent chronic bronchitis (HCC)    OSA (obstructive sleep apnea)    followed by dr shellia---- no longer does uses bipap, intolerant --- pt prescriped  Oxygen   2 L oxygen  via [place] while sleeping  (study in epic 04/ 2017 [facility])   Perennial allergic rhinitis    Pneumonia    Prostate cancer (HCC)    Pulmonary nodules    followed by dr shellia   S/P deep brain stimulator placement 12/2014   followed by neurology-----IPG change 01-21-2020 for end-of life battery  (pt has a control)   Varicose vein of leg    per pt has had 3 procedure's last one 2006   Wears dentures    upper    Past Surgical History:  Procedure Laterality Date   ANKLE SURGERY Right 2020   tendon repair   APPENDECTOMY     CARDIAC CATHETERIZATION     CARPAL TUNNEL RELEASE Left 2019  CATARACT EXTRACTION W/ INTRAOCULAR LENS IMPLANT Bilateral 2020   COLONOSCOPY     CYSTOSCOPY WITH URETHRAL DILATATION N/A 08/29/2021   Procedure: CYSTOSCOPY WITH URETHRAL DILATATION;  Surgeon: Elisabeth Valli BIRCH, MD;  Location: [facility];  Service: Urology;  Laterality: N/A;   DEEP BRAIN STIMULATOR PLACEMENT Left 2016   INGUINAL HERNIA REPAIR Right 2007   KNEE ARTHROSCOPY W/ MENISCECTOMY Right 06/25/2022   ROBOT ASSISTED LAPAROSCOPIC RADICAL PROSTATECTOMY N/A 08/01/2023   Procedure: PROSTATECTOMY, RADICAL, ROBOT-ASSISTED, LAPAROSCOPIC;  Surgeon: Renda Glance, MD;  Location: [facility];  Service: Urology;  Laterality: N/A;  LEVEL 3   ROTATOR CUFF REPAIR  Right 2014   SUBTHALAMIC STIMULATOR BATTERY REPLACEMENT N/A 01/21/2020   Procedure: Deep brain stimulator battery replacement;  Surgeon: Unice Pac, MD;  Location: [facility];  Service: Neurosurgery;  Laterality: N/A;   TONSILLECTOMY AND ADENOIDECTOMY     age 73   TOTAL SHOULDER REPLACEMENT Left 2016   TRANSURETHRAL RESECTION OF PROSTATE N/A 08/09/2020   Procedure: TRANSURETHRAL RESECTION OF THE PROSTATE (TURP), BLADDER BIOPSY AND FULGERATION;  Surgeon: Elisabeth Valli BIRCH, MD;  Location: [facility];  Service: Urology;  Laterality: N/A;   TRANSURETHRAL RESECTION OF PROSTATE N/A 08/29/2021   Procedure: RE-TRANSURETHRAL RESECTION OF THE PROSTATE (TURP);  Surgeon: Elisabeth Valli BIRCH, MD;  Location: [facility];  Service: Urology;  Laterality: N/A;   VEIN SURGERY Left 2006   varicose veins    Social History   Socioeconomic History   Marital status: Married    Spouse name: Not on file   Number of children: 3   Years of education: Not on file   Highest education level: Associate degree: academic program  Occupational History   Not on file  Tobacco Use   Smoking status: Former    Current packs/day: 0.00    Average packs/day: 3.0 packs/day for 20.0 years (60.0 ttl pk-yrs)    Types: Cigarettes    Start date: 44    Quit date: 4    Years since quitting: 34.8    Passive exposure: Never   Smokeless tobacco: Never  Vaping Use   Vaping status: Never Used  Substance and Sexual Activity   Alcohol use: Yes    Alcohol/week: 1.0 standard drink of alcohol    Types: 1 Glasses of wine per week    Comment: occassionally   Drug use: Never   Sexual activity: Yes  Other Topics Concern   Not on file  Social History Narrative   Right handed   Drinks caffeine   One level home   Retired   Lives with wife   Social Drivers of Corporate investment banker Strain: Low Risk  (10/29/2023)   Overall Financial Resource Strain (CARDIA)    Difficulty of Paying Living Expenses: Not hard at  all  Food Insecurity: No Food Insecurity (10/29/2023)   Hunger Vital Sign    Worried About Running Out of Food in the Last Year: Never true    Ran Out of Food in the Last Year: Never true  Transportation Needs: No Transportation Needs (10/29/2023)   PRAPARE - Administrator, Civil Service (Medical): No    Lack of Transportation (Non-Medical): No  Physical Activity: Insufficiently Active (10/29/2023)   Exercise Vital Sign    Days of Exercise per Week: 1 day    Minutes of Exercise per Session: 10 min  Stress: No Stress Concern Present (10/29/2023)   [employer] of Occupational Health - Occupational Stress Questionnaire    Feeling of Stress:  Not at all  Social Connections: Socially Isolated (10/29/2023)   Social Connection and Isolation Panel    Frequency of Communication with Friends and Family: Once a week    Frequency of Social Gatherings with Friends and Family: Once a week    Attends Religious Services: Never    Database administrator or Organizations: No    Attends Engineer, structural: Not on file    Marital Status: Married  Catering manager Violence: Not At Risk (08/01/2023)   Humiliation, Afraid, Rape, and Kick questionnaire    Fear of Current or Ex-Partner: No    Emotionally Abused: No    Physically Abused: No    Sexually Abused: No    Family History  Problem Relation Age of Onset   COPD Mother    Asthma Father    Rectal cancer Sister    Healthy Child    Urticaria Neg Hx    Immunodeficiency Neg Hx    Eczema Neg Hx    Atopy Neg Hx    Angioedema Neg Hx    Allergic rhinitis Neg Hx    Colon cancer Neg Hx    Stomach cancer Neg Hx    Esophageal cancer Neg Hx     Current Outpatient Medications on File Prior to Visit  Medication Sig Dispense Refill   tirzepatide  5 MG/0.5ML injection vial Inject 5 mg into the skin once a week. 2 mL 0   albuterol  (VENTOLIN  HFA) 108 (90 Base) MCG/ACT inhaler USE 1 TO 2 INHALATIONS BY MOUTH  EVERY 4 HOURS AS  NEEDED FOR  WHEEZING OR SHORTNESS OF BREATH 108 g 2   Azelastine -Fluticasone  137-50 MCG/ACT SUSP Place 1 spray into the nose in the morning and at bedtime. 23 g 5   busPIRone  (BUSPAR ) 30 MG tablet Take 30 mg by mouth at bedtime.     clonazePAM (KLONOPIN) 0.5 MG tablet Take 0.5 mg by mouth daily as needed.     escitalopram  (LEXAPRO ) 10 MG tablet Take 10 mg by mouth daily.     HYDROcodone -acetaminophen  (NORCO/VICODIN) 5-325 MG tablet Take 1 tablet by mouth every 8 (eight) hours as needed (pain).     montelukast  (SINGULAIR ) 10 MG tablet Take 1 tablet (10 mg total) by mouth at bedtime. 90 tablet 3   mupirocin ointment (BACTROBAN) 2 % Apply 1 Application topically 3 (three) times daily as needed (irritation).     rosuvastatin  (CRESTOR ) 5 MG tablet Take 1 tablet (5 mg total) by mouth daily. 90 tablet 3   tadalafil  (CIALIS ) 20 MG tablet Take 1 tablet (20 mg total) by mouth daily as needed for erectile dysfunction. 90 tablet 3   traZODone  (DESYREL ) 100 MG tablet TAKE 1 TABLET AT BEDTIME 90 tablet 1   TRELEGY ELLIPTA  200-62.5-25 MCG/ACT AEPB USE 1 INHALATION BY MOUTH DAILY 180 each 3   triamcinolone  (KENALOG ) 0.025 % cream Apply 1 Application topically 2 (two) times daily as needed (irritation).     VITAMIN D  PO Take 1 capsule by mouth daily.     zaleplon (SONATA) 10 MG capsule Take 10 mg by mouth at bedtime.     No current facility-administered medications on file prior to visit.    Allergies  Allergen Reactions   Copper-Containing Compounds     Copper gloves caused redness and blisters bilateral hands       Physical Exam Vitals requested from patient and listed below if patient had equipment and was able to obtain at home for this virtual visit: There were no vitals  filed for this visit. Estimated body mass index is 34.64 kg/m as calculated from the following:   Height as of 10/08/23: 5' 10 (1.778 m).   Weight as of 10/08/23: 241 lb 6.4 oz (109.5 kg).  EKG (optional): deferred due to  virtual visit  GENERAL: alert, oriented, no acute distress detected; full vision exam deferred due to pandemic and/or virtual encounter   HEENT: atraumatic, conjunttiva clear, no obvious abnormalities on inspection of external nose and ears  NECK: normal movements of the head and neck  LUNGS: on inspection no signs of respiratory distress, breathing rate appears normal, no obvious gross SOB, gasping or wheezing  CV: no obvious cyanosis  MS: moves all visible extremities without noticeable abnormality  PSYCH/NEURO: pleasant and cooperative, no obvious depression or anxiety, speech and thought processing grossly intact, Cognitive function grossly intact  Flowsheet Row Office Visit from 03/22/2023 in [facility] [place]  PHQ-9 Total Score 1        10/29/2023   10:08 AM 03/22/2023    2:50 PM 09/06/2022   10:22 AM 07/25/2022    9:31 AM 11/17/2021    8:30 AM  Depression screen PHQ 2/9  Decreased Interest 0 0 0 0 0  Down, Depressed, Hopeless 0 0 0 0 0  PHQ - 2 Score 0 0 0 0 0  Altered sleeping  0 0 0 0  Tired, decreased energy  1 0 1 0  Change in appetite  0 0 0 0  Feeling bad or failure about yourself   0 0 0 0  Trouble concentrating  0 0 0 0  Moving slowly or fidgety/restless  0 0 0 0  Suicidal thoughts  0 0 0 0  PHQ-9 Score  1 0 1 0  Difficult doing work/chores  Not difficult at all Not difficult at all Not difficult at all Not difficult at all       03/22/2023    2:44 PM 04/22/2023    1:19 PM 04/22/2023    1:22 PM 10/29/2023    8:31 AM 10/29/2023   10:08 AM  Fall Risk  Falls in the past year? 0 0 0 0 0  Was there an injury with Fall? 0   0 0  Fall Risk Category Calculator 0   0  0  Patient at Risk for Falls Due to No Fall Risks      Fall risk Follow up Falls evaluation completed         Patient-reported     SUMMARY AND PLAN:  Medicare annual wellness visit, subsequent  Discussed applicable health maintenance/preventive health measures and  advised and referred or ordered per patient preferences: -discussed vaccines due, he reports he had his covid and flu vaccines in September and had his pneumonia vaccine last year at the pharmacy - he agrees to send report of dates/immunizations received so that we can update his record Health Maintenance  Topic Date Due   Pneumococcal Vaccine: 50+ Years (2 of 2 - PCV) 12/16/2018   Influenza Vaccine  08/16/2023   COVID-19 Vaccine (8 - 2025-26 season) 09/16/2023   Medicare Annual Wellness (AWV)  10/28/2024   Colonoscopy  07/31/2025   DTaP/Tdap/Td (3 - Td or Tdap) 04/05/2031   Hepatitis C Screening  Completed   Zoster Vaccines- Shingrix  Completed   Meningococcal B Vaccine  Aged Out     Education and counseling on the following was provided based on the above review of health and a plan/checklist for the patient,  along with additional information discussed, was provided for the patient in the patient instructions :   -Advised and counseled on a healthy lifestyle  -Reviewed patient's current diet. Advised and counseled on a whole foods based healthy diet. Congratulated on healthy changes he has made. Discussed getting adequate protein and nutrients while on weight loss medication. Reviewed diet. Discussed replacing current plant milk with less processed plant milk or making his own. A summary of a healthy diet was provided in the Patient Instructions.  -reviewed patient's current physical activity level and discussed exercise guidelines for adults. Discussed  ideas for safe exercise at home to assist in meeting exercise guideline recommendations in a safe and healthy way. Further resources provided in patient instructions. -Advise yearly dental visits at minimum and regular eye exams -Advised and counseled on alcohol safe limits, risks Follow up: see patient instructions   Patient Instructions  I really enjoyed getting to talk with you today! I am available on Tuesdays and Thursdays for  virtual visits if you have any questions or concerns, or if I can be of any further assistance.   CHECKLIST FROM ANNUAL WELLNESS VISIT:  -Follow up (please call to schedule if not scheduled after visit):   - keep appointment with your doctors as scheduled   -yearly for annual wellness visit with primary care office  Here is a list of your preventive care/health maintenance measures and the plan for each if any are due:  PLAN For any measures below that may be due:    1. Please provide proof of vaccines administered elsewhere so that we can update your immunization record. Thanks!  Health Maintenance  Topic Date Due   Pneumococcal Vaccine: 50+ Years (2 of 2 - PCV) 12/16/2018   Influenza Vaccine  08/16/2023   COVID-19 Vaccine (8 - 2025-26 season) 09/16/2023   Medicare Annual Wellness (AWV)  10/28/2024   Colonoscopy  07/31/2025   DTaP/Tdap/Td (3 - Td or Tdap) 04/05/2031   Hepatitis C Screening  Completed   Zoster Vaccines- Shingrix  Completed   Meningococcal B Vaccine  Aged Out    -See a dentist at least yearly  -Get your eyes checked and then per your eye specialist's recommendations  -Other issues addressed today:   -I have included below further information regarding a healthy whole foods based diet, physical activity guidelines for adults, stress management and opportunities for social connections. I hope you find this information useful.   -----------------------------------------------------------------------------------------------------------------------------------------------------------------------------------------------------------------------------------------------------------    NUTRITION: -eat real food: lots of colorful vegetables (half the plate) and fruits -5-7 servings of vegetables and fruits per day (fresh or steamed is best), exp. 2 servings of vegetables with lunch and dinner and 2 servings of fruit per day. Berries and greens such as kale and  collards are great choices.  -consume on a regular basis:  fresh fruits, fresh veggies, fish, nuts, seeds, healthy oils (such as olive oil, avocado oil), whole grains (make sure for bread/pasta/crackers/etc., that the first ingredient on label contains the word whole), legumes. -can eat small amounts of dairy and lean meat (no larger than the palm of your hand), but avoid processed meats such as ham, bacon, lunch meat, etc. -drink water  -try to avoid fast food and pre-packaged foods, processed meat, ultra processed foods/beverages (donuts, candy, etc.) -most experts advise limiting sodium to < 2300mg  per day, should limit further is any chronic conditions such as high blood pressure, heart disease, diabetes, etc. The American Heart Association advised that < 1500mg  is is ideal -try  to avoid foods/beverages that contain any ingredients with names you do not recognize  -try to avoid foods/beverages  with added sugar or sweeteners/sweets  -try to avoid sweet drinks (including diet drinks): soda, juice, Gatorade, sweet tea, power drinks, diet drinks -try to avoid white rice, white bread, pasta (unless whole grain)  EXERCISE GUIDELINES FOR ADULTS: -if you wish to increase your physical activity, do so gradually and with the approval of your doctor -STOP and seek medical care immediately if you have any chest pain, chest discomfort or trouble breathing when starting or increasing exercise  -move and stretch your body, legs, feet and arms when sitting for long periods -Physical activity guidelines for optimal health in adults: -get at least 150 minutes per week of moderate exercise (can talk, but not sing); this is about 20-30 minutes of sustained activity 5-7 days per week or two 10-15 minute episodes of sustained activity 5-7 days per week -do some muscle building/resistance training/strength training at least 2 days per week  -balance exercises 3+ days per week:   Stand somewhere where you have  something sturdy to hold onto if you lose balance    1) lift up on toes, then back down, start with 5x per day and work up to 20x   2) stand and lift one leg straight out to the side so that foot is a few inches of the floor, start with 5x each side and work up to 20x each side   3) stand on one foot, start with 5 seconds each side and work up to 20 seconds on each side  If you need ideas or help with getting more active:  -Silver sneakers https://tools.silversneakers.com  -Walk with a Doc: http://www.duncan-williams.com/  -try to include resistance (weight lifting/strength building) and balance exercises twice per week: or the following link for ideas: http://castillo-powell.com/  BuyDucts.dk  STRESS MANAGEMENT: -can try meditating, or just sitting quietly with deep breathing while intentionally relaxing all parts of your body for 5 minutes daily -if you need further help with stress, anxiety or depression please follow up with your primary doctor or contact the wonderful folks at [facility]: [phone]  SOCIAL CONNECTIONS: -options in [place] if you wish to engage in more social and exercise related activities:  -Silver sneakers https://tools.silversneakers.com  -Walk with a Doc: http://www.duncan-williams.com/  -Check out the [facility] Active Adults 50+ section on the [place] [employer] (hiking clubs, book clubs, cards and games, chess, exercise classes, aquatic classes and much more) - see the website for details: https://www.[place].gov/departments/parks-recreation/active-adults50  -YouTube has lots of exercise videos for different ages and abilities as well  -[facility] (a variety of indoor and outdoor inperson activities for adults). ([phone]. [address]  -Virtual Online Classes (a variety of topics): see seniorplanet.org or call  [phone]  -consider volunteering at a school, hospice center, church, senior center or elsewhere            Chiquita JONELLE Cramp, DO

## 2023-10-29 NOTE — Patient Instructions (Signed)
 I really enjoyed getting to talk with you today! I am available on Tuesdays and Thursdays for virtual visits if you have any questions or concerns, or if I can be of any further assistance.   CHECKLIST FROM ANNUAL WELLNESS VISIT:  -Follow up (please call to schedule if not scheduled after visit):   - keep appointment with your doctors as scheduled   -yearly for annual wellness visit with primary care office  Here is a list of your preventive care/health maintenance measures and the plan for each if any are due:  PLAN For any measures below that may be due:    1. Please provide proof of vaccines administered elsewhere so that we can update your immunization record. Thanks!  Health Maintenance  Topic Date Due   Pneumococcal Vaccine: 50+ Years (2 of 2 - PCV) 12/16/2018   Influenza Vaccine  08/16/2023   COVID-19 Vaccine (8 - 2025-26 season) 09/16/2023   Medicare Annual Wellness (AWV)  10/28/2024   Colonoscopy  07/31/2025   DTaP/Tdap/Td (3 - Td or Tdap) 04/05/2031   Hepatitis C Screening  Completed   Zoster Vaccines- Shingrix  Completed   Meningococcal B Vaccine  Aged Out    -See a dentist at least yearly  -Get your eyes checked and then per your eye specialist's recommendations  -Other issues addressed today:   -I have included below further information regarding a healthy whole foods based diet, physical activity guidelines for adults, stress management and opportunities for social connections. I hope you find this information useful.   -----------------------------------------------------------------------------------------------------------------------------------------------------------------------------------------------------------------------------------------------------------    NUTRITION: -eat real food: lots of colorful vegetables (half the plate) and fruits -5-7 servings of vegetables and fruits per day (fresh or steamed is best), exp. 2 servings of vegetables with  lunch and dinner and 2 servings of fruit per day. Berries and greens such as kale and collards are great choices.  -consume on a regular basis:  fresh fruits, fresh veggies, fish, nuts, seeds, healthy oils (such as olive oil, avocado oil), whole grains (make sure for bread/pasta/crackers/etc., that the first ingredient on label contains the word whole), legumes. -can eat small amounts of dairy and lean meat (no larger than the palm of your hand), but avoid processed meats such as ham, bacon, lunch meat, etc. -drink water  -try to avoid fast food and pre-packaged foods, processed meat, ultra processed foods/beverages (donuts, candy, etc.) -most experts advise limiting sodium to < 2300mg  per day, should limit further is any chronic conditions such as high blood pressure, heart disease, diabetes, etc. The American Heart Association advised that < 1500mg  is is ideal -try to avoid foods/beverages that contain any ingredients with names you do not recognize  -try to avoid foods/beverages  with added sugar or sweeteners/sweets  -try to avoid sweet drinks (including diet drinks): soda, juice, Gatorade, sweet tea, power drinks, diet drinks -try to avoid white rice, white bread, pasta (unless whole grain)  EXERCISE GUIDELINES FOR ADULTS: -if you wish to increase your physical activity, do so gradually and with the approval of your doctor -STOP and seek medical care immediately if you have any chest pain, chest discomfort or trouble breathing when starting or increasing exercise  -move and stretch your body, legs, feet and arms when sitting for long periods -Physical activity guidelines for optimal health in adults: -get at least 150 minutes per week of moderate exercise (can talk, but not sing); this is about 20-30 minutes of sustained activity 5-7 days per week or two 10-15 minute episodes of  sustained activity 5-7 days per week -do some muscle building/resistance training/strength training at least 2 days  per week  -balance exercises 3+ days per week:   Stand somewhere where you have something sturdy to hold onto if you lose balance    1) lift up on toes, then back down, start with 5x per day and work up to 20x   2) stand and lift one leg straight out to the side so that foot is a few inches of the floor, start with 5x each side and work up to 20x each side   3) stand on one foot, start with 5 seconds each side and work up to 20 seconds on each side  If you need ideas or help with getting more active:  -Silver sneakers https://tools.silversneakers.com  -Walk with a Doc: http://www.duncan-williams.com/  -try to include resistance (weight lifting/strength building) and balance exercises twice per week: or the following link for ideas: http://castillo-powell.com/  BuyDucts.dk  STRESS MANAGEMENT: -can try meditating, or just sitting quietly with deep breathing while intentionally relaxing all parts of your body for 5 minutes daily -if you need further help with stress, anxiety or depression please follow up with your primary doctor or contact the wonderful folks at [facility]: [phone]  SOCIAL CONNECTIONS: -options in [place] if you wish to engage in more social and exercise related activities:  -Silver sneakers https://tools.silversneakers.com  -Walk with a Doc: http://www.duncan-williams.com/  -Check out the [facility] Active Adults 50+ section on the [place] [employer] (hiking clubs, book clubs, cards and games, chess, exercise classes, aquatic classes and much more) - see the website for details: https://www.[place]-.gov/departments/parks-recreation/active-adults50  -YouTube has lots of exercise videos for different ages and abilities as well  -[facility] (a variety of indoor and outdoor inperson activities for adults). (727)400-1951. [address]  -Virtual Online Classes (a variety of topics): see seniorplanet.org or call [phone])[phone]  -consider volunteering at a school, hospice center, church, senior center or elsewhere

## 2023-11-05 DIAGNOSIS — M170 Bilateral primary osteoarthritis of knee: Secondary | ICD-10-CM | POA: Diagnosis not present

## 2023-11-06 ENCOUNTER — Encounter: Payer: Self-pay | Admitting: Internal Medicine

## 2023-11-06 ENCOUNTER — Ambulatory Visit (INDEPENDENT_AMBULATORY_CARE_PROVIDER_SITE_OTHER): Admitting: Internal Medicine

## 2023-11-06 VITALS — BP 120/70 | HR 80 | Temp 98.1°F | Ht 69.5 in | Wt 234.1 lb

## 2023-11-06 DIAGNOSIS — N401 Enlarged prostate with lower urinary tract symptoms: Secondary | ICD-10-CM

## 2023-11-06 DIAGNOSIS — N138 Other obstructive and reflux uropathy: Secondary | ICD-10-CM | POA: Diagnosis not present

## 2023-11-06 DIAGNOSIS — Z23 Encounter for immunization: Secondary | ICD-10-CM | POA: Diagnosis not present

## 2023-11-06 DIAGNOSIS — G250 Essential tremor: Secondary | ICD-10-CM | POA: Diagnosis not present

## 2023-11-06 DIAGNOSIS — N529 Male erectile dysfunction, unspecified: Secondary | ICD-10-CM

## 2023-11-06 DIAGNOSIS — Z0000 Encounter for general adult medical examination without abnormal findings: Secondary | ICD-10-CM

## 2023-11-06 DIAGNOSIS — I5032 Chronic diastolic (congestive) heart failure: Secondary | ICD-10-CM | POA: Diagnosis not present

## 2023-11-06 DIAGNOSIS — E559 Vitamin D deficiency, unspecified: Secondary | ICD-10-CM | POA: Diagnosis not present

## 2023-11-06 LAB — CBC WITH DIFFERENTIAL/PLATELET
Basophils Absolute: 0 K/uL (ref 0.0–0.1)
Basophils Relative: 0.1 % (ref 0.0–3.0)
Eosinophils Absolute: 0 K/uL (ref 0.0–0.7)
Eosinophils Relative: 0 % (ref 0.0–5.0)
HCT: 41.1 % (ref 39.0–52.0)
Hemoglobin: 13.4 g/dL (ref 13.0–17.0)
Lymphocytes Relative: 7.1 % — ABNORMAL LOW (ref 12.0–46.0)
Lymphs Abs: 0.6 K/uL — ABNORMAL LOW (ref 0.7–4.0)
MCHC: 32.6 g/dL (ref 30.0–36.0)
MCV: 97.2 fl (ref 78.0–100.0)
Monocytes Absolute: 0.2 K/uL (ref 0.1–1.0)
Monocytes Relative: 2.5 % — ABNORMAL LOW (ref 3.0–12.0)
Neutro Abs: 7.1 K/uL (ref 1.4–7.7)
Neutrophils Relative %: 90.3 % — ABNORMAL HIGH (ref 43.0–77.0)
Platelets: 319 K/uL (ref 150.0–400.0)
RBC: 4.23 Mil/uL (ref 4.22–5.81)
RDW: 14.3 % (ref 11.5–15.5)
WBC: 7.8 K/uL (ref 4.0–10.5)

## 2023-11-06 LAB — LIPID PANEL
Cholesterol: 154 mg/dL (ref 0–200)
HDL: 93 mg/dL (ref >39.00)
LDL Cholesterol: 49 mg/dL (ref 0–99)
NonHDL: 60.56
Total CHOL/HDL Ratio: 2
Triglycerides: 56 mg/dL (ref 0.0–149.0)
VLDL: 11.2 mg/dL (ref 0.0–40.0)

## 2023-11-06 LAB — COMPREHENSIVE METABOLIC PANEL WITH GFR
ALT: 18 U/L (ref 0–53)
AST: 17 U/L (ref 0–37)
Albumin: 4.8 g/dL (ref 3.5–5.2)
Alkaline Phosphatase: 50 U/L (ref 39–117)
BUN: 20 mg/dL (ref 6–23)
CO2: 25 meq/L (ref 19–32)
Calcium: 9.3 mg/dL (ref 8.4–10.5)
Chloride: 101 meq/L (ref 96–112)
Creatinine, Ser: 1.21 mg/dL (ref 0.40–1.50)
GFR: 59.48 mL/min — ABNORMAL LOW (ref >60.00)
Glucose, Bld: 132 mg/dL — ABNORMAL HIGH (ref 70–99)
Potassium: 4.6 meq/L (ref 3.5–5.1)
Sodium: 136 meq/L (ref 135–145)
Total Bilirubin: 0.3 mg/dL (ref 0.2–1.2)
Total Protein: 6.8 g/dL (ref 6.0–8.3)

## 2023-11-06 LAB — PSA: PSA: 0 ng/mL — ABNORMAL LOW (ref 0.10–4.00)

## 2023-11-06 LAB — VITAMIN D 25 HYDROXY (VIT D DEFICIENCY, FRACTURES): VITD: 38.09 ng/mL (ref 30.00–100.00)

## 2023-11-06 LAB — TSH: TSH: 0.52 u[IU]/mL (ref 0.35–5.50)

## 2023-11-06 LAB — VITAMIN B12: Vitamin B-12: 753 pg/mL (ref 211–911)

## 2023-11-06 MED ORDER — TADALAFIL 20 MG PO TABS: 20.0000 mg | ORAL_TABLET | Freq: Every day | ORAL | 3 refills | Status: AC

## 2023-11-06 NOTE — Addendum Note (Signed)
 Addended by: KATHRYNE MILLMAN B on: 11/06/2023 09:57 AM   Modules accepted: Orders

## 2023-11-06 NOTE — Progress Notes (Signed)
 Established Patient Office Visit     CC/Reason for Visit: Annual preventive exam  HPI: Allen [name] is a 73 y.o. male who is coming in today for the above mentioned reasons. Past Medical History is significant for:   1.  Essential tremor with a brain stimulator followed by Dr. Evonnie.   2.  COPD/asthma, followed by pulmonary   3.  Obstructive sleep apnea on BiPAP.   4.  Right carpal tunnel syndrome.   5.  Bilateral cataracts   6.  GERD causing vocal cord dysfunction.  He has been taking famotidine  in the morning and omeprazole  in the evening.   7.  He has had recurrent episodes of prostatitis followed by urology.   8.  He has been having a lot of depression.  He saw a psychiatrist who started him on Lexapro  and Ambien .   8.  He has cervical radiculopathy and disc disease.    Has no acute concerns or complaints.  Has routine eye and dental care.  Is due for PCV 20.  Colonoscopy is up-to-date.  He has a history of prostate cancer and has follow-up soon with his urologist.  Past Medical/Surgical History: Past Medical History:  Diagnosis Date   Anxiety    Arthritis    neck -   BPH with obstruction/lower urinary tract symptoms    urologist-- dr pace   Cancer [facility])    basal cell to nose   Centrilobular emphysema (HCC)    followed by dr shellia   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[facility])    followed by dr shellia   Cough 07/2020   covid residual, no congestion   Depression    DOE (dyspnea on exertion)    due to copd/ emphysema   ED (erectile dysfunction)    Essential tremor    neurologist--- dr tat----  bilateral upper extremities,  s/p DPS 12/ 2016   GERD (gastroesophageal reflux disease)    History of 2019 novel coronavirus disease (COVID-19) 07/18/2020   per pt postive home test 07-18-2020 w/ mild  symptoms per pt; pt stated had urgent care visit 07-24-2020 for sob/ cough with negative cxr (in care everywhere) ;  follow up pcp visit 08-03-2020 in epic   Insomnia    Mixed simple and  mucopurulent chronic bronchitis (HCC)    OSA (obstructive sleep apnea)    followed by dr shellia---- no longer does uses bipap, intolerant --- pt prescriped  Oxygen   2 L oxygen  via [place] while sleeping  (study in epic 04/ 2017 [facility])   Perennial allergic rhinitis    Pneumonia    Prostate cancer (HCC)    Pulmonary nodules    followed by dr shellia   S/P deep brain stimulator placement 12/2014   followed by neurology-----IPG change 01-21-2020 for end-of life battery  (pt has a control)   Varicose vein of leg    per pt has had 3 procedure's last one 2006   Wears dentures    upper    Past Surgical History:  Procedure Laterality Date   ANKLE SURGERY Right 2020   tendon repair   APPENDECTOMY     CARDIAC CATHETERIZATION     CARPAL TUNNEL RELEASE Left 2019   CATARACT EXTRACTION W/ INTRAOCULAR LENS IMPLANT Bilateral 2020   COLONOSCOPY     CYSTOSCOPY WITH URETHRAL DILATATION N/A 08/29/2021   Procedure: CYSTOSCOPY WITH URETHRAL DILATATION;  Surgeon: Elisabeth Valli BIRCH, MD;  Location: [facility];  Service: Urology;  Laterality: N/A;   DEEP BRAIN  STIMULATOR PLACEMENT Left 2016   INGUINAL HERNIA REPAIR Right 2007   KNEE ARTHROSCOPY W/ MENISCECTOMY Right 06/25/2022   ROBOT ASSISTED LAPAROSCOPIC RADICAL PROSTATECTOMY N/A 08/01/2023   Procedure: PROSTATECTOMY, RADICAL, ROBOT-ASSISTED, LAPAROSCOPIC;  Surgeon: Renda Glance, MD;  Location: [facility];  Service: Urology;  Laterality: N/A;  LEVEL 3   ROTATOR CUFF REPAIR Right 2014   SUBTHALAMIC STIMULATOR BATTERY REPLACEMENT N/A 01/21/2020   Procedure: Deep brain stimulator battery replacement;  Surgeon: Unice Pac, MD;  Location: [facility] OR;  Service: Neurosurgery;  Laterality: N/A;   TONSILLECTOMY AND ADENOIDECTOMY     age 89   TOTAL SHOULDER REPLACEMENT Left 2016   TRANSURETHRAL RESECTION OF PROSTATE N/A 08/09/2020   Procedure: TRANSURETHRAL RESECTION OF THE PROSTATE (TURP), BLADDER BIOPSY AND FULGERATION;  Surgeon: Elisabeth Valli BIRCH, MD;  [facility];  Service: Urology;  Laterality: N/A;   TRANSURETHRAL RESECTION OF PROSTATE N/A 08/29/2021   Procedure: RE-TRANSURETHRAL RESECTION OF THE PROSTATE (TURP);  Surgeon: Elisabeth Valli BIRCH, MD;  Location: [facility];  Service: Urology;  Laterality: N/A;   VEIN SURGERY Left 2006   varicose veins    Social History:  reports that he quit smoking about 34 years ago. His smoking use included cigarettes. He started smoking about 54 years ago. He has a 60 pack-year smoking history. He has never been exposed to tobacco smoke. He has never used smokeless tobacco. He reports current alcohol use of about 1.0 standard drink of alcohol per week. He reports that he does not use drugs.  Allergies: Allergies  Allergen Reactions   Copper-Containing Compounds     Copper gloves caused redness and blisters bilateral hands    Family History:  Family History  Problem Relation Age of Onset   COPD Mother    Asthma Father    Rectal cancer Sister    Healthy Child    Urticaria Neg Hx    Immunodeficiency Neg Hx    Eczema Neg Hx    Atopy Neg Hx    Angioedema Neg Hx    Allergic rhinitis Neg Hx    Colon cancer Neg Hx    Stomach cancer Neg Hx    Esophageal cancer Neg Hx      Current Outpatient Medications:    albuterol  (VENTOLIN  HFA) 108 (90 Base) MCG/ACT inhaler, USE 1 TO 2 INHALATIONS BY MOUTH  EVERY 4 HOURS AS NEEDED FOR  WHEEZING OR SHORTNESS OF BREATH, Disp: 108 g, Rfl: 2   Azelastine -Fluticasone  137-50 MCG/ACT SUSP, Place 1 spray into the nose in the morning and at bedtime., Disp: 23 g, Rfl: 5   busPIRone  (BUSPAR ) 30 MG tablet, Take 30 mg by mouth at bedtime., Disp: , Rfl:    clonazePAM (KLONOPIN) 0.5 MG tablet, Take 0.5 mg by mouth daily as needed., Disp: , Rfl:    escitalopram  (LEXAPRO ) 10 MG tablet, Take 10 mg by mouth daily., Disp: , Rfl:    montelukast  (SINGULAIR ) 10 MG tablet, Take 1 tablet (10 mg total) by mouth at bedtime., Disp: 90 tablet, Rfl: 3   mupirocin ointment (BACTROBAN) 2 %,  Apply 1 Application topically 3 (three) times daily as needed (irritation)., Disp: , Rfl:    primidone  (MYSOLINE ) 50 MG tablet, Take 50 mg by mouth. 3 tablets in the morning and 2 in the evening, Disp: , Rfl:    rosuvastatin  (CRESTOR ) 5 MG tablet, Take 1 tablet (5 mg total) by mouth daily., Disp: 90 tablet, Rfl: 3   tirzepatide  5 MG/0.5ML injection vial, Inject 5 mg into  the skin once a week., Disp: 2 mL, Rfl: 0   traZODone  (DESYREL ) 100 MG tablet, TAKE 1 TABLET AT BEDTIME, Disp: 90 tablet, Rfl: 1   TRELEGY ELLIPTA  200-62.5-25 MCG/ACT AEPB, USE 1 INHALATION BY MOUTH DAILY, Disp: 180 each, Rfl: 3   triamcinolone  (KENALOG ) 0.025 % cream, Apply 1 Application topically 2 (two) times daily as needed (irritation)., Disp: , Rfl:    VITAMIN D  PO, Take 1 capsule by mouth daily., Disp: , Rfl:    zaleplon (SONATA) 10 MG capsule, Take 10 mg by mouth at bedtime., Disp: , Rfl:    tadalafil  (CIALIS ) 20 MG tablet, Take 1 tablet (20 mg total) by mouth daily., Disp: 90 tablet, Rfl: 3  Review of Systems:  Negative unless indicated in HPI.   Physical Exam: Vitals:   11/06/23 0907  BP: 120/70  Pulse: 80  Temp: 98.1 F (36.7 C)  TempSrc: Oral  SpO2: 94%  Weight: 234 lb 1.6 oz (106.2 kg)  Height: 5' 9.5 (1.765 m)    Body mass index is 34.07 kg/m.   Physical Exam Vitals reviewed.  Constitutional:      General: He is not in acute distress.    Appearance: Normal appearance. He is obese. He is not ill-appearing, toxic-appearing or diaphoretic.  HENT:     Head: Normocephalic.     Right Ear: Tympanic membrane, ear canal and external ear normal. There is no impacted cerumen.     Left Ear: Tympanic membrane, ear canal and external ear normal. There is no impacted cerumen.     Nose: Nose normal.     Mouth/Throat:     Mouth: Mucous membranes are moist.     Pharynx: Oropharynx is clear. No oropharyngeal exudate or posterior oropharyngeal erythema.  Eyes:     General: No scleral icterus.       Right  eye: No discharge.        Left eye: No discharge.     Conjunctiva/sclera: Conjunctivae normal.  Neck:     Vascular: No carotid bruit.  Cardiovascular:     Rate and Rhythm: Normal rate and regular rhythm.     Pulses: Normal pulses.     Heart sounds: Normal heart sounds.  Pulmonary:     Effort: Pulmonary effort is normal. No respiratory distress.     Breath sounds: Normal breath sounds.  Abdominal:     General: Abdomen is flat. Bowel sounds are normal.     Palpations: Abdomen is soft.  Musculoskeletal:        General: Normal range of motion.     Cervical back: Normal range of motion.  Skin:    General: Skin is warm and dry.  Neurological:     General: No focal deficit present.     Mental Status: He is alert and oriented to person, place, and time. Mental status is at baseline.  Psychiatric:        Mood and Affect: Mood normal.        Behavior: Behavior normal.        Thought Content: Thought content normal.        Judgment: Judgment normal.      Impression and Plan:  Encounter for preventive health examination  BPH with obstruction/lower urinary tract symptoms -     PSA; Future  Vitamin D  deficiency -     Vitamin B12; Future -     VITAMIN D  25 Hydroxy (Vit-D Deficiency, Fractures); Future  Essential tremor  Chronic diastolic CHF (congestive heart failure) (HCC) -  CBC with Differential/Platelet; Future -     Comprehensive metabolic panel with GFR; Future -     Lipid panel; Future -     TSH; Future  Erectile dysfunction, unspecified erectile dysfunction type -     Tadalafil ; Take 1 tablet (20 mg total) by mouth daily.  Dispense: 90 tablet; Refill: 3   -Recommend routine eye and dental care. -Healthy lifestyle discussed in detail. -Labs to be updated today. -Prostate cancer screening: PSA today Health Maintenance  Topic Date Due   Pneumococcal Vaccine for age over 81 (2 of 2 - PCV) 12/16/2018   COVID-19 Vaccine (11 - Pfizer risk 2025-26 season) 04/05/2024    Medicare Annual Wellness Visit  10/28/2024   Colon Cancer Screening  07/31/2025   DTaP/Tdap/Td vaccine (3 - Td or Tdap) 04/05/2031   Flu Shot  Completed   Hepatitis C Screening  Completed   Zoster (Shingles) Vaccine  Completed   Meningitis B Vaccine  Aged Out     - PCV 20 in office today.     Tully Theophilus Andrews, MD [facility]

## 2023-11-07 ENCOUNTER — Ambulatory Visit: Payer: Self-pay | Admitting: Internal Medicine

## 2023-11-07 DIAGNOSIS — C44321 Squamous cell carcinoma of skin of nose: Secondary | ICD-10-CM | POA: Diagnosis not present

## 2023-11-18 ENCOUNTER — Emergency Department (INDEPENDENT_AMBULATORY_CARE_PROVIDER_SITE_OTHER)

## 2023-11-18 ENCOUNTER — Ambulatory Visit: Admitting: Internal Medicine

## 2023-11-18 ENCOUNTER — Emergency Department (HOSPITAL_BASED_OUTPATIENT_CLINIC_OR_DEPARTMENT_OTHER)
Admission: EM | Admit: 2023-11-18 | Discharge: 2023-11-18 | Disposition: A | Discharge status: Home / Self Care | Attending: Emergency Medicine | Admitting: Emergency Medicine

## 2023-11-18 ENCOUNTER — Other Ambulatory Visit: Payer: Self-pay | Admitting: Internal Medicine

## 2023-11-18 ENCOUNTER — Other Ambulatory Visit: Payer: Self-pay

## 2023-11-18 ENCOUNTER — Encounter (HOSPITAL_BASED_OUTPATIENT_CLINIC_OR_DEPARTMENT_OTHER): Payer: Self-pay | Admitting: Emergency Medicine

## 2023-11-18 DIAGNOSIS — I509 Heart failure, unspecified: Secondary | ICD-10-CM | POA: Diagnosis not present

## 2023-11-18 DIAGNOSIS — J4489 Other specified chronic obstructive pulmonary disease: Secondary | ICD-10-CM | POA: Insufficient documentation

## 2023-11-18 DIAGNOSIS — W182XXA Fall in (into) shower or empty bathtub, initial encounter: Secondary | ICD-10-CM | POA: Insufficient documentation

## 2023-11-18 DIAGNOSIS — J9811 Atelectasis: Secondary | ICD-10-CM | POA: Diagnosis not present

## 2023-11-18 DIAGNOSIS — S2241XA Multiple fractures of ribs, right side, initial encounter for closed fracture: Secondary | ICD-10-CM

## 2023-11-18 DIAGNOSIS — S299XXA Unspecified injury of thorax, initial encounter: Secondary | ICD-10-CM | POA: Diagnosis present

## 2023-11-18 DIAGNOSIS — W19XXXA Unspecified fall, initial encounter: Secondary | ICD-10-CM | POA: Diagnosis not present

## 2023-11-18 DIAGNOSIS — S3993XA Unspecified injury of pelvis, initial encounter: Secondary | ICD-10-CM | POA: Diagnosis present

## 2023-11-18 DIAGNOSIS — J90 Pleural effusion, not elsewhere classified: Secondary | ICD-10-CM | POA: Insufficient documentation

## 2023-11-18 DIAGNOSIS — Z79899 Other long term (current) drug therapy: Secondary | ICD-10-CM | POA: Insufficient documentation

## 2023-11-18 DIAGNOSIS — R10A1 Flank pain, right side: Secondary | ICD-10-CM

## 2023-11-18 DIAGNOSIS — W01198A Fall on same level from slipping, tripping and stumbling with subsequent striking against other object, initial encounter: Secondary | ICD-10-CM | POA: Insufficient documentation

## 2023-11-18 DIAGNOSIS — E66811 Obesity, class 1: Secondary | ICD-10-CM

## 2023-11-18 LAB — CBC WITH DIFFERENTIAL/PLATELET
Abs Immature Granulocytes: 0.06 K/uL (ref 0.00–0.07)
Basophils Absolute: 0.1 K/uL (ref 0.0–0.1)
Basophils Relative: 1 %
Eosinophils Absolute: 0.1 K/uL (ref 0.0–0.5)
Eosinophils Relative: 1 %
HCT: 38.1 % — ABNORMAL LOW (ref 39.0–52.0)
Hemoglobin: 12.7 g/dL — ABNORMAL LOW (ref 13.0–17.0)
Immature Granulocytes: 1 %
Lymphocytes Relative: 11 %
Lymphs Abs: 1.1 K/uL (ref 0.7–4.0)
MCH: 32.3 pg (ref 26.0–34.0)
MCHC: 33.3 g/dL (ref 30.0–36.0)
MCV: 96.9 fL (ref 80.0–100.0)
Monocytes Absolute: 0.8 K/uL (ref 0.1–1.0)
Monocytes Relative: 9 %
Neutro Abs: 7.3 K/uL (ref 1.7–7.7)
Neutrophils Relative %: 77 %
Platelets: 222 K/uL (ref 150–400)
RBC: 3.93 MIL/uL — ABNORMAL LOW (ref 4.22–5.81)
RDW: 13.6 % (ref 11.5–15.5)
WBC: 9.4 K/uL (ref 4.0–10.5)
nRBC: 0 % (ref 0.0–0.2)

## 2023-11-18 LAB — COMPREHENSIVE METABOLIC PANEL WITH GFR
ALT: 20 U/L (ref 0–44)
AST: 26 U/L (ref 15–41)
Albumin: 4.2 g/dL (ref 3.5–5.0)
Alkaline Phosphatase: 52 U/L (ref 38–126)
Anion gap: 9 (ref 5–15)
BUN: 22 mg/dL (ref 8–23)
CO2: 25 mmol/L (ref 22–32)
Calcium: 9.4 mg/dL (ref 8.9–10.3)
Chloride: 102 mmol/L (ref 98–111)
Creatinine, Ser: 1.2 mg/dL (ref 0.61–1.24)
GFR, Estimated: >60 mL/min (ref >60)
Glucose, Bld: 110 mg/dL — ABNORMAL HIGH (ref 70–99)
Potassium: 5 mmol/L (ref 3.5–5.1)
Sodium: 136 mmol/L (ref 135–145)
Total Bilirubin: 0.3 mg/dL (ref 0.0–1.2)
Total Protein: 6.4 g/dL — ABNORMAL LOW (ref 6.5–8.1)

## 2023-11-18 MED ORDER — METHOCARBAMOL 500 MG PO TABS: 500.0000 mg | ORAL_TABLET | Freq: Two times a day (BID) | ORAL | 0 refills | Status: AC

## 2023-11-18 MED ORDER — LIDOCAINE 5 % EX PTCH
2.0000 | MEDICATED_PATCH | CUTANEOUS | Status: DC
  Administered 2023-11-18: 2 via TRANSDERMAL
  Filled 2023-11-18: qty 2

## 2023-11-18 MED ORDER — OXYCODONE HCL 5 MG PO TABS
5.0000 mg | ORAL_TABLET | Freq: Once | ORAL | Status: AC
  Administered 2023-11-18: 5 mg via ORAL
  Filled 2023-11-18: qty 1

## 2023-11-18 MED ORDER — IOHEXOL 300 MG/ML  SOLN
100.0000 mL | Freq: Once | INTRAMUSCULAR | Status: AC | PRN
  Administered 2023-11-18: 100 mL via INTRAVENOUS

## 2023-11-18 MED ORDER — OXYCODONE HCL 5 MG PO TABS: 5.0000 mg | ORAL_TABLET | ORAL | 0 refills | Status: DC | PRN

## 2023-11-18 MED ORDER — LIDOCAINE 5 % EX PTCH: 1.0000 | MEDICATED_PATCH | CUTANEOUS | 0 refills | Status: AC

## 2023-11-18 NOTE — ED Provider Notes (Signed)
 [place] [facility]PARTMENT AT [facility] Provider Note   CSN: [id number] Arrival date & time: 11/18/23  9145     Patient presents with: Allen [name] is a 73 y.o. male patient with past medical history of GERD, COPD and asthma, congestive heart failure, obstructive sleep apnea is presenting to emergency room with complaint of fall.  Patient reports that he was in the shower and lost his balance, he reports mechanical fall from ground height.  He fell onto the shower chair landing on right side of his chest and lower abdomen and back.  Since then he has had left lateral chest wall pain and left flank pain and bruising.  It is worse when he is taking a deep breath then or trying to move.  He has been taking oxycodone  Tylenol  ibuprofen at home.  He denies hitting his head, headache or neck pain. No BT.    Fall       Prior to Admission medications   Medication Sig Start Date End Date Taking? Authorizing Provider  lidocaine  (LIDODERM ) 5 % Place 1 patch onto the skin daily. Remove & Discard patch within 12 hours or as directed by MD 11/18/23  Yes Brooklynne Pereida N, PA-C  methocarbamol (ROBAXIN) 500 MG tablet Take 1 tablet (500 mg total) by mouth 2 (two) times daily. 11/18/23  Yes Camille Dragan, Warren SAILOR, PA-C  oxyCODONE  (ROXICODONE ) 5 MG immediate release tablet Take 1 tablet (5 mg total) by mouth every 4 (four) hours as needed for severe pain (pain score 7-10) or breakthrough pain. 11/18/23  Yes Malaney Mcbean N, PA-C  albuterol  (VENTOLIN  HFA) 108 (90 Base) MCG/ACT inhaler USE 1 TO 2 INHALATIONS BY MOUTH  EVERY 4 HOURS AS NEEDED FOR  WHEEZING OR SHORTNESS OF BREATH 08/26/23   Hope Almarie ORN, NP  Azelastine -Fluticasone  137-50 MCG/ACT SUSP Place 1 spray into the nose in the morning and at bedtime. 10/30/22   Theophilus Andrews, Tully GRADE, MD  busPIRone  (BUSPAR ) 30 MG tablet Take 30 mg by mouth at bedtime.    [provider]  clonazePAM (KLONOPIN) 0.5 MG tablet Take 0.5 mg  by mouth daily as needed.    [provider]  escitalopram  (LEXAPRO ) 10 MG tablet Take 10 mg by mouth daily. 06/16/22   [provider]  montelukast  (SINGULAIR ) 10 MG tablet Take 1 tablet (10 mg total) by mouth at bedtime. 01/28/23   Hope Almarie ORN, NP  mupirocin ointment (BACTROBAN) 2 % Apply 1 Application topically 3 (three) times daily as needed (irritation).    [provider]  primidone  (MYSOLINE ) 50 MG tablet Take 50 mg by mouth. 3 tablets in the morning and 2 in the evening    [provider]  rosuvastatin  (CRESTOR ) 5 MG tablet Take 1 tablet (5 mg total) by mouth daily. 10/08/23   Lonni Slain, MD  tadalafil  (CIALIS ) 20 MG tablet Take 1 tablet (20 mg total) by mouth daily. 11/06/23   Theophilus Andrews, Tully GRADE, MD  tirzepatide  5 MG/0.5ML injection vial Inject 5 mg into the skin once a week. 10/24/23   Theophilus Andrews, Tully GRADE, MD  traZODone  (DESYREL ) 100 MG tablet TAKE 1 TABLET AT BEDTIME 06/08/21   Theophilus Andrews, Tully GRADE, MD  TRELEGY ELLIPTA  200-62.5-25 MCG/ACT AEPB USE 1 INHALATION BY MOUTH DAILY 08/26/23   Hope Almarie ORN, NP  triamcinolone  (KENALOG ) 0.025 % cream Apply 1 Application topically 2 (two) times daily as needed (irritation).    [provider]  VITAMIN D  PO Take  1 capsule by mouth daily.    [provider]  zaleplon (SONATA) 10 MG capsule Take 10 mg by mouth at bedtime.    [provider]    Allergies: Copper-containing compounds    Review of Systems  Musculoskeletal:  Positive for back pain.    Updated Vital Signs BP 117/65   Pulse 69   Temp 98 F (36.7 C) (Oral)   Resp 16   SpO2 95%   Physical Exam Vitals and nursing note reviewed.  Constitutional:      General: He is not in acute distress.    Appearance: He is not toxic-appearing.  HENT:     Head: Normocephalic and atraumatic.  Eyes:     General: No scleral icterus.    Conjunctiva/sclera: Conjunctivae normal.  Neck:      Comments: No cervical, thoracic or lumbar midline tenderness step-off or deformity. Cardiovascular:     Rate and Rhythm: Normal rate and regular rhythm.     Pulses: Normal pulses.     Heart sounds: Normal heart sounds.  Pulmonary:     Effort: Pulmonary effort is normal. No respiratory distress.     Breath sounds: Normal breath sounds.  Abdominal:     General: Abdomen is flat. Bowel sounds are normal.     Palpations: Abdomen is soft.     Tenderness: There is no abdominal tenderness.  Musculoskeletal:       Arms:     Right lower leg: No edema.     Left lower leg: No edema.     Comments: Numbness to right lateral chest wall and right flank.  Ecchymosis to right flank.  Skin:    General: Skin is warm and dry.     Findings: No lesion.  Neurological:     General: No focal deficit present.     Mental Status: He is alert and oriented to person, place, and time. Mental status is at baseline.     (all labs ordered are listed, but only abnormal results are displayed) Labs Reviewed  CBC WITH DIFFERENTIAL/PLATELET - Abnormal; Notable for the following components:      Result Value   RBC 3.93 (*)    Hemoglobin 12.7 (*)    HCT 38.1 (*)    All other components within normal limits  COMPREHENSIVE METABOLIC PANEL WITH GFR - Abnormal; Notable for the following components:   Glucose, Bld 110 (*)    Total Protein 6.4 (*)    All other components within normal limits    EKG: None  Radiology: CT CHEST ABDOMEN PELVIS W CONTRAST Result Date: 11/18/2023 CLINICAL DATA:  Fall, flank pain and bruising EXAM: CT CHEST, ABDOMEN, AND PELVIS WITH CONTRAST TECHNIQUE: Multidetector CT imaging of the chest, abdomen and pelvis was performed following the standard protocol during bolus administration of intravenous contrast. RADIATION DOSE REDUCTION: This exam was performed according to the departmental dose-optimization program which includes automated exposure control, adjustment of the mA and/or kV  according to patient size and/or use of iterative reconstruction technique. CONTRAST:  100mL OMNIPAQUE IOHEXOL 300 MG/ML  SOLN COMPARISON:  None Available. FINDINGS: CT CHEST FINDINGS Cardiovascular: Heart size is normal.  No coronary calcifications Mediastinum/Nodes: No adenopathy or abnormal mass Lungs/Pleura: There is some atelectasis and a small pleural effusion on the right. Musculoskeletal: There are fractures of the right eighth done, ninth and tenth ribs. CT ABDOMEN PELVIS FINDINGS Hepatobiliary: No liver lesions. No intrahepatic biliary dilatation. Gallbladder: Normal Pancreas: Normal Spleen: Normal Adrenal glands: Normal. Kidneys: Normal Stomach/duodenum: Normal  Small & large bowel: Normal. Vascular/Lymphatic: No significant vascular findings are present. No enlarged abdominal or pelvic lymph nodes. Pelvis: No abnormal mass, adenopathy or inflammatory process. Other: None Musculoskeletal: No bone lesion, fracture or other significant abnormality IMPRESSION: Fractures of the right eighth, ninth and tenth ribs. There is an associated small pleural effusion with atelectasis. No pneumothorax. No other significant abnormality Electronically Signed   By: Nancyann Burns M.D.   On: 11/18/2023 12:50     Procedures   Medications Ordered in the ED  lidocaine  (LIDODERM ) 5 % 2 patch (2 patches Transdermal Patch Applied 11/18/23 1003)  oxyCODONE  (Oxy IR/ROXICODONE ) immediate release tablet 5 mg (5 mg Oral Given 11/18/23 0958)  iohexol (OMNIPAQUE) 300 MG/ML solution 100 mL (100 mLs Intravenous Contrast Given 11/18/23 1128)                                    Medical Decision Making Amount and/or Complexity of Data Reviewed Labs: ordered. Radiology: ordered.  Risk Prescription drug management.   This patient presents to the ED for concern of fall, this involves an extensive number of treatment options, and is a complaint that carries with it a high risk of complications and morbidity.  The differential  diagnosis includes intracranial hemorrhage, subdural/epidural hematoma, vertebral fracture, spinal cord injury, muscle strain, skull fracture, fracture, splenic injury, liver injury, perforated viscus, contusions.   Lab Tests:  I personally interpreted labs.  The pertinent results include:   Check CBC, CMP to evaluate for anemia or electrolyte abnormality.   Imaging Studies ordered:  I ordered imaging studies including CT chest abdomen pelvis with contrast I independently visualized and interpreted imaging which showed fractures of right 8th, 9th and 10th ribs with a small associated pleural effusion no pneumothorax.  No other abnormality. I agree with the radiologist interpretation   Cardiac Monitoring: / EKG:  The patient was maintained on a cardiac monitor.     Problem List / ED Course / Critical interventions / Medication management  Patient reporting to ER with complaint of fall.  Patient reports mechanical fall 4 days ago.  He fell onto a chair in his walk-in shower injuring right flank area.  This is primarily located to right lateral chest wall and right mid back.  He has noticed that tenderness to touch, pain and worse with movement.  When he fell he did not hit his head.  He has no head or neck pain.  He is not on blood thinner.  He is moving extremities without difficulty.  Hemodynamically stable and well-appearing.  Labs show hemoglobin of 12.7, CMP unremarkable.  Imaging does show fracture of the right lower ribs.  Patient was able to do incentive spirometry.  Pain has improved after treatment here.  Patient's pain is well-controlled, no pneumothorax, good respiratory effort and rate.  Considered admission for this patient however he prefers to go home with pain management. I ordered medication including Lidoderm  patch, oxycodone . Reevaluation of the patient after these medicines showed that the patient improved I have reviewed the patients home medicines and have made  adjustments as needed. Patient is hemodynamically stable.  Pain is well-controlled.  I did give incentive spirometry.  Patient will follow-up primary care.  Given return precautions.        Final diagnoses:  Closed fracture of multiple ribs of right side, initial encounter    ED Discharge Orders  Ordered    methocarbamol (ROBAXIN) 500 MG tablet  2 times daily        11/18/23 1334    oxyCODONE  (ROXICODONE ) 5 MG immediate release tablet  Every 4 hours PRN        11/18/23 1334    lidocaine  (LIDODERM ) 5 %  Every 24 hours        11/18/23 1334               Annamaria Salah, Warren SAILOR, PA-C 11/18/23 1429    Zackowski, Scott, MD 11/19/23 1234

## 2023-11-18 NOTE — Discharge Instructions (Signed)
 You can take 1000 mg of Tylenol  every 8 hours.  You can take 600 mg of ibuprofen every 8 hours but take with food. Take Robaxin as needed for muscle spasm.  Use ice or heat over area of pain or lidocaine .  Take oxycodone  for breakthrough pain. Return to emergency room with new or worsening symptoms.

## 2023-11-18 NOTE — ED Notes (Signed)
 Discharge instructions, follow up care, pain management and prescriptions reviewed and explained, pt verbalized understanding and had no further questions on d/c. Pt caox4, ambulatory NAD on d/c.

## 2023-11-18 NOTE — ED Triage Notes (Signed)
 Pt arrived POV caox4 ambulatory c/o pain and bruising in R flank/back from a fall in the shower on Friday night which consisted of him hitting his R side/back on shower chair. Pt denies hitting head denies LOC, does not take blood thinner. Reports pain and bruising has worsened, pain on respiration.

## 2023-11-20 ENCOUNTER — Encounter: Payer: Self-pay | Admitting: Internal Medicine

## 2023-11-21 ENCOUNTER — Other Ambulatory Visit: Payer: Self-pay | Admitting: Internal Medicine

## 2023-11-21 DIAGNOSIS — E66811 Obesity, class 1: Secondary | ICD-10-CM

## 2023-11-21 MED ORDER — TIRZEPATIDE-WEIGHT MANAGEMENT 7.5 MG/0.5ML ~~LOC~~ SOLN: 7.5000 mg | SUBCUTANEOUS | 0 refills | Status: DC

## 2023-12-11 ENCOUNTER — Other Ambulatory Visit: Payer: Self-pay | Admitting: Internal Medicine

## 2023-12-11 ENCOUNTER — Encounter: Payer: Self-pay | Admitting: Internal Medicine

## 2023-12-11 DIAGNOSIS — E6609 Other obesity due to excess calories: Secondary | ICD-10-CM

## 2023-12-16 ENCOUNTER — Ambulatory Visit: Admitting: Internal Medicine

## 2023-12-16 ENCOUNTER — Other Ambulatory Visit: Payer: Self-pay | Admitting: Internal Medicine

## 2023-12-16 DIAGNOSIS — E66811 Obesity, class 1: Secondary | ICD-10-CM

## 2023-12-16 MED ORDER — TIRZEPATIDE-WEIGHT MANAGEMENT 10 MG/0.5ML ~~LOC~~ SOLN: 10.0000 mg | SUBCUTANEOUS | 0 refills | Status: DC

## 2023-12-21 ENCOUNTER — Other Ambulatory Visit: Payer: Self-pay | Admitting: Primary Care

## 2023-12-27 ENCOUNTER — Telehealth: Payer: Self-pay | Admitting: Primary Care

## 2023-12-27 NOTE — Telephone Encounter (Signed)
 Fax received from Dr. Ann with [facility] to perform a robotic assisted incisional hernia repair under general anesthesia on patient.  Patient needs surgery clearance. Surgery is pending. Patient was seen on 04/22/23. Office protocol is a risk assessment can be sent to surgeon if patient has been seen in 60 days or less.   Pt needs appt for risk assessment. I called pt and there was no answer and no voicemail picked up. Will route to clearance pool and then try again later.

## 2023-12-30 ENCOUNTER — Other Ambulatory Visit: Payer: Self-pay

## 2023-12-30 DIAGNOSIS — E7800 Pure hypercholesterolemia, unspecified: Secondary | ICD-10-CM

## 2023-12-30 DIAGNOSIS — I700 Atherosclerosis of aorta: Secondary | ICD-10-CM

## 2023-12-31 ENCOUNTER — Encounter: Payer: Self-pay | Admitting: Primary Care

## 2023-12-31 ENCOUNTER — Telehealth: Payer: Self-pay | Admitting: *Deleted

## 2023-12-31 ENCOUNTER — Ambulatory Visit: Admitting: Primary Care

## 2023-12-31 VITALS — BP 126/64 | HR 74 | Temp 97.7°F | Ht 70.0 in | Wt 227.8 lb

## 2023-12-31 DIAGNOSIS — J4489 Other specified chronic obstructive pulmonary disease: Secondary | ICD-10-CM

## 2023-12-31 MED ORDER — AZITHROMYCIN 250 MG PO TABS: ORAL_TABLET | ORAL | 0 refills | Status: DC

## 2023-12-31 MED ORDER — PREDNISONE 10 MG PO TABS: ORAL_TABLET | ORAL | 0 refills | Status: AC

## 2023-12-31 NOTE — Progress Notes (Signed)
 @Patient  ID: Allen [name], male    DOB: 1950/02/04, 73 y.o.   MRN: [id number]  Chief Complaint  Patient presents with   Medical Management of Chronic Issues    Hernia- either at [facility] or [facility]. Not scheduled yet. Dr Donnel     Referring provider: Theophilus Andrews, Tully GRADE, MD  HPI: 73 year old male, former smoker. PMH significant for CHF, varicose veins, COPD with asthma, emphysema s/p deep brain stimulator placement, mild OSA, pulmonary nodules <38mm, UC, GERD, essential tremor, vit D deficiency. Former Dr. Shellia patient.   04/22/2023 Discussed the use of AI scribe software for clinical note transcription with the patient, who gave verbal consent to proceed.  History of Present Illness   Allen [name] is a 73 year old male with COPD and emphysema who presents for a surgical risk assessment for ACL repair.  He has a history of COPD, emphysema, and asthma. He uses Trelegy 200 micrograms, one puff daily, and a rescue inhaler for dyspnea, particularly on exertion such as climbing stairs or inclines. During a recent trip to [place], he experienced shortness of breath. No recent respiratory infections or bronchitis, though he had a cold in January treated with prednisone , which resolved his symptoms. His last CT scan in April 2024 showed stable emphysema with partially resolved pulmonary nodules.  He has a history of sleep apnea, previously severe but now mild following a 60-pound weight loss. He no longer requires CPAP or BiPAP therapy and reports no current sleep disturbances or symptoms of sleep apnea.  He has a deep brain nerve stimulator for tremors, which requires a battery replacement every four years. The current battery is dead, and he notes that the stimulator helps manage his tremor symptoms.  He is preparing for ACL repair surgery, which he has postponed to June due to an upcoming prostate biopsy scheduled for this Friday.         12/31/2023- Interim hx  Discussed  the use of AI scribe software for clinical note transcription with the patient, who gave verbal consent to proceed.  History of Present Illness Allen [name] is a 73 year old male with COPD and asthma who presents for preoperative pulmonary evaluation for an upcoming ventral hernia repair.  He is scheduled for a ventral hernia repair, with no exact date set yet. He has been cleared by other specialists. He has a history of multiple surgeries, including prostate cancer surgery in July and an ACL repair, with no prior complications from general anesthesia.  He has COPD and asthma, managed with Trelegy and montelukast . He experiences occasional breathing difficulties at night while sitting and watching TV, requiring the use of a nebulizer. He uses Trelegy in the morning and a rescue inhaler or nebulizer in the evening as needed. He requests refills for his nebulizer and a sample pack of medications for travel, including a Z-Pak and prednisone , which he has not used since last year.  He has recently started using tirzepatide  for weight loss and has reduced his weight from 241 to 227 pounds. He reports no issues with sleep, stating he 'sleeps like a baby,' and has received his flu, pneumonia, and COVID vaccinations.  In September, he fell in the shower and cracked three ribs, which he believes may have contributed to the worsening of his hernia. A recent CT scan of his chest and abdomen confirmed the rib fractures and showed some atelectasis and a small pleural effusion on the right, but no pneumothorax. No current respiratory  infections, shortness of breath, or cough.   Allergies[1]  Immunization History  Administered Date(s) Administered   Fluad Quad(high Dose 65+) 09/18/2018, 11/10/2019   Fluad Trivalent(High Dose 65+) 10/22/2022   INFLUENZA, HIGH DOSE SEASONAL PF 11/01/2020   Influenza-Unspecified 10/06/2021, 10/07/2023   Moderna Covid-19 Fall Seasonal Vaccine 50yrs & older 10/22/2022   PFIZER  Comirnaty(Gray Top)Covid-19 Tri-Sucrose Vaccine 10/06/2021   PFIZER(Purple Top)SARS-COV-2 Vaccination 02/20/2019, 03/17/2019, 06/02/2019, 10/03/2019, 11/01/2020   PNEUMOCOCCAL CONJUGATE-20 11/06/2023   Pfizer(Comirnaty)Fall Seasonal Vaccine 12 years and older 10/07/2023   Pneumococcal Polysaccharide-23 01/08/2015   Pneumococcal-Unspecified 12/15/2017   Respiratory Syncytial Virus Vaccine,Recomb Aduvanted(Arexvy) 10/27/2021   Tdap 09/17/2016, 04/04/2021   Zoster Recombinant(Shingrix) 06/06/2019, 09/05/2019    Past Medical History:  Diagnosis Date   Anxiety    Arthritis    neck -   BPH with obstruction/lower urinary tract symptoms    urologist-- dr pace   Cancer [facility])    basal cell to nose   Centrilobular emphysema (HCC)    followed by dr shellia   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(HCC)    followed by dr shellia   Cough 07/2020   covid residual, no congestion   Depression    DOE (dyspnea on exertion)    due to copd/ emphysema   ED (erectile dysfunction)    Essential tremor    neurologist--- dr tat----  bilateral upper extremities,  s/p DPS 12/ 2016   GERD (gastroesophageal reflux disease)    History of 2019 novel coronavirus disease (COVID-19) 07/18/2020   per pt postive home test 07-18-2020 w/ mild  symptoms per pt; pt stated had urgent care visit 07-24-2020 for sob/ cough with negative cxr (in care everywhere) ;  follow up pcp visit 08-03-2020 in epic   Insomnia    Mixed simple and mucopurulent chronic bronchitis (HCC)    OSA (obstructive sleep apnea)    followed by dr shellia---- no longer does uses bipap, intolerant --- pt prescriped  Oxygen   2 L oxygen  via [place] while sleeping  (study in epic 04/ 2017 AHI 40)   Perennial allergic rhinitis    Pneumonia    Prostate cancer (HCC)    Pulmonary nodules    followed by dr shellia   S/P deep brain stimulator placement 12/2014   followed by neurology-----IPG change 01-21-2020 for end-of life battery  (pt has a control)   Varicose vein of leg    per pt has  had 3 procedure's last one 2006   Wears dentures    upper    Tobacco History: Tobacco Use History[2] Counseling given: Not Answered   Outpatient Medications Prior to Visit  Medication Sig Dispense Refill   albuterol  (VENTOLIN  HFA) 108 (90 Base) MCG/ACT inhaler USE 1 TO 2 INHALATIONS BY MOUTH  EVERY 4 HOURS AS NEEDED FOR  WHEEZING OR SHORTNESS OF BREATH 108 g 2   Azelastine -Fluticasone  137-50 MCG/ACT SUSP Place 1 spray into the nose in the morning and at bedtime. 23 g 5   busPIRone  (BUSPAR ) 30 MG tablet Take 30 mg by mouth at bedtime.     clonazePAM (KLONOPIN) 0.5 MG tablet Take 0.5 mg by mouth daily as needed.     escitalopram  (LEXAPRO ) 10 MG tablet Take 10 mg by mouth daily.     lidocaine  (LIDODERM ) 5 % Place 1 patch onto the skin daily. Remove & Discard patch within 12 hours or as directed by MD 30 patch 0   methocarbamol  (ROBAXIN ) 500 MG tablet Take 1 tablet (500 mg total) by mouth 2 (two) times  daily. 20 tablet 0   montelukast  (SINGULAIR ) 10 MG tablet TAKE 1 TABLET BY MOUTH DAILY AT BEDTIME 100 tablet 1   mupirocin ointment (BACTROBAN) 2 % Apply 1 Application topically 3 (three) times daily as needed (irritation).     oxyCODONE  (ROXICODONE ) 5 MG immediate release tablet Take 1 tablet (5 mg total) by mouth every 4 (four) hours as needed for severe pain (pain score 7-10) or breakthrough pain. 15 tablet 0   primidone  (MYSOLINE ) 50 MG tablet Take 50 mg by mouth. 3 tablets in the morning and 2 in the evening     rosuvastatin  (CRESTOR ) 5 MG tablet Take 1 tablet (5 mg total) by mouth daily. 90 tablet 3   tadalafil  (CIALIS ) 20 MG tablet Take 1 tablet (20 mg total) by mouth daily. 90 tablet 3   tirzepatide  10 MG/0.5ML injection vial Inject 10 mg into the skin once a week. 2 mL 0   traZODone  (DESYREL ) 100 MG tablet TAKE 1 TABLET AT BEDTIME 90 tablet 1   TRELEGY ELLIPTA  200-62.5-25 MCG/ACT AEPB USE 1 INHALATION BY MOUTH DAILY 180 each 3   triamcinolone  (KENALOG ) 0.025 % cream Apply 1 Application  topically 2 (two) times daily as needed (irritation).     VITAMIN D  PO Take 1 capsule by mouth daily.     zaleplon (SONATA) 10 MG capsule Take 10 mg by mouth at bedtime.     No facility-administered medications prior to visit.    Review of Systems  Review of Systems  Constitutional: Negative.   Respiratory: Negative.    Cardiovascular: Negative.    Physical Exam  BP 126/64   Pulse 74   Temp 97.7 F (36.5 C)   Ht 5' 10 (1.778 m) Comment: pt stated  Wt 227 lb 12.8 oz (103.3 kg)   SpO2 96% Comment: ra  BMI 32.69 kg/m  Physical Exam Constitutional:      Appearance: Normal appearance.  Cardiovascular:     Rate and Rhythm: Normal rate and regular rhythm.  Pulmonary:     Effort: Pulmonary effort is normal.     Breath sounds: Normal breath sounds.     Comments: CTA Abdominal:     Comments: Ventral hernia   Musculoskeletal:        General: Normal range of motion.  Skin:    General: Skin is warm and dry.  Neurological:     General: No focal deficit present.     Mental Status: He is alert and oriented to person, place, and time. Mental status is at baseline.  Psychiatric:        Mood and Affect: Mood normal.        Behavior: Behavior normal.        Thought Content: Thought content normal.        Judgment: Judgment normal.      Lab Results:  CBC    Component Value Date/Time   WBC 9.4 11/18/2023 0957   RBC 3.93 (L) 11/18/2023 0957   HGB 12.7 (L) 11/18/2023 0957   HCT 38.1 (L) 11/18/2023 0957   PLT 222 11/18/2023 0957   MCV 96.9 11/18/2023 0957   MCH 32.3 11/18/2023 0957   MCHC 33.3 11/18/2023 0957   RDW 13.6 11/18/2023 0957   LYMPHSABS 1.1 11/18/2023 0957   MONOABS 0.8 11/18/2023 0957   EOSABS 0.1 11/18/2023 0957   BASOSABS 0.1 11/18/2023 0957    BMET    Component Value Date/Time   NA 136 11/18/2023 0957   K 5.0 11/18/2023 0957  CL 102 11/18/2023 0957   CO2 25 11/18/2023 0957   GLUCOSE 110 (H) 11/18/2023 0957   BUN 22 11/18/2023 0957   CREATININE  1.20 11/18/2023 0957   CALCIUM  9.4 11/18/2023 0957   GFRNONAA >60 11/18/2023 0957    BNP No results found for: BNP  ProBNP    Component Value Date/Time   PROBNP 33.0 04/10/2019 0941    Imaging: No results found.   Assessment & Plan:   Assessment & Plan Preoperative pulmonary risk assessment for ventral hernia repair Low to intermediate risk for prolonged mechanical ventilation and postoperative complications due to age, underlying asthma/COPD, and surgery duration (3.5 to 4 hours). No recent respiratory infections or hospitalizations. Respiratory exam normal, Lungs clear on examination. Cardiologist cleared for surgery. From pulmonary standpoint patient is optimized for surgery, ultimate clearance to be decided upon by surgeon.  - Continue Trelegy daily, including on the day of surgery. - Hold tirzepatide  one week prior to surgery. - Advised ambulation postoperatively to prevent complications. - Use incentive spirometer postoperatively to aid lung function. - Final clearance to be decided by anesthesiologist and surgeon.  Major Pulmonary risks identified in the multifactorial risk analysis are but not limited to a) pneumonia; b) recurrent intubation risk; c) prolonged or recurrent acute respiratory failure needing mechanical ventilation; d) prolonged hospitalization; e) DVT/Pulmonary embolism; f) Acute Pulmonary edema  Recommend 1. Short duration of surgery as much as possible and avoid paralytic if possible 2. Recovery in step down or ICU with Pulmonary consultation if needed  3. DVT prophylaxis 4. Aggressive pulmonary toilet with o2, bronchodilatation, and incentive spirometry and early ambulation   Chronic obstructive pulmonary disease and asthma COPD and asthma managed with Trelegy and montelukast . No recent respiratory infections. Occasional nocturnal breathing difficulties requiring nebulizer use. No daytime symptoms. No active bronchitis symptoms. Low risk for  respiratory complications due to vaccinations and stable condition. - Refilled prescriptions for Trelegy, montelukast , and nebulizer. - Provided sample pack of Z-Pak and prednisone  for travel use. - Provided sample of Trelegy if available. - Follow up in six months or sooner if needed.  Hx sleep apnea -Resolved   Almarie LELON Ferrari, NP 12/31/2023     [1]  Allergies Allergen Reactions   Copper-Containing Compounds     Copper gloves caused redness and blisters bilateral hands  [2]  Social History Tobacco Use  Smoking Status Former   Current packs/day: 0.00   Average packs/day: 3.0 packs/day for 20.0 years (60.0 ttl pk-yrs)   Types: Cigarettes   Start date: 80   Quit date: 21   Years since quitting: 34.9   Passive exposure: Never  Smokeless Tobacco Never

## 2023-12-31 NOTE — Patient Instructions (Addendum)
°  VISIT SUMMARY: Allen [name], a 73 year old male with COPD and asthma, visited for a preoperative pulmonary evaluation for an upcoming ventral hernia repair. He has a history of multiple surgeries without complications from anesthesia. His COPD and asthma are managed with Trelegy and montelukast , and he occasionally uses a nebulizer for breathing difficulties. He has recently lost weight using tirzepatide  and has no current respiratory infections or symptoms. He fell in September, cracking three ribs, which may have worsened his hernia. A recent CT scan confirmed the rib fractures and showed some atelectasis and a small pleural effusion.  YOUR PLAN: -PREOPERATIVE PULMONARY RISK ASSESSMENT FOR VENTRAL HERNIA REPAIR: You are at a low to intermediate risk for prolonged mechanical ventilation and postoperative complications due to your age, underlying asthma/COPD, and the duration of the surgery. Continue using Trelegy daily, including on the day of surgery. Stop taking tirzepatide  one week before surgery. After surgery, it is important to walk around to prevent complications and use an incentive spirometer to help your lung function. The final clearance for surgery will be decided by the anesthesiologist and surgeon.  -CHRONIC OBSTRUCTIVE PULMONARY DISEASE AND ASTHMA: Your COPD and asthma are being managed with Trelegy and montelukast . You have no recent respiratory infections and only occasional breathing difficulties at night. Your condition is stable, and you are at low risk for respiratory complications. Your prescriptions for Trelegy, montelukast , and nebulizer have been refilled. You have also been provided with a sample pack of Z-Pak and prednisone  for travel use, and a sample of Trelegy if available. Follow up in six months or sooner if needed.  INSTRUCTIONS: Follow up in six months or sooner if needed. Continue using Trelegy daily, including on the day of surgery. Stop taking tirzepatide  one week  before surgery. After surgery, walk around to prevent complications and use an incentive spirometer to help your lung function. The final clearance for surgery will be decided by the anesthesiologist and surgeon.  Orders: Trelegy 200mcg one puff   Follow-up: 6 months, ok to cancel apt in January

## 2023-12-31 NOTE — Telephone Encounter (Signed)
 Patient was seen in clinic today.  Copied from CRM #[id number]. Topic: General - Other >> Dec 30, 2023  2:45 PM Russell PARAS wrote: Reason for CRM:   Pt is contacting clinic requesting pulmonary clearance for surgery to treat a hernia. He has been advised by the surgeon that they will need this to move forward.  Per previous notes on 12/12, request for clearance was to be sent to clearance pool.  Pt requested call back for status update after clearance sent  CB#  [phone]

## 2024-01-01 MED ORDER — ROSUVASTATIN CALCIUM 5 MG PO TABS: 5.0000 mg | ORAL_TABLET | Freq: Every day | ORAL | 2 refills | Status: DC

## 2024-01-02 MED ORDER — ALBUTEROL SULFATE (2.5 MG/3ML) 0.083% IN NEBU: 2.5000 mg | INHALATION_SOLUTION | Freq: Four times a day (QID) | RESPIRATORY_TRACT | 1 refills | Status: AC | PRN

## 2024-01-02 NOTE — Telephone Encounter (Signed)
 [name]'s note from 12/31/23 was faxed to [facility].

## 2024-01-02 NOTE — Addendum Note (Signed)
 Addended by: HOPE ALMARIE ORN on: 01/02/2024 11:20 AM   Modules accepted: Orders

## 2024-01-02 NOTE — Telephone Encounter (Signed)
 Sent!

## 2024-01-02 NOTE — Telephone Encounter (Signed)
 I spoke with the pt and let him know that I took care of faxing his clearance note   He is asking about rx for neb solution States this was supposed to be sent at [facility] and it was never done  I can not tell what med he is referring to since it's not on his list  He has the neb machine, but needs solution  [name]- please send to his pharmacy on file, thanks!

## 2024-01-06 ENCOUNTER — Encounter: Payer: Self-pay | Admitting: Internal Medicine

## 2024-01-06 ENCOUNTER — Other Ambulatory Visit: Payer: Self-pay | Admitting: Internal Medicine

## 2024-01-06 DIAGNOSIS — E66811 Obesity, class 1: Secondary | ICD-10-CM

## 2024-01-06 MED ORDER — TIRZEPATIDE-WEIGHT MANAGEMENT 12.5 MG/0.5ML ~~LOC~~ SOAJ: 12.5000 mg | SUBCUTANEOUS | 0 refills | Status: DC

## 2024-01-06 MED ORDER — ZEPBOUND 12.5 MG/0.5ML ~~LOC~~ SOLN: 12.0000 mg | SUBCUTANEOUS | 0 refills | Status: DC

## 2024-01-07 MED ORDER — ZEPBOUND 12.5 MG/0.5ML ~~LOC~~ SOLN: 12.0000 mg | SUBCUTANEOUS | 0 refills | Status: DC

## 2024-01-14 ENCOUNTER — Emergency Department (HOSPITAL_BASED_OUTPATIENT_CLINIC_OR_DEPARTMENT_OTHER)
Admission: EM | Admit: 2024-01-14 | Discharge: 2024-01-14 | Disposition: A | Discharge status: Home / Self Care | Attending: Emergency Medicine | Admitting: Emergency Medicine

## 2024-01-14 ENCOUNTER — Other Ambulatory Visit: Payer: Self-pay

## 2024-01-14 ENCOUNTER — Emergency Department (HOSPITAL_BASED_OUTPATIENT_CLINIC_OR_DEPARTMENT_OTHER)

## 2024-01-14 ENCOUNTER — Encounter (HOSPITAL_BASED_OUTPATIENT_CLINIC_OR_DEPARTMENT_OTHER): Payer: Self-pay

## 2024-01-14 DIAGNOSIS — R109 Unspecified abdominal pain: Secondary | ICD-10-CM | POA: Diagnosis present

## 2024-01-14 DIAGNOSIS — K439 Ventral hernia without obstruction or gangrene: Secondary | ICD-10-CM | POA: Insufficient documentation

## 2024-01-14 LAB — COMPREHENSIVE METABOLIC PANEL WITH GFR
ALT: 22 U/L (ref 0–44)
AST: 24 U/L (ref 15–41)
Albumin: 4.7 g/dL (ref 3.5–5.0)
Alkaline Phosphatase: 61 U/L (ref 38–126)
Anion gap: 10 (ref 5–15)
BUN: 15 mg/dL (ref 8–23)
CO2: 26 mmol/L (ref 22–32)
Calcium: 9.7 mg/dL (ref 8.9–10.3)
Chloride: 103 mmol/L (ref 98–111)
Creatinine, Ser: 0.94 mg/dL (ref 0.61–1.24)
GFR, Estimated: >60 mL/min
Glucose, Bld: 124 mg/dL — ABNORMAL HIGH (ref 70–99)
Potassium: 4 mmol/L (ref 3.5–5.1)
Sodium: 140 mmol/L (ref 135–145)
Total Bilirubin: 0.4 mg/dL (ref 0.0–1.2)
Total Protein: 7 g/dL (ref 6.5–8.1)

## 2024-01-14 LAB — CBC
HCT: 40.2 % (ref 39.0–52.0)
Hemoglobin: 13.4 g/dL (ref 13.0–17.0)
MCH: 31.7 pg (ref 26.0–34.0)
MCHC: 33.3 g/dL (ref 30.0–36.0)
MCV: 95 fL (ref 80.0–100.0)
Platelets: 341 K/uL (ref 150–400)
RBC: 4.23 MIL/uL (ref 4.22–5.81)
RDW: 14.6 % (ref 11.5–15.5)
WBC: 6.3 K/uL (ref 4.0–10.5)
nRBC: 0 % (ref 0.0–0.2)

## 2024-01-14 LAB — URINALYSIS, ROUTINE W REFLEX MICROSCOPIC
Bilirubin Urine: NEGATIVE
Glucose, UA: NEGATIVE mg/dL
Hgb urine dipstick: NEGATIVE
Ketones, ur: NEGATIVE mg/dL
Leukocytes,Ua: NEGATIVE
Nitrite: NEGATIVE
Protein, ur: NEGATIVE mg/dL
Specific Gravity, Urine: >1.03 — ABNORMAL HIGH (ref 1.005–1.030)
pH: 5.5 (ref 5.0–8.0)

## 2024-01-14 LAB — LIPASE, BLOOD: Lipase: 28 U/L (ref 11–51)

## 2024-01-14 MED ORDER — IOHEXOL 300 MG/ML  SOLN
100.0000 mL | Freq: Once | INTRAMUSCULAR | Status: AC | PRN
  Administered 2024-01-14: 100 mL via INTRAVENOUS

## 2024-01-14 NOTE — Discharge Instructions (Signed)
 Please read and follow all provided instructions.  Your diagnoses today include:  1. Ventral hernia without obstruction or gangrene     Tests performed today include: Complete blood cell count: No concerning findings Complete metabolic panel: No concerning findings Lipase (pancreas function test): Was normal Urinalysis (urine test): No signs of infection CT scan of your abdomen and pelvis shows your hernia, now contains a loop of bowel, but no evidence of bowel obstruction Vital signs. See below for your results today.   Medications prescribed:  None  Take any prescribed medications only as directed.  Home care instructions:  Follow any educational materials contained in this packet. Consider trying an abdominal binder to help with pressure on the hernia area from the outside to see if this helps to be more comfortable  Follow-up instructions: Please follow-up with your general surgeon as planned  Return instructions:  SEEK IMMEDIATE MEDICAL ATTENTION IF: The pain does not go away or becomes severe  A temperature above 101F develops  Repeated vomiting occurs (multiple episodes)  Blood is being passed in stools or vomit (bright red or black tarry stools)  You have significantly worsening abdominal swelling If you have any other emergent concerns regarding your health  Additional Information: Abdominal (belly) pain can be caused by many things. Your caregiver performed an examination and possibly ordered blood/urine tests and imaging (CT scan, x-rays, ultrasound). Many cases can be observed and treated at home after initial evaluation in the emergency department. Even though you are being discharged home, abdominal pain can be unpredictable. Therefore, you need a repeated exam if your pain does not resolve, returns, or worsens. Most patients with abdominal pain don't have to be admitted to the hospital or have surgery, but serious problems like appendicitis and gallbladder attacks  can start out as nonspecific pain. Many abdominal conditions cannot be diagnosed in one visit, so follow-up evaluations are very important.  Your vital signs today were: BP 126/85 (BP Location: Right Arm)   Pulse 68   Temp 97.8 F (36.6 C) (Oral)   Resp 17   Ht 5' 10 (1.778 m)   Wt 99.8 kg   SpO2 96%   BMI 31.57 kg/m  If your blood pressure (bp) was elevated above 135/85 this visit, please have this repeated by your doctor within one month. --------------

## 2024-01-14 NOTE — ED Triage Notes (Signed)
 Presents to ED with c/o worsening abdominal hernia for two weeks. Scheduled for surgery in late February. States now painful, difficult bowel movements. Denies N/V. Sent by PCP

## 2024-01-14 NOTE — ED Provider Notes (Signed)
 " [place] EMERGENCY DEPARTMENT AT [facility] Provider Note   CSN: [id number] Arrival date & time: 01/14/24  9046     Patient presents with: Hernia and Abdominal Pain   Allen [name] is a 73 y.o. male.   Patient with history of prostate surgery, abdominal wall hernia that has become more painful and enlarged over the past month.  Review of imaging from an encounter in November showed fat-containing hernia at that time.  Patient states that the area has enlarged since the beginning of December.  He reports increased constipation, needing to take a laxative to have a daily bowel movement.  No bleeding.  No vomiting.  Patient has seen [facility] and is scheduled for elective repair of hernia in February (03/13/24).        Prior to Admission medications  Medication Sig Start Date End Date Taking? Authorizing Provider  albuterol  (PROVENTIL ) (2.5 MG/3ML) 0.083% nebulizer solution Take 3 mLs (2.5 mg total) by nebulization every 6 (six) hours as needed for wheezing or shortness of breath. 01/02/24   Hope Almarie ORN, NP  albuterol  (VENTOLIN  HFA) 108 (90 Base) MCG/ACT inhaler USE 1 TO 2 INHALATIONS BY MOUTH  EVERY 4 HOURS AS NEEDED FOR  WHEEZING OR SHORTNESS OF BREATH 08/26/23   Hope Almarie ORN, NP  Azelastine -Fluticasone  137-50 MCG/ACT SUSP Place 1 spray into the nose in the morning and at bedtime. 10/30/22   Theophilus Andrews, Tully GRADE, MD  azithromycin  (ZITHROMAX ) 250 MG tablet Per zpack instructions 12/31/23   Hope Almarie ORN, NP  busPIRone  (BUSPAR ) 30 MG tablet Take 30 mg by mouth at bedtime.    [provider]  clonazePAM (KLONOPIN) 0.5 MG tablet Take 0.5 mg by mouth daily as needed.    [provider]  escitalopram  (LEXAPRO ) 10 MG tablet Take 10 mg by mouth daily. 06/16/22   [provider]  lidocaine  (LIDODERM ) 5 % Place 1 patch onto the skin daily. Remove & Discard patch within 12 hours or as directed by MD 11/18/23   Barrett, Warren SAILOR, PA-C  methocarbamol  (ROBAXIN ) 500 MG tablet Take 1 tablet (500 mg total) by mouth 2 (two) times daily. 11/18/23   Barrett, Jamie N, PA-C  montelukast  (SINGULAIR ) 10 MG tablet TAKE 1 TABLET BY MOUTH DAILY AT BEDTIME 12/24/23   Hope Almarie ORN, NP  mupirocin ointment (BACTROBAN) 2 % Apply 1 Application topically 3 (three) times daily as needed (irritation).    [provider]  oxyCODONE  (ROXICODONE ) 5 MG immediate release tablet Take 1 tablet (5 mg total) by mouth every 4 (four) hours as needed for severe pain (pain score 7-10) or breakthrough pain. 11/18/23   Barrett, Jamie N, PA-C  primidone  (MYSOLINE ) 50 MG tablet Take 50 mg by mouth. 3 tablets in the morning and 2 in the evening    [provider]  rosuvastatin  (CRESTOR ) 5 MG tablet Take 1 tablet (5 mg total) by mouth daily. 01/01/24   Lonni Slain, MD  tadalafil  (CIALIS ) 20 MG tablet Take 1 tablet (20 mg total) by mouth daily. 11/06/23   Theophilus Andrews, Tully GRADE, MD  tirzepatide  (ZEPBOUND ) 12.5 MG/0.5ML Pen Inject 12.5 mg into the skin once a week. 01/06/24   Theophilus Andrews, Tully GRADE, MD  Tirzepatide -Weight Management (ZEPBOUND ) 12.5 MG/0.5ML SOLN Inject 12 mg into the skin once a week. 01/07/24   Theophilus Andrews, Tully GRADE, MD  traZODone  (DESYREL ) 100 MG tablet TAKE 1 TABLET AT BEDTIME 06/08/21   Theophilus Andrews, Tully GRADE, MD  TRELEGY ELLIPTA   200-62.5-25 MCG/ACT AEPB USE 1 INHALATION BY MOUTH DAILY 08/26/23   Hope Almarie ORN, NP  triamcinolone  (KENALOG ) 0.025 % cream Apply 1 Application topically 2 (two) times daily as needed (irritation).    [provider]  VITAMIN D  PO Take 1 capsule by mouth daily.    [provider]  zaleplon (SONATA) 10 MG capsule Take 10 mg by mouth at bedtime.    [provider]    Allergies: Copper-containing compounds    Review of Systems  Updated Vital Signs BP (!) 153/85 (BP Location: Right Arm)   Pulse 88   Temp 97.6 F (36.4 C) (Oral)   Resp  17   Ht 5' 10 (1.778 m)   Wt 99.8 kg   SpO2 99%   BMI 31.57 kg/m   Physical Exam Vitals and nursing note reviewed.  Constitutional:      General: He is not in acute distress.    Appearance: He is well-developed.  HENT:     Head: Normocephalic and atraumatic.  Eyes:     General:        Right eye: No discharge.        Left eye: No discharge.     Conjunctiva/sclera: Conjunctivae normal.  Cardiovascular:     Rate and Rhythm: Normal rate and regular rhythm.     Heart sounds: Normal heart sounds.  Pulmonary:     Effort: Pulmonary effort is normal.     Breath sounds: Normal breath sounds.  Abdominal:     Palpations: Abdomen is soft.     Tenderness: There is no abdominal tenderness.     Hernia: A hernia is present.     Comments: Patient with incisional hernia of the abdominal wall, when I lie him back, seems to be reducible.  No overlying erythema.  Patient does have some localized tenderness.  Musculoskeletal:     Cervical back: Normal range of motion and neck supple.  Skin:    General: Skin is warm and dry.  Neurological:     Mental Status: He is alert.     (all labs ordered are listed, but only abnormal results are displayed) Labs Reviewed  COMPREHENSIVE METABOLIC PANEL WITH GFR - Abnormal; Notable for the following components:      Result Value   Glucose, Bld 124 (*)    All other components within normal limits  URINALYSIS, ROUTINE W REFLEX MICROSCOPIC - Abnormal; Notable for the following components:   Specific Gravity, Urine >1.030 (*)    All other components within normal limits  LIPASE, BLOOD  CBC    EKG: None  Radiology: CT ABDOMEN PELVIS W CONTRAST Result Date: 01/14/2024 CLINICAL DATA:  Abdominal wall hernia EXAM: CT ABDOMEN AND PELVIS WITH CONTRAST TECHNIQUE: Multidetector CT imaging of the abdomen and pelvis was performed using the standard protocol following bolus administration of intravenous contrast. RADIATION DOSE REDUCTION: This exam was performed  according to the departmental dose-optimization program which includes automated exposure control, adjustment of the mA and/or kV according to patient size and/or use of iterative reconstruction technique. CONTRAST:  OMNIPAQUE  IOHEXOL  300 MG/ML  SOLN COMPARISON:  November 18, 2023 FINDINGS: Lower chest: Minimal right pleural effusion is noted. Hepatobiliary: No focal liver abnormality is seen. No gallstones, gallbladder wall thickening, or biliary dilatation. Pancreas: Unremarkable. No pancreatic ductal dilatation or surrounding inflammatory changes. Spleen: Normal in size without focal abnormality. Adrenals/Urinary Tract: Adrenal glands are unremarkable. Kidneys are normal, without renal calculi, focal lesion, or hydronephrosis. Bladder is unremarkable. Stomach/Bowel: Stomach is  unremarkable. Status post appendectomy. No evidence of bowel obstruction or inflammation. Vascular/Lymphatic: Aortic atherosclerosis. No enlarged abdominal or pelvic lymph nodes. Reproductive: Status post prostatectomy. Other: Moderate size periumbilical hernia is noted which contains a loop of small bowel, but does not result in obstruction. No ascites is noted. Musculoskeletal: No acute or significant osseous findings. IMPRESSION: 1. Moderate size periumbilical hernia is noted which contains a loop of small bowel, but does not result in obstruction. 2. Minimal right pleural effusion. 3. Aortic atherosclerosis. Aortic Atherosclerosis (ICD10-I70.0). Electronically Signed   By: Lynwood Landy Raddle M.D.   On: 01/14/2024 14:16     Procedures   Medications Ordered in the ED - No data to display  ED Course  Patient seen and examined. History obtained directly from patient and family member at bedside.  I reviewed recent imaging from an encounter unrelated to the abdominal hernia.  Labs/EKG: Personally reviewed and interpreted lab workup ordered in triage this includes a CBC with normal white blood cell count and hemoglobin; CMP with  glucose 124 otherwise unremarkable; normal lipase; UA concentrated but no signs of infection.  Imaging: Ordered CT abdomen and pelvis with contrast to reevaluate incisional hernia.  Medications/Fluids: None ordered  Most recent vital signs reviewed and are as follows: BP (!) 153/85 (BP Location: Right Arm)   Pulse 88   Temp 97.6 F (36.4 C) (Oral)   Resp 17   Ht 5' 10 (1.778 m)   Wt 99.8 kg   SpO2 99%   BMI 31.57 kg/m   Initial impression: Ventral hernia, appears to be reducible, patient with enlargement and increasing pain.  For this reason we will reassess with imaging.  No obvious obstruction symptoms on exam.  2:41 PM Reassessment performed. Patient appears stable, comfortable.  Frustrated that his surgery is not for another 2 months.  Patient discussed with and seen by Dr. Freddi.  Agrees no evidence of incarceration.  Imaging personally visualized and interpreted including: CT of the abdomen and pelvis shows a small loop of bowel now contained within the hernia, but no signs of obstruction or incarceration which is clinically consistent with exam.  Reviewed pertinent lab work and imaging with patient at bedside. Questions answered.   Most current vital signs reviewed and are as follows: BP 126/85 (BP Location: Right Arm)   Pulse 68   Temp 97.8 F (36.6 C) (Oral)   Resp 17   Ht 5' 10 (1.778 m)   Wt 99.8 kg   SpO2 96%   BMI 31.57 kg/m   Plan: Discharge to home.   Prescriptions written for: None, offered pain medication but patient would like to continue over-the-counter medications  Other home care instructions discussed: Discussed bowel regimen, discussed possible trial of an abdominal binder.  ED return instructions discussed: The patient was urged to return to the Emergency Department immediately with worsening of current symptoms, worsening abdominal pain, persistent vomiting, blood noted in stools, fever, or any other concerns. The patient verbalized  understanding.   Follow-up instructions discussed: Patient encouraged to follow-up with their surgeon as planned.                                    Medical Decision Making Amount and/or Complexity of Data Reviewed Labs: ordered. Radiology: ordered.  Risk Prescription drug management.   Patient with enlarging and continued pain from ventral/incisional hernia.  Patient was reevaluated today with CT imaging which now shows  a small loop of bowel, without evidence of obstruction or strangulation.  This is consistent with exam.  Lab workup is reassuring.  No indication for emergent surgical evaluation today.  Will continue to try to control symptoms.  Patient will reach out to his surgeon as needed.  The patient's vital signs, pertinent lab work and imaging were reviewed and interpreted as discussed in the ED course. Hospitalization was considered for further testing, treatments, or serial exams/observation. However as patient is well-appearing, has a stable exam, and reassuring studies today, I do not feel that they warrant admission at this time. This plan was discussed with the patient who verbalizes agreement and comfort with this plan and seems reliable and able to return to the Emergency Department with worsening or changing symptoms.       Final diagnoses:  Ventral hernia without obstruction or gangrene    ED Discharge Orders     None          Desiderio Chew, PA-C 01/14/24 1443  "

## 2024-01-15 ENCOUNTER — Telehealth: Payer: Self-pay

## 2024-01-15 ENCOUNTER — Other Ambulatory Visit (HOSPITAL_COMMUNITY): Payer: Self-pay

## 2024-01-15 NOTE — Telephone Encounter (Signed)
 Pharmacy Patient Advocate Encounter   Received notification from Physician's Office that prior authorization for Zepbound  12.5 mg/0.5 ml is required/requested.   Insurance verification completed.   The patient is insured through [employer].   Per test claim: Per test claim, medication is not covered due to plan/benefit exclusion, PA not submitted at this time

## 2024-01-20 ENCOUNTER — Other Ambulatory Visit: Payer: Self-pay

## 2024-01-20 DIAGNOSIS — E7800 Pure hypercholesterolemia, unspecified: Secondary | ICD-10-CM

## 2024-01-20 DIAGNOSIS — I700 Atherosclerosis of aorta: Secondary | ICD-10-CM

## 2024-01-21 ENCOUNTER — Other Ambulatory Visit: Payer: Self-pay | Admitting: Primary Care

## 2024-01-21 ENCOUNTER — Other Ambulatory Visit: Payer: Self-pay | Admitting: Cardiology

## 2024-01-21 ENCOUNTER — Ambulatory Visit: Admitting: Internal Medicine

## 2024-01-21 DIAGNOSIS — I700 Atherosclerosis of aorta: Secondary | ICD-10-CM

## 2024-01-21 DIAGNOSIS — E7800 Pure hypercholesterolemia, unspecified: Secondary | ICD-10-CM

## 2024-01-21 MED ORDER — TRELEGY ELLIPTA 200-62.5-25 MCG/ACT IN AEPB: 1.0000 | INHALATION_SPRAY | Freq: Every day | RESPIRATORY_TRACT | 3 refills | Status: AC

## 2024-01-21 MED ORDER — ROSUVASTATIN CALCIUM 5 MG PO TABS: 5.0000 mg | ORAL_TABLET | Freq: Every day | ORAL | 2 refills | Status: AC

## 2024-01-21 MED ORDER — MONTELUKAST SODIUM 10 MG PO TABS: 10.0000 mg | ORAL_TABLET | Freq: Every day | ORAL | 1 refills | Status: AC

## 2024-01-21 NOTE — Telephone Encounter (Signed)
 Copied from CRM [id number]. Topic: Clinical - Medication Refill >> Jan 21, 2024 10:32 AM Dedra B wrote: Medication: montelukast  (SINGULAIR ) 10 MG tablet TRELEGY ELLIPTA  200-62.5-25 MCG/ACT AEPB  Has the patient contacted their pharmacy? Pharmacy is caller  This is the patient's preferred pharmacy:  [facility] - [place], [place] - [address] [place] [address] Phone: [phone] Fax: [phone]  Is this the correct pharmacy for this prescription? Yes  Has the prescription been filled recently? No  Is the patient out of the medication? Doesn't know  Has the patient been seen for an appointment in the last year OR does the patient have an upcoming appointment? Yes  Can we respond through MyChart? Yes  Agent: Please be advised that Rx refills may take up to 3 business days. We ask that you follow-up with your pharmacy.

## 2024-01-23 ENCOUNTER — Telehealth: Payer: Self-pay | Admitting: *Deleted

## 2024-01-23 MED ORDER — PANTOPRAZOLE SODIUM 40 MG PO TBEC: 40.0000 mg | DELAYED_RELEASE_TABLET | Freq: Every day | ORAL | 1 refills | Status: AC

## 2024-01-23 NOTE — Telephone Encounter (Signed)
 [facility]  Pharmacy is requesting a refill of Pantoprazole . Not on current medication list.  Okay to refill?

## 2024-01-23 NOTE — Telephone Encounter (Signed)
 Refill sent

## 2024-01-23 NOTE — Progress Notes (Signed)
 Sent message, via epic in basket, requesting orders in epic from Careers adviser.

## 2024-01-24 ENCOUNTER — Ambulatory Visit: Payer: Self-pay | Admitting: General Surgery

## 2024-01-27 ENCOUNTER — Other Ambulatory Visit: Payer: Self-pay | Admitting: Primary Care

## 2024-01-29 NOTE — Progress Notes (Addendum)
 Date of COVID positive in last 90 days:  PCP - Estela Theophilus Andrews, MD Cardiologist - Shelda Bruckner, MD Neurologist- Cinderella Jeans, MD Pulmonologist- Almarie Ferrari, NP  Chest CT- 11/18/23 Epic Chest x-ray - N/A EKG - 07/25/23 Epic Stress Test - N/A ECHO - 05/01/22 Epic Cardiac Cath - N/A Pacemaker/ICD device last checked:N/A Spinal Cord Stimulator:  Deep brain stimulator- patient knows to bring remote  Bowel Prep - N/A  Sleep Study - yes CPAP - does not need after weight loss  Fasting Blood Sugar - N/A Checks Blood Sugar _____ times a day  Last dose of GLP1 agonist-  Zepbound  GLP1 instructions:  Do not take after  01/29/24   Last dose of SGLT-2 inhibitors-  N/A SGLT-2 instructions:  Do not take after     Blood Thinner Instructions: N/A Last dose:   Time: Aspirin Instructions:N/A Last Dose:  Activity level: Can go up a flight of stairs and perform activities of daily living without stopping and without symptoms of chest pain or shortness of breath.  Anesthesia review: CHF, COPD, asthma, OSA, essential tremor  Patient denies shortness of breath, fever, cough and chest pain at PAT appointment  Patient verbalized understanding of instructions that were given to them at the PAT appointment. Patient was also instructed that they will need to review over the PAT instructions again at home before surgery.

## 2024-01-29 NOTE — Patient Instructions (Addendum)
 SURGICAL WAITING ROOM VISITATION  Patients having surgery or a procedure may have no more than 2 support people in the waiting area - these visitors may rotate.    Children ages 72 and under will not be able to visit patients in [facility] under most circumstances.   Visitors with respiratory illnesses are discouraged from visiting and should remain at home.  If the patient needs to stay at the hospital during part of their recovery, the visitor guidelines for inpatient rooms apply. Pre-op nurse will coordinate an appropriate time for 1 support person to accompany patient in pre-op.  This support person may not rotate.    Please refer to the [facility] website for the visitor guidelines for Inpatients (after your surgery is over and you are in a regular room).    Your procedure is scheduled on: 02/06/24   Report to [facility] Main Entrance    Report to admitting at 5:15 AM   Call this number if you have problems the morning of surgery [phone]   Do not eat food or drink liquids :After Midnight.          If you have questions, please contact your surgeons office.   FOLLOW BOWEL PREP AND ANY ADDITIONAL PRE OP INSTRUCTIONS YOU RECEIVED FROM YOUR SURGEON'S OFFICE!!!     Oral Hygiene is also important to reduce your risk of infection.                                    Remember - BRUSH YOUR TEETH THE MORNING OF SURGERY WITH YOUR REGULAR TOOTHPASTE  DENTURES WILL BE REMOVED PRIOR TO SURGERY PLEASE DO NOT APPLY Poly grip OR ADHESIVES!!!   Stop all vitamins and herbal supplements 7 days before surgery.   Do not take Zepbound  after 01/29/24.   Take these medicines the morning of surgery with A SIP OF WATER : Inhalers                              You may not have any metal on your body including jewelry, and body piercing             Do not wear lotions, powders, cologne, or deodorant              Men may shave face and neck.   Do not bring valuables to  the hospital. [place] [facility] IS NOT             RESPONSIBLE   FOR VALUABLES.   Contacts, glasses, dentures or bridgework may not be worn into surgery.  DO NOT BRING YOUR HOME MEDICATIONS TO THE HOSPITAL. PHARMACY WILL DISPENSE MEDICATIONS LISTED ON YOUR MEDICATION LIST TO YOU DURING YOUR ADMISSION IN THE HOSPITAL!    Patients discharged on the day of surgery will not be allowed to drive home.  Someone NEEDS to stay with you for the first 24 hours after anesthesia.              Please read over the following fact sheets you were given: IF YOU HAVE QUESTIONS ABOUT YOUR PRE-OP INSTRUCTIONS PLEASE CALL [phone] [place].   If you received a COVID test during your pre-op visit  it is requested that you wear a mask when out in public, stay away from anyone that may not be feeling well and notify your surgeon if you develop symptoms.  If you test positive for Covid or have been in contact with anyone that has tested positive in the last 10 days please notify you surgeon.    [facility] - Preparing for Surgery Before surgery, you can play an important role.  Because skin is not sterile, your skin needs to be as free of germs as possible.  You can reduce the number of germs on your skin by washing with CHG (chlorahexidine gluconate) soap before surgery.  CHG is an antiseptic cleaner which kills germs and bonds with the skin to continue killing germs even after washing. Please DO NOT use if you have an allergy to CHG or antibacterial soaps.  If your skin becomes reddened/irritated stop using the CHG and inform your nurse when you arrive at Short Stay. Do not shave (including legs and underarms) for at least 48 hours prior to the first CHG shower.  You may shave your face/neck.  Please follow these instructions carefully:  1.  Shower with CHG Soap the night before surgery ONLY (DO NOT USE THE SOAP THE MORNING OF SURGERY).  2.  If you choose to wash your hair, wash your hair first as usual with your normal   shampoo.  3.  After you shampoo, rinse your hair and body thoroughly to remove the shampoo.                             4.  Use CHG as you would any other liquid soap.  You can apply chg directly to the skin and wash.  Gently with a scrungie or clean washcloth.  5.  Apply the CHG Soap to your body ONLY FROM THE NECK DOWN.   Do   not use on face/ open                           Wound or open sores. Avoid contact with eyes, ears mouth and   genitals (private parts).                       Wash face,  Genitals (private parts) with your normal soap.             6.  Wash thoroughly, paying special attention to the area where your    surgery  will be performed.  7.  Thoroughly rinse your body with warm water  from the neck down.  8.  DO NOT shower/wash with your normal soap after using and rinsing off the CHG Soap.                9.  Pat yourself dry with a clean towel.            10.  Wear clean pajamas.            11.  Place clean sheets on your bed the night of your first shower and do not  sleep with pets. Day of Surgery : Do not apply any CHG, lotions/deodorants the morning of surgery.  Please wear clean clothes to the hospital/surgery center.  FAILURE TO FOLLOW THESE INSTRUCTIONS MAY RESULT IN THE CANCELLATION OF YOUR SURGERY  PATIENT SIGNATURE_________________________________  NURSE SIGNATURE__________________________________  ________________________________________________________________________

## 2024-01-30 ENCOUNTER — Other Ambulatory Visit: Payer: Self-pay

## 2024-01-30 ENCOUNTER — Other Ambulatory Visit: Payer: Self-pay | Admitting: Internal Medicine

## 2024-01-30 ENCOUNTER — Encounter (HOSPITAL_COMMUNITY): Payer: Self-pay

## 2024-01-30 ENCOUNTER — Encounter (HOSPITAL_COMMUNITY)
Admission: RE | Admit: 2024-01-30 | Discharge: 2024-01-30 | Disposition: A | Discharge status: Home / Self Care | Source: Ambulatory Visit | Attending: General Surgery | Admitting: General Surgery

## 2024-01-30 DIAGNOSIS — K219 Gastro-esophageal reflux disease without esophagitis: Secondary | ICD-10-CM | POA: Diagnosis not present

## 2024-01-30 DIAGNOSIS — K432 Incisional hernia without obstruction or gangrene: Secondary | ICD-10-CM | POA: Insufficient documentation

## 2024-01-30 DIAGNOSIS — M1990 Unspecified osteoarthritis, unspecified site: Secondary | ICD-10-CM | POA: Insufficient documentation

## 2024-01-30 DIAGNOSIS — Z01818 Encounter for other preprocedural examination: Secondary | ICD-10-CM | POA: Insufficient documentation

## 2024-01-30 DIAGNOSIS — I700 Atherosclerosis of aorta: Secondary | ICD-10-CM | POA: Diagnosis not present

## 2024-01-30 DIAGNOSIS — F32A Depression, unspecified: Secondary | ICD-10-CM | POA: Diagnosis not present

## 2024-01-30 DIAGNOSIS — G4733 Obstructive sleep apnea (adult) (pediatric): Secondary | ICD-10-CM | POA: Insufficient documentation

## 2024-01-30 DIAGNOSIS — E66811 Obesity, class 1: Secondary | ICD-10-CM

## 2024-01-30 DIAGNOSIS — F419 Anxiety disorder, unspecified: Secondary | ICD-10-CM | POA: Insufficient documentation

## 2024-01-30 DIAGNOSIS — Z87891 Personal history of nicotine dependence: Secondary | ICD-10-CM | POA: Insufficient documentation

## 2024-01-30 DIAGNOSIS — J449 Chronic obstructive pulmonary disease, unspecified: Secondary | ICD-10-CM | POA: Diagnosis not present

## 2024-01-30 DIAGNOSIS — Z8546 Personal history of malignant neoplasm of prostate: Secondary | ICD-10-CM | POA: Diagnosis not present

## 2024-01-31 ENCOUNTER — Telehealth (HOSPITAL_BASED_OUTPATIENT_CLINIC_OR_DEPARTMENT_OTHER): Payer: Self-pay

## 2024-01-31 ENCOUNTER — Telehealth (HOSPITAL_BASED_OUTPATIENT_CLINIC_OR_DEPARTMENT_OTHER): Payer: Self-pay | Admitting: *Deleted

## 2024-01-31 NOTE — Telephone Encounter (Signed)
" ° °  Name: Allen [name]  DOB: [birth year]  MRN: [id number]  Primary Cardiologist: Shelda Bruckner, MD   Preoperative team, please contact this patient and set up a phone call appointment for further preoperative risk assessment. Please obtain consent and complete medication review. Thank you for your help.  I confirm that guidance regarding antiplatelet and oral anticoagulation therapy has been completed and, if necessary, noted below: - It does not look like he is on any antiplatelet/ anticoagulation therapy.   I also confirmed the patient resides in the state of [place] . As per [facility] Medical Board telemedicine laws, the patient must reside in the state in which the provider is licensed.   Zollie Clemence E Lynnlee Revels, PA-C 01/31/2024, 2:20 PM [facility]    "

## 2024-01-31 NOTE — Telephone Encounter (Signed)
 Called and left the patient a detailed message to call back and schedule pre-op clearance. 1st attempt.

## 2024-01-31 NOTE — Telephone Encounter (Signed)
"  ° °  Pre-operative Risk Assessment    Patient Name: Allen [name]  DOB: [birth year] MRN: [id number]   Date of last office visit: 10/08/2023 with Dr. Lonni  Date of next office visit: None   Request for Surgical Clearance    Procedure:  Right total knee arthroplasty   Date of Surgery:  Clearance TBD - Pt wants early April (after March 28th)                                 Surgeon:  Norleen Gavel, MD Surgeon's Group or Practice Name:  [facility] Phone number:  [phone] Fax number:  [phone]   Type of Clearance Requested:   - Medical    Type of Anesthesia:  Spinal   Additional requests/questions:  Length of surgery 1.5 hours at [facility]   01/31/2024, 11:25 AM   "

## 2024-01-31 NOTE — Telephone Encounter (Signed)
 Pt has been scheduled tele preop appt 03/23/24. Pt states surgeon trying to get scheduled 1st or 2nd week of April 2026. Med rec and consent are done.

## 2024-02-03 ENCOUNTER — Other Ambulatory Visit: Payer: Self-pay | Admitting: Internal Medicine

## 2024-02-03 DIAGNOSIS — E6609 Other obesity due to excess calories: Secondary | ICD-10-CM

## 2024-02-03 MED ORDER — ZEPBOUND 12.5 MG/0.5ML ~~LOC~~ SOLN: 12.5000 mg | SUBCUTANEOUS | 0 refills | Status: DC

## 2024-02-03 NOTE — Telephone Encounter (Signed)
 Patient would like a refill of Zepbound  12.5 vial sent to [facility].  He also wanted Dr Theophilus to know that he is scheduled for ACL surgery in 04/2024.

## 2024-02-04 ENCOUNTER — Encounter (HOSPITAL_COMMUNITY): Payer: Self-pay

## 2024-02-04 NOTE — Progress Notes (Signed)
 " Case: [id number] Date/Time: 02/06/24 0715   Procedure: REPAIR, HERNIA, UMBILICAL, ROBOT-ASSISTED - ROBOTIC ASSISTED INCISIONAL HERNIA REPAIR   Anesthesia type: General   Pre-op diagnosis: INCISIONAL HERNIA   Location: [facility]OM 02 / [facility]   Surgeons: Ann Fine, MD       DISCUSSION: Allen [name] is a 74 yo male with PMH of former smoking, aortic atherosclerosis, COPD, pulmonary nodules, OSA (resolved after weight loss), S/P deep brain stimulator placement (12/2014), GERD, anxiety, depression, arthritis, prostate cancer s/p prostatectomy (07/2023).  Patient follows with Cardiology for hx of aortic atherosclerosis and CV risk factors. Last seen by Dr. Lonni on 10/08/23. Echo in 04/2022 showed low normal LVEF 50-55%, grade I DD, without significant valvular disease. He was counseled on risk factor modification and advised f/u in 1 year.  Patient follows with Pulmonology for hx of COPD, asthma, mild OSA. No CPAP use after weight loss. Last seen on 12/31/23 by NP Hope. He is on inhalers, No recent exacerbations noted. Noted to be low risk for respiratory complications due to vaccinations and stable condition. Advised f/u in 6 months.  Follows with Neurology for hx of essential tremor s/p DBS in 2016. Last seen on 11/27/23. Primidone  dose was increased. DBS was programmed. Following precaution advised: Cautions: Patient cannot have diathermy. Before MRI, mammogram, electrocautery or elective heart defibrillation please call [employer] [phone] to make sure the procedure is safe  Seen in the ED on 11/18/23 for fall and was noted to have 3 right rib fx from 8-10.  LD Zepbound : 1/14:  VS: BP 132/73   Pulse 66   Temp 36.7 C (Oral)   Resp 16   Ht 5' 10 (1.778 m)   Wt 99.8 kg   SpO2 99%   BMI 31.57 kg/m   PROVIDERS: Theophilus Andrews, Tully GRADE, MD   LABS: Labs reviewed: Acceptable for surgery. (all labs ordered are listed, but only abnormal results are displayed)  Labs  Reviewed - No data to display   CT Chest/Abd/Pelvis 11/18/23:  IMPRESSION: Fractures of the right eighth, ninth and tenth ribs. There is an associated small pleural effusion with atelectasis. No pneumothorax.   No other significant abnormality    Echo 05/01/2022:  IMPRESSIONS    1. Left ventricular ejection fraction, by estimation, is 50 to 55%. The left ventricle has low normal function. The left ventricle has no regional wall motion abnormalities. There is mild left ventricular hypertrophy. Left ventricular diastolic parameters are consistent with Grade I diastolic dysfunction (impaired relaxation).  2. Right ventricular systolic function is normal. The right ventricular size is normal. There is normal pulmonary artery systolic pressure. The estimated right ventricular systolic pressure is 29.7 mmHg.  3. Left atrial size was mild to moderately dilated.  4. The mitral valve is normal in structure. Trivial mitral valve regurgitation. No evidence of mitral stenosis.  5. The aortic valve is tricuspid. Aortic valve regurgitation is not visualized. No aortic stenosis is present.  6. The inferior vena cava is normal in size with <50% respiratory variability, suggesting right atrial pressure of 8 mmHg.  Comparison(s): A prior study was performed on 05/29/19. Prior images reviewed side by side. Compared to prior, LVEF appears slightly lower. Past Medical History:  Diagnosis Date   Anxiety    Arthritis    neck -   BPH with obstruction/lower urinary tract symptoms    urologist-- dr pace   Cancer [facility])    basal cell to nose   Centrilobular emphysema (HCC)    followed  by dr shellia   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[facility])    followed by dr shellia   Cough 07/2020   covid residual, no congestion   Depression    DOE (dyspnea on exertion)    due to copd/ emphysema   ED (erectile dysfunction)    Essential tremor    neurologist--- dr tat----  bilateral upper extremities,  s/p DPS 12/ 2016   GERD  (gastroesophageal reflux disease)    History of 2019 novel coronavirus disease (COVID-19) 07/18/2020   per pt postive home test 07-18-2020 w/ mild  symptoms per pt; pt stated had urgent care visit 07-24-2020 for sob/ cough with negative cxr (in care everywhere) ;  follow up pcp visit 08-03-2020 in epic   Insomnia    Mixed simple and mucopurulent chronic bronchitis (HCC)    OSA (obstructive sleep apnea)    followed by dr shellia---- no longer does uses bipap, intolerant --- pt prescriped  Oxygen   2 L oxygen  via [place] while sleeping  (study in epic 04/ 2017 [facility] 40)   Perennial allergic rhinitis    Pneumonia    Prostate cancer (HCC)    Pulmonary nodules    followed by dr shellia   S/P deep brain stimulator placement 12/2014   followed by neurology-----IPG change 01-21-2020 for end-of life battery  (pt has a control)   Varicose vein of leg    per pt has had 3 procedure's last one 2006   Wears dentures    upper    Past Surgical History:  Procedure Laterality Date   ANKLE SURGERY Right 2020   tendon repair   APPENDECTOMY     CARDIAC CATHETERIZATION     CARPAL TUNNEL RELEASE Left 2019   CATARACT EXTRACTION W/ INTRAOCULAR LENS IMPLANT Bilateral 2020   COLONOSCOPY     CYSTOSCOPY WITH URETHRAL DILATATION N/A 08/29/2021   Procedure: CYSTOSCOPY WITH URETHRAL DILATATION;  Surgeon: Elisabeth Valli BIRCH, MD;  [facility];  Service: Urology;  Laterality: N/A;   DEEP BRAIN STIMULATOR PLACEMENT Left 2016   INGUINAL HERNIA REPAIR Right 2007   KNEE ARTHROSCOPY W/ MENISCECTOMY Right 06/25/2022   ROBOT ASSISTED LAPAROSCOPIC RADICAL PROSTATECTOMY N/A 08/01/2023   Procedure: PROSTATECTOMY, RADICAL, ROBOT-ASSISTED, LAPAROSCOPIC;  Surgeon: Renda Glance, MD;  [facility];  Service: Urology;  Laterality: N/A;  LEVEL 3   ROTATOR CUFF REPAIR Right 2014   SUBTHALAMIC STIMULATOR BATTERY REPLACEMENT N/A 01/21/2020   Procedure: Deep brain stimulator battery replacement;  Surgeon: Unice Pac, MD;  Location: [facility]  OR;  Service: Neurosurgery;  Laterality: N/A;   TONSILLECTOMY AND ADENOIDECTOMY     age 24   TOTAL SHOULDER REPLACEMENT Left 2016   TRANSURETHRAL RESECTION OF PROSTATE N/A 08/09/2020   Procedure: TRANSURETHRAL RESECTION OF THE PROSTATE (TURP), BLADDER BIOPSY AND FULGERATION;  Surgeon: Elisabeth Valli BIRCH, MD;  Location: [facility];  Service: Urology;  Laterality: N/A;   TRANSURETHRAL RESECTION OF PROSTATE N/A 08/29/2021   Procedure: RE-TRANSURETHRAL RESECTION OF THE PROSTATE (TURP);  Surgeon: Elisabeth Valli BIRCH, MD;  [facility];  Service: Urology;  Laterality: N/A;   VEIN SURGERY Left 2006   varicose veins    MEDICATIONS:  albuterol  (PROVENTIL ) (2.5 MG/3ML) 0.083% nebulizer solution   albuterol  (VENTOLIN  HFA) 108 (90 Base) MCG/ACT inhaler   Azelastine -Fluticasone  137-50 MCG/ACT SUSP   busPIRone  (BUSPAR ) 30 MG tablet   clonazePAM (KLONOPIN) 0.5 MG tablet   Cyanocobalamin  (VITAMIN B12 PO)   escitalopram  (LEXAPRO ) 20 MG tablet   Fluticasone -Umeclidin-Vilant (TRELEGY ELLIPTA ) 200-62.5-25 MCG/ACT AEPB  lidocaine  (LIDODERM ) 5 %   methocarbamol  (ROBAXIN ) 500 MG tablet   montelukast  (SINGULAIR ) 10 MG tablet   Multiple Vitamin (MULTIVITAMIN ADULT PO)   mupirocin ointment (BACTROBAN) 2 %   Omega-3 Fatty Acids (FISH OIL PO)   oxyCODONE  (ROXICODONE ) 5 MG immediate release tablet   pantoprazole  (PROTONIX ) 40 MG tablet   primidone  (MYSOLINE ) 50 MG tablet   rosuvastatin  (CRESTOR ) 5 MG tablet   tadalafil  (CIALIS ) 20 MG tablet   TIRZEPATIDE  [place]   Tirzepatide -Weight Management (ZEPBOUND ) 12.5 MG/0.5ML SOLN   traZODone  (DESYREL ) 100 MG tablet   triamcinolone  (KENALOG ) 0.025 % cream   Turmeric (QC TUMERIC COMPLEX PO)   VITAMIN D  PO   zaleplon (SONATA) 10 MG capsule   No current facility-administered medications for this encounter.    Burnard CHRISTELLA Odis DEVONNA [facility] Short Stay/Anesthesiology [facility] Phone [phone] 02/04/2024 11:14 AM       "

## 2024-02-04 NOTE — Anesthesia Preprocedure Evaluation (Signed)
"                                    Anesthesia Evaluation    Airway        Dental   Pulmonary former smoker          Cardiovascular      Neuro/Psych    GI/Hepatic   Endo/Other    Renal/GU      Musculoskeletal   Abdominal   Peds  Hematology   Anesthesia Other Findings   Reproductive/Obstetrics                              Anesthesia Physical Anesthesia Plan  ASA:   Anesthesia Plan:    Post-op Pain Management:    Induction:   PONV Risk Score and Plan:   Airway Management Planned:   Additional Equipment:   Intra-op Plan:   Post-operative Plan:   Informed Consent:   Plan Discussed with:   Anesthesia Plan Comments: (See PAT note from 1/15)         Anesthesia Quick Evaluation  "

## 2024-02-06 ENCOUNTER — Ambulatory Visit (HOSPITAL_BASED_OUTPATIENT_CLINIC_OR_DEPARTMENT_OTHER): Payer: Self-pay | Admitting: Anesthesiology

## 2024-02-06 ENCOUNTER — Encounter (HOSPITAL_COMMUNITY): Payer: Self-pay | Admitting: Medical

## 2024-02-06 ENCOUNTER — Ambulatory Visit: Admitting: Primary Care

## 2024-02-06 ENCOUNTER — Other Ambulatory Visit: Payer: Self-pay

## 2024-02-06 ENCOUNTER — Encounter (HOSPITAL_COMMUNITY): Payer: Self-pay | Admitting: General Surgery

## 2024-02-06 ENCOUNTER — Observation Stay (HOSPITAL_COMMUNITY)
Admission: RE | Admit: 2024-02-06 | Discharge: 2024-02-07 | Disposition: A | Discharge status: Home / Self Care | Payer: Self-pay | Source: Ambulatory Visit | Attending: General Surgery | Admitting: General Surgery

## 2024-02-06 ENCOUNTER — Encounter (HOSPITAL_COMMUNITY)
Admission: RE | Disposition: A | Discharge status: Home / Self Care | Payer: Self-pay | Source: Ambulatory Visit | Attending: General Surgery

## 2024-02-06 DIAGNOSIS — J4489 Other specified chronic obstructive pulmonary disease: Secondary | ICD-10-CM | POA: Insufficient documentation

## 2024-02-06 DIAGNOSIS — Z85828 Personal history of other malignant neoplasm of skin: Secondary | ICD-10-CM | POA: Diagnosis not present

## 2024-02-06 DIAGNOSIS — Z87891 Personal history of nicotine dependence: Secondary | ICD-10-CM | POA: Diagnosis not present

## 2024-02-06 DIAGNOSIS — Z8546 Personal history of malignant neoplasm of prostate: Secondary | ICD-10-CM | POA: Insufficient documentation

## 2024-02-06 DIAGNOSIS — I5032 Chronic diastolic (congestive) heart failure: Secondary | ICD-10-CM | POA: Diagnosis not present

## 2024-02-06 DIAGNOSIS — K432 Incisional hernia without obstruction or gangrene: Principal | ICD-10-CM | POA: Insufficient documentation

## 2024-02-06 MED ORDER — ROSUVASTATIN CALCIUM 10 MG PO TABS
5.0000 mg | ORAL_TABLET | Freq: Every day | ORAL | Status: DC
  Administered 2024-02-06 – 2024-02-07 (×2): 5 mg via ORAL
  Filled 2024-02-06 (×2): qty 1

## 2024-02-06 MED ORDER — SUGAMMADEX SODIUM 200 MG/2ML IV SOLN
INTRAVENOUS | Status: DC | PRN
  Administered 2024-02-06: 200 mg via INTRAVENOUS

## 2024-02-06 MED ORDER — CEFAZOLIN SODIUM-DEXTROSE 2-4 GM/100ML-% IV SOLN
2.0000 g | INTRAVENOUS | Status: AC
  Administered 2024-02-06: 2 g via INTRAVENOUS
  Filled 2024-02-06: qty 100

## 2024-02-06 MED ORDER — LIDOCAINE HCL (PF) 2 % IJ SOLN
INTRAMUSCULAR | Status: AC
  Filled 2024-02-06: qty 5

## 2024-02-06 MED ORDER — PHENYLEPHRINE HCL-NACL 20-0.9 MG/250ML-% IV SOLN
INTRAVENOUS | Status: DC | PRN
  Administered 2024-02-06: 20 ug/min via INTRAVENOUS

## 2024-02-06 MED ORDER — PRIMIDONE 50 MG PO TABS
100.0000 mg | ORAL_TABLET | Freq: Every day | ORAL | Status: DC
  Administered 2024-02-06: 100 mg via ORAL
  Filled 2024-02-06: qty 2

## 2024-02-06 MED ORDER — FENTANYL CITRATE (PF) 100 MCG/2ML IJ SOLN
INTRAMUSCULAR | Status: AC
  Filled 2024-02-06: qty 2

## 2024-02-06 MED ORDER — DROPERIDOL 2.5 MG/ML IJ SOLN: 0.6250 mg | Freq: Once | INTRAMUSCULAR | Status: DC | PRN

## 2024-02-06 MED ORDER — ORAL CARE MOUTH RINSE: 15.0000 mL | Freq: Once | OROMUCOSAL | Status: AC

## 2024-02-06 MED ORDER — HYDROMORPHONE HCL 1 MG/ML IJ SOLN
INTRAMUSCULAR | Status: AC
  Filled 2024-02-06: qty 1

## 2024-02-06 MED ORDER — LIDOCAINE HCL (PF) 2 % IJ SOLN
INTRAMUSCULAR | Status: DC | PRN
  Administered 2024-02-06: 100 mg via INTRADERMAL

## 2024-02-06 MED ORDER — LACTATED RINGERS IV SOLN: INTRAVENOUS | Status: DC

## 2024-02-06 MED ORDER — CHLORHEXIDINE GLUCONATE CLOTH 2 % EX PADS
6.0000 | MEDICATED_PAD | Freq: Once | CUTANEOUS | Status: DC
  Administered 2024-02-06: 6 via TOPICAL

## 2024-02-06 MED ORDER — FENTANYL CITRATE (PF) 100 MCG/2ML IJ SOLN
INTRAMUSCULAR | Status: DC | PRN
  Administered 2024-02-06 (×3): 50 ug via INTRAVENOUS
  Administered 2024-02-06: 100 ug via INTRAVENOUS
  Administered 2024-02-06: 50 ug via INTRAVENOUS

## 2024-02-06 MED ORDER — DEXAMETHASONE SOD PHOSPHATE PF 10 MG/ML IJ SOLN
INTRAMUSCULAR | Status: AC
  Filled 2024-02-06: qty 1

## 2024-02-06 MED ORDER — HYDROMORPHONE HCL 1 MG/ML IJ SOLN: 1.0000 mg | INTRAMUSCULAR | Status: DC | PRN

## 2024-02-06 MED ORDER — ONDANSETRON 4 MG PO TBDP: 4.0000 mg | ORAL_TABLET | Freq: Four times a day (QID) | ORAL | Status: DC | PRN

## 2024-02-06 MED ORDER — DEXMEDETOMIDINE HCL IN NACL 80 MCG/20ML IV SOLN
INTRAVENOUS | Status: AC
  Filled 2024-02-06: qty 20

## 2024-02-06 MED ORDER — ALBUTEROL SULFATE (2.5 MG/3ML) 0.083% IN NEBU: 2.5000 mg | INHALATION_SOLUTION | Freq: Four times a day (QID) | RESPIRATORY_TRACT | Status: DC | PRN

## 2024-02-06 MED ORDER — ONDANSETRON HCL 4 MG/2ML IJ SOLN: 4.0000 mg | Freq: Four times a day (QID) | INTRAMUSCULAR | Status: DC | PRN

## 2024-02-06 MED ORDER — PRIMIDONE 50 MG PO TABS
150.0000 mg | ORAL_TABLET | Freq: Every day | ORAL | Status: DC
  Administered 2024-02-07: 150 mg via ORAL
  Filled 2024-02-06: qty 3

## 2024-02-06 MED ORDER — GLYCOPYRROLATE 0.2 MG/ML IJ SOLN
INTRAMUSCULAR | Status: DC | PRN
  Administered 2024-02-06: .2 mg via INTRAVENOUS

## 2024-02-06 MED ORDER — HYDROMORPHONE HCL 1 MG/ML IJ SOLN
0.2500 mg | INTRAMUSCULAR | Status: DC | PRN
  Administered 2024-02-06 (×4): 0.5 mg via INTRAVENOUS

## 2024-02-06 MED ORDER — TRAZODONE HCL 50 MG PO TABS
150.0000 mg | ORAL_TABLET | Freq: Every day | ORAL | Status: DC
  Administered 2024-02-06: 150 mg via ORAL
  Filled 2024-02-06: qty 3

## 2024-02-06 MED ORDER — POLYETHYLENE GLYCOL 3350 17 G PO PACK: 17.0000 g | PACK | Freq: Every day | ORAL | Status: DC | PRN

## 2024-02-06 MED ORDER — GLYCOPYRROLATE 0.2 MG/ML IJ SOLN
INTRAMUSCULAR | Status: AC
  Filled 2024-02-06: qty 1

## 2024-02-06 MED ORDER — SENNA 8.6 MG PO TABS
1.0000 | ORAL_TABLET | Freq: Two times a day (BID) | ORAL | Status: DC
  Administered 2024-02-06 – 2024-02-07 (×2): 8.6 mg via ORAL
  Filled 2024-02-06 (×2): qty 1

## 2024-02-06 MED ORDER — ACETAMINOPHEN 500 MG PO TABS
1000.0000 mg | ORAL_TABLET | Freq: Four times a day (QID) | ORAL | Status: DC
  Administered 2024-02-06 (×3): 1000 mg via ORAL
  Filled 2024-02-06 (×4): qty 2

## 2024-02-06 MED ORDER — PROPOFOL 10 MG/ML IV BOLUS
INTRAVENOUS | Status: AC
  Filled 2024-02-06: qty 20

## 2024-02-06 MED ORDER — BUDESON-GLYCOPYRROL-FORMOTEROL 160-9-4.8 MCG/ACT IN AERO
2.0000 | INHALATION_SPRAY | Freq: Two times a day (BID) | RESPIRATORY_TRACT | Status: DC
  Administered 2024-02-06 – 2024-02-07 (×2): 2 via RESPIRATORY_TRACT
  Filled 2024-02-06: qty 5.9

## 2024-02-06 MED ORDER — ONDANSETRON HCL 4 MG/2ML IJ SOLN
INTRAMUSCULAR | Status: DC | PRN
  Administered 2024-02-06: 4 mg via INTRAVENOUS

## 2024-02-06 MED ORDER — DOCUSATE SODIUM 100 MG PO CAPS
100.0000 mg | ORAL_CAPSULE | Freq: Two times a day (BID) | ORAL | Status: DC
  Administered 2024-02-06 – 2024-02-07 (×2): 100 mg via ORAL
  Filled 2024-02-06 (×2): qty 1

## 2024-02-06 MED ORDER — MONTELUKAST SODIUM 10 MG PO TABS
10.0000 mg | ORAL_TABLET | Freq: Every day | ORAL | Status: DC
  Administered 2024-02-06: 10 mg via ORAL
  Filled 2024-02-06: qty 1

## 2024-02-06 MED ORDER — ACETAMINOPHEN 500 MG PO TABS
1000.0000 mg | ORAL_TABLET | Freq: Once | ORAL | Status: DC
  Administered 2024-02-06: 1000 mg via ORAL
  Filled 2024-02-06: qty 2

## 2024-02-06 MED ORDER — CHLORHEXIDINE GLUCONATE 0.12 % MT SOLN
15.0000 mL | Freq: Once | OROMUCOSAL | Status: AC
  Administered 2024-02-06: 15 mL via OROMUCOSAL

## 2024-02-06 MED ORDER — DEXMEDETOMIDINE HCL IN NACL 80 MCG/20ML IV SOLN
INTRAVENOUS | Status: DC | PRN
  Administered 2024-02-06: 8 ug via INTRAVENOUS

## 2024-02-06 MED ORDER — ENOXAPARIN SODIUM 40 MG/0.4ML IJ SOSY
40.0000 mg | PREFILLED_SYRINGE | Freq: Once | INTRAMUSCULAR | Status: AC
  Administered 2024-02-06: 40 mg via SUBCUTANEOUS
  Filled 2024-02-06: qty 0.4

## 2024-02-06 MED ORDER — BUPIVACAINE LIPOSOME 1.3 % IJ SUSP
INTRAMUSCULAR | Status: AC
  Filled 2024-02-06: qty 20

## 2024-02-06 MED ORDER — GABAPENTIN 300 MG PO CAPS
300.0000 mg | ORAL_CAPSULE | ORAL | Status: AC
  Administered 2024-02-06: 300 mg via ORAL
  Filled 2024-02-06: qty 1

## 2024-02-06 MED ORDER — ROCURONIUM BROMIDE 10 MG/ML (PF) SYRINGE
PREFILLED_SYRINGE | INTRAVENOUS | Status: DC | PRN
  Administered 2024-02-06: 60 mg via INTRAVENOUS
  Administered 2024-02-06: 10 mg via INTRAVENOUS
  Administered 2024-02-06: 20 mg via INTRAVENOUS
  Administered 2024-02-06: 10 mg via INTRAVENOUS
  Administered 2024-02-06 (×2): 20 mg via INTRAVENOUS
  Administered 2024-02-06: 10 mg via INTRAVENOUS

## 2024-02-06 MED ORDER — ROCURONIUM BROMIDE 10 MG/ML (PF) SYRINGE
PREFILLED_SYRINGE | INTRAVENOUS | Status: AC
  Filled 2024-02-06: qty 10

## 2024-02-06 MED ORDER — HYDRALAZINE HCL 20 MG/ML IJ SOLN: 10.0000 mg | INTRAMUSCULAR | Status: DC | PRN

## 2024-02-06 MED ORDER — ONDANSETRON HCL 4 MG/2ML IJ SOLN
INTRAMUSCULAR | Status: AC
  Filled 2024-02-06: qty 2

## 2024-02-06 MED ORDER — OXYCODONE HCL 5 MG PO TABS
5.0000 mg | ORAL_TABLET | ORAL | Status: DC | PRN
  Administered 2024-02-06 (×2): 10 mg via ORAL
  Administered 2024-02-07: 5 mg via ORAL
  Filled 2024-02-06 (×2): qty 2
  Filled 2024-02-06: qty 1

## 2024-02-06 MED ORDER — BUPIVACAINE-EPINEPHRINE (PF) 0.25% -1:200000 IJ SOLN
INTRAMUSCULAR | Status: AC
  Filled 2024-02-06: qty 30

## 2024-02-06 MED ORDER — ESCITALOPRAM OXALATE 20 MG PO TABS
20.0000 mg | ORAL_TABLET | Freq: Every day | ORAL | Status: DC
  Administered 2024-02-06: 20 mg via ORAL
  Filled 2024-02-06: qty 1

## 2024-02-06 MED ORDER — DEXAMETHASONE SOD PHOSPHATE PF 10 MG/ML IJ SOLN
INTRAMUSCULAR | Status: DC | PRN
  Administered 2024-02-06: 5 mg via INTRAVENOUS

## 2024-02-06 MED ORDER — PROPOFOL 10 MG/ML IV BOLUS
INTRAVENOUS | Status: DC | PRN
  Administered 2024-02-06: 120 mg via INTRAVENOUS

## 2024-02-06 MED ORDER — ACETAMINOPHEN 500 MG PO TABS
1000.0000 mg | ORAL_TABLET | ORAL | Status: AC
  Administered 2024-02-06: 1000 mg via ORAL

## 2024-02-06 MED ORDER — 0.9 % SODIUM CHLORIDE (POUR BTL) OPTIME
TOPICAL | Status: DC | PRN
  Administered 2024-02-06: 1000 mL

## 2024-02-06 MED ORDER — SUGAMMADEX SODIUM 200 MG/2ML IV SOLN
INTRAVENOUS | Status: AC
  Filled 2024-02-06: qty 2

## 2024-02-06 MED ORDER — ENOXAPARIN SODIUM 40 MG/0.4ML IJ SOSY
40.0000 mg | PREFILLED_SYRINGE | INTRAMUSCULAR | Status: DC
  Administered 2024-02-07: 40 mg via SUBCUTANEOUS
  Filled 2024-02-06: qty 0.4

## 2024-02-06 MED ORDER — PHENYLEPHRINE 80 MCG/ML (10ML) SYRINGE FOR IV PUSH (FOR BLOOD PRESSURE SUPPORT)
PREFILLED_SYRINGE | INTRAVENOUS | Status: DC | PRN
  Administered 2024-02-06: 160 ug via INTRAVENOUS

## 2024-02-06 MED ORDER — BUPIVACAINE-EPINEPHRINE 0.25% -1:200000 IJ SOLN
INTRAMUSCULAR | Status: DC | PRN
  Administered 2024-02-06: 30 mL

## 2024-02-06 NOTE — H&P (Signed)
 "   HPI  Allen [name] is an 74 y.o. male who was seen in clinic on 12/24/23 for incisional hernia.  Patient had robotic prostatectomy in July with Dr. Renda. At that time he was told that his fascia was very thin and at risk for developing a hernia. He did not really notice an issue until he had a fall in November sustaining several rib fractures. After that incident he began to notice a hernia at site of one of his incisions. It has continued to slightly increase in size. It is reducible. No obstructive symptoms, no signs of incarceration or strangulation. No overlying skin changes.   He is not on any blood thinners. He also has history of right inguinal hernia repair.   Since seen in clinic, patient presented to ED due to pain from hernia and increase in size. On CT imaging now contained bowel without signs of strangulation and was easily reducible per EDP notes. Given his increase in pain and discomfort, surgery date moved up.  Pulmonology cleared patient for surgery but recommended admission for observation post-op in step down unit.  10 point review of systems is negative except as listed above in HPI.  Objective  Past Medical History: Past Medical History:  Diagnosis Date   Anxiety    Arthritis    neck -   BPH with obstruction/lower urinary tract symptoms    urologist-- dr pace   Cancer [facility])    basal cell to nose   Centrilobular emphysema (HCC)    followed by dr shellia   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[facility])    followed by dr shellia   Cough 07/2020   covid residual, no congestion   Depression    DOE (dyspnea on exertion)    due to copd/ emphysema   ED (erectile dysfunction)    Essential tremor    neurologist--- dr tat----  bilateral upper extremities,  s/p DPS 12/ 2016   GERD (gastroesophageal reflux disease)    History of 2019 novel coronavirus disease (COVID-19) 07/18/2020   per pt postive home test 07-18-2020 w/ mild  symptoms per pt; pt stated had urgent care visit 07-24-2020 for  sob/ cough with negative cxr (in care everywhere) ;  follow up pcp visit 08-03-2020 in epic   Insomnia    Mixed simple and mucopurulent chronic bronchitis (HCC)    OSA (obstructive sleep apnea)    followed by dr shellia---- no longer does uses bipap, intolerant --- pt prescriped  Oxygen   2 L oxygen  via [place] while sleeping  (study in epic 04/ 2017 [facility] 40)   Perennial allergic rhinitis    Pneumonia    Prostate cancer (HCC)    Pulmonary nodules    followed by dr shellia   S/P deep brain stimulator placement 12/2014   followed by neurology-----IPG change 01-21-2020 for end-of life battery  (pt has a control)   Varicose vein of leg    per pt has had 3 procedure's last one 2006   Wears dentures    upper    Past Surgical History: Past Surgical History:  Procedure Laterality Date   ANKLE SURGERY Right 2020   tendon repair   APPENDECTOMY     CARDIAC CATHETERIZATION     CARPAL TUNNEL RELEASE Left 2019   CATARACT EXTRACTION W/ INTRAOCULAR LENS IMPLANT Bilateral 2020   COLONOSCOPY     CYSTOSCOPY WITH URETHRAL DILATATION N/A 08/29/2021   Procedure: CYSTOSCOPY WITH URETHRAL DILATATION;  Surgeon: Elisabeth Valli BIRCH, MD;  [facility];  Service: Urology;  Laterality: N/A;   DEEP BRAIN STIMULATOR PLACEMENT Left 2016   INGUINAL HERNIA REPAIR Right 2007   KNEE ARTHROSCOPY W/ MENISCECTOMY Right 06/25/2022   ROBOT ASSISTED LAPAROSCOPIC RADICAL PROSTATECTOMY N/A 08/01/2023   Procedure: PROSTATECTOMY, RADICAL, ROBOT-ASSISTED, LAPAROSCOPIC;  Surgeon: Renda Glance, MD;  [facility];  Service: Urology;  Laterality: N/A;  LEVEL 3   ROTATOR CUFF REPAIR Right 2014   SUBTHALAMIC STIMULATOR BATTERY REPLACEMENT N/A 01/21/2020   Procedure: Deep brain stimulator battery replacement;  Surgeon: Unice Pac, MD;  Location: [facility];  Service: Neurosurgery;  Laterality: N/A;   TONSILLECTOMY AND ADENOIDECTOMY     age 18   TOTAL SHOULDER REPLACEMENT Left 2016   TRANSURETHRAL RESECTION OF PROSTATE N/A 08/09/2020    Procedure: TRANSURETHRAL RESECTION OF THE PROSTATE (TURP), BLADDER BIOPSY AND FULGERATION;  Surgeon: Elisabeth Valli BIRCH, MD;  Location: [facility];  Service: Urology;  Laterality: N/A;   TRANSURETHRAL RESECTION OF PROSTATE N/A 08/29/2021   Procedure: RE-TRANSURETHRAL RESECTION OF THE PROSTATE (TURP);  Surgeon: Elisabeth Valli BIRCH, MD;  [facility];  Service: Urology;  Laterality: N/A;   VEIN SURGERY Left 2006   varicose veins    Family History:  Family History  Problem Relation Age of Onset   COPD Mother    Asthma Father    Rectal cancer Sister    Healthy Child    Urticaria Neg Hx    Immunodeficiency Neg Hx    Eczema Neg Hx    Atopy Neg Hx    Angioedema Neg Hx    Allergic rhinitis Neg Hx    Colon cancer Neg Hx    Stomach cancer Neg Hx    Esophageal cancer Neg Hx     Social History:  reports that he quit smoking about 35 years ago. His smoking use included cigarettes. He started smoking about 55 years ago. He has a 60 pack-year smoking history. He has never been exposed to tobacco smoke. He has never used smokeless tobacco. He reports current alcohol use of about 1.0 standard drink of alcohol per week. He reports that he does not use drugs.  Allergies: Allergies[1]  Medications: I have reviewed the patient's current medications.  Labs: Pertinent lab work personally reviewed.  Imaging: Pertinent imaging personally reviewed  Physical Exam Blood pressure 133/72, pulse 70, temperature 97.7 F (36.5 C), temperature source Oral, resp. rate 16, height 5' 10 (1.778 m), weight 99.8 kg, SpO2 97%. General: No acute distress, well appearing HEENT: PERRL, hearing grossly normal, mucous membranes moist CV: Regular rate and rhythm Pulm: Normal work of breathing on room air Abd: Soft, ~5x5cm reducible incisional hernia without overlying skin changes Extremities: Warm and well perfused Neuro: A&O x4, no focal neurologic deficits Psych: Appropriate mood and effect      Assessment   Allen [name] is an 74 y.o. male with incisional hernia  Plan  - Proceed to OR for robotic assisted laparoscopic incisional hernia repair with mesh. - We discussed the risks of operation including but not limited to: bleeding, deep and superficial surgical site infections including abscess, injury to surrounding structures with need for repair, reoperation, recurrence of hernia, possible need for conversion to open, seroma, hematoma. All questions were answered. Patient voiced understanding and agreed to proceed with surgery.    Orie Silversmith, MD General Surgery, Surgical Critical Care and Trauma       [1]  Allergies Allergen Reactions   Copper-Containing Compounds     Copper gloves caused redness and blisters bilateral hands   "

## 2024-02-06 NOTE — Anesthesia Postprocedure Evaluation (Addendum)
"   Anesthesia Post Note  Patient: Allen [name]  Procedure(s) Performed: REPAIR, HERNIA, UMBILICAL, ROBOT-ASSISTED (Abdomen)     Patient location during evaluation: Endoscopy Anesthesia Type: General Level of consciousness: sedated and patient cooperative Pain management: pain level controlled Vital Signs Assessment: post-procedure vital signs reviewed and stable Respiratory status: spontaneous breathing Cardiovascular status: stable Anesthetic complications: no   No notable events documented.  Last Vitals:  Vitals:   02/06/24 1130 02/06/24 1200  BP: (!) 97/53 124/62  Pulse: 62 74  Resp: 12 (!) 22  Temp:    SpO2: 91% 99%    Last Pain:  Vitals:   02/06/24 1213  TempSrc:   PainSc: 6                  Romy Mcgue      "

## 2024-02-06 NOTE — Transfer of Care (Signed)
 Immediate Anesthesia Transfer of Care Note  Patient: Allen [name]  Procedure(s) Performed: REPAIR, HERNIA, UMBILICAL, ROBOT-ASSISTED (Abdomen)  Patient Location: PACU  Anesthesia Type:General  Level of Consciousness: awake, alert , oriented, and patient cooperative  Airway & Oxygen  Therapy: Patient Spontanous Breathing and Patient connected to face mask oxygen   Post-op Assessment: Report given to RN and Post -op Vital signs reviewed and stable  Post vital signs: Reviewed and stable  Last Vitals:  Vitals Value Taken Time  BP 128/66 02/06/24 10:33  Temp 36.6 C 02/06/24 10:33  Pulse 59 02/06/24 10:39  Resp 10 02/06/24 10:39  SpO2 99 % 02/06/24 10:39  Vitals shown include unfiled device data.  Last Pain:  Vitals:   02/06/24 1033  TempSrc:   PainSc: 8          Complications: No notable events documented.

## 2024-02-06 NOTE — Discharge Summary (Signed)
 [facility] Discharge Summary   Patient ID: Allen [name] MRN: [id number] DOB/AGE: 1950-04-16 74 y.o.  Admit date: 02/06/2024 Discharge date: 02/07/2024  Admitting Diagnosis: Incisional hernia  Discharge Diagnosis Patient Active Problem List   Diagnosis Date Noted   Incisional hernia 02/06/2024   Prostate cancer (HCC) 08/01/2023   Cervical spondylosis 05/29/2022   Mild obstructive sleep apnea 05/29/2022   Effusion of left knee 04/18/2022   Degenerative arthritis of left knee 03/28/2022   Hoarseness 01/18/2022   Laryngopharyngeal reflux (LPR) 01/18/2022   MCI (mild cognitive impairment) with memory loss 09/29/2021   Chronic respiratory failure with hypoxia (HCC) 09/01/2020   BPH with obstruction/lower urinary tract symptoms 08/09/2020   S/P deep brain stimulator placement 03/25/2020   Tremor 03/25/2020   Pulmonary nodule less than 6 mm in diameter with high risk for malignant neoplasm 03/09/2020   Injury of axilla 03/03/2020   Body mass index (BMI) 32.0-32.9, adult 02/22/2020   Cervical radiculopathy 02/22/2020   Elevated blood-pressure reading, without diagnosis of hypertension 02/22/2020   Neck pain 02/22/2020   Greater trochanteric bursitis of right hip 11/18/2019   Degenerative disc disease, cervical 07/01/2019   Degenerative disc disease, lumbar 07/01/2019   Nonallopathic lesion of lumbar region 07/01/2019   Varicose veins of both lower extremities 06/23/2019   Chronic diastolic CHF (congestive heart failure) (HCC) 06/02/2019   Perennial allergic rhinitis probable nonallergic component 06/01/2019   Cough, persistent 06/01/2019   Vitamin D  deficiency 05/07/2019   Moderate persistent asthma without complication 04/10/2019   Allergic rhinitis 04/10/2019   Shortness of breath 04/10/2019   GERD (gastroesophageal reflux disease)    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(HCC)    Right carpal tunnel syndrome    Essential tremor    Insomnia    ED (erectile dysfunction)     Ulcerative colitis (HCC) 06/30/2014    Consultants None  Imaging: No results found.  Procedures 02/06/24 Robotic assisted laparoscopic incisional hernia repair with mesh  Hospital Course:  Patient is a 74 year old male who was taken to the OR on 02/06/24 for robotic assisted laparoscopic incisional hernia repair. Intraoperatively noted to have 5x5cm defect with partially incarcerated omentum. Postoperatively patient was admitted for observation due to pulmonary history and recommendations from his pulmonologist. He was started on a regular diet. By time of discharge they were ambulating, tolerating PO, and having bowel function.   Physical Exam: General:  Alert, NAD, pleasant, comfortable Abd:  Soft, ND, appropriately tender, incisions clean, dry and intact.  I or a member of my team have reviewed this patient in the Controlled Substance Database.   Allergies as of 02/07/2024       Reactions   Copper-containing Compounds    Copper gloves caused redness and blisters bilateral hands        Medication List     TAKE these medications    albuterol  108 (90 Base) MCG/ACT inhaler Commonly known as: VENTOLIN  HFA USE 1 TO 2 INHALATIONS BY MOUTH  EVERY 4 HOURS AS NEEDED FOR  WHEEZING OR SHORTNESS OF BREATH   albuterol  (2.5 MG/3ML) 0.083% nebulizer solution Commonly known as: PROVENTIL  Take 3 mLs (2.5 mg total) by nebulization every 6 (six) hours as needed for wheezing or shortness of breath.   Azelastine -Fluticasone  137-50 MCG/ACT Susp Place 1 spray into the nose in the morning and at bedtime. What changed:  when to take this reasons to take this   busPIRone  30 MG tablet Commonly known as: BUSPAR  Take 30 mg by mouth at bedtime as  needed.   clonazePAM 0.5 MG tablet Commonly known as: KLONOPIN Take 0.5 mg by mouth daily as needed.   escitalopram  20 MG tablet Commonly known as: LEXAPRO  Take 20 mg by mouth at bedtime.   FISH OIL PO Take 1 capsule by mouth daily.    lidocaine  5 % Commonly known as: Lidoderm  Place 1 patch onto the skin daily. Remove & Discard patch within 12 hours or as directed by MD   methocarbamol  500 MG tablet Commonly known as: ROBAXIN  Take 1 tablet (500 mg total) by mouth 2 (two) times daily.   montelukast  10 MG tablet Commonly known as: SINGULAIR  Take 1 tablet (10 mg total) by mouth at bedtime.   MULTIVITAMIN ADULT PO Take 1 tablet by mouth daily.   mupirocin ointment 2 % Commonly known as: BACTROBAN Apply 1 Application topically 3 (three) times daily as needed (irritation).   oxyCODONE  5 MG immediate release tablet Commonly known as: Roxicodone  Take 1 tablet (5 mg total) by mouth every 4 (four) hours as needed for severe pain (pain score 7-10). What changed: reasons to take this   pantoprazole  40 MG tablet Commonly known as: PROTONIX  Take 1 tablet (40 mg total) by mouth daily.   primidone  50 MG tablet Commonly known as: MYSOLINE  Take 50 mg by mouth. 3 tablets in the morning and 2 in the evening   QC TUMERIC COMPLEX PO Take 1 tablet by mouth daily.   rosuvastatin  5 MG tablet Commonly known as: CRESTOR  Take 1 tablet (5 mg total) by mouth daily. What changed: when to take this   tadalafil  20 MG tablet Commonly known as: CIALIS  Take 1 tablet (20 mg total) by mouth daily. What changed: how much to take   TIRZEPATIDE  [place] Inject 10 mcg into the skin once a week.   traZODone  100 MG tablet Commonly known as: DESYREL  TAKE 1 TABLET AT BEDTIME What changed: how much to take   Trelegy Ellipta  200-62.5-25 MCG/ACT Aepb Generic drug: Fluticasone -Umeclidin-Vilant Inhale 1 puff into the lungs daily.   triamcinolone  0.025 % cream Commonly known as: KENALOG  Apply 1 Application topically 2 (two) times daily as needed (irritation).   VITAMIN B12 PO Take 1 tablet by mouth daily.   VITAMIN D  PO Take 1 capsule by mouth daily.   zaleplon 10 MG capsule Commonly known as: SONATA Take 10 mg by mouth at bedtime.    Zepbound  12.5 MG/0.5ML Soln Generic drug: Tirzepatide -Weight Management Inject 12.5 mg into the skin once a week.           Signed: Orie Silversmith, MD General Surgery, Surgical Critical Care and Trauma 02/07/2024, 8:46 AM

## 2024-02-06 NOTE — Op Note (Addendum)
 02/06/2024 7:06 AM  PATIENT: Allen [name]  74 y.o. male  Patient Care Team: Theophilus Andrews, Tully GRADE, MD as PCP - General (Internal Medicine) Lonni Slain, MD as PCP - Cardiology (Cardiology) Tat, Asberry RAMAN, DO as Consulting Physician (Neurology) Liane Sharyne MATSU, [facility] (Inactive) as Pharmacist (Pharmacist) Livingston Rigg, MD as Consulting Physician (Dermatology)  PRE-OPERATIVE DIAGNOSIS: incisional hernia  POST-OPERATIVE DIAGNOSIS: incisional hernia  PROCEDURE: robotic assisted laparoscopic incisional hernia repair  SURGEON: Orie Silversmith, MD  ASSISTANT: Tonja Shaper, PA-C  ANESTHESIA: General endotracheal  EBL: 15cc  DRAINS: None  SPECIMEN: None  COUNTS: Sponge, needle and instrument counts were reported correct x2 at the conclusion of the operation  DISPOSITION: PACU in satisfactory condition  COMPLICATIONS: None  FINDINGS: 5x5cm incisional hernia. 15.2cm ventralight mesh placed in intraperitoneal onlay mesh fashion.  DESCRIPTION:  The patient was identified & brought into the operating room. He was then positioned supine on the OR table. SCDs were in place and active during the entire case. He then underwent general endotracheal anesthesia. Pressure points were padded. Hair on the abdomen was clipped by the OR team. The abdomen was prepped and draped in the standard sterile fashion. Antibiotics were administered. A surgical timeout was performed and confirmed our plan.  A small incision was made in the LUQ at Palmer's point and a veress needle was inserted. Air was aspirated and subsequent positive drop test. Abdomen then insufflated to . Three left lateral port sites were marked out to allow for adequate room and visualization.  An  incision to the mid port site location was made and an 8mm trocar optiview using a 0 degree scope was inserted and the abdomen was entered under direct visualization. Inspection confirmed no evidence of trocar or veress site  complications. The veress was then removed.   Two additional trocars were placed under direct visualization, one 8mm in the LLQ and 8mm in the LUQ. The patient was then positioned in Trendelenburg with slight right side down.  Falciform fat was taken down creating a flap.  Fascial defect was then closed primarily using 2-0 strattifix suture, incorporating some of the hernia sac to close down the dead space about the fascia. At this point selected 6 inch (15.2cm) ventralight mesh which was introduced into abdomen. Ensured coated side facing down, the mesh was placed over the defect and evenly distributed so there was about a 5cm radius overlap from defect to edge of mesh. The mesh was then secured in placed with several 2-0 v lock suture. The falciform flap created previously was then secured back over the superior portion of the mesh usign interrupted 3-0 vicryl suture. The needles were then removed. Operative field appeared hemostatic.   Abdomen was then desufflated and robot undocked and ports removed. 0.25% marcaine  with epi was injected into port sites. Port sites closed with 4-0 monocryl and dermabond.    Instrument, sponge, and needle counts were correct at closure and at the conclusion of the case.   Orie Silversmith, MD [facility]

## 2024-02-06 NOTE — Plan of Care (Signed)
  Problem: Education: Goal: Knowledge of the procedure and recovery process will improve Outcome: Progressing   Problem: Bowel/Gastric: Goal: Gastrointestinal status for postoperative course will improve Outcome: Progressing   Problem: Skin Integrity: Goal: Demonstration of wound healing without infection will improve Outcome: Progressing

## 2024-02-06 NOTE — Anesthesia Procedure Notes (Signed)
 Procedure Name: Intubation Date/Time: 02/06/2024 7:39 AM  Performed by: Franchot Delon RAMAN, CRNAPre-anesthesia Checklist: Patient identified, Emergency Drugs available, Suction available and Patient being monitored Patient Re-evaluated:Patient Re-evaluated prior to induction Oxygen  Delivery Method: Circle System Utilized Preoxygenation: Pre-oxygenation with 100% oxygen  Induction Type: IV induction Ventilation: Mask ventilation without difficulty Laryngoscope Size: Glidescope and 4 Grade View: Grade I Tube type: Oral Tube size: 7.5 mm Number of attempts: 1 Airway Equipment and Method: Stylet Placement Confirmation: ETT inserted through vocal cords under direct vision, positive ETCO2 and breath sounds checked- equal and bilateral Secured at: 22 cm Tube secured with: Tape Dental Injury: Teeth and Oropharynx as per pre-operative assessment  Comments: Intubation performed by Alm Shine, Paramedic, under supervision of Franchot, CRNA and Darlyn, MD. Gentle, easy intubation, + etco2.

## 2024-02-07 ENCOUNTER — Other Ambulatory Visit (HOSPITAL_COMMUNITY): Payer: Self-pay

## 2024-02-07 DIAGNOSIS — K432 Incisional hernia without obstruction or gangrene: Secondary | ICD-10-CM | POA: Diagnosis not present

## 2024-02-07 LAB — BASIC METABOLIC PANEL WITH GFR
Anion gap: 6 (ref 5–15)
BUN: 19 mg/dL (ref 8–23)
CO2: 26 mmol/L (ref 22–32)
Calcium: 8.7 mg/dL — ABNORMAL LOW (ref 8.9–10.3)
Chloride: 106 mmol/L (ref 98–111)
Creatinine, Ser: 1.01 mg/dL (ref 0.61–1.24)
GFR, Estimated: >60 mL/min
Glucose, Bld: 101 mg/dL — ABNORMAL HIGH (ref 70–99)
Potassium: 4.2 mmol/L (ref 3.5–5.1)
Sodium: 139 mmol/L (ref 135–145)

## 2024-02-07 LAB — PHOSPHORUS: Phosphorus: 3.6 mg/dL (ref 2.5–4.6)

## 2024-02-07 LAB — CBC
HCT: 30.9 % — ABNORMAL LOW (ref 39.0–52.0)
Hemoglobin: 10.2 g/dL — ABNORMAL LOW (ref 13.0–17.0)
MCH: 32.2 pg (ref 26.0–34.0)
MCHC: 33 g/dL (ref 30.0–36.0)
MCV: 97.5 fL (ref 80.0–100.0)
Platelets: 234 K/uL (ref 150–400)
RBC: 3.17 MIL/uL — ABNORMAL LOW (ref 4.22–5.81)
RDW: 15.5 % (ref 11.5–15.5)
WBC: 9.1 K/uL (ref 4.0–10.5)
nRBC: 0 % (ref 0.0–0.2)

## 2024-02-07 LAB — MAGNESIUM: Magnesium: 2.3 mg/dL (ref 1.7–2.4)

## 2024-02-07 MED ORDER — OXYCODONE HCL 5 MG PO TABS
5.0000 mg | ORAL_TABLET | ORAL | 0 refills | Status: AC | PRN
  Filled 2024-02-07: qty 15, 3d supply, fill #0

## 2024-02-07 NOTE — Plan of Care (Signed)
" °  Problem: Education: Goal: Knowledge of the procedure and recovery process will improve Outcome: Adequate for Discharge   Problem: Bowel/Gastric: Goal: Gastrointestinal status for postoperative course will improve Outcome: Adequate for Discharge   Problem: Pain Management: Goal: General experience of comfort will improve Outcome: Adequate for Discharge   Problem: Skin Integrity: Goal: Demonstration of wound healing without infection will improve Outcome: Adequate for Discharge   Problem: Urinary Elimination: Goal: Ability to avoid or minimize complications of infection will improve Outcome: Adequate for Discharge Goal: Ability to achieve and maintain urine output will improve Outcome: Adequate for Discharge Goal: Home care management will improve Outcome: Adequate for Discharge   Problem: Education: Goal: Knowledge of General Education information will improve Description: Including pain rating scale, medication(s)/side effects and non-pharmacologic comfort measures Outcome: Adequate for Discharge   Problem: Health Behavior/Discharge Planning: Goal: Ability to manage health-related needs will improve Outcome: Adequate for Discharge   Problem: Clinical Measurements: Goal: Ability to maintain clinical measurements within normal limits will improve Outcome: Adequate for Discharge Goal: Will remain free from infection Outcome: Adequate for Discharge Goal: Diagnostic test results will improve Outcome: Adequate for Discharge Goal: Respiratory complications will improve Outcome: Adequate for Discharge Goal: Cardiovascular complication will be avoided Outcome: Adequate for Discharge   Problem: Activity: Goal: Risk for activity intolerance will decrease Outcome: Adequate for Discharge   Problem: Nutrition: Goal: Adequate nutrition will be maintained Outcome: Adequate for Discharge   Problem: Coping: Goal: Level of anxiety will decrease Outcome: Adequate for Discharge    Problem: Elimination: Goal: Will not experience complications related to bowel motility Outcome: Adequate for Discharge Goal: Will not experience complications related to urinary retention Outcome: Adequate for Discharge   Problem: Pain Managment: Goal: General experience of comfort will improve and/or be controlled Outcome: Adequate for Discharge   Problem: Safety: Goal: Ability to remain free from injury will improve Outcome: Adequate for Discharge   Problem: Skin Integrity: Goal: Risk for impaired skin integrity will decrease Outcome: Adequate for Discharge   Problem: Education: Goal: Knowledge of the prescribed therapeutic regimen will improve Outcome: Adequate for Discharge   Problem: Bowel/Gastric: Goal: Gastrointestinal status for postoperative course will improve Outcome: Adequate for Discharge   Problem: Cardiac: Goal: Ability to maintain an adequate cardiac output Outcome: Adequate for Discharge Goal: Will show no evidence of cardiac arrhythmias Outcome: Adequate for Discharge   Problem: Nutritional: Goal: Will attain and maintain optimal nutritional status Outcome: Adequate for Discharge   Problem: Neurological: Goal: Will regain or maintain usual level of consciousness Outcome: Adequate for Discharge   Problem: Clinical Measurements: Goal: Ability to maintain clinical measurements within normal limits Outcome: Adequate for Discharge Goal: Postoperative complications will be avoided or minimized Outcome: Adequate for Discharge   Problem: Respiratory: Goal: Will regain and/or maintain adequate ventilation Outcome: Adequate for Discharge Goal: Respiratory status will improve Outcome: Adequate for Discharge   Problem: Skin Integrity: Goal: Demonstrates signs of wound healing without infection Outcome: Adequate for Discharge   Problem: Urinary Elimination: Goal: Will remain free from infection Outcome: Adequate for Discharge Goal: Ability to achieve  and maintain adequate urine output Outcome: Adequate for Discharge   "

## 2024-02-07 NOTE — Progress Notes (Signed)
" °   02/07/24 0902  TOC Brief Assessment  Insurance and Status Reviewed  Patient has primary care physician Yes  Home environment has been reviewed Home with spouse  Prior level of function: Independent with ADL's  Prior/Current Home Services No current home services  Social Drivers of Health Review SDOH reviewed no interventions necessary  Readmission risk has been reviewed Yes  Transition of care needs no transition of care needs at this time    "

## 2024-02-07 NOTE — Plan of Care (Signed)
" °  Problem: Skin Integrity: Goal: Demonstration of wound healing without infection will improve Outcome: Progressing   Problem: Urinary Elimination: Goal: Ability to achieve and maintain urine output will improve Outcome: Progressing   Problem: Clinical Measurements: Goal: Respiratory complications will improve Outcome: Progressing Goal: Cardiovascular complication will be avoided Outcome: Progressing   Problem: Activity: Goal: Risk for activity intolerance will decrease Outcome: Progressing   Problem: Pain Managment: Goal: General experience of comfort will improve and/or be controlled Outcome: Progressing   "

## 2024-02-08 ENCOUNTER — Other Ambulatory Visit (HOSPITAL_COMMUNITY): Payer: Self-pay

## 2024-02-10 ENCOUNTER — Emergency Department (HOSPITAL_BASED_OUTPATIENT_CLINIC_OR_DEPARTMENT_OTHER)

## 2024-02-10 ENCOUNTER — Other Ambulatory Visit: Payer: Self-pay

## 2024-02-10 ENCOUNTER — Encounter (HOSPITAL_BASED_OUTPATIENT_CLINIC_OR_DEPARTMENT_OTHER): Payer: Self-pay

## 2024-02-10 ENCOUNTER — Emergency Department (HOSPITAL_BASED_OUTPATIENT_CLINIC_OR_DEPARTMENT_OTHER)
Admission: EM | Admit: 2024-02-10 | Discharge: 2024-02-10 | Disposition: A | Discharge status: Home / Self Care | Source: Ambulatory Visit | Attending: Emergency Medicine | Admitting: Emergency Medicine

## 2024-02-10 DIAGNOSIS — J449 Chronic obstructive pulmonary disease, unspecified: Secondary | ICD-10-CM | POA: Insufficient documentation

## 2024-02-10 DIAGNOSIS — G8918 Other acute postprocedural pain: Secondary | ICD-10-CM | POA: Insufficient documentation

## 2024-02-10 DIAGNOSIS — K5900 Constipation, unspecified: Secondary | ICD-10-CM | POA: Diagnosis not present

## 2024-02-10 DIAGNOSIS — R1033 Periumbilical pain: Secondary | ICD-10-CM | POA: Diagnosis present

## 2024-02-10 LAB — CBC WITH DIFFERENTIAL/PLATELET
Abs Immature Granulocytes: 0.05 10*3/uL (ref 0.00–0.07)
Basophils Absolute: 0 10*3/uL (ref 0.0–0.1)
Basophils Relative: 1 %
Eosinophils Absolute: 0.3 10*3/uL (ref 0.0–0.5)
Eosinophils Relative: 3 %
HCT: 35 % — ABNORMAL LOW (ref 39.0–52.0)
Hemoglobin: 11.6 g/dL — ABNORMAL LOW (ref 13.0–17.0)
Immature Granulocytes: 1 %
Lymphocytes Relative: 11 %
Lymphs Abs: 1 10*3/uL (ref 0.7–4.0)
MCH: 31.6 pg (ref 26.0–34.0)
MCHC: 33.1 g/dL (ref 30.0–36.0)
MCV: 95.4 fL (ref 80.0–100.0)
Monocytes Absolute: 0.9 10*3/uL (ref 0.1–1.0)
Monocytes Relative: 10 %
Neutro Abs: 6.6 10*3/uL (ref 1.7–7.7)
Neutrophils Relative %: 74 %
Platelets: 274 10*3/uL (ref 150–400)
RBC: 3.67 MIL/uL — ABNORMAL LOW (ref 4.22–5.81)
RDW: 15.2 % (ref 11.5–15.5)
WBC: 8.8 10*3/uL (ref 4.0–10.5)
nRBC: 0 % (ref 0.0–0.2)

## 2024-02-10 LAB — COMPREHENSIVE METABOLIC PANEL WITH GFR
ALT: 12 U/L (ref 0–44)
AST: 15 U/L (ref 15–41)
Albumin: 3.9 g/dL (ref 3.5–5.0)
Alkaline Phosphatase: 47 U/L (ref 38–126)
Anion gap: 11 (ref 5–15)
BUN: 13 mg/dL (ref 8–23)
CO2: 26 mmol/L (ref 22–32)
Calcium: 8.8 mg/dL — ABNORMAL LOW (ref 8.9–10.3)
Chloride: 100 mmol/L (ref 98–111)
Creatinine, Ser: 0.82 mg/dL (ref 0.61–1.24)
GFR, Estimated: >60 mL/min
Glucose, Bld: 95 mg/dL (ref 70–99)
Potassium: 4.6 mmol/L (ref 3.5–5.1)
Sodium: 137 mmol/L (ref 135–145)
Total Bilirubin: 0.5 mg/dL (ref 0.0–1.2)
Total Protein: 6.3 g/dL — ABNORMAL LOW (ref 6.5–8.1)

## 2024-02-10 LAB — URINALYSIS, ROUTINE W REFLEX MICROSCOPIC
Bilirubin Urine: NEGATIVE
Glucose, UA: NEGATIVE mg/dL
Hgb urine dipstick: NEGATIVE
Ketones, ur: NEGATIVE mg/dL
Leukocytes,Ua: NEGATIVE
Nitrite: NEGATIVE
Protein, ur: NEGATIVE mg/dL
Specific Gravity, Urine: 1.009 (ref 1.005–1.030)
pH: 7 (ref 5.0–8.0)

## 2024-02-10 LAB — LIPASE, BLOOD: Lipase: 11 U/L (ref 11–51)

## 2024-02-10 MED ORDER — POLYETHYLENE GLYCOL 3350 17 GM/SCOOP PO POWD: 17.0000 g | Freq: Two times a day (BID) | ORAL | 0 refills | Status: AC | PRN

## 2024-02-10 MED ORDER — GLYCERIN (ADULT) 2 G RE SUPP: 1.0000 | RECTAL | 0 refills | Status: AC | PRN

## 2024-02-10 MED ORDER — IOHEXOL 300 MG/ML  SOLN
100.0000 mL | Freq: Once | INTRAMUSCULAR | Status: AC | PRN
  Administered 2024-02-10: 100 mL via INTRAVENOUS

## 2024-02-10 MED ORDER — LACTULOSE 10 GM/15ML PO SOLN: 20.0000 g | Freq: Two times a day (BID) | ORAL | 0 refills | Status: AC | PRN

## 2024-02-10 NOTE — ED Triage Notes (Signed)
 Arrives ambulatory to the ED with complaints of worsening abdominal discomfort, constipation, and urinary retention for 2 days. Patient had  hernia surgery 4 days ago and he was referred her by his surgeon for further eval.

## 2024-02-10 NOTE — ED Notes (Signed)
 DC paperwork given and verbally understood.

## 2024-02-10 NOTE — ED Provider Notes (Signed)
 "  EMERGENCY DEPARTMENT AT [facility] Provider Note   CSN: [id number] Arrival date & time: 02/10/24  1247     Patient presents with: Post-op Problem, Constipation, Abdominal Pain, and Urinary Retention   Allen [name] is a 74 y.o. male.   Patient with history of COPD, GERD, BPH status post prostate surgery, ventral hernia repair on 02/06/2024 -- presents to the emergency department today for evaluation of worsening abdominal pain.  Patient reports not having a bowel movement since his surgery.  He also has significant abdominal tenderness.  He was prescribed oxycodone  but has not taken in the past 2 days because he does not want to worsen the constipation.  He has also had urination described as a trickling.  No fevers.  No vomiting.  No blood passage to the rectum.  States that he spoke with his surgery group, recommended that he come to the emergency department.       Prior to Admission medications  Medication Sig Start Date End Date Taking? Authorizing Provider  albuterol  (PROVENTIL ) (2.5 MG/3ML) 0.083% nebulizer solution Take 3 mLs (2.5 mg total) by nebulization every 6 (six) hours as needed for wheezing or shortness of breath. 01/02/24   Hope Almarie ORN, NP  albuterol  (VENTOLIN  HFA) 108 (90 Base) MCG/ACT inhaler USE 1 TO 2 INHALATIONS BY MOUTH  EVERY 4 HOURS AS NEEDED FOR  WHEEZING OR SHORTNESS OF BREATH 08/26/23   Hope Almarie ORN, NP  Azelastine -Fluticasone  137-50 MCG/ACT SUSP Place 1 spray into the nose in the morning and at bedtime. Patient taking differently: Place 1 spray into the nose 2 (two) times daily as needed. 10/30/22   Theophilus Andrews, Tully GRADE, MD  busPIRone  (BUSPAR ) 30 MG tablet Take 30 mg by mouth at bedtime as needed. Patient not taking: Reported on 01/31/2024    [provider]  clonazePAM (KLONOPIN) 0.5 MG tablet Take 0.5 mg by mouth daily as needed. Patient not taking: Reported on 01/31/2024    [provider]   Cyanocobalamin  (VITAMIN B12 PO) Take 1 tablet by mouth daily.    [provider]  escitalopram  (LEXAPRO ) 20 MG tablet Take 20 mg by mouth at bedtime.    [provider]  Fluticasone -Umeclidin-Vilant (TRELEGY ELLIPTA ) 200-62.5-25 MCG/ACT AEPB Inhale 1 puff into the lungs daily. 01/21/24   Hope Almarie ORN, NP  lidocaine  (LIDODERM ) 5 % Place 1 patch onto the skin daily. Remove & Discard patch within 12 hours or as directed by MD Patient not taking: Reported on 01/31/2024 11/18/23   Barrett, Warren SAILOR, PA-C  methocarbamol  (ROBAXIN ) 500 MG tablet Take 1 tablet (500 mg total) by mouth 2 (two) times daily. Patient not taking: Reported on 01/31/2024 11/18/23   Barrett, Warren SAILOR, PA-C  montelukast  (SINGULAIR ) 10 MG tablet Take 1 tablet (10 mg total) by mouth at bedtime. 01/21/24   Hope Almarie ORN, NP  Multiple Vitamin (MULTIVITAMIN ADULT PO) Take 1 tablet by mouth daily.    [provider]  mupirocin ointment (BACTROBAN) 2 % Apply 1 Application topically 3 (three) times daily as needed (irritation). Patient not taking: Reported on 01/31/2024    [provider]  Omega-3 Fatty Acids (FISH OIL PO) Take 1 capsule by mouth daily.    [provider]  oxyCODONE  (ROXICODONE ) 5 MG immediate release tablet Take 1 tablet (5 mg total) by mouth every 4 (four) hours as needed for severe pain (pain score 7-10). 02/07/24   Ann Fine, MD  pantoprazole  (PROTONIX ) 40 MG tablet Take 1 tablet (40  mg total) by mouth daily. Patient not taking: Reported on 01/31/2024 01/23/24   Theophilus Andrews, Tully GRADE, MD  primidone  (MYSOLINE ) 50 MG tablet Take 50 mg by mouth. 3 tablets in the morning and 2 in the evening    [provider]  rosuvastatin  (CRESTOR ) 5 MG tablet Take 1 tablet (5 mg total) by mouth daily. Patient taking differently: Take 5 mg by mouth at bedtime. 01/21/24   Lonni Slain, MD  tadalafil  (CIALIS ) 20 MG tablet Take 1 tablet (20 mg total) by mouth  daily. Patient taking differently: Take 10 mg by mouth daily. 11/06/23   Theophilus Andrews, Tully GRADE, MD  TIRZEPATIDE  [place] Inject 10 mcg into the skin once a week.    [provider]  Tirzepatide -Weight Management (ZEPBOUND ) 12.5 MG/0.5ML SOLN Inject 12.5 mg into the skin once a week. 02/03/24   Theophilus Andrews, Tully GRADE, MD  traZODone  (DESYREL ) 100 MG tablet TAKE 1 TABLET AT BEDTIME Patient taking differently: Take 150 mg by mouth at bedtime. 06/08/21   Theophilus Andrews, Tully GRADE, MD  triamcinolone  (KENALOG ) 0.025 % cream Apply 1 Application topically 2 (two) times daily as needed (irritation). Patient not taking: Reported on 01/31/2024    [provider]  Turmeric (QC TUMERIC COMPLEX PO) Take 1 tablet by mouth daily.    [provider]  VITAMIN D  PO Take 1 capsule by mouth daily.    [provider]  zaleplon (SONATA) 10 MG capsule Take 10 mg by mouth at bedtime.    [provider]    Allergies: Copper-containing compounds    Review of Systems  Updated Vital Signs BP (!) 125/102   Pulse (!) 101   Temp 98.8 F (37.1 C)   Resp 20   Ht 5' 10 (1.778 m)   Wt 99.8 kg   SpO2 97%   BMI 31.57 kg/m   Physical Exam Vitals and nursing note reviewed.  Constitutional:      General: He is not in acute distress.    Appearance: He is well-developed.  HENT:     Head: Normocephalic and atraumatic.  Eyes:     General:        Right eye: No discharge.        Left eye: No discharge.     Conjunctiva/sclera: Conjunctivae normal.  Cardiovascular:     Rate and Rhythm: Normal rate and regular rhythm.     Heart sounds: Normal heart sounds.  Pulmonary:     Effort: Pulmonary effort is normal.     Breath sounds: Normal breath sounds.  Abdominal:     Palpations: Abdomen is soft.     Tenderness: There is abdominal tenderness in the periumbilical area.     Comments: Healing postop scars and ecchymosis, no obvious signs of soft tissue infection.  Patient is  uncomfortable to light or deep palpation in this area.  Musculoskeletal:     Cervical back: Normal range of motion and neck supple.  Skin:    General: Skin is warm and dry.  Neurological:     Mental Status: He is alert.     (all labs ordered are listed, but only abnormal results are displayed) Labs Reviewed  CBC WITH DIFFERENTIAL/PLATELET - Abnormal; Notable for the following components:      Result Value   RBC 3.67 (*)    Hemoglobin 11.6 (*)    HCT 35.0 (*)    All other components within normal limits  COMPREHENSIVE METABOLIC PANEL WITH GFR - Abnormal; Notable for the  following components:   Calcium  8.8 (*)    Total Protein 6.3 (*)    All other components within normal limits  LIPASE, BLOOD  URINALYSIS, ROUTINE W REFLEX MICROSCOPIC    EKG: None  Radiology: CT ABDOMEN PELVIS W CONTRAST Result Date: 02/10/2024 CLINICAL DATA:  Abdominal pain, acute, nonlocalized worsening postop pain, urinary retention. EXAM: CT ABDOMEN AND PELVIS WITH CONTRAST TECHNIQUE: Multidetector CT imaging of the abdomen and pelvis was performed using the standard protocol following bolus administration of intravenous contrast. RADIATION DOSE REDUCTION: This exam was performed according to the departmental dose-optimization program which includes automated exposure control, adjustment of the mA and/or kV according to patient size and/or use of iterative reconstruction technique. CONTRAST:  OMNIPAQUE  IOHEXOL  300 MG/ML  SOLN COMPARISON:  CT scan abdomen and pelvis from 01/14/2024. FINDINGS: Lower chest: There is small right pleural effusion with associated atelectatic changes, similar to the prior study. No left pleural effusion. Normal heart size. No pericardial effusion. Hepatobiliary: The liver is normal in size. Non-cirrhotic configuration. No suspicious mass. These is mild diffuse hepatic steatosis. No intrahepatic or extrahepatic bile duct dilation. No calcified gallstones. Normal gallbladder wall  thickness. No pericholecystic inflammatory changes. Pancreas: Unremarkable. No pancreatic ductal dilatation or surrounding inflammatory changes. Spleen: Within normal limits. No focal lesion. Adrenals/Urinary Tract: Adrenal glands are unremarkable. No suspicious renal mass. No hydronephrosis. No renal or ureteric calculi. Unremarkable partially distended urinary bladder. Stomach/Bowel: No disproportionate dilation of the small or large bowel loops. No evidence of abnormal bowel wall thickening or inflammatory changes. The appendix is surgically absent. There are scattered diverticula mainly in the sigmoid colon, without imaging signs of diverticulitis. Vascular/Lymphatic: No ascites or walled-off abscess. Small amount of pneumoperitoneum, most likely related to surgery. No abdominal or pelvic lymphadenopathy, by size criteria. No aneurysmal dilation of the major abdominal arteries. There are mild peripheral atherosclerotic vascular calcifications of the aorta and its major branches. Reproductive: Patient is status post surgical removal of prostate and bilateral seminal vesicles. Other: Since the prior study, patient underwent incisional hernia repair on 02/06/2024. There is a new linear hyperdense structure in the periumbilical region, likely related to hernia mesh. There is small amount of non walled-off fluid around the hernia mesh, favoring postoperative seroma. However, there are 2 new small fluid containing right paramedian ventral hernia (marked with electronic arrow sign on series 6). The slightly bigger hernia seen on series 3, image 59 exhibit fluid-fluid level, suggesting small amount of probable hemorrhage within. Musculoskeletal: No suspicious osseous lesions. There are mild - moderate multilevel degenerative changes in the visualized spine. Subacute/healing fracture of posteromedial right tenth rib noted. IMPRESSION: 1. Since the prior study, patient underwent incisional hernia repair. There is a new  linear hyperdense structure in the periumbilical region, likely related to hernia mesh. There is small amount of non walled-off fluid around the hernia mesh, favoring postoperative seroma. However, there are 2 new small fluid containing right paramedian ventral hernia. The slightly bigger hernia exhibits fluid-fluid level, suggesting small amount of probable hemorrhage within. 2. Small stable right pleural effusion.  No left pleural effusion. 3. Urinary bladder is partially distended. 4. Otherwise essentially unremarkable exam. Aortic Atherosclerosis (ICD10-I70.0). Electronically Signed   By: Ree Molt M.D.   On: 02/10/2024 15:26     Procedures   Medications Ordered in the ED  iohexol  (OMNIPAQUE ) 300 MG/ML solution 100 mL (100 mLs Intravenous Contrast Given 02/10/24 1455)    ED Course  Patient seen and examined. History obtained directly from patient.  Labs/EKG: Ordered CBC, CMP, lipase, UA  Imaging: Ordered CT abdomen pelvis, bladder scan.  Medications/Fluids: Patient currently declining pain medication  Most recent vital signs reviewed and are as follows: BP (!) 125/102   Pulse (!) 101   Temp 98.8 F (37.1 C)   Resp 20   Ht 5' 10 (1.778 m)   Wt 99.8 kg   SpO2 97%   BMI 31.57 kg/m   Initial impression: Postoperative pain, constipation, urinary retention.  Patient discussed with Dr. Yolande who has seen.   5:09 PM Reassessment performed. Patient appears stable.   Labs personally reviewed and interpreted including: CBC with normal white blood cell count, mild anemia; CMP unremarkable; lipase normal; UA without signs of infection.  Imaging personally visualized and interpreted including: CT of the abdomen pelvis without signs of bowel obstruction show several fluid collections.  Bladder does not suggest urinary retention.  I discussed case with general surgery Osborne PA-C.  We agree that labs are reassuring.  They would expect the fluid collections seen on CT at this  point.  We have overall low concern for abscess.  No concerns about going home.  We discussed postoperative treatment of constipation.  No treatments that would be contraindicated.  Reviewed pertinent lab work and imaging with patient at bedside. Questions answered.   Most current vital signs reviewed and are as follows: BP 135/70 (BP Location: Right Arm)   Pulse 79   Temp 98.8 F (37.1 C)   Resp 16   Ht 5' 10 (1.778 m)   Wt 99.8 kg   SpO2 99%   BMI 31.57 kg/m   Plan: Discharge to home.   Prescriptions written for: MiraLAX , lactulose , glycerin  suppository  Other home care instructions discussed: Discussed hydration, use of pain medications as needed.  ED return instructions discussed: The patient was urged to return to the Emergency Department immediately with worsening of current symptoms, worsening abdominal pain, persistent vomiting, blood noted in stools, fever, or any other concerns. The patient verbalized understanding.   Follow-up instructions discussed: Patient encouraged to follow-up with their PCP in 5 days and general surgeon as planned.                                 Medical Decision Making Amount and/or Complexity of Data Reviewed Labs: ordered. Radiology: ordered.  Risk OTC drugs. Prescription drug management.   For this patient's complaint of abdominal pain, the following conditions were considered on the differential diagnosis: gastritis/PUD, enteritis/duodenitis, appendicitis, cholelithiasis/cholecystitis, cholangitis, pancreatitis, ruptured viscus, colitis, diverticulitis, small/large bowel obstruction, proctitis, cystitis, pyelonephritis, ureteral colic, aortic dissection, aortic aneurysm. Atypical chest etiologies were also considered including ACS, PE, and pneumonia.  Patient postop, CT was ordered to evaluate for any postoperative complications.  This showed fluid collections which were discussed with general surgery.  They feel that these collections  are likely seromas versus hematomas, expected for this stage of healing.  No indication for emergent surgical evaluation.  Patient's main concern is constipation.  We will continue to treat this aggressively.  Also discussed with general surgery.  No evidence of bowel obstruction on CT.  Patient with also some difficulty starting urinary stream.  Bladder scan with 250 cc.  Patient is able to urinate.  Do not suspect acute urinary retention at this time.  Normal renal function.  Patient will need close outpatient follow-up but workup is reassuring today.  The patient's vital signs, pertinent lab work and imaging  were reviewed and interpreted as discussed in the ED course. Hospitalization was considered for further testing, treatments, or serial exams/observation. However as patient is well-appearing, has a stable exam, and reassuring studies today, I do not feel that they warrant admission at this time. This plan was discussed with the patient who verbalizes agreement and comfort with this plan and seems reliable and able to return to the Emergency Department with worsening or changing symptoms.        Final diagnoses:  Postoperative abdominal pain  Constipation, unspecified constipation type    ED Discharge Orders          Ordered    polyethylene glycol powder (GLYCOLAX /MIRALAX ) 17 GM/SCOOP powder  2 times daily PRN        02/10/24 1604    lactulose  (CHRONULAC ) 10 GM/15ML solution  2 times daily PRN        02/10/24 1604    glycerin  adult 2 g suppository  As needed        02/10/24 1604               Desiderio Chew, [place] 02/10/24 1731  "

## 2024-02-10 NOTE — Discharge Instructions (Addendum)
 Please read and follow all provided instructions.  Your diagnoses today include:  1. Postoperative abdominal pain   2. Constipation, unspecified constipation type     Tests performed today include: Complete blood cell count: Normal white blood cell count, mild anemia Complete metabolic panel: No concerning findings Lipase (pancreas function test): Was normal Urinalysis (urine test): No sign of urinary tract infection CT scan of the abdomen and pelvis: Shows postoperative changes with associated fluid collection/hematoma, I reviewed these with general surgery who states that this is expected postop, low concern for abscess or infection at this time.  You do have moderate amount of stool in the abdomen.  Your bladder does not appear to be significantly distended. Vital signs. See below for your results today.   Medications prescribed:  Miralax  - laxative  This medication can be found over-the-counter.   Lactulose  - additional medication for constipation  Glycerin  suppository -suppository to use for constipation  Take any prescribed medications only as directed.  Home care instructions:  Follow any educational materials contained in this packet.  Follow-up instructions: Please follow-up with your primary care provider in 5 days with persistent symptoms or with your general surgeon as planned.  Return instructions:  SEEK IMMEDIATE MEDICAL ATTENTION IF: The pain does not go away or becomes severe  A temperature above 101F develops  Repeated vomiting occurs (multiple episodes)  The pain becomes localized to portions of the abdomen. The right side could possibly be appendicitis. In an adult, the left lower portion of the abdomen could be colitis or diverticulitis.  Blood is being passed in stools or vomit (bright red or black tarry stools)  You develop chest pain, difficulty breathing, dizziness or fainting, or become confused, poorly responsive, or inconsolable (young children) If  you have any other emergent concerns regarding your health  Additional Information: Abdominal (belly) pain can be caused by many things. Your caregiver performed an examination and possibly ordered blood/urine tests and imaging (CT scan, x-rays, ultrasound). Many cases can be observed and treated at home after initial evaluation in the emergency department. Even though you are being discharged home, abdominal pain can be unpredictable. Therefore, you need a repeated exam if your pain does not resolve, returns, or worsens. Most patients with abdominal pain don't have to be admitted to the hospital or have surgery, but serious problems like appendicitis and gallbladder attacks can start out as nonspecific pain. Many abdominal conditions cannot be diagnosed in one visit, so follow-up evaluations are very important.  Your vital signs today were: BP 133/81 (BP Location: Right Arm)   Pulse 81   Temp 98.8 F (37.1 C)   Resp 18   Ht 5' 10 (1.778 m)   Wt 99.8 kg   SpO2 95%   BMI 31.57 kg/m  If your blood pressure (bp) was elevated above 135/85 this visit, please have this repeated by your doctor within one month. --------------

## 2024-02-11 ENCOUNTER — Telehealth: Payer: Self-pay | Admitting: *Deleted

## 2024-02-11 NOTE — Telephone Encounter (Signed)
 Patient is aware.

## 2024-02-11 NOTE — Telephone Encounter (Signed)
 Copied from CRM [id number]. Topic: Clinical - Prescription Issue >> Feb 11, 2024  9:17 AM [name] wrote: Reason for CRM: Patient called and went to the ER on yesterday and his medicine  Glycerin  (Laxative) 2 g 1 suppository Rectal As needed Lactulose  20 g Oral 2 times daily PRN Polyethylene Glycol 3350  17 g Oral 2 times daily PRN was sent to [facility] but they are not open and the patient is wanting the RX's sent to another pharmacy [id number] [name], [place] - [address] AT [facility] [address] [place] [address] Phone: [phone] Fax: [phone] Hours: Not open 24 hours  Could you assist? Patients callback number is [phone].

## 2024-02-18 ENCOUNTER — Other Ambulatory Visit: Payer: Self-pay | Admitting: Internal Medicine

## 2024-02-18 ENCOUNTER — Encounter: Payer: Self-pay | Admitting: Internal Medicine

## 2024-02-18 DIAGNOSIS — E66811 Obesity, class 1: Secondary | ICD-10-CM

## 2024-02-18 MED ORDER — TIRZEPATIDE-WEIGHT MANAGEMENT 15 MG/0.5ML ~~LOC~~ SOAJ: 15.0000 mg | SUBCUTANEOUS | 0 refills | Status: AC

## 2024-02-19 ENCOUNTER — Other Ambulatory Visit: Payer: Self-pay | Admitting: Internal Medicine

## 2024-02-19 DIAGNOSIS — E6609 Other obesity due to excess calories: Secondary | ICD-10-CM

## 2024-02-19 MED ORDER — ZEPBOUND 15 MG/0.5ML ~~LOC~~ SOLN: 15.0000 mg | SUBCUTANEOUS | 2 refills | Status: AC

## 2024-02-20 ENCOUNTER — Other Ambulatory Visit: Payer: Self-pay | Admitting: Internal Medicine

## 2024-02-20 DIAGNOSIS — E6609 Other obesity due to excess calories: Secondary | ICD-10-CM

## 2024-03-23 ENCOUNTER — Ambulatory Visit

## 2026-09-09 ENCOUNTER — Encounter: Payer: Self-pay | Admitting: Internal Medicine
